# Patient Record
Sex: Female | Born: 1965 | Race: White | Hispanic: No | Marital: Single | Smoking: Current every day smoker
Health system: Southern US, Community
[De-identification: ages and names within clinical notes are randomized; demographics above are authoritative.]

## PROBLEM LIST (undated history)

## (undated) DIAGNOSIS — N390 Urinary tract infection, site not specified: Secondary | ICD-10-CM

## (undated) DIAGNOSIS — Z87442 Personal history of urinary calculi: Secondary | ICD-10-CM

## (undated) DIAGNOSIS — F411 Generalized anxiety disorder: Secondary | ICD-10-CM

## (undated) DIAGNOSIS — R112 Nausea with vomiting, unspecified: Secondary | ICD-10-CM

## (undated) DIAGNOSIS — E1140 Type 2 diabetes mellitus with diabetic neuropathy, unspecified: Secondary | ICD-10-CM

## (undated) DIAGNOSIS — G459 Transient cerebral ischemic attack, unspecified: Secondary | ICD-10-CM

## (undated) DIAGNOSIS — L439 Lichen planus, unspecified: Secondary | ICD-10-CM

## (undated) DIAGNOSIS — N189 Chronic kidney disease, unspecified: Secondary | ICD-10-CM

## (undated) DIAGNOSIS — N760 Acute vaginitis: Secondary | ICD-10-CM

## (undated) DIAGNOSIS — IMO0001 Reserved for inherently not codable concepts without codable children: Secondary | ICD-10-CM

## (undated) DIAGNOSIS — J189 Pneumonia, unspecified organism: Secondary | ICD-10-CM

## (undated) DIAGNOSIS — M25559 Pain in unspecified hip: Secondary | ICD-10-CM

## (undated) DIAGNOSIS — E1165 Type 2 diabetes mellitus with hyperglycemia: Secondary | ICD-10-CM

## (undated) DIAGNOSIS — E785 Hyperlipidemia, unspecified: Secondary | ICD-10-CM

## (undated) DIAGNOSIS — I639 Cerebral infarction, unspecified: Secondary | ICD-10-CM

## (undated) DIAGNOSIS — G629 Polyneuropathy, unspecified: Secondary | ICD-10-CM

## (undated) DIAGNOSIS — N183 Chronic kidney disease, stage 3 unspecified: Secondary | ICD-10-CM

## (undated) DIAGNOSIS — R51 Headache: Secondary | ICD-10-CM

## (undated) DIAGNOSIS — A6000 Herpesviral infection of urogenital system, unspecified: Secondary | ICD-10-CM

## (undated) DIAGNOSIS — G709 Myoneural disorder, unspecified: Secondary | ICD-10-CM

## (undated) DIAGNOSIS — Z9889 Other specified postprocedural states: Secondary | ICD-10-CM

## (undated) HISTORY — DX: Urinary tract infection, site not specified: N39.0

## (undated) HISTORY — DX: Chronic kidney disease, stage 3 unspecified: N18.30

## (undated) HISTORY — DX: Type 2 diabetes mellitus with hyperglycemia: E11.65

## (undated) HISTORY — DX: Lichen planus, unspecified: L43.9

## (undated) HISTORY — DX: Polyneuropathy, unspecified: G62.9

## (undated) HISTORY — DX: Hyperlipidemia, unspecified: E78.5

## (undated) HISTORY — PX: COLONOSCOPY: SHX174

## (undated) HISTORY — DX: Pain in unspecified hip: M25.559

## (undated) HISTORY — PX: VASCULAR SURGERY: SHX849

## (undated) HISTORY — PX: ABDOMINAL HYSTERECTOMY: SHX81

## (undated) HISTORY — DX: Headache: R51

## (undated) HISTORY — DX: Acute vaginitis: N76.0

## (undated) HISTORY — PX: EYE SURGERY: SHX253

## (undated) HISTORY — DX: Herpesviral infection of urogenital system, unspecified: A60.00

## (undated) HISTORY — DX: Cerebral infarction, unspecified: I63.9

## (undated) HISTORY — DX: Transient cerebral ischemic attack, unspecified: G45.9

## (undated) HISTORY — DX: Chronic kidney disease, unspecified: N18.9

## (undated) HISTORY — DX: Generalized anxiety disorder: F41.1

## (undated) HISTORY — DX: Reserved for inherently not codable concepts without codable children: IMO0001

---
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
## 1989-07-19 HISTORY — PX: CHOLECYSTECTOMY: SHX55

## 1989-07-19 HISTORY — PX: TUBAL LIGATION: SHX77

## 1999-07-20 HISTORY — PX: TOTAL VAGINAL HYSTERECTOMY: SHX2548

## 2005-03-25 ENCOUNTER — Ambulatory Visit: Payer: Self-pay | Admitting: Anesthesiology

## 2005-11-04 ENCOUNTER — Emergency Department: Payer: Self-pay | Admitting: Emergency Medicine

## 2009-11-26 ENCOUNTER — Emergency Department: Payer: Self-pay | Admitting: Emergency Medicine

## 2011-02-16 ENCOUNTER — Emergency Department: Payer: Self-pay | Admitting: Emergency Medicine

## 2011-08-27 ENCOUNTER — Emergency Department: Payer: Self-pay | Admitting: Internal Medicine

## 2012-06-22 ENCOUNTER — Emergency Department: Payer: Self-pay | Admitting: Internal Medicine

## 2012-07-04 ENCOUNTER — Emergency Department: Payer: Self-pay | Admitting: Emergency Medicine

## 2012-07-04 LAB — RAPID INFLUENZA A&B ANTIGENS

## 2012-07-06 ENCOUNTER — Emergency Department: Payer: Self-pay | Admitting: Emergency Medicine

## 2012-07-06 LAB — CBC
HGB: 14.9 g/dL (ref 12.0–16.0)
MCH: 31.4 pg (ref 26.0–34.0)
MCHC: 34.6 g/dL (ref 32.0–36.0)
MCV: 91 fL (ref 80–100)
RBC: 4.74 10*6/uL (ref 3.80–5.20)

## 2012-07-06 LAB — BASIC METABOLIC PANEL
Anion Gap: 4 — ABNORMAL LOW (ref 7–16)
Co2: 27 mmol/L (ref 21–32)
Creatinine: 0.94 mg/dL (ref 0.60–1.30)
EGFR (African American): >60
EGFR (Non-African Amer.): >60
Osmolality: 283 (ref 275–301)
Sodium: 139 mmol/L (ref 136–145)

## 2012-11-21 ENCOUNTER — Ambulatory Visit: Payer: Self-pay | Admitting: Family Medicine

## 2012-12-22 ENCOUNTER — Emergency Department: Payer: Self-pay | Admitting: Emergency Medicine

## 2012-12-22 LAB — COMPREHENSIVE METABOLIC PANEL
Albumin: 3.4 g/dL (ref 3.4–5.0)
Anion Gap: 5 — ABNORMAL LOW (ref 7–16)
BUN: 15 mg/dL (ref 7–18)
Bilirubin,Total: 0.4 mg/dL (ref 0.2–1.0)
Calcium, Total: 8.7 mg/dL (ref 8.5–10.1)
Co2: 27 mmol/L (ref 21–32)
Creatinine: 1.12 mg/dL (ref 0.60–1.30)
Glucose: 358 mg/dL — ABNORMAL HIGH (ref 65–99)
Osmolality: 283 (ref 275–301)
Potassium: 3.8 mmol/L (ref 3.5–5.1)
SGOT(AST): 16 U/L (ref 15–37)
SGPT (ALT): 21 U/L (ref 12–78)
Total Protein: 7.9 g/dL (ref 6.4–8.2)

## 2012-12-22 LAB — CBC
HCT: 42.9 % (ref 35.0–47.0)
HGB: 15 g/dL (ref 12.0–16.0)
MCHC: 35.1 g/dL (ref 32.0–36.0)
MCV: 91 fL (ref 80–100)
Platelet: 220 10*3/uL (ref 150–440)
RDW: 12.5 % (ref 11.5–14.5)
WBC: 10.7 10*3/uL (ref 3.6–11.0)

## 2012-12-25 ENCOUNTER — Emergency Department: Payer: Self-pay | Admitting: Emergency Medicine

## 2013-02-06 ENCOUNTER — Ambulatory Visit (INDEPENDENT_AMBULATORY_CARE_PROVIDER_SITE_OTHER): Payer: BC Managed Care – PPO | Admitting: Neurology

## 2013-02-06 ENCOUNTER — Encounter: Payer: Self-pay | Admitting: Neurology

## 2013-02-06 VITALS — BP 126/76 | HR 98 | Temp 97.6°F | Ht 63.5 in | Wt 178.0 lb

## 2013-02-06 DIAGNOSIS — R51 Headache: Secondary | ICD-10-CM

## 2013-02-06 DIAGNOSIS — R0609 Other forms of dyspnea: Secondary | ICD-10-CM

## 2013-02-06 DIAGNOSIS — R519 Headache, unspecified: Secondary | ICD-10-CM | POA: Insufficient documentation

## 2013-02-06 DIAGNOSIS — R400 Somnolence: Secondary | ICD-10-CM

## 2013-02-06 DIAGNOSIS — G4710 Hypersomnia, unspecified: Secondary | ICD-10-CM

## 2013-02-06 DIAGNOSIS — R0683 Snoring: Secondary | ICD-10-CM

## 2013-02-06 DIAGNOSIS — G43009 Migraine without aura, not intractable, without status migrainosus: Secondary | ICD-10-CM

## 2013-02-06 MED ORDER — AMITRIPTYLINE HCL 25 MG PO TABS: ORAL_TABLET | ORAL | Status: DC

## 2013-02-06 MED ORDER — PROMETHAZINE HCL 12.5 MG PO TABS: 12.5000 mg | ORAL_TABLET | Freq: Two times a day (BID) | ORAL | Status: DC | PRN

## 2013-02-06 NOTE — Patient Instructions (Signed)
Based on your symptoms and your exam I believe you are at risk for obstructive sleep apnea or OSA, and I think we should proceed with a sleep study to determine whether you do or do not have OSA and how severe it is. If you have more than mild OSA, I want you to consider treatment with CPAP. Please remember, the risks and ramifications of moderate to severe obstructive sleep apnea or OSA are: Cardiovascular disease, including congestive heart failure, stroke, difficult to control hypertension, arrhythmias, and even type 2 diabetes has been linked to untreated OSA. Sleep apnea causes disruption of sleep and sleep deprivation in most cases, which, in turn, can cause recurrent headaches, problems with memory, mood, concentration, focus, and vigilance. Most people with untreated sleep apnea report excessive daytime sleepiness, which can affect their ability to drive. Please do not drive if you feel sleepy.  I will see you back after your sleep study to go over the test results and where to go from there. We will call you after your sleep study and to set up an appointment at the time.   Please remember, common headache triggers are: sleep deprivation, dehydration, overheating, stress, hypoglycemia or skipping meals, excessive pain medications or excessive alcohol use. Some people have food triggers such as aged cheese, orange juice or chocolate, especially dark chocolate. Try to avoid these headache triggers as much possible. It may be helpful to keep a headache diary to figure out what makes your headaches worse or brings them on. Some people report headache onset after exercise but studies have shown that regular exercise may actually prevent headaches from coming on. If you have exercise-induced headaches, please make sure that you drink plenty of fluid before and after exercising and that you don't over do it.   We will try Elavil (amitriptyline) 25 mg: Take half a pill daily at bedtime for one week, then one  pill daily at bedtime for one week, then one and a half pills daily at bedtime for one week, then 2 pills daily at bedtime thereafter. Common side effects reported are: mouth dryness, drowsiness, confusion, dizziness.   For nausea you can take the Phenergan.
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
## 2013-02-11 ENCOUNTER — Encounter: Payer: BC Managed Care – PPO | Admitting: Neurology

## 2013-02-12 ENCOUNTER — Telehealth: Payer: Self-pay | Admitting: Neurology
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
## 2013-03-08 ENCOUNTER — Telehealth: Payer: Self-pay

## 2013-03-08 NOTE — Telephone Encounter (Signed)
I have completed the document request for [facility] and have left the patient a VM that we need a release and payment then we can fax the forms. We do have a [employer] release but not a [employer]. I will send documents up to medical records and they can fax them when they hear back from patient.

## 2013-04-20 ENCOUNTER — Emergency Department: Payer: Self-pay | Admitting: Emergency Medicine

## 2013-05-16 ENCOUNTER — Ambulatory Visit: Payer: Self-pay | Admitting: Family Medicine

## 2013-06-12 ENCOUNTER — Ambulatory Visit: Payer: Self-pay | Admitting: Family Medicine

## 2013-07-31 ENCOUNTER — Emergency Department: Payer: Self-pay | Admitting: Emergency Medicine

## 2014-01-14 ENCOUNTER — Ambulatory Visit: Payer: Self-pay

## 2014-05-28 ENCOUNTER — Emergency Department: Payer: Self-pay | Admitting: Emergency Medicine

## 2014-05-28 LAB — CBC WITH DIFFERENTIAL/PLATELET
Basophil #: 0.1 10*3/uL (ref 0.0–0.1)
Basophil %: 0.6 %
EOS ABS: 0.3 10*3/uL (ref 0.0–0.7)
Eosinophil %: 2.7 %
HCT: 45.4 % (ref 35.0–47.0)
HGB: 15.3 g/dL (ref 12.0–16.0)
LYMPHS PCT: 23.2 %
Lymphocyte #: 2.7 10*3/uL (ref 1.0–3.6)
MCH: 31.6 pg (ref 26.0–34.0)
MCHC: 33.7 g/dL (ref 32.0–36.0)
MCV: 94 fL (ref 80–100)
Monocyte #: 0.7 x10 3/mm (ref 0.2–0.9)
Monocyte %: 6.5 %
Neutrophil #: 7.7 10*3/uL — ABNORMAL HIGH (ref 1.4–6.5)
Neutrophil %: 67 %
Platelet: 246 10*3/uL (ref 150–440)
RBC: 4.84 10*6/uL (ref 3.80–5.20)
RDW: 12.7 % (ref 11.5–14.5)
WBC: 11.5 10*3/uL — ABNORMAL HIGH (ref 3.6–11.0)

## 2014-05-28 LAB — URINALYSIS, COMPLETE
BLOOD: NEGATIVE
Bilirubin,UR: NEGATIVE
Glucose,UR: >=500 mg/dL (ref 0–75)
KETONE: NEGATIVE
Nitrite: POSITIVE
PH: 5 (ref 4.5–8.0)
PROTEIN: NEGATIVE
RBC,UR: 2 /HPF (ref 0–5)
Specific Gravity: 1.03 (ref 1.003–1.030)
Squamous Epithelial: 5
WBC UR: 12 /HPF (ref 0–5)

## 2014-05-29 LAB — COMPREHENSIVE METABOLIC PANEL
ALK PHOS: 121 U/L — AB
ANION GAP: 6 — AB (ref 7–16)
Albumin: 3.4 g/dL (ref 3.4–5.0)
BUN: 9 mg/dL (ref 7–18)
Bilirubin,Total: 0.4 mg/dL (ref 0.2–1.0)
CALCIUM: 8.8 mg/dL (ref 8.5–10.1)
CHLORIDE: 102 mmol/L (ref 98–107)
CO2: 32 mmol/L (ref 21–32)
Creatinine: 1.12 mg/dL (ref 0.60–1.30)
EGFR (African American): >60
EGFR (Non-African Amer.): 55 — ABNORMAL LOW
Glucose: 293 mg/dL — ABNORMAL HIGH (ref 65–99)
Osmolality: 289 (ref 275–301)
POTASSIUM: 3.9 mmol/L (ref 3.5–5.1)
SGOT(AST): 7 U/L — ABNORMAL LOW (ref 15–37)
SGPT (ALT): 20 U/L
SODIUM: 140 mmol/L (ref 136–145)
Total Protein: 8 g/dL (ref 6.4–8.2)

## 2014-11-02 ENCOUNTER — Inpatient Hospital Stay
Admit: 2014-11-02 | Disposition: A | Discharge status: Home / Self Care | Payer: Self-pay | Attending: Internal Medicine | Admitting: Internal Medicine

## 2014-11-02 LAB — BASIC METABOLIC PANEL
Anion Gap: 8 (ref 7–16)
BUN: 17 mg/dL
CREATININE: 0.93 mg/dL
Calcium, Total: 9 mg/dL
Chloride: 100 mmol/L — ABNORMAL LOW
Co2: 28 mmol/L
EGFR (African American): >60
GLUCOSE: 406 mg/dL — AB
POTASSIUM: 4.4 mmol/L
SODIUM: 136 mmol/L

## 2014-11-02 LAB — CK-MB
CK-MB: 1.6 ng/mL
CK-MB: 1.9 ng/mL
CK-MB: 1.4 ng/mL

## 2014-11-02 LAB — TROPONIN I
Troponin-I: <0.03 ng/mL
Troponin-I: <0.03 ng/mL

## 2014-11-02 LAB — CBC
HCT: 41.7 % (ref 35.0–47.0)
HGB: 14.4 g/dL (ref 12.0–16.0)
MCH: 31.3 pg (ref 26.0–34.0)
MCHC: 34.6 g/dL (ref 32.0–36.0)
MCV: 90 fL (ref 80–100)
Platelet: 222 10*3/uL (ref 150–440)
RBC: 4.62 10*6/uL (ref 3.80–5.20)
RDW: 12.5 % (ref 11.5–14.5)
WBC: 9.8 10*3/uL (ref 3.6–11.0)

## 2014-11-02 LAB — URINALYSIS, COMPLETE
BLOOD: NEGATIVE
Bilirubin,UR: NEGATIVE
Ketone: NEGATIVE
LEUKOCYTE ESTERASE: NEGATIVE
NITRITE: POSITIVE
Ph: 5 (ref 4.5–8.0)
Protein: NEGATIVE
Specific Gravity: 1.027 (ref 1.003–1.030)

## 2014-11-02 LAB — SEDIMENTATION RATE: ERYTHROCYTE SED RATE: 55 mm/h — AB (ref 0–20)

## 2014-11-03 LAB — CBC WITH DIFFERENTIAL/PLATELET
Basophil #: 0.1 10*3/uL (ref 0.0–0.1)
Basophil %: 0.6 %
EOS PCT: 3.7 %
Eosinophil #: 0.4 10*3/uL (ref 0.0–0.7)
HCT: 39.3 % (ref 35.0–47.0)
HGB: 13.3 g/dL (ref 12.0–16.0)
LYMPHS ABS: 3.3 10*3/uL (ref 1.0–3.6)
Lymphocyte %: 33.6 %
MCH: 30.6 pg (ref 26.0–34.0)
MCHC: 33.9 g/dL (ref 32.0–36.0)
MCV: 90 fL (ref 80–100)
MONO ABS: 0.7 x10 3/mm (ref 0.2–0.9)
Monocyte %: 7.4 %
Neutrophil #: 5.3 10*3/uL (ref 1.4–6.5)
Neutrophil %: 54.7 %
PLATELETS: 213 10*3/uL (ref 150–440)
RBC: 4.36 10*6/uL (ref 3.80–5.20)
RDW: 12.3 % (ref 11.5–14.5)
WBC: 9.7 10*3/uL (ref 3.6–11.0)

## 2014-11-03 LAB — BASIC METABOLIC PANEL
Anion Gap: 2 — ABNORMAL LOW (ref 7–16)
BUN: 18 mg/dL
CO2: 29 mmol/L
CREATININE: 0.87 mg/dL
Calcium, Total: 8 mg/dL — ABNORMAL LOW
Chloride: 105 mmol/L
Glucose: 281 mg/dL — ABNORMAL HIGH
POTASSIUM: 3.9 mmol/L
Sodium: 136 mmol/L

## 2014-11-03 LAB — LIPID PANEL
CHOLESTEROL: 211 mg/dL — AB
HDL: 23 mg/dL — AB
TRIGLYCERIDES: 440 mg/dL — AB

## 2014-11-03 LAB — HEMOGLOBIN A1C: Hemoglobin A1C: 12 % — ABNORMAL HIGH
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
## 2015-10-24 ENCOUNTER — Encounter: Payer: Self-pay | Admitting: Emergency Medicine

## 2015-10-24 ENCOUNTER — Emergency Department
Admission: EM | Admit: 2015-10-24 | Discharge: 2015-10-24 | Disposition: A | Discharge status: Home / Self Care | Payer: Self-pay | Attending: Emergency Medicine | Admitting: Emergency Medicine

## 2015-10-24 DIAGNOSIS — Z794 Long term (current) use of insulin: Secondary | ICD-10-CM | POA: Insufficient documentation

## 2015-10-24 DIAGNOSIS — E119 Type 2 diabetes mellitus without complications: Secondary | ICD-10-CM | POA: Insufficient documentation

## 2015-10-24 DIAGNOSIS — S7001XA Contusion of right hip, initial encounter: Secondary | ICD-10-CM | POA: Insufficient documentation

## 2015-10-24 DIAGNOSIS — Z79899 Other long term (current) drug therapy: Secondary | ICD-10-CM | POA: Insufficient documentation

## 2015-10-24 DIAGNOSIS — H109 Unspecified conjunctivitis: Secondary | ICD-10-CM | POA: Insufficient documentation

## 2015-10-24 DIAGNOSIS — E785 Hyperlipidemia, unspecified: Secondary | ICD-10-CM | POA: Insufficient documentation

## 2015-10-24 DIAGNOSIS — Z7984 Long term (current) use of oral hypoglycemic drugs: Secondary | ICD-10-CM | POA: Insufficient documentation

## 2015-10-24 DIAGNOSIS — W108XXA Fall (on) (from) other stairs and steps, initial encounter: Secondary | ICD-10-CM | POA: Insufficient documentation

## 2015-10-24 DIAGNOSIS — F419 Anxiety disorder, unspecified: Secondary | ICD-10-CM | POA: Insufficient documentation

## 2015-10-24 DIAGNOSIS — Y9389 Activity, other specified: Secondary | ICD-10-CM | POA: Insufficient documentation

## 2015-10-24 DIAGNOSIS — Z87891 Personal history of nicotine dependence: Secondary | ICD-10-CM | POA: Insufficient documentation

## 2015-10-24 DIAGNOSIS — Y998 Other external cause status: Secondary | ICD-10-CM | POA: Insufficient documentation

## 2015-10-24 DIAGNOSIS — Y9289 Other specified places as the place of occurrence of the external cause: Secondary | ICD-10-CM | POA: Insufficient documentation

## 2015-10-24 DIAGNOSIS — W19XXXA Unspecified fall, initial encounter: Secondary | ICD-10-CM

## 2015-10-24 DIAGNOSIS — S40011A Contusion of right shoulder, initial encounter: Secondary | ICD-10-CM | POA: Insufficient documentation

## 2015-10-24 MED ORDER — NAPROXEN 500 MG PO TABS: 500.0000 mg | ORAL_TABLET | Freq: Two times a day (BID) | ORAL | Status: DC

## 2015-10-24 MED ORDER — SULFACETAMIDE SODIUM 10 % OP SOLN: 2.0000 [drp] | Freq: Four times a day (QID) | OPHTHALMIC | Status: DC

## 2015-10-24 MED ORDER — CYCLOBENZAPRINE HCL 10 MG PO TABS: 10.0000 mg | ORAL_TABLET | Freq: Three times a day (TID) | ORAL | Status: DC | PRN
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
## 2015-10-24 NOTE — ED Notes (Signed)
Pt in via triage with complaints of drainage from left eye for about three days, pt reports getting up last night and unable to open left eye, "tripped and stumbled down the stairs."  Pt denies hitting head, denies LOC.  Pt with complaints of pain to right shoulder, right hip, reports difficulty ambulating due to pain.  Pt left eye red, sensitive to light.

## 2015-10-24 NOTE — ED Notes (Signed)
Vision was blurry in left eye last night when pt woke up to bathroom, missed a step and fell on right shoulder/upper back and hip area. Pt walked to triage. Left eye red, "goopy" upon waking upon waking up this am.

## 2015-10-24 NOTE — Discharge Instructions (Signed)
Bacterial Conjunctivitis Bacterial conjunctivitis, commonly called pink eye, is an inflammation of the clear membrane that covers the white part of the eye (conjunctiva). The inflammation can also happen on the underside of the eyelids. The blood vessels in the conjunctiva become inflamed, causing the eye to become red or pink. Bacterial conjunctivitis may spread easily from one eye to another and from person to person (contagious).  CAUSES  Bacterial conjunctivitis is caused by bacteria. The bacteria may come from your own skin, your upper respiratory tract, or from someone else with bacterial conjunctivitis. SYMPTOMS  The normally white color of the eye or the underside of the eyelid is usually pink or red. The pink eye is usually associated with irritation, tearing, and some sensitivity to light. Bacterial conjunctivitis is often associated with a thick, yellowish discharge from the eye. The discharge may turn into a crust on the eyelids overnight, which causes your eyelids to stick together. If a discharge is present, there may also be some blurred vision in the affected eye. DIAGNOSIS  Bacterial conjunctivitis is diagnosed by your caregiver through an eye exam and the symptoms that you report. Your caregiver looks for changes in the surface tissues of your eyes, which may point to the specific type of conjunctivitis. A sample of any discharge may be collected on a cotton-tip swab if you have a severe case of conjunctivitis, if your cornea is affected, or if you keep getting repeat infections that do not respond to treatment. The sample will be sent to a lab to see if the inflammation is caused by a bacterial infection and to see if the infection will respond to antibiotic medicines. TREATMENT   Bacterial conjunctivitis is treated with antibiotics. Antibiotic eyedrops are most often used. However, antibiotic ointments are also available. Antibiotics pills are sometimes used. Artificial tears or eye  washes may ease discomfort. HOME CARE INSTRUCTIONS   To ease discomfort, apply a cool, clean washcloth to your eye for 10-20 minutes, 3-4 times a day.  Gently wipe away any drainage from your eye with a warm, wet washcloth or a cotton ball.  Wash your hands often with soap and water. Use paper towels to dry your hands.  Do not share towels or washcloths. This may spread the infection.  Change or wash your pillowcase every day.  You should not use eye makeup until the infection is gone.  Do not operate machinery or drive if your vision is blurred.  Stop using contact lenses. Ask your caregiver how to sterilize or replace your contacts before using them again. This depends on the type of contact lenses that you use.  When applying medicine to the infected eye, do not touch the edge of your eyelid with the eyedrop bottle or ointment tube. SEEK IMMEDIATE MEDICAL CARE IF:   Your infection has not improved within 3 days after beginning treatment.  You had yellow discharge from your eye and it returns.  You have increased eye pain.  Your eye redness is spreading.  Your vision becomes blurred.  You have a fever or persistent symptoms for more than 2-3 days.  You have a fever and your symptoms suddenly get worse.  You have facial pain, redness, or swelling. MAKE SURE YOU:   Understand these instructions.  Will watch your condition.  Will get help right away if you are not doing well or get worse.   This information is not intended to replace advice given to you by your health care provider. Make sure you   discuss any questions you have with your health care provider.   Document Released: 07/05/2005 Document Revised: 07/26/2014 Document Reviewed: 12/06/2011 Elsevier Interactive Patient Education 2016 [place] A contusion is a deep bruise. Contusions are the result of a blunt injury to tissues and muscle fibers under the skin. The injury causes bleeding under  the skin. The skin overlying the contusion may turn blue, purple, or yellow. Minor injuries will give you a painless contusion, but more severe contusions may stay painful and swollen for a few weeks.  CAUSES  This condition is usually caused by a blow, trauma, or direct force to an area of the body. SYMPTOMS  Symptoms of this condition include:  Swelling of the injured area.  Pain and tenderness in the injured area.  Discoloration. The area may have redness and then turn blue, purple, or yellow. DIAGNOSIS  This condition is diagnosed based on a physical exam and medical history. An X-ray, CT scan, or MRI may be needed to determine if there are any associated injuries, such as broken bones (fractures). TREATMENT  Specific treatment for this condition depends on what area of the body was injured. In general, the best treatment for a contusion is resting, icing, applying pressure to (compression), and elevating the injured area. This is often called the RICE strategy. Over-the-counter anti-inflammatory medicines may also be recommended for pain control.  HOME CARE INSTRUCTIONS   Rest the injured area.  If directed, apply ice to the injured area:  Put ice in a plastic bag.  Place a towel between your skin and the bag.  Leave the ice on for 20 minutes, 2-3 times per day.  If directed, apply light compression to the injured area using an elastic bandage. Make sure the bandage is not wrapped too tightly. Remove and reapply the bandage as directed by your health care provider.  If possible, raise (elevate) the injured area above the level of your heart while you are sitting or lying down.  Take over-the-counter and prescription medicines only as told by your health care provider. SEEK MEDICAL CARE IF:  Your symptoms do not improve after several days of treatment.  Your symptoms get worse.  You have difficulty moving the injured area. SEEK IMMEDIATE MEDICAL CARE IF:   You have severe  pain.  You have numbness in a hand or foot.  Your hand or foot turns pale or cold.   This information is not intended to replace advice given to you by your health care provider. Make sure you discuss any questions you have with your health care provider.   Document Released: 04/14/2005 Document Revised: 03/26/2015 Document Reviewed: 11/20/2014 Elsevier Interactive Patient Education 2016 [employer]  Iliac Crest Contusion  An iliac crest contusion is a deep bruise of your hip bone (hip pointer). Contusions happen when an injury causes bleeding under the skin. Signs of bruising include pain, puffiness (swelling), and discolored skin. The contusion may turn blue, purple, or yellow. HOME CARE   Put ice on the injured area.  Put ice in a plastic bag.  Place a towel between your skin and the bag.  Leave the ice on for 15-20 minutes, 03-04 times a day.  Only take medicines as told by your doctor.  Keep your leg straight (extended) when possible.  Walk and move around as pain allows, or as told by your doctor. Use crutches if you are told to do so.  Put on an elastic wrap as told by your doctor. You  can take it off for sleeping, showers, and baths. GET HELP RIGHT AWAY IF:  You have more bruising or puffiness.  You have pain that is getting worse.  Your puffiness or pain is not helped by medicines.  Your toes get cold. MAKE SURE YOU:   Understand these instructions.  Will watch your condition.  Will get help right away if you are not doing well or get worse.   This information is not intended to replace advice given to you by your health care provider. Make sure you discuss any questions you have with your health care provider.   Document Released: 06/24/2011 Document Revised: 01/04/2012 Document Reviewed: 11/20/2014 Elsevier Interactive Patient Education [employer].

## 2015-12-21 ENCOUNTER — Emergency Department: Payer: Self-pay

## 2015-12-21 ENCOUNTER — Emergency Department
Admission: EM | Admit: 2015-12-21 | Discharge: 2015-12-21 | Disposition: A | Discharge status: Home / Self Care | Payer: Self-pay | Attending: Emergency Medicine | Admitting: Emergency Medicine

## 2015-12-21 ENCOUNTER — Encounter: Payer: Self-pay | Admitting: Emergency Medicine

## 2015-12-21 DIAGNOSIS — E784 Other hyperlipidemia: Secondary | ICD-10-CM | POA: Insufficient documentation

## 2015-12-21 DIAGNOSIS — IMO0002 Reserved for concepts with insufficient information to code with codable children: Secondary | ICD-10-CM

## 2015-12-21 DIAGNOSIS — Z79899 Other long term (current) drug therapy: Secondary | ICD-10-CM | POA: Insufficient documentation

## 2015-12-21 DIAGNOSIS — E119 Type 2 diabetes mellitus without complications: Secondary | ICD-10-CM | POA: Insufficient documentation

## 2015-12-21 DIAGNOSIS — E138 Other specified diabetes mellitus with unspecified complications: Secondary | ICD-10-CM

## 2015-12-21 DIAGNOSIS — Z87891 Personal history of nicotine dependence: Secondary | ICD-10-CM | POA: Insufficient documentation

## 2015-12-21 DIAGNOSIS — E1365 Other specified diabetes mellitus with hyperglycemia: Secondary | ICD-10-CM

## 2015-12-21 DIAGNOSIS — Z794 Long term (current) use of insulin: Secondary | ICD-10-CM | POA: Insufficient documentation

## 2015-12-21 DIAGNOSIS — Z7984 Long term (current) use of oral hypoglycemic drugs: Secondary | ICD-10-CM | POA: Insufficient documentation

## 2015-12-21 DIAGNOSIS — L03115 Cellulitis of right lower limb: Secondary | ICD-10-CM | POA: Insufficient documentation

## 2015-12-21 LAB — CBC WITH DIFFERENTIAL/PLATELET
BASOS ABS: 0.1 10*3/uL (ref 0–0.1)
Basophils Relative: 1 %
EOS ABS: 0.3 10*3/uL (ref 0–0.7)
HEMATOCRIT: 38.1 % (ref 35.0–47.0)
HEMOGLOBIN: 13.1 g/dL (ref 12.0–16.0)
LYMPHS ABS: 2.6 10*3/uL (ref 1.0–3.6)
Lymphocytes Relative: 22 %
MCH: 30.9 pg (ref 26.0–34.0)
MCHC: 34.6 g/dL (ref 32.0–36.0)
MCV: 89.5 fL (ref 80.0–100.0)
Monocytes Absolute: 0.8 10*3/uL (ref 0.2–0.9)
Monocytes Relative: 7 %
Neutro Abs: 8 10*3/uL — ABNORMAL HIGH (ref 1.4–6.5)
Platelets: 262 10*3/uL (ref 150–440)
RBC: 4.25 MIL/uL (ref 3.80–5.20)
RDW: 12.4 % (ref 11.5–14.5)
WBC: 11.8 10*3/uL — AB (ref 3.6–11.0)

## 2015-12-21 LAB — COMPREHENSIVE METABOLIC PANEL
ALT: 14 U/L (ref 14–54)
AST: 15 U/L (ref 15–41)
Albumin: 3.8 g/dL (ref 3.5–5.0)
Alkaline Phosphatase: 86 U/L (ref 38–126)
Anion gap: 8 (ref 5–15)
BILIRUBIN TOTAL: 0.4 mg/dL (ref 0.3–1.2)
BUN: 15 mg/dL (ref 6–20)
CHLORIDE: 101 mmol/L (ref 101–111)
CO2: 26 mmol/L (ref 22–32)
CREATININE: 0.97 mg/dL (ref 0.44–1.00)
Calcium: 9.2 mg/dL (ref 8.9–10.3)
GFR calc Af Amer: >60 mL/min (ref >60)
Glucose, Bld: 417 mg/dL — ABNORMAL HIGH (ref 65–99)
POTASSIUM: 4.2 mmol/L (ref 3.5–5.1)
Sodium: 135 mmol/L (ref 135–145)
TOTAL PROTEIN: 8 g/dL (ref 6.5–8.1)

## 2015-12-21 LAB — URIC ACID: URIC ACID, SERUM: 4.4 mg/dL (ref 2.3–6.6)

## 2015-12-21 LAB — GLUCOSE, CAPILLARY: Glucose-Capillary: 351 mg/dL — ABNORMAL HIGH (ref 65–99)

## 2015-12-21 MED ORDER — SODIUM CHLORIDE 0.9 % IV BOLUS (SEPSIS)
1000.0000 mL | Freq: Once | INTRAVENOUS | Status: AC
  Administered 2015-12-21: 1000 mL via INTRAVENOUS

## 2015-12-21 MED ORDER — IBUPROFEN 600 MG PO TABS: 600.0000 mg | ORAL_TABLET | Freq: Four times a day (QID) | ORAL | Status: DC | PRN

## 2015-12-21 MED ORDER — CLINDAMYCIN PHOSPHATE 600 MG/50ML IV SOLN
600.0000 mg | Freq: Once | INTRAVENOUS | Status: AC
  Administered 2015-12-21: 600 mg via INTRAVENOUS
  Filled 2015-12-21: qty 50

## 2015-12-21 MED ORDER — CLINDAMYCIN HCL 300 MG PO CAPS: 300.0000 mg | ORAL_CAPSULE | Freq: Three times a day (TID) | ORAL | Status: DC

## 2015-12-21 NOTE — Discharge Instructions (Signed)
Cellulitis Cellulitis is an infection of the skin and the tissue beneath it. The infected area is usually red and tender. Cellulitis occurs most often in the arms and lower legs.  CAUSES  Cellulitis is caused by bacteria that enter the skin through cracks or cuts in the skin. The most common types of bacteria that cause cellulitis are staphylococci and streptococci. SIGNS AND SYMPTOMS   Redness and warmth.  Swelling.  Tenderness or pain.  Fever. DIAGNOSIS  Your health care provider can usually determine what is wrong based on a physical exam. Blood tests may also be done. TREATMENT  Treatment usually involves taking an antibiotic medicine. HOME CARE INSTRUCTIONS   Take your antibiotic medicine as directed by your health care provider. Finish the antibiotic even if you start to feel better.  Keep the infected arm or leg elevated to reduce swelling.  Apply a warm cloth to the affected area up to 4 times per day to relieve pain.  Take medicines only as directed by your health care provider.  Keep all follow-up visits as directed by your health care provider. SEEK MEDICAL CARE IF:   You notice red streaks coming from the infected area.  Your red area gets larger or turns dark in color.  Your bone or joint underneath the infected area becomes painful after the skin has healed.  Your infection returns in the same area or another area.  You notice a swollen bump in the infected area.  You develop new symptoms.  You have a fever. SEEK IMMEDIATE MEDICAL CARE IF:   You feel very sleepy.  You develop vomiting or diarrhea.  You have a general ill feeling (malaise) with muscle aches and pains.   This information is not intended to replace advice given to you by your health care provider. Make sure you discuss any questions you have with your health care provider.   Document Released: 04/14/2005 Document Revised: 03/26/2015 Document Reviewed: 09/20/2011 Elsevier Interactive  Patient Education 2016 [employer].  Diabetes Mellitus and Food It is important for you to manage your blood sugar (glucose) level. Your blood glucose level can be greatly affected by what you eat. Eating healthier foods in the appropriate amounts throughout the day at about the same time each day will help you control your blood glucose level. It can also help slow or prevent worsening of your diabetes mellitus. Healthy eating may even help you improve the level of your blood pressure and reach or maintain a healthy weight.  General recommendations for healthful eating and cooking habits include:  Eating meals and snacks regularly. Avoid going long periods of time without eating to lose weight.  Eating a diet that consists mainly of plant-based foods, such as fruits, vegetables, nuts, legumes, and whole grains.  Using low-heat cooking methods, such as baking, instead of high-heat cooking methods, such as deep frying. Work with your dietitian to make sure you understand how to use the Nutrition Facts information on food labels. HOW CAN FOOD AFFECT ME? Carbohydrates Carbohydrates affect your blood glucose level more than any other type of food. Your dietitian will help you determine how many carbohydrates to eat at each meal and teach you how to count carbohydrates. Counting carbohydrates is important to keep your blood glucose at a healthy level, especially if you are using insulin or taking certain medicines for diabetes mellitus. Alcohol Alcohol can cause sudden decreases in blood glucose (hypoglycemia), especially if you use insulin or take certain medicines for diabetes mellitus. Hypoglycemia can  be a life-threatening condition. Symptoms of hypoglycemia (sleepiness, dizziness, and disorientation) are similar to symptoms of having too much alcohol.  If your health care provider has given you approval to drink alcohol, do so in moderation and use the following guidelines:  Women should not  have more than one drink per day, and men should not have more than two drinks per day. One drink is equal to:  12 oz of beer.  5 oz of wine.  1 oz of hard liquor.  Do not drink on an empty stomach.  Keep yourself hydrated. Have water, diet soda, or unsweetened iced tea.  Regular soda, juice, and other mixers might contain a lot of carbohydrates and should be counted. WHAT FOODS ARE NOT RECOMMENDED? As you make food choices, it is important to remember that all foods are not the same. Some foods have fewer nutrients per serving than other foods, even though they might have the same number of calories or carbohydrates. It is difficult to get your body what it needs when you eat foods with fewer nutrients. Examples of foods that you should avoid that are high in calories and carbohydrates but low in nutrients include:  Trans fats (most processed foods list trans fats on the Nutrition Facts label).  Regular soda.  Juice.  Candy.  Sweets, such as cake, pie, doughnuts, and cookies.  Fried foods. WHAT FOODS CAN I EAT? Eat nutrient-rich foods, which will nourish your body and keep you healthy. The food you should eat also will depend on several factors, including:  The calories you need.  The medicines you take.  Your weight.  Your blood glucose level.  Your blood pressure level.  Your cholesterol level. You should eat a variety of foods, including:  Protein.  Lean cuts of meat.  Proteins low in saturated fats, such as fish, egg whites, and beans. Avoid processed meats.  Fruits and vegetables.  Fruits and vegetables that may help control blood glucose levels, such as apples, mangoes, and yams.  Dairy products.  Choose fat-free or low-fat dairy products, such as milk, yogurt, and cheese.  Grains, bread, pasta, and rice.  Choose whole grain products, such as multigrain bread, whole oats, and brown rice. These foods may help control blood pressure.  Fats.  Foods  containing healthful fats, such as nuts, avocado, olive oil, canola oil, and fish. DOES EVERYONE WITH DIABETES MELLITUS HAVE THE SAME MEAL PLAN? Because every person with diabetes mellitus is different, there is not one meal plan that works for everyone. It is very important that you meet with a dietitian who will help you create a meal plan that is just right for you.   This information is not intended to replace advice given to you by your health care provider. Make sure you discuss any questions you have with your health care provider.   Document Released: 04/01/2005 Document Revised: 07/26/2014 Document Reviewed: 06/01/2013 Elsevier Interactive Patient Education 2016 [employer].  Diabetes and Sick Day Management Blood sugar (glucose) can be more difficult to control when you are sick. Colds, fever, flu, nausea, vomiting, and diarrhea are all examples of common illnesses that can cause problems for people with diabetes. Loss of body fluids (dehydration) from fever, vomiting, diarrhea, infection, and the stress of a sickness can all cause blood glucose levels to increase. Because of this, it is very important to take your diabetes medicines and to eat some form of carbohydrate food when you are sick. Liquid or soft foods are often  tolerated, and they help to replace fluids. HOME CARE INSTRUCTIONS These main guidelines are intended for managing a short-term (24 hours or less) sickness:  Take your usual dose of insulin or oral diabetes medicine. An exception would be if you take any form of metformin. If you cannot eat or drink, you can become dehydrated and should not take this medicine.  Continue to take your insulin even if you are unable to eat solid foods or are vomiting. Your insulin dose may stay the same, or it may need to be increased when you are sick.  You will need to test your blood glucose more often, generally every 2-4 hours. If you have type 1 diabetes, test your urine for  ketones every 4 hours. If you have type 2 diabetes, test your urine for ketones as directed by your health care provider.  Eat some form of food that contains carbohydrates. The carbohydrates can be in solid or liquid form. You should eat 45-50 g of carbohydrates every 3-4 hours.  Replace fluids if you have a fever, vomit, or have diarrhea. Ask your health care provider for specific rehydration instructions.  Watch carefully for the signs of ketoacidosis if you have type 1 diabetes. Call your health care provider if any of the following symptoms are present, especially in children:  Moderate to large ketones in the urine along with a high blood glucose level.  Severe nausea.  Vomiting.  Diarrhea.  Abdominal pain.  Rapid breathing.  Drink extra liquids that do not contain sugar such as water.  Be careful with over-the-counter medicines. Read the labels. They may contain sugar or types of sugars that can increase your blood glucose level. Food Choices for Illness All of the food choices below contain about 15 g of carbohydrates. Plan ahead and keep some of these foods around.    to  cup carbonated beverage containing sugar. Carbonated beverages will usually be better tolerated if they are opened and left at room temperature for a few minutes.   of a twin frozen ice pop.   cup regular gelatin.   cup juice.   cup ice cream or frozen yogurt.   cup cooked cereal.   cup sherbet.  1 cup clear broth or soup.  1 cup cream soup.   cup regular custard.   cup regular pudding.  1 cup sports drink.  1 cup plain yogurt.  1 slice toast.  6 squares saltine crackers.  5 vanilla wafers. SEEK MEDICAL CARE IF:   You are unable to drink fluids, even small amounts.  You have nausea and vomiting for more than 6 hours.  You have diarrhea for more than 6 hours.  Your blood glucose level is more than 240 mg/dL, even with additional insulin.  There is a change in  mental status.  You develop an additional serious sickness.  You have been sick for 2 days and are not getting better.  You have a fever. SEEK IMMEDIATE MEDICAL CARE IF:  You have difficulty breathing.  You have moderate to large ketone levels. MAKE SURE YOU:  Understand these instructions.  Will watch your condition.  Will get help right away if you are not doing well or get worse.   This information is not intended to replace advice given to you by your health care provider. Make sure you discuss any questions you have with your health care provider.   Document Released: 07/08/2003 Document Revised: 07/26/2014 Document Reviewed: 12/12/2012 Elsevier Interactive Patient Education [employer].

## 2015-12-21 NOTE — ED Notes (Signed)
See triage noted   Developed pain to right foot about 1-2 weeks ago  Pain started at 4 th and 5th toes and is moving into foot. Min swelling noted to foot  Denies any injry

## 2015-12-21 NOTE — ED Notes (Signed)
Pt states foot pain to right foot started 1 week ago started in toes moved up to foot.
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
## 2015-12-25 ENCOUNTER — Emergency Department: Payer: Self-pay

## 2015-12-25 ENCOUNTER — Emergency Department
Admission: EM | Admit: 2015-12-25 | Discharge: 2015-12-25 | Disposition: A | Discharge status: Home / Self Care | Payer: Self-pay | Attending: Emergency Medicine | Admitting: Emergency Medicine

## 2015-12-25 ENCOUNTER — Encounter: Payer: Self-pay | Admitting: *Deleted

## 2015-12-25 DIAGNOSIS — Y929 Unspecified place or not applicable: Secondary | ICD-10-CM | POA: Insufficient documentation

## 2015-12-25 DIAGNOSIS — Z7984 Long term (current) use of oral hypoglycemic drugs: Secondary | ICD-10-CM | POA: Insufficient documentation

## 2015-12-25 DIAGNOSIS — Y999 Unspecified external cause status: Secondary | ICD-10-CM | POA: Insufficient documentation

## 2015-12-25 DIAGNOSIS — S91301D Unspecified open wound, right foot, subsequent encounter: Secondary | ICD-10-CM | POA: Insufficient documentation

## 2015-12-25 DIAGNOSIS — E13628 Other specified diabetes mellitus with other skin complications: Secondary | ICD-10-CM | POA: Insufficient documentation

## 2015-12-25 DIAGNOSIS — X58XXXA Exposure to other specified factors, initial encounter: Secondary | ICD-10-CM | POA: Insufficient documentation

## 2015-12-25 DIAGNOSIS — Z794 Long term (current) use of insulin: Secondary | ICD-10-CM | POA: Insufficient documentation

## 2015-12-25 DIAGNOSIS — Z79899 Other long term (current) drug therapy: Secondary | ICD-10-CM | POA: Insufficient documentation

## 2015-12-25 DIAGNOSIS — Z87891 Personal history of nicotine dependence: Secondary | ICD-10-CM | POA: Insufficient documentation

## 2015-12-25 DIAGNOSIS — L03115 Cellulitis of right lower limb: Secondary | ICD-10-CM | POA: Insufficient documentation

## 2015-12-25 DIAGNOSIS — Y939 Activity, unspecified: Secondary | ICD-10-CM | POA: Insufficient documentation

## 2015-12-25 DIAGNOSIS — E785 Hyperlipidemia, unspecified: Secondary | ICD-10-CM | POA: Insufficient documentation

## 2015-12-25 LAB — CBC WITH DIFFERENTIAL/PLATELET
Basophils Absolute: 0.1 10*3/uL (ref 0–0.1)
Basophils Relative: 1 %
EOS PCT: 4 %
Eosinophils Absolute: 0.3 10*3/uL (ref 0–0.7)
HCT: 39.1 % (ref 35.0–47.0)
Hemoglobin: 13.5 g/dL (ref 12.0–16.0)
LYMPHS ABS: 2.5 10*3/uL (ref 1.0–3.6)
LYMPHS PCT: 27 %
MCH: 30.7 pg (ref 26.0–34.0)
MCHC: 34.6 g/dL (ref 32.0–36.0)
MCV: 88.9 fL (ref 80.0–100.0)
MONO ABS: 0.6 10*3/uL (ref 0.2–0.9)
MONOS PCT: 7 %
Neutro Abs: 5.9 10*3/uL (ref 1.4–6.5)
Neutrophils Relative %: 61 %
PLATELETS: 256 10*3/uL (ref 150–440)
RBC: 4.4 MIL/uL (ref 3.80–5.20)
RDW: 12.5 % (ref 11.5–14.5)
WBC: 9.4 10*3/uL (ref 3.6–11.0)

## 2015-12-25 LAB — COMPREHENSIVE METABOLIC PANEL
ALBUMIN: 3.7 g/dL (ref 3.5–5.0)
ALK PHOS: 85 U/L (ref 38–126)
ALT: 14 U/L (ref 14–54)
AST: 15 U/L (ref 15–41)
Anion gap: 7 (ref 5–15)
BILIRUBIN TOTAL: 0.4 mg/dL (ref 0.3–1.2)
BUN: 18 mg/dL (ref 6–20)
CHLORIDE: 99 mmol/L — AB (ref 101–111)
CO2: 28 mmol/L (ref 22–32)
CREATININE: 0.98 mg/dL (ref 0.44–1.00)
Calcium: 9.1 mg/dL (ref 8.9–10.3)
GFR calc Af Amer: >60 mL/min (ref >60)
GFR calc non Af Amer: >60 mL/min (ref >60)
GLUCOSE: 375 mg/dL — AB (ref 65–99)
POTASSIUM: 5.2 mmol/L — AB (ref 3.5–5.1)
Sodium: 134 mmol/L — ABNORMAL LOW (ref 135–145)
Total Protein: 7.5 g/dL (ref 6.5–8.1)

## 2015-12-25 LAB — GLUCOSE, CAPILLARY
Glucose-Capillary: 359 mg/dL — ABNORMAL HIGH (ref 65–99)
Glucose-Capillary: 300 mg/dL — ABNORMAL HIGH (ref 65–99)

## 2015-12-25 MED ORDER — CIPROFLOXACIN HCL 500 MG PO TABS: 500.0000 mg | ORAL_TABLET | Freq: Two times a day (BID) | ORAL | Status: DC

## 2015-12-25 MED ORDER — MUPIROCIN CALCIUM 2 % EX CREA
TOPICAL_CREAM | Freq: Once | CUTANEOUS | Status: AC
  Administered 2015-12-25: 17:00:00 via TOPICAL
  Filled 2015-12-25 (×2): qty 15

## 2015-12-25 MED ORDER — SODIUM CHLORIDE 0.9 % IV BOLUS (SEPSIS)
1000.0000 mL | Freq: Once | INTRAVENOUS | Status: AC
  Administered 2015-12-25: 1000 mL via INTRAVENOUS

## 2015-12-25 NOTE — Discharge Instructions (Signed)
Cellulitis Cellulitis is an infection of the skin and the tissue beneath it. The infected area is usually red and tender. Cellulitis occurs most often in the arms and lower legs.  CAUSES  Cellulitis is caused by bacteria that enter the skin through cracks or cuts in the skin. The most common types of bacteria that cause cellulitis are staphylococci and streptococci. SIGNS AND SYMPTOMS   Redness and warmth.  Swelling.  Tenderness or pain.  Fever. DIAGNOSIS  Your health care provider can usually determine what is wrong based on a physical exam. Blood tests may also be done. TREATMENT  Treatment usually involves taking an antibiotic medicine. HOME CARE INSTRUCTIONS   Take your antibiotic medicine as directed by your health care provider. Finish the antibiotic even if you start to feel better.  Keep the infected arm or leg elevated to reduce swelling.  Apply a warm cloth to the affected area up to 4 times per day to relieve pain.  Take medicines only as directed by your health care provider.  Keep all follow-up visits as directed by your health care provider. SEEK MEDICAL CARE IF:   You notice red streaks coming from the infected area.  Your red area gets larger or turns dark in color.  Your bone or joint underneath the infected area becomes painful after the skin has healed.  Your infection returns in the same area or another area.  You notice a swollen bump in the infected area.  You develop new symptoms.  You have a fever. SEEK IMMEDIATE MEDICAL CARE IF:   You feel very sleepy.  You develop vomiting or diarrhea.  You have a general ill feeling (malaise) with muscle aches and pains.   This information is not intended to replace advice given to you by your health care provider. Make sure you discuss any questions you have with your health care provider.   Document Released: 04/14/2005 Document Revised: 03/26/2015 Document Reviewed: 09/20/2011 Elsevier Interactive  Patient Education 2016 [place].  Diabetes and Foot Care Diabetes may cause you to have problems because of poor blood supply (circulation) to your feet and legs. This may cause the skin on your feet to become thinner, break easier, and heal more slowly. Your skin may become dry, and the skin may peel and crack. You may also have nerve damage in your legs and feet causing decreased feeling in them. You may not notice minor injuries to your feet that could lead to infections or more serious problems. Taking care of your feet is one of the most important things you can do for yourself.  HOME CARE INSTRUCTIONS  Wear shoes at all times, even in the house. Do not go barefoot. Bare feet are easily injured.  Check your feet daily for blisters, cuts, and redness. If you cannot see the bottom of your feet, use a mirror or ask someone for help.  Wash your feet with warm water (do not use hot water) and mild soap. Then pat your feet and the areas between your toes until they are completely dry. Do not soak your feet as this can dry your skin.  Apply a moisturizing lotion or petroleum jelly (that does not contain alcohol and is unscented) to the skin on your feet and to dry, brittle toenails. Do not apply lotion between your toes.  Trim your toenails straight across. Do not dig under them or around the cuticle. File the edges of your nails with an emery board or nail file.  Do  not cut corns or calluses or try to remove them with medicine.  Wear clean socks or stockings every day. Make sure they are not too tight. Do not wear knee-high stockings since they may decrease blood flow to your legs.  Wear shoes that fit properly and have enough cushioning. To break in new shoes, wear them for just a few hours a day. This prevents you from injuring your feet. Always look in your shoes before you put them on to be sure there are no objects inside.  Do not cross your legs. This may decrease the blood flow to  your feet.  If you find a minor scrape, cut, or break in the skin on your feet, keep it and the skin around it clean and dry. These areas may be cleansed with mild soap and water. Do not cleanse the area with peroxide, alcohol, or iodine.  When you remove an adhesive bandage, be sure not to damage the skin around it.  If you have a wound, look at it several times a day to make sure it is healing.  Do not use heating pads or hot water bottles. They may burn your skin. If you have lost feeling in your feet or legs, you may not know it is happening until it is too late.  Make sure your health care provider performs a complete foot exam at least annually or more often if you have foot problems. Report any cuts, sores, or bruises to your health care provider immediately. SEEK MEDICAL CARE IF:   You have an injury that is not healing.  You have cuts or breaks in the skin.  You have an ingrown nail.  You notice redness on your legs or feet.  You feel burning or tingling in your legs or feet.  You have pain or cramps in your legs and feet.  Your legs or feet are numb.  Your feet always feel cold. SEEK IMMEDIATE MEDICAL CARE IF:   There is increasing redness, swelling, or pain in or around a wound.  There is a red line that goes up your leg.  Pus is coming from a wound.  You develop a fever or as directed by your health care provider.  You notice a bad smell coming from an ulcer or wound.   This information is not intended to replace advice given to you by your health care provider. Make sure you discuss any questions you have with your health care provider.   Document Released: 07/02/2000 Document Revised: 03/07/2013 Document Reviewed: 12/12/2012 Elsevier Interactive Patient Education 2016 [employer]   Probiotics  WHAT ARE PROBIOTICS?  Probiotics are the good bacteria and yeasts that live in your body and keep you and your digestive system healthy. Probiotics also help  your body's defense (immune) system and protect your body against bad bacterial growth.  Certain foods contain probiotics, such as yogurt. Probiotics can also be purchased as a supplement. As with any supplement or drug, it is important to discuss its use with your health care provider.  WHAT AFFECTS THE BALANCE OF BACTERIA IN MY BODY?  The balance of bacteria in your body can be affected by:  Antibiotic medicines. Antibiotics are sometimes necessary to treat infection. Unfortunately, they may kill good or friendly bacteria in your body as well as the bad bacteria. This may lead to stomach problems like diarrhea, gas, and cramping.  Disease. Some conditions are the result of an overgrowth of bad bacteria, yeasts, parasites, or fungi. These  conditions include:  Infectious diarrhea.  Stomach and respiratory infections.  Skin infections.  Irritable bowel syndrome (IBS).  Inflammatory bowel diseases.  Ulcer due to Helicobacter pylori (H. pylori) infection.  Tooth decay and periodontal disease.  Vaginal infections. Stress and poor diet may also lower the good bacteria in your body.  WHAT TYPE OF PROBIOTIC IS RIGHT FOR ME?  Probiotics are available over the counter at your local pharmacy, health food, or grocery store. They come in many different forms, combinations of strains, and dosing strengths. Some may need to be refrigerated. Always read the label for storage and usage instructions.  Specific strains have been shown to be more effective for certain conditions. Ask your health care provider what option is best for you.  WHY WOULD I NEED PROBIOTICS?  There are many reasons your health care provider might recommend a probiotic supplement, including:  Diarrhea.  Constipation.  IBS.  Respiratory infections.  Yeast infections.  Acne, eczema, and other skin conditions.  Frequent urinary tract infections (UTIs). ARE THERE SIDE EFFECTS OF PROBIOTICS?  Some people experience mild side effects when  taking probiotics. Side effects are usually temporary and may include:  Gas.  Bloating.  Cramping. Rarely, serious side effects, such as infection or immune system changes, may occur.  WHAT ELSE DO I NEED TO KNOW ABOUT PROBIOTICS?  There are many different strains of probiotics. Certain strains may be more effective depending on your condition. Probiotics are available in varying doses. Ask your health care provider which probiotic you should use and how often.  If you are taking probiotics along with antibiotics, it is generally recommended to wait at least 2 hours between taking the antibiotic and taking the probiotic.  FOR MORE INFORMATION:  [email or web address]  This information is not intended to replace advice given to you by your health care provider. Make sure you discuss any questions you have with your health care provider.  Document Released: 01/30/2014 Document Reviewed: 01/30/2014  Elsevier Interactive Patient Education [employer].

## 2015-12-25 NOTE — ED Notes (Signed)
Pt was seen in ED Monday for cellulitis of the right foot, pt reports an increase in pain and swelling
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
## 2016-09-09 ENCOUNTER — Encounter: Payer: Self-pay | Admitting: Emergency Medicine

## 2016-09-09 ENCOUNTER — Emergency Department: Payer: Self-pay

## 2016-09-09 ENCOUNTER — Emergency Department
Admission: EM | Admit: 2016-09-09 | Discharge: 2016-09-09 | Disposition: A | Discharge status: Home / Self Care | Payer: Self-pay | Attending: Student in an Organized Health Care Education/Training Program | Admitting: Student in an Organized Health Care Education/Training Program

## 2016-09-09 DIAGNOSIS — E11621 Type 2 diabetes mellitus with foot ulcer: Secondary | ICD-10-CM | POA: Insufficient documentation

## 2016-09-09 DIAGNOSIS — F17200 Nicotine dependence, unspecified, uncomplicated: Secondary | ICD-10-CM | POA: Insufficient documentation

## 2016-09-09 DIAGNOSIS — L97422 Non-pressure chronic ulcer of left heel and midfoot with fat layer exposed: Secondary | ICD-10-CM | POA: Insufficient documentation

## 2016-09-09 DIAGNOSIS — Z794 Long term (current) use of insulin: Secondary | ICD-10-CM | POA: Insufficient documentation

## 2016-09-09 DIAGNOSIS — L97509 Non-pressure chronic ulcer of other part of unspecified foot with unspecified severity: Secondary | ICD-10-CM

## 2016-09-09 DIAGNOSIS — Z791 Long term (current) use of non-steroidal anti-inflammatories (NSAID): Secondary | ICD-10-CM | POA: Insufficient documentation

## 2016-09-09 DIAGNOSIS — Z79899 Other long term (current) drug therapy: Secondary | ICD-10-CM | POA: Insufficient documentation

## 2016-09-09 LAB — COMPREHENSIVE METABOLIC PANEL
ALK PHOS: 87 U/L (ref 38–126)
ALT: 16 U/L (ref 14–54)
ANION GAP: 8 (ref 5–15)
AST: 17 U/L (ref 15–41)
Albumin: 3.5 g/dL (ref 3.5–5.0)
BUN: 19 mg/dL (ref 6–20)
CALCIUM: 8.7 mg/dL — AB (ref 8.9–10.3)
CO2: 28 mmol/L (ref 22–32)
CREATININE: 1.12 mg/dL — AB (ref 0.44–1.00)
Chloride: 101 mmol/L (ref 101–111)
GFR, EST NON AFRICAN AMERICAN: 56 mL/min — AB (ref >60)
Glucose, Bld: 329 mg/dL — ABNORMAL HIGH (ref 65–99)
Potassium: 4.2 mmol/L (ref 3.5–5.1)
Sodium: 137 mmol/L (ref 135–145)
Total Bilirubin: 0.5 mg/dL (ref 0.3–1.2)
Total Protein: 7.9 g/dL (ref 6.5–8.1)

## 2016-09-09 LAB — CBC WITH DIFFERENTIAL/PLATELET
BASOS ABS: 0.1 10*3/uL (ref 0–0.1)
BASOS PCT: 1 %
EOS ABS: 0.3 10*3/uL (ref 0–0.7)
Eosinophils Relative: 3 %
HCT: 35.8 % (ref 35.0–47.0)
HEMOGLOBIN: 12.9 g/dL (ref 12.0–16.0)
Lymphocytes Relative: 20 %
Lymphs Abs: 1.9 10*3/uL (ref 1.0–3.6)
MCH: 31.7 pg (ref 26.0–34.0)
MCHC: 36.1 g/dL — ABNORMAL HIGH (ref 32.0–36.0)
MCV: 88 fL (ref 80.0–100.0)
Monocytes Absolute: 0.9 10*3/uL (ref 0.2–0.9)
Monocytes Relative: 9 %
NEUTROS PCT: 67 %
Neutro Abs: 6.3 10*3/uL (ref 1.4–6.5)
Platelets: 272 10*3/uL (ref 150–440)
RBC: 4.07 MIL/uL (ref 3.80–5.20)
RDW: 12.3 % (ref 11.5–14.5)
WBC: 9.5 10*3/uL (ref 3.6–11.0)

## 2016-09-09 MED ORDER — CIPROFLOXACIN HCL 500 MG PO TABS: 500.0000 mg | ORAL_TABLET | Freq: Two times a day (BID) | ORAL | 0 refills | Status: DC

## 2016-09-09 MED ORDER — INSULIN ASPART 100 UNIT/ML ~~LOC~~ SOLN
SUBCUTANEOUS | Status: AC
  Administered 2016-09-09: 5 [IU] via SUBCUTANEOUS
  Filled 2016-09-09: qty 5

## 2016-09-09 MED ORDER — CLINDAMYCIN HCL 300 MG PO CAPS
300.0000 mg | ORAL_CAPSULE | Freq: Three times a day (TID) | ORAL | 0 refills | Status: DC
Start: 2016-09-09 — End: 2016-12-31

## 2016-09-09 MED ORDER — INSULIN ASPART 100 UNIT/ML ~~LOC~~ SOLN
5.0000 [IU] | Freq: Once | SUBCUTANEOUS | Status: AC
  Administered 2016-09-09: 5 [IU] via SUBCUTANEOUS
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
## 2016-09-09 NOTE — ED Triage Notes (Signed)
Pt ambulatory to triage in NAD, report wound to bottom of left foot, about 1.5 inch in diameter, purulent drainage present.  Pt reports hx diabetes.  Has not been tx by any doctor yet.

## 2016-12-24 ENCOUNTER — Other Ambulatory Visit: Payer: Self-pay | Admitting: Surgery

## 2016-12-24 ENCOUNTER — Ambulatory Visit
Admission: RE | Admit: 2016-12-24 | Discharge: 2016-12-24 | Disposition: A | Discharge status: Home / Self Care | Payer: Medicaid Other | Source: Ambulatory Visit | Attending: Surgery | Admitting: Surgery

## 2016-12-24 ENCOUNTER — Encounter: Payer: Medicaid Other | Attending: Surgery | Admitting: Surgery

## 2016-12-24 DIAGNOSIS — M7732 Calcaneal spur, left foot: Secondary | ICD-10-CM | POA: Diagnosis not present

## 2016-12-24 DIAGNOSIS — F17218 Nicotine dependence, cigarettes, with other nicotine-induced disorders: Secondary | ICD-10-CM | POA: Diagnosis not present

## 2016-12-24 DIAGNOSIS — E11621 Type 2 diabetes mellitus with foot ulcer: Secondary | ICD-10-CM | POA: Insufficient documentation

## 2016-12-24 DIAGNOSIS — E1165 Type 2 diabetes mellitus with hyperglycemia: Secondary | ICD-10-CM | POA: Insufficient documentation

## 2016-12-24 DIAGNOSIS — L97523 Non-pressure chronic ulcer of other part of left foot with necrosis of muscle: Secondary | ICD-10-CM | POA: Insufficient documentation

## 2016-12-24 DIAGNOSIS — M19072 Primary osteoarthritis, left ankle and foot: Secondary | ICD-10-CM | POA: Insufficient documentation

## 2016-12-24 DIAGNOSIS — Z880 Allergy status to penicillin: Secondary | ICD-10-CM | POA: Insufficient documentation

## 2016-12-24 DIAGNOSIS — B999 Unspecified infectious disease: Secondary | ICD-10-CM | POA: Diagnosis present

## 2016-12-24 DIAGNOSIS — Z885 Allergy status to narcotic agent status: Secondary | ICD-10-CM | POA: Insufficient documentation

## 2016-12-24 DIAGNOSIS — E1140 Type 2 diabetes mellitus with diabetic neuropathy, unspecified: Secondary | ICD-10-CM | POA: Insufficient documentation

## 2016-12-24 DIAGNOSIS — E785 Hyperlipidemia, unspecified: Secondary | ICD-10-CM | POA: Insufficient documentation

## 2016-12-25 NOTE — Progress Notes (Addendum)
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 Disorders; Human Immunodeficiency Virus Medical History: Negative for: Anemia; Hemophilia; Human Immunodeficiency Virus; Lymphedema; Sickle Cell Disease Respiratory Complaints and Symptoms: No Complaints or Symptoms Complaints and Symptoms: Negative for: Chronic or frequent coughs; Shortness of Breath Medical History: Negative for: Aspiration; Asthma; Chronic Obstructive Pulmonary Disease (COPD); Pneumothorax; Sleep Apnea; Tuberculosis Cardiovascular Complaints and Symptoms: Positive for: LE edema - LLE currently Medical History: Negative for: Angina; Arrhythmia; Congestive Heart Failure; Coronary Artery Disease; Deep Vein Thrombosis; Hypertension; Hypotension; Myocardial Infarction; Peripheral Arterial Disease; Peripheral Venous Disease;  Phlebitis; Vasculitis Past Medical History Notes: hyperlipidemia Gastrointestinal Complaints and Symptoms: No Complaints or Symptoms Complaints and Symptoms: Negative for: Frequent diarrhea; Nausea; Vomiting Medical History: Negative for: Cirrhosis ; Colitis; Crohnos; Hepatitis A; Hepatitis B; Hepatitis C Cashen,  Donna Fisher. ([id number]) Genitourinary Complaints and Symptoms: No Complaints or Symptoms Complaints and Symptoms: Negative for: Kidney failure/ Dialysis; Incontinence/dribbling Medical History: Negative for: End Stage Renal Disease Past Medical History Notes: genital herpes Immunological Complaints and Symptoms: No Complaints or Symptoms Complaints and Symptoms: Negative for: Hives; Itching Medical History: Negative for: Lupus Erythematosus; Raynaudos; Scleroderma Integumentary (Skin) Complaints and Symptoms: No Complaints or Symptoms Complaints and Symptoms: Negative for: Wounds; Bleeding or bruising tendency; Breakdown; Swelling Medical History: Negative for: History of Burn; History of pressure wounds Musculoskeletal Complaints and Symptoms: Negative for: Muscle Pain; Muscle Weakness Medical History: Negative for: Gout; Rheumatoid Arthritis; Osteoarthritis; Osteomyelitis Neurologic Complaints and Symptoms: Negative for: Numbness/parasthesias; Focal/Weakness Medical History: Positive for: Neuropathy Negative for: Dementia; Quadriplegia; Paraplegia; Seizure Disorder MAYLYN, NARVAIZ ([id number]) Psychiatric Complaints and Symptoms: No Complaints or Symptoms Complaints and Symptoms: Negative for: Anxiety; Claustrophobia Medical History: Negative for: Anorexia/bulimia; Confinement Anxiety Endocrine Complaints and Symptoms: No Complaints or Symptoms Medical History: Positive for: Type II Diabetes - no medications r/t no isnurance Past Medical History Notes: not checking blood sugars or taking medications r/t no insurance Time with diabetes: 8  years Oncologic Complaints and Symptoms: No Complaints or Symptoms Medical History: Negative for: Received Chemotherapy; Received Radiation Immunizations Pneumococcal Vaccine: Received Pneumococcal Vaccination: No Immunization Notes: up to date Family and Social History Current every day smoker; Marital Status - Married; Alcohol Use: Never; Drug Use: No History; Caffeine Use: Daily; Financial Concerns: No; Food, Clothing or Shelter Needs: No; Support System Lacking: No; Transportation Concerns: No; Advanced Directives: No; Patient does not want information on Advanced Directives Physician Affirmation I have reviewed and agree with the above information. Electronic Signature(s) Signed: 12/24/2016 11:31:51 AM By: Montey Hora Signed: 12/24/2016 4:12:58 PM By: Christin Fudge MD, FACS Entered By: Christin Fudge on 12/24/2016 08:21:19 Donna Fisher ([id number]) Rise Paganini, Rica Mote ([id number]) -------------------------------------------------------------------------------- SuperBill Details Patient Name: Donna Fisher Date of Service: 12/24/2016 Medical Record Number: [id number] Patient Account Number: [id number] Date of Birth/Sex: [birth year] (51 y.o. 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, cigarettes, with other nicotine-induced disorders Facility Procedures CPT4 Code Description: 42353614 99213 - WOUND CARE VISIT-LEV 3 EST PT Modifier: Quantity: 1 CPT4 Code Description: 43154008 11043 - DEB MUSC/FASCIA 20 SQ CM/< ICD-10 Description Diagnosis E11.621 Type 2 diabetes mellitus with foot ulcer  E11.65 Type 2 diabetes mellitus with hyperglycemia L97.523 Non-pressure chronic ulcer of other part of left  foot Modifier: with necrosi Quantity: 1 s of muscle CPT4 Code Description: [id number] 99406-SMOKING CESSATION 3-10MINS ICD-10 Description Diagnosis E11.621 Type 2 diabetes mellitus with foot ulcer E11.65 Type 2 diabetes mellitus with hyperglycemia L97.523 Non-pressure chronic ulcer of other part of left  foot F17.218 Nicotine dependence, cigarettes, with other nicotine-i Modifier: with necrosi nduced disor Quantity: 1 s of muscle ders Physician Procedures CPT4 Code Description: 3267124 58099 - WC PHYS LEVEL 4 - NEW PT ICD-10 Description Diagnosis E11.621 Type 2 diabetes mellitus with foot ulcer E11.65 Type 2 diabetes mellitus with hyperglycemia L97.523 Non-pressure chronic ulcer of other part of left foot  w MYLIA, PONDEXTER. ([id number]) Modifier: 25 ith necrosis Quantity: 1 of muscle Electronic Signature(s) Signed: 12/30/2016 8:10:52 AM By: Christin Fudge MD, FACS Previous Signature: 12/24/2016 10:46:36 AM  Version By: Montey Hora Previous Signature: 12/24/2016 4:12:58 PM Version By: Christin Fudge MD, FACS Previous Signature: 12/24/2016 9:25:08 AM Version By: Christin Fudge MD, FACS Entered By: Christin Fudge on 12/30/2016 08:10:52

## 2016-12-25 NOTE — Progress Notes (Signed)
Donna Fisher, Donna Fisher ([id number]) Visit Report for 12/24/2016 Abuse/Suicide Risk Screen Details Patient Name: Donna Fisher, Donna Fisher Date of Service: 12/24/2016 8:00 AM Medical Record Number: [id number] Patient Account Number: [id number] Date of Birth/Sex: 1966/02/18 (51 y.o. Female) Treating RN: Donna Fisher Primary Care Donna Fisher: PATIENT, NO Other Clinician: Referring Donna Fisher: Treating Donna Fisher/Extender: Donna Fisher in Treatment: 0 Abuse/Suicide Risk Screen Items Answer ABUSE/SUICIDE RISK SCREEN: Has anyone close to you tried to hurt or harm you recentlyo No Do you feel uncomfortable with anyone in your familyo No Has anyone forced you do things that you didnot want to doo No Do you have any thoughts of harming yourselfo No Patient displays signs or symptoms of abuse and/or neglect. No Electronic Signature(s) Signed: 12/24/2016 11:31:51 AM By: Donna Fisher Entered By: Donna Fisher on 12/24/2016 08:13:06 Donna Fisher ([id number]) -------------------------------------------------------------------------------- Activities of Daily Living Details Patient Name: Donna Fisher Date of Service: 12/24/2016 8:00 AM Medical Record Number: [id number] Patient Account Number: [id number] Date of Birth/Sex: 1965-08-11 (51 y.o. Female) Treating RN: Donna Fisher Primary Care Donna Fisher: PATIENT, NO Other Clinician: Referring Dorena Dorfman: Treating Kyndal Heringer/Extender: Donna Fisher in Treatment: 0 Activities of Daily Living Items Answer Activities of Daily Living (Please select one for each item) Drive Automobile Completely Able Take Medications Completely Able Use Telephone Completely Able Care for Appearance Completely Able Use Toilet Completely Able Bath / Shower Completely Able Dress Self Completely Able Feed Self Completely Able Walk Completely Able Get In / Out Bed Completely Able Housework Completely Able Prepare Meals Completely Able Handle Money Completely Able Shop  for Self Completely Able Electronic Signature(s) Signed: 12/24/2016 11:31:51 AM By: Donna Fisher Entered By: Donna Fisher on 12/24/2016 08:13:28 Donna Fisher ([id number]) -------------------------------------------------------------------------------- Education Assessment Details Patient Name: Donna Fisher Date of Service: 12/24/2016 8:00 AM Medical Record Number: [id number] Patient Account Number: [id number] Date of Birth/Sex: February 10, 1966 (51 y.o. Female) Treating RN: Donna Fisher Primary Care Donna Fisher: PATIENT, NO Other Clinician: Referring Donna Fisher: Treating Donna Fisher/Extender: Donna Fisher in Treatment: 0 Primary Learner Assessed: Patient Learning Preferences/Education Level/Primary Language Learning Preference: Explanation, Demonstration Highest Education Level: High School Preferred Language: English Cognitive Barrier Assessment/Beliefs Language Barrier: No Translator Needed: No Memory Deficit: No Emotional Barrier: No Cultural/Religious Beliefs Affecting Medical No Care: Physical Barrier Assessment Impaired Vision: No Impaired Hearing: No Decreased Hand dexterity: No Knowledge/Comprehension Assessment Knowledge Level: Medium Comprehension Level: Medium Ability to understand written Medium instructions: Ability to understand verbal Medium instructions: Motivation Assessment Anxiety Level: Calm Cooperation: Cooperative Education Importance: Acknowledges Need Interest in Health Problems: Asks Questions Perception: Coherent Willingness to Engage in Self- Medium Management Activities: Readiness to Engage in Self- Medium Management Activities: Electronic Signature(s) Donna, Fisher ([id number]) Signed: 12/24/2016 11:31:51 AM By: Donna Fisher Entered By: Donna Fisher on 12/24/2016 08:13:51 Donna Fisher ([id number]) -------------------------------------------------------------------------------- Fall Risk Assessment Details Patient  Name: Donna Fisher Date of Service: 12/24/2016 8:00 AM Medical Record Number: [id number] Patient Account Number: [id number] Date of Birth/Sex: 03-Dec-1965 (51 y.o. Female) Treating RN: Donna Fisher Primary Care Donna Fisher: PATIENT, NO Other Clinician: Referring Donna Fisher: Treating Donna Fisher/Extender: Donna Fisher in Treatment: 0 Fall Risk Assessment Items Have you had 2 or more falls in the last 12 monthso 0 No Have you had any fall that resulted in injury in the last 12 monthso 0 No FALL RISK ASSESSMENT: History of falling - immediate or within 3 months 0 No Secondary diagnosis 0 No Ambulatory aid None/bed rest/wheelchair/nurse 0 Yes Crutches/cane/walker 0 No Furniture 0 No IV Access/Saline Lock 0 No Gait/Training Normal/bed  rest/immobile 0 Yes Weak 0 No Impaired 0 No Mental Status Oriented to own ability 0 Yes Electronic Signature(s) Signed: 12/24/2016 11:31:51 AM By: Donna Fisher Entered By: Donna Fisher on 12/24/2016 08:14:02 Donna Fisher ([id number]) -------------------------------------------------------------------------------- Nutrition Risk Assessment Details Patient Name: Donna Fisher Date of Service: 12/24/2016 8:00 AM Medical Record Number: [id number] Patient Account Number: [id number] Date of Birth/Sex: 01/30/1966 (51 y.o. Female) Treating RN: Donna Fisher Primary Care Donna Fisher: PATIENT, NO Other Clinician: Referring Donna Fisher: Treating Donna Fisher/Extender: Donna Fisher in Treatment: 0 Height (in): 64 Weight (lbs): 162 Body Mass Index (BMI): 27.8 Nutrition Risk Assessment Items NUTRITION RISK SCREEN: I have an illness or condition that made me change the kind and/or 0 No amount of food I eat I eat fewer than two meals per day 0 No I eat few fruits and vegetables, or milk products 0 No I have three or more drinks of beer, liquor or wine almost every day 0 No I have tooth or mouth problems that make it hard for me to eat 0 No I don't  always have enough money to buy the food I need 0 No I eat alone most of the time 0 No I take three or more different prescribed or over-the-counter drugs a 1 Yes day Without wanting to, I have lost or gained 10 pounds in the last six 0 No months I am not always physically able to shop, cook and/or feed myself 0 No Nutrition Protocols Good Risk Protocol 0 No interventions needed Moderate Risk Protocol Electronic Signature(s) Signed: 12/24/2016 11:31:51 AM By: Donna Fisher Entered By: Donna Fisher on 12/24/2016 08:14:08

## 2016-12-25 NOTE — Progress Notes (Signed)
LURINE, IMEL ([id number]) Visit Report for 12/24/2016 Allergy List Details Patient Name: Donna Fisher, Donna Fisher Date of Service: 12/24/2016 8:00 AM Medical Record Number: [id number] Patient Account Number: [id number] Date of Birth/Sex: 1966/05/21 (51 y.o. Female) Treating RN: Montey Hora Primary Care Dannis Deroche: PATIENT, NO Other Clinician: Referring Deryk Bozman: Treating Lourdes Manning/Extender: Frann Rider in Treatment: 0 Allergies Active Allergies penicillin codeine Allergy Notes Electronic Signature(s) Signed: 12/24/2016 11:31:51 AM By: Montey Hora Entered By: Montey Hora on 12/24/2016 08:12:55 Donna Fisher ([id number]) -------------------------------------------------------------------------------- Arrival Information Details Patient Name: Donna Fisher Date of Service: 12/24/2016 8:00 AM Medical Record Number: [id number] Patient Account Number: [id number] Date of Birth/Sex: August 15, 1965 (51 y.o. Female) Treating RN: Montey Hora Primary Care Dania Marsan: PATIENT, NO Other Clinician: Referring Aedin Jeansonne: Treating Mercury Rock/Extender: Frann Rider in Treatment: 0 Visit Information Patient Arrived: Ambulatory Arrival Time: 08:07 Accompanied By: self Transfer Assistance: None Patient Identification Verified: Yes Secondary Verification Process Yes Completed: Patient Has Alerts: Yes Patient Alerts: DMII Electronic Signature(s) Signed: 12/24/2016 11:31:51 AM By: Montey Hora Entered By: Montey Hora on 12/24/2016 08:09:06 Donna Fisher ([id number]) -------------------------------------------------------------------------------- Clinic Level of Care Assessment Details Patient Name: Donna Fisher Date of Service: 12/24/2016 8:00 AM Medical Record Number: [id number] Patient Account Number: [id number] Date of Birth/Sex: Dec 03, 1965 (51 y.o. Female) Treating RN: Montey Hora Primary Care Sadik Piascik: PATIENT, NO Other Clinician: Referring Valen Gillison: Treating  Sigifredo Pignato/Extender: Frann Rider in Treatment: 0 Clinic Level of Care Assessment Items TOOL 1 Quantity Score []  - Use when EandM and Procedure is performed on INITIAL visit 0 ASSESSMENTS - Nursing Assessment / Reassessment X - General Physical Exam (combine w/ comprehensive assessment (listed just 1 20 below) when performed on new pt. evals) X - Comprehensive Assessment (HX, ROS, Risk Assessments, Wounds Hx, etc.) 1 25 ASSESSMENTS - Wound and Skin Assessment / Reassessment []  - Dermatologic / Skin Assessment (not related to wound area) 0 ASSESSMENTS - Ostomy and/or Continence Assessment and Care []  - Incontinence Assessment and Management 0 []  - Ostomy Care Assessment and Management (repouching, etc.) 0 PROCESS - Coordination of Care X - Simple Patient / Family Education for ongoing care 1 15 []  - Complex (extensive) Patient / Family Education for ongoing care 0 X - Staff obtains Programmer, systems, Records, Test Results / Process Orders 1 10 []  - Staff telephones HHA, Nursing Homes / Clarify orders / etc 0 []  - Routine Transfer to another Facility (non-emergent condition) 0 []  - Routine Hospital Admission (non-emergent condition) 0 X - New Admissions / Biomedical engineer / Ordering NPWT, Apligraf, etc. 1 15 []  - Emergency Hospital Admission (emergent condition) 0 PROCESS - Special Needs []  - Pediatric / Minor Patient Management 0 []  - Isolation Patient Management 0 Eckhart, Latondra D. ([id number]) []  - Hearing / Language / Visual special needs 0 []  - Assessment of Community assistance (transportation, D/C planning, etc.) 0 []  - Additional assistance / Altered mentation 0 []  - Support Surface(s) Assessment (bed, cushion, seat, etc.) 0 INTERVENTIONS - Miscellaneous []  - External ear exam 0 []  - Patient Transfer (multiple staff / Civil Service fast streamer / Similar devices) 0 []  - Simple Staple / Suture removal (25 or less) 0 []  - Complex Staple / Suture removal (26 or more) 0 []  -  Hypo/Hyperglycemic Management (do not check if billed separately) 0 X - Ankle / Brachial Index (ABI) - do not check if billed separately 1 15 Has the patient been seen at the hospital within the last three years: Yes Total Score: 100 Level Of Care: New/Established - Level 3  Electronic Signature(s) Signed: 12/24/2016 11:31:51 AM By: Montey Hora Entered By: Montey Hora on 12/24/2016 10:45:18 Donna Fisher ([id number]) -------------------------------------------------------------------------------- Encounter Discharge Information Details Patient Name: Donna Fisher Date of Service: 12/24/2016 8:00 AM Medical Record Number: [id number] Patient Account Number: [id number] Date of Birth/Sex: 1966/04/15 (51 y.o. Female) Treating RN: Montey Hora Primary Care Carol Theys: PATIENT, NO Other Clinician: Referring Linday Rhodes: Treating Queen Abbett/Extender: Frann Rider in Treatment: 0 Encounter Discharge Information Items Discharge Pain Level: 0 Discharge Condition: Stable Ambulatory Status: Ambulatory Discharge Destination: Home Transportation: Private Auto Accompanied By: self Schedule Follow-up Appointment: Yes Medication Reconciliation completed No and provided to Patient/Care Mirakle Tomlin: Patient Clinical Summary of Care: Declined Electronic Signature(s) Signed: 12/24/2016 9:33:10 AM By: Sharon Mt Entered By: Sharon Mt on 12/24/2016 09:33:10 Donna Fisher ([id number]) -------------------------------------------------------------------------------- Lower Extremity Assessment Details Patient Name: Donna Fisher Date of Service: 12/24/2016 8:00 AM Medical Record Number: [id number] Patient Account Number: [id number] Date of Birth/Sex: [birth year] (51 y.o. Female) Treating RN: Montey Hora Primary Care Camie Hauss: PATIENT, NO Other Clinician: Referring Noe Goyer: Treating Kaylin Schellenberg/Extender: Frann Rider in Treatment: 0 Edema Assessment Assessed: [Left: No] [Right:  No] Edema: [Left: Ye] [Right: s] Calf Left: Right: Point of Measurement: 32 cm From Medial Instep 34.2 cm 33.5 cm Ankle Left: Right: Point of Measurement: 12 cm From Medial Instep 23.6 cm 21.5 cm Vascular Assessment Pulses: Dorsalis Pedis Palpable: [Left:Yes] [Right:Yes] Doppler Audible: [Right:Yes] Posterior Tibial Palpable: [Left:Yes] [Right:Yes] Doppler Audible: [Right:Yes] Extremity colors, hair growth, and conditions: Extremity Color: [Left:Normal] [Right:Normal] Hair Growth on Extremity: [Left:Yes] [Right:Yes] Temperature of Extremity: [Left:Hot] [Right:Warm] Capillary Refill: [Left:< 3 seconds] [Right:< 3 seconds] Blood Pressure: Brachial: [Right:96] Dorsalis Pedis: 108 [Left:Dorsalis Pedis: 110] Ankle: Posterior Tibial: 96 [Left:Posterior Tibial: 94 1.13] [Right:1.15] Toe Nail Assessment Left: Right: Thick: Yes Yes Discolored: No No Deformed: No No Improper Length and Hygiene: No No Donna Fisher, Donna Fisher ([id number]) Electronic Signature(s) Signed: 12/24/2016 11:31:51 AM By: Montey Hora Entered By: Montey Hora on 12/24/2016 08:46:54 Donna Fisher ([id number]) -------------------------------------------------------------------------------- Multi Wound Chart Details Patient Name: Donna Fisher Date of Service: 12/24/2016 8:00 AM Medical Record Number: [id number] Patient Account Number: [id number] Date of Birth/Sex: 07-Apr-1966 (51 y.o. Female) Treating RN: Montey Hora Primary Care Kyleah Pensabene: PATIENT, NO Other Clinician: Referring Raunak Antuna: Treating Jamarcus Laduke/Extender: Frann Rider in Treatment: 0 Vital Signs Height(in): 64 Pulse(bpm): 106 Weight(lbs): 162 Blood Pressure 106/66 (mmHg): Body Mass Index(BMI): 28 Temperature(F): 98.2 Respiratory Rate 16 (breaths/min): Photos: [1:No Photos] [N/A:N/A] Wound Location: [1:Left Foot] [N/A:N/A] Wounding Event: [1:Gradually Appeared] [N/A:N/A] Primary Etiology: [1:Diabetic Wound/Ulcer of the  Lower Extremity] [N/A:N/A] Comorbid History: [1:Type II Diabetes, Neuropathy] [N/A:N/A] Date Acquired: [1:06/28/2016] [N/A:N/A] Weeks of Treatment: [1:0] [N/A:N/A] Wound Status: [1:Open] [N/A:N/A] Measurements L x W x D 1.5x1.2x1.7 [N/A:N/A] (cm) Area (cm) : [1:1.414] [N/A:N/A] Volume (cm) : [1:2.403] [N/A:N/A] Classification: [1:Grade 2] [N/A:N/A] Exudate Amount: [1:Large] [N/A:N/A] Exudate Type: [1:Serous] [N/A:N/A] Exudate Color: [1:amber] [N/A:N/A] Foul Odor After [1:Yes] [N/A:N/A] Cleansing: Odor Anticipated Due to No [N/A:N/A] Product Use: Wound Margin: [1:Flat and Intact] [N/A:N/A] Granulation Amount: [1:Small (1-33%)] [N/A:N/A] Granulation Quality: [1:Red] [N/A:N/A] Necrotic Amount: [1:Large (67-100%)] [N/A:N/A] Necrotic Tissue: [1:Eschar, Adherent Slough] [N/A:N/A] Exposed Structures: [1:Fat Layer (Subcutaneous Tissue) Exposed: Yes Muscle: Yes] [N/A:N/A] Fascia: No Tendon: No Joint: No Bone: No Epithelialization: None N/A N/A Debridement: Debridement (35573- N/A N/A 11047) Pre-procedure 09:02 N/A N/A Verification/Time Out Taken: Pain Control: Lidocaine 4% Topical N/A N/A Solution Tissue Debrided: Necrotic/Eschar, N/A N/A Fibrin/Slough, Muscle, Skin, Callus, Subcutaneous Level: Skin/Subcutaneous N/A N/A Tissue/Muscle Debridement Area (sq 1.8 N/A N/A cm): Instrument:  Forceps, Scissors N/A N/A Bleeding: Minimum N/A N/A Hemostasis Achieved: Pressure N/A N/A Procedural Pain: 0 N/A N/A Post Procedural Pain: 0 N/A N/A Debridement Treatment Procedure was tolerated N/A N/A Response: well Post Debridement 1.5x1.2x2 N/A N/A Measurements L x W x D (cm) Post Debridement 2.827 N/A N/A Volume: (cm) Periwound Skin Texture: Callus: Yes N/A N/A Excoriation: No Induration: No Crepitus: No Rash: No Scarring: No Periwound Skin Maceration: No N/A N/A Moisture: Dry/Scaly: No Periwound Skin Color: Erythema: Yes N/A N/A Atrophie Blanche: No Cyanosis:  No Ecchymosis: No Hemosiderin Staining: No Mottled: No Pallor: No Rubor: No Erythema Location: Circumferential N/A N/A Temperature: Hot N/A N/A Donna Fisher, BRYARS. ([id number]) Tenderness on Yes N/A N/A Palpation: Wound Preparation: Ulcer Cleansing: N/A N/A Rinsed/Irrigated with Saline Topical Anesthetic Applied: Other: lidocaine 4% Procedures Performed: Debridement N/A N/A Treatment Notes Electronic Signature(s) Signed: 12/24/2016 9:14:47 AM By: Christin Fudge MD, FACS Entered By: Christin Fudge on 12/24/2016 09:14:47 Donna Fisher ([id number]) -------------------------------------------------------------------------------- Multi-Disciplinary Care Plan Details Patient Name: Donna Fisher Date of Service: 12/24/2016 8:00 AM Medical Record Number: [id number] Patient Account Number: [id number] Date of Birth/Sex: 1966/05/25 (51 y.o. Female) Treating RN: Montey Hora Primary Care Skyla Champagne: PATIENT, NO Other Clinician: Referring Charisse Wendell: Treating Hadas Jessop/Extender: Frann Rider in Treatment: 0 Active Inactive ` Nutrition Nursing Diagnoses: Impaired glucose control: actual or potential Goals: Patient/caregiver verbalizes understanding of need to maintain therapeutic glucose control per primary care physician Date Initiated: 12/24/2016 Target Resolution Date: 02/18/2017 Goal Status: Active Interventions: Provide education on elevated blood sugars and impact on wound healing Notes: ` Orientation to the Wound Care Program Nursing Diagnoses: Knowledge deficit related to the wound healing center program Goals: Patient/caregiver will verbalize understanding of the [facility] Date Initiated: 12/24/2016 Target Resolution Date: 03/11/2017 Goal Status: Active Interventions: Provide education on orientation to the wound center Notes: ` Wound/Skin Impairment Nursing Diagnoses: Impaired tissue integrity Donna Fisher, Donna Fisher  ([id number]) Goals: Patient/caregiver will verbalize understanding of skin care regimen Date Initiated: 12/24/2016 Target Resolution Date: 03/11/2017 Goal Status: Active Ulcer/skin breakdown will have a volume reduction of 30% by week 4 Date Initiated: 12/24/2016 Target Resolution Date: 03/11/2017 Goal Status: Active Ulcer/skin breakdown will have a volume reduction of 50% by week 8 Date Initiated: 12/24/2016 Target Resolution Date: 03/11/2017 Goal Status: Active Ulcer/skin breakdown will have a volume reduction of 80% by week 12 Date Initiated: 12/24/2016 Target Resolution Date: 03/11/2017 Goal Status: Active Ulcer/skin breakdown will heal within 14 weeks Date Initiated: 12/24/2016 Target Resolution Date: 03/11/2017 Goal Status: Active Interventions: Assess patient/caregiver ability to obtain necessary supplies Assess patient/caregiver ability to perform ulcer/skin care regimen upon admission and as needed Assess ulceration(s) every visit Notes: Electronic Signature(s) Signed: 12/24/2016 11:31:51 AM By: Montey Hora Entered By: Montey Hora on 12/24/2016 08:57:35 Donna Fisher ([id number]) -------------------------------------------------------------------------------- Pain Assessment Details Patient Name: Donna Fisher Date of Service: 12/24/2016 8:00 AM Medical Record Number: [id number] Patient Account Number: [id number] Date of Birth/Sex: [birth year] (51 y.o. Female) Treating RN: Montey Hora Primary Care Chapman Matteucci: PATIENT, NO Other Clinician: Referring Reeve Turnley: Treating Aubrianna Orchard/Extender: Frann Rider in Treatment: 0 Active Problems Location of Pain Severity and Description of Pain Patient Has Paino Yes Site Locations Pain Location: Pain in Ulcers With Dressing Change: Yes Duration of the Pain. Constant / Intermittento Constant Pain Management and Medication Current Pain Management: Notes Topical or injectable lidocaine is offered to patient for acute pain  when surgical debridement is performed. If needed, Patient is instructed to use over the counter pain medication for the  following 24-48 hours after debridement. Wound care MDs do not prescribed pain medications. Patient has chronic pain or uncontrolled pain. Patient has been instructed to make an appointment with their Primary Care Physician for pain management. Electronic Signature(s) Signed: 12/24/2016 11:31:51 AM By: Montey Hora Entered By: Montey Hora on 12/24/2016 08:09:25 Donna Fisher ([id number]) -------------------------------------------------------------------------------- Patient/Caregiver Education Details Patient Name: Donna Fisher Date of Service: 12/24/2016 8:00 AM Medical Record Number: [id number] Patient Account Number: [id number] Date of Birth/Gender: 09/29/1965 (51 y.o. Female) Treating RN: Montey Hora Primary Care Physician: PATIENT, NO Other Clinician: Referring Physician: Treating Physician/Extender: Frann Rider in Treatment: 0 Education Assessment Education Provided To: Patient Education Topics Provided Wound/Skin Impairment: Handouts: Other: wound care as ordered Methods: Demonstration, Explain/Verbal Responses: State content correctly Electronic Signature(s) Signed: 12/24/2016 11:31:51 AM By: Montey Hora Entered By: Montey Hora on 12/24/2016 09:00:04 Donna Fisher ([id number]) -------------------------------------------------------------------------------- Wound Assessment Details Patient Name: Donna Fisher Date of Service: 12/24/2016 8:00 AM Medical Record Number: [id number] Patient Account Number: [id number] Date of Birth/Sex: May 21, 1966 (51 y.o. Female) Treating RN: Montey Hora Primary Care Dakotah Heiman: PATIENT, NO Other Clinician: Referring Yanixan Mellinger: Treating Penn Grissett/Extender: Frann Rider in Treatment: 0 Wound Status Wound Number: 1 Primary Diabetic Wound/Ulcer of the Lower Etiology: Extremity Wound  Location: Left Foot Wound Status: Open Wounding Event: Gradually Appeared Comorbid Type II Diabetes, Neuropathy Date Acquired: 06/28/2016 History: Weeks Of Treatment: 0 Clustered Wound: No Photos Photo Uploaded By: Montey Hora on 12/24/2016 10:41:57 Wound Measurements Length: (cm) 1.5 Width: (cm) 1.2 Depth: (cm) 1.7 Area: (cm) 1.414 Volume: (cm) 2.403 % Reduction in Area: % Reduction in Volume: Epithelialization: None Tunneling: No Undermining: No Wound Description Classification: Grade 2 Wound Margin: Flat and Intact Exudate Amount: Large Exudate Type: Serous Exudate Color: amber Foul Odor After Cleansing: Yes Due to Product Use: No Slough/Fibrino Yes Wound Bed Granulation Amount: Small (1-33%) Exposed Structure Granulation Quality: Red Fascia Exposed: No Necrotic Amount: Large (67-100%) Fat Layer (Subcutaneous Tissue) Exposed: Yes Necrotic Quality: Eschar, Adherent Slough Tendon Exposed: No Amstutz, Astin D. ([id number]) Muscle Exposed: Yes Necrosis of Muscle: Yes Joint Exposed: No Bone Exposed: No Periwound Skin Texture Texture Color No Abnormalities Noted: No No Abnormalities Noted: No Callus: Yes Atrophie Blanche: No Crepitus: No Cyanosis: No Excoriation: No Ecchymosis: No Induration: No Erythema: Yes Rash: No Erythema Location: Circumferential Scarring: No Hemosiderin Staining: No Mottled: No Moisture Pallor: No No Abnormalities Noted: No Rubor: No Dry / Scaly: No Maceration: No Temperature / Pain Temperature: Hot Tenderness on Palpation: Yes Wound Preparation Ulcer Cleansing: Rinsed/Irrigated with Saline Topical Anesthetic Applied: Other: lidocaine 4%, Treatment Notes Wound #1 (Left Foot) 1. Cleansed with: Clean wound with Normal Saline 2. Anesthetic Topical Lidocaine 4% cream to wound bed prior to debridement 4. Dressing Applied: Aquacel Ag 5. Secondary Dressing Applied Gauze and Kerlix/Conform 7. Secured  with Tape Notes secured lightly with coban Electronic Signature(s) Signed: 12/24/2016 11:31:51 AM By: Montey Hora Entered By: Montey Hora on 12/24/2016 08:35:05 Donna Fisher ([id number]) -------------------------------------------------------------------------------- Vitals Details Patient Name: Donna Fisher Date of Service: 12/24/2016 8:00 AM Medical Record Number: [id number] Patient Account Number: [id number] Date of Birth/Sex: [birth year] (51 y.o. Female) Treating RN: Montey Hora Primary Care Jamel Holzmann: PATIENT, NO Other Clinician: Referring Vincente Asbridge: Treating Omere Marti/Extender: Frann Rider in Treatment: 0 Vital Signs Time Taken: 08:09 Temperature (F): 98.2 Height (in): 64 Pulse (bpm): 106 Source: Measured Respiratory Rate (breaths/min): 16 Weight (lbs): 162 Blood Pressure (mmHg): 106/66 Source: Measured Reference Range: 80 - 120 mg / dl Body Mass Index (  BMI): 27.8 Electronic Signature(s) Signed: 12/24/2016 11:31:51 AM By: Montey Hora Entered By: Montey Hora on 12/24/2016 08:11:08

## 2016-12-31 ENCOUNTER — Emergency Department: Payer: Medicaid Other

## 2016-12-31 ENCOUNTER — Inpatient Hospital Stay
Admission: EM | Admit: 2016-12-31 | Discharge: 2017-01-13 | DRG: 854 | Disposition: A | Discharge status: Home / Self Care | Payer: Medicaid Other | Attending: Internal Medicine | Admitting: Internal Medicine

## 2016-12-31 ENCOUNTER — Encounter
Admission: EM | Disposition: A | Discharge status: Home / Self Care | Payer: Self-pay | Source: Home / Self Care | Attending: Internal Medicine

## 2016-12-31 ENCOUNTER — Inpatient Hospital Stay: Payer: Medicaid Other | Admitting: Anesthesiology

## 2016-12-31 ENCOUNTER — Encounter: Payer: Medicaid Other | Admitting: Surgery

## 2016-12-31 DIAGNOSIS — E1140 Type 2 diabetes mellitus with diabetic neuropathy, unspecified: Secondary | ICD-10-CM | POA: Diagnosis not present

## 2016-12-31 DIAGNOSIS — E876 Hypokalemia: Secondary | ICD-10-CM | POA: Diagnosis not present

## 2016-12-31 DIAGNOSIS — A419 Sepsis, unspecified organism: Principal | ICD-10-CM

## 2016-12-31 DIAGNOSIS — E1165 Type 2 diabetes mellitus with hyperglycemia: Secondary | ICD-10-CM | POA: Diagnosis present

## 2016-12-31 DIAGNOSIS — E11621 Type 2 diabetes mellitus with foot ulcer: Secondary | ICD-10-CM | POA: Diagnosis present

## 2016-12-31 DIAGNOSIS — F411 Generalized anxiety disorder: Secondary | ICD-10-CM | POA: Diagnosis present

## 2016-12-31 DIAGNOSIS — F17218 Nicotine dependence, cigarettes, with other nicotine-induced disorders: Secondary | ICD-10-CM | POA: Diagnosis not present

## 2016-12-31 DIAGNOSIS — N179 Acute kidney failure, unspecified: Secondary | ICD-10-CM | POA: Diagnosis present

## 2016-12-31 DIAGNOSIS — L97529 Non-pressure chronic ulcer of other part of left foot with unspecified severity: Secondary | ICD-10-CM | POA: Diagnosis present

## 2016-12-31 DIAGNOSIS — E785 Hyperlipidemia, unspecified: Secondary | ICD-10-CM | POA: Diagnosis not present

## 2016-12-31 DIAGNOSIS — R6250 Unspecified lack of expected normal physiological development in childhood: Secondary | ICD-10-CM | POA: Diagnosis present

## 2016-12-31 DIAGNOSIS — E11628 Type 2 diabetes mellitus with other skin complications: Secondary | ICD-10-CM | POA: Diagnosis present

## 2016-12-31 DIAGNOSIS — Z794 Long term (current) use of insulin: Secondary | ICD-10-CM | POA: Diagnosis not present

## 2016-12-31 DIAGNOSIS — Z885 Allergy status to narcotic agent status: Secondary | ICD-10-CM | POA: Diagnosis not present

## 2016-12-31 DIAGNOSIS — Z79899 Other long term (current) drug therapy: Secondary | ICD-10-CM | POA: Diagnosis not present

## 2016-12-31 DIAGNOSIS — Z880 Allergy status to penicillin: Secondary | ICD-10-CM | POA: Diagnosis not present

## 2016-12-31 DIAGNOSIS — F17210 Nicotine dependence, cigarettes, uncomplicated: Secondary | ICD-10-CM | POA: Diagnosis present

## 2016-12-31 DIAGNOSIS — M869 Osteomyelitis, unspecified: Secondary | ICD-10-CM

## 2016-12-31 DIAGNOSIS — L03116 Cellulitis of left lower limb: Secondary | ICD-10-CM | POA: Diagnosis present

## 2016-12-31 DIAGNOSIS — L97523 Non-pressure chronic ulcer of other part of left foot with necrosis of muscle: Secondary | ICD-10-CM | POA: Diagnosis not present

## 2016-12-31 DIAGNOSIS — E1151 Type 2 diabetes mellitus with diabetic peripheral angiopathy without gangrene: Secondary | ICD-10-CM

## 2016-12-31 HISTORY — DX: Type 2 diabetes mellitus with diabetic neuropathy, unspecified: E11.40

## 2016-12-31 HISTORY — PX: INCISION AND DRAINAGE: SHX5863

## 2016-12-31 LAB — URINALYSIS, ROUTINE W REFLEX MICROSCOPIC
BACTERIA UA: NONE SEEN
Bilirubin Urine: NEGATIVE
Glucose, UA: >=500 mg/dL — AB
Hgb urine dipstick: NEGATIVE
KETONES UR: NEGATIVE mg/dL
Leukocytes, UA: NEGATIVE
Nitrite: NEGATIVE
PROTEIN: 30 mg/dL — AB
RBC / HPF: NONE SEEN RBC/hpf (ref 0–5)
Specific Gravity, Urine: 1.029 (ref 1.005–1.030)
WBC, UA: NONE SEEN WBC/hpf (ref 0–5)
pH: 5 (ref 5.0–8.0)

## 2016-12-31 LAB — COMPREHENSIVE METABOLIC PANEL
ALK PHOS: 126 U/L (ref 38–126)
ALT: 20 U/L (ref 14–54)
ANION GAP: 12 (ref 5–15)
AST: 26 U/L (ref 15–41)
Albumin: 3.1 g/dL — ABNORMAL LOW (ref 3.5–5.0)
BUN: 31 mg/dL — ABNORMAL HIGH (ref 6–20)
CALCIUM: 9.1 mg/dL (ref 8.9–10.3)
CO2: 26 mmol/L (ref 22–32)
Chloride: 95 mmol/L — ABNORMAL LOW (ref 101–111)
Creatinine, Ser: 1.37 mg/dL — ABNORMAL HIGH (ref 0.44–1.00)
GFR calc Af Amer: 51 mL/min — ABNORMAL LOW (ref >60)
GFR calc non Af Amer: 44 mL/min — ABNORMAL LOW (ref >60)
GLUCOSE: 344 mg/dL — AB (ref 65–99)
POTASSIUM: 3.8 mmol/L (ref 3.5–5.1)
Sodium: 133 mmol/L — ABNORMAL LOW (ref 135–145)
Total Bilirubin: 1.1 mg/dL (ref 0.3–1.2)
Total Protein: 9.3 g/dL — ABNORMAL HIGH (ref 6.5–8.1)

## 2016-12-31 LAB — CBC WITH DIFFERENTIAL/PLATELET
Basophils Absolute: 0.1 10*3/uL (ref 0–0.1)
Basophils Relative: 0 %
Eosinophils Absolute: 0 10*3/uL (ref 0–0.7)
Eosinophils Relative: 0 %
HEMATOCRIT: 36 % (ref 35.0–47.0)
Hemoglobin: 12.4 g/dL (ref 12.0–16.0)
LYMPHS ABS: 0.9 10*3/uL — AB (ref 1.0–3.6)
LYMPHS PCT: 3 %
MCH: 30.3 pg (ref 26.0–34.0)
MCHC: 34.4 g/dL (ref 32.0–36.0)
MCV: 88 fL (ref 80.0–100.0)
MONO ABS: 1.7 10*3/uL — AB (ref 0.2–0.9)
MONOS PCT: 6 %
NEUTROS ABS: 26.3 10*3/uL — AB (ref 1.4–6.5)
Neutrophils Relative %: 91 %
Platelets: 342 10*3/uL (ref 150–440)
RBC: 4.09 MIL/uL (ref 3.80–5.20)
RDW: 12.5 % (ref 11.5–14.5)
WBC: 28.9 10*3/uL — ABNORMAL HIGH (ref 3.6–11.0)

## 2016-12-31 LAB — SURGICAL PCR SCREEN
MRSA, PCR: NEGATIVE
STAPHYLOCOCCUS AUREUS: NEGATIVE

## 2016-12-31 LAB — LACTIC ACID, PLASMA
Lactic Acid, Venous: 2.1 mmol/L (ref 0.5–1.9)
Lactic Acid, Venous: 2.2 mmol/L (ref 0.5–1.9)

## 2016-12-31 LAB — GLUCOSE, CAPILLARY
GLUCOSE-CAPILLARY: 288 mg/dL — AB (ref 65–99)
GLUCOSE-CAPILLARY: 274 mg/dL — AB (ref 65–99)
Glucose-Capillary: 347 mg/dL — ABNORMAL HIGH (ref 65–99)

## 2016-12-31 SURGERY — INCISION AND DRAINAGE
Anesthesia: General | Site: Foot | Laterality: Left | Wound class: Dirty or Infected

## 2016-12-31 MED ORDER — INSULIN GLARGINE 100 UNIT/ML ~~LOC~~ SOLN
12.0000 [IU] | Freq: Every day | SUBCUTANEOUS | Status: DC
  Administered 2016-12-31 – 2017-01-01 (×2): 12 [IU] via SUBCUTANEOUS
  Filled 2016-12-31 (×2): qty 0.12

## 2016-12-31 MED ORDER — LIDOCAINE HCL (PF) 2 % IJ SOLN
INTRAMUSCULAR | Status: AC
  Filled 2016-12-31: qty 2

## 2016-12-31 MED ORDER — SODIUM CHLORIDE 0.9 % IV SOLN
1.0000 g | Freq: Two times a day (BID) | INTRAVENOUS | Status: DC
  Administered 2016-12-31 – 2017-01-03 (×6): 1 g via INTRAVENOUS
  Filled 2016-12-31 (×7): qty 1

## 2016-12-31 MED ORDER — POLYETHYLENE GLYCOL 3350 17 G PO PACK
17.0000 g | PACK | Freq: Every day | ORAL | Status: DC | PRN
  Administered 2017-01-05: 17 g via ORAL
  Filled 2016-12-31: qty 1

## 2016-12-31 MED ORDER — LACTATED RINGERS IV SOLN
INTRAVENOUS | Status: DC | PRN
  Administered 2016-12-31: 16:00:00 via INTRAVENOUS

## 2016-12-31 MED ORDER — MIDAZOLAM HCL 5 MG/5ML IJ SOLN
INTRAMUSCULAR | Status: DC | PRN
  Administered 2016-12-31: 2 mg via INTRAVENOUS

## 2016-12-31 MED ORDER — PROMETHAZINE HCL 25 MG/ML IJ SOLN: 6.2500 mg | INTRAMUSCULAR | Status: DC | PRN

## 2016-12-31 MED ORDER — DEXTROSE 5 % IV SOLN
2.0000 g | Freq: Once | INTRAVENOUS | Status: AC
  Administered 2016-12-31: 2 g via INTRAVENOUS
  Filled 2016-12-31: qty 2

## 2016-12-31 MED ORDER — HYDROCODONE-ACETAMINOPHEN 5-325 MG PO TABS
1.0000 | ORAL_TABLET | ORAL | Status: DC | PRN
  Administered 2016-12-31: 1 via ORAL
  Administered 2016-12-31 – 2017-01-01 (×2): 2 via ORAL
  Administered 2017-01-01 – 2017-01-02 (×2): 1 via ORAL
  Administered 2017-01-02: 2 via ORAL
  Administered 2017-01-06: 1 via ORAL
  Filled 2016-12-31 (×2): qty 1
  Filled 2016-12-31: qty 2
  Filled 2016-12-31: qty 1
  Filled 2016-12-31 (×2): qty 2
  Filled 2016-12-31 (×2): qty 1

## 2016-12-31 MED ORDER — ONDANSETRON HCL 4 MG/2ML IJ SOLN
4.0000 mg | Freq: Once | INTRAMUSCULAR | Status: AC
  Administered 2016-12-31: 4 mg via INTRAVENOUS

## 2016-12-31 MED ORDER — SODIUM CHLORIDE 0.9 % IV BOLUS (SEPSIS)
1000.0000 mL | Freq: Once | INTRAVENOUS | Status: AC
  Administered 2016-12-31: 1000 mL via INTRAVENOUS

## 2016-12-31 MED ORDER — FENTANYL CITRATE (PF) 100 MCG/2ML IJ SOLN: 25.0000 ug | INTRAMUSCULAR | Status: DC | PRN

## 2016-12-31 MED ORDER — ONDANSETRON HCL 4 MG/2ML IJ SOLN
INTRAMUSCULAR | Status: AC
  Filled 2016-12-31: qty 2

## 2016-12-31 MED ORDER — FENTANYL CITRATE (PF) 100 MCG/2ML IJ SOLN
INTRAMUSCULAR | Status: AC
Start: 2016-12-31 — End: ?
  Filled 2016-12-31: qty 2

## 2016-12-31 MED ORDER — SODIUM CHLORIDE 0.9 % IV BOLUS (SEPSIS)
250.0000 mL | Freq: Once | INTRAVENOUS | Status: AC
  Administered 2016-12-31: 250 mL via INTRAVENOUS

## 2016-12-31 MED ORDER — INSULIN ASPART 100 UNIT/ML ~~LOC~~ SOLN
0.0000 [IU] | Freq: Three times a day (TID) | SUBCUTANEOUS | Status: DC
  Administered 2016-12-31 – 2017-01-01 (×2): 8 [IU] via SUBCUTANEOUS
  Administered 2017-01-01 (×2): 11 [IU] via SUBCUTANEOUS
  Administered 2017-01-02: 8 [IU] via SUBCUTANEOUS
  Administered 2017-01-02 – 2017-01-04 (×4): 3 [IU] via SUBCUTANEOUS
  Administered 2017-01-04: 5 [IU] via SUBCUTANEOUS
  Administered 2017-01-05 (×2): 2 [IU] via SUBCUTANEOUS
  Administered 2017-01-06 – 2017-01-07 (×3): 8 [IU] via SUBCUTANEOUS
  Administered 2017-01-07: 11 [IU] via SUBCUTANEOUS
  Administered 2017-01-08: 8 [IU] via SUBCUTANEOUS
  Administered 2017-01-08 (×2): 15 [IU] via SUBCUTANEOUS
  Administered 2017-01-09: 8 [IU] via SUBCUTANEOUS
  Administered 2017-01-09: 5 [IU] via SUBCUTANEOUS
  Administered 2017-01-09: 2 [IU] via SUBCUTANEOUS
  Administered 2017-01-10: 15 [IU] via SUBCUTANEOUS
  Administered 2017-01-10 (×2): 8 [IU] via SUBCUTANEOUS
  Administered 2017-01-11: 3 [IU] via SUBCUTANEOUS
  Administered 2017-01-11: 5 [IU] via SUBCUTANEOUS
  Administered 2017-01-12: 8 [IU] via SUBCUTANEOUS
  Administered 2017-01-12: 5 [IU] via SUBCUTANEOUS
  Administered 2017-01-12: 3 [IU] via SUBCUTANEOUS
  Administered 2017-01-13 (×2): 2 [IU] via SUBCUTANEOUS
  Filled 2016-12-31 (×19): qty 1
  Filled 2016-12-31: qty 8
  Filled 2016-12-31 (×13): qty 1

## 2016-12-31 MED ORDER — PROPOFOL 10 MG/ML IV BOLUS
INTRAVENOUS | Status: DC | PRN
  Administered 2016-12-31: 120 mg via INTRAVENOUS

## 2016-12-31 MED ORDER — VANCOMYCIN HCL IN DEXTROSE 1-5 GM/200ML-% IV SOLN
1000.0000 mg | Freq: Once | INTRAVENOUS | Status: AC
  Administered 2016-12-31: 1000 mg via INTRAVENOUS
  Filled 2016-12-31: qty 200

## 2016-12-31 MED ORDER — GLYCOPYRROLATE 0.2 MG/ML IJ SOLN
INTRAMUSCULAR | Status: DC | PRN
  Administered 2016-12-31: 0.2 mg via INTRAVENOUS

## 2016-12-31 MED ORDER — ONDANSETRON HCL 4 MG/2ML IJ SOLN
INTRAMUSCULAR | Status: DC | PRN
  Administered 2016-12-31: 4 mg via INTRAVENOUS

## 2016-12-31 MED ORDER — ACETAMINOPHEN 650 MG RE SUPP: 650.0000 mg | Freq: Four times a day (QID) | RECTAL | Status: DC | PRN

## 2016-12-31 MED ORDER — LIDOCAINE HCL (PF) 1 % IJ SOLN
INTRAMUSCULAR | Status: DC | PRN
  Administered 2016-12-31: 5 mL

## 2016-12-31 MED ORDER — ONDANSETRON HCL 4 MG PO TABS
4.0000 mg | ORAL_TABLET | Freq: Four times a day (QID) | ORAL | Status: DC | PRN
  Administered 2017-01-05: 4 mg via ORAL
  Filled 2016-12-31: qty 1

## 2016-12-31 MED ORDER — PROPOFOL 10 MG/ML IV BOLUS
INTRAVENOUS | Status: AC
  Filled 2016-12-31: qty 20

## 2016-12-31 MED ORDER — FENTANYL CITRATE (PF) 100 MCG/2ML IJ SOLN
50.0000 ug | Freq: Once | INTRAMUSCULAR | Status: AC
  Administered 2016-12-31: 50 ug via INTRAVENOUS
  Filled 2016-12-31: qty 2

## 2016-12-31 MED ORDER — MIDAZOLAM HCL 2 MG/2ML IJ SOLN
INTRAMUSCULAR | Status: AC
  Filled 2016-12-31: qty 2

## 2016-12-31 MED ORDER — ONDANSETRON HCL 4 MG/2ML IJ SOLN
INTRAMUSCULAR | Status: AC
  Administered 2016-12-31: 4 mg via INTRAVENOUS
  Filled 2016-12-31: qty 2

## 2016-12-31 MED ORDER — GLYCOPYRROLATE 0.2 MG/ML IJ SOLN
INTRAMUSCULAR | Status: AC
  Filled 2016-12-31: qty 1

## 2016-12-31 MED ORDER — DEXAMETHASONE SODIUM PHOSPHATE 10 MG/ML IJ SOLN
INTRAMUSCULAR | Status: DC | PRN
  Administered 2016-12-31: 4 mg via INTRAVENOUS

## 2016-12-31 MED ORDER — PHENYLEPHRINE HCL 10 MG/ML IJ SOLN
INTRAMUSCULAR | Status: DC | PRN
  Administered 2016-12-31 (×2): 200 ug via INTRAVENOUS
  Administered 2016-12-31 (×2): 100 ug via INTRAVENOUS
  Administered 2016-12-31 (×3): 200 ug via INTRAVENOUS

## 2016-12-31 MED ORDER — DOCUSATE SODIUM 100 MG PO CAPS
100.0000 mg | ORAL_CAPSULE | Freq: Two times a day (BID) | ORAL | Status: DC
  Administered 2016-12-31 – 2017-01-12 (×23): 100 mg via ORAL
  Filled 2016-12-31 (×25): qty 1

## 2016-12-31 MED ORDER — METRONIDAZOLE IN NACL 5-0.79 MG/ML-% IV SOLN
500.0000 mg | Freq: Once | INTRAVENOUS | Status: AC
  Administered 2016-12-31: 500 mg via INTRAVENOUS
  Filled 2016-12-31: qty 100

## 2016-12-31 MED ORDER — VANCOMYCIN HCL 10 G IV SOLR: 1250.0000 mg | INTRAVENOUS | Status: DC

## 2016-12-31 MED ORDER — VANCOMYCIN HCL 10 G IV SOLR
1250.0000 mg | INTRAVENOUS | Status: DC
  Administered 2017-01-01 – 2017-01-02 (×3): 1250 mg via INTRAVENOUS
  Filled 2016-12-31 (×5): qty 1250

## 2016-12-31 MED ORDER — ONDANSETRON HCL 4 MG/2ML IJ SOLN: 4.0000 mg | Freq: Four times a day (QID) | INTRAMUSCULAR | Status: DC | PRN

## 2016-12-31 MED ORDER — BUPIVACAINE HCL (PF) 0.5 % IJ SOLN
INTRAMUSCULAR | Status: DC | PRN
  Administered 2016-12-31: 5 mL

## 2016-12-31 MED ORDER — ACETAMINOPHEN 325 MG PO TABS
650.0000 mg | ORAL_TABLET | Freq: Four times a day (QID) | ORAL | Status: DC | PRN
  Administered 2017-01-04: 650 mg via ORAL
  Filled 2016-12-31: qty 2

## 2016-12-31 MED ORDER — LIDOCAINE HCL (PF) 2 % IJ SOLN
INTRAMUSCULAR | Status: DC | PRN
  Administered 2016-12-31: 50 mg

## 2016-12-31 MED ORDER — ALBUTEROL SULFATE (2.5 MG/3ML) 0.083% IN NEBU: 2.5000 mg | INHALATION_SOLUTION | RESPIRATORY_TRACT | Status: DC | PRN

## 2016-12-31 MED ORDER — INSULIN ASPART 100 UNIT/ML ~~LOC~~ SOLN
0.0000 [IU] | Freq: Every day | SUBCUTANEOUS | Status: DC
  Administered 2016-12-31: 4 [IU] via SUBCUTANEOUS
  Administered 2017-01-01: 5 [IU] via SUBCUTANEOUS
  Administered 2017-01-04: 3 [IU] via SUBCUTANEOUS
  Administered 2017-01-06: 4 [IU] via SUBCUTANEOUS
  Administered 2017-01-07 – 2017-01-08 (×2): 5 [IU] via SUBCUTANEOUS
  Administered 2017-01-09: 2 [IU] via SUBCUTANEOUS
  Administered 2017-01-10 – 2017-01-11 (×2): 3 [IU] via SUBCUTANEOUS
  Administered 2017-01-12: 2 [IU] via SUBCUTANEOUS
  Filled 2016-12-31 (×10): qty 1

## 2016-12-31 MED ORDER — OXYCODONE HCL 5 MG PO TABS: 5.0000 mg | ORAL_TABLET | Freq: Once | ORAL | Status: DC | PRN

## 2016-12-31 MED ORDER — POTASSIUM CHLORIDE IN NACL 20-0.9 MEQ/L-% IV SOLN
INTRAVENOUS | Status: AC
  Administered 2016-12-31: 1000 mL via INTRAVENOUS
  Filled 2016-12-31: qty 1000

## 2016-12-31 MED ORDER — FENTANYL CITRATE (PF) 100 MCG/2ML IJ SOLN
INTRAMUSCULAR | Status: DC | PRN
  Administered 2016-12-31: 50 ug via INTRAVENOUS
  Administered 2016-12-31 (×2): 25 ug via INTRAVENOUS

## 2016-12-31 MED ORDER — MEPERIDINE HCL 25 MG/ML IJ SOLN: 6.2500 mg | INTRAMUSCULAR | Status: DC | PRN

## 2016-12-31 MED ORDER — OXYCODONE HCL 5 MG/5ML PO SOLN: 5.0000 mg | Freq: Once | ORAL | Status: DC | PRN

## 2016-12-31 MED ORDER — POTASSIUM CHLORIDE IN NACL 20-0.9 MEQ/L-% IV SOLN
INTRAVENOUS | Status: DC
  Administered 2016-12-31: 20:00:00 via INTRAVENOUS
  Administered 2016-12-31: 1000 mL via INTRAVENOUS
  Administered 2017-01-01: 05:00:00 via INTRAVENOUS
  Filled 2016-12-31 (×3): qty 1000

## 2016-12-31 MED ORDER — DEXAMETHASONE SODIUM PHOSPHATE 10 MG/ML IJ SOLN
INTRAMUSCULAR | Status: AC
  Filled 2016-12-31: qty 1

## 2016-12-31 SURGICAL SUPPLY — 38 items
BAG COUNTER SPONGE EZ (MISCELLANEOUS) IMPLANT
BANDAGE ELASTIC 3 LF NS (GAUZE/BANDAGES/DRESSINGS) ×3 IMPLANT
BANDAGE ELASTIC 4 LF NS (GAUZE/BANDAGES/DRESSINGS) ×3 IMPLANT
BANDAGE STRETCH 3X4.1 STRL (GAUZE/BANDAGES/DRESSINGS) ×3 IMPLANT
BNDG ESMARK 4X12 TAN STRL LF (GAUZE/BANDAGES/DRESSINGS) ×3 IMPLANT
BNDG GAUZE 4.5X4.1 6PLY STRL (MISCELLANEOUS) ×3 IMPLANT
CANISTER SUCT 1200ML W/VALVE (MISCELLANEOUS) ×3 IMPLANT
COUNTER SPONGE BAG EZ (MISCELLANEOUS)
CUFF TOURN 18 STER (MISCELLANEOUS) ×3 IMPLANT
CUFF TOURN DUAL PL 12 NO SLV (MISCELLANEOUS) IMPLANT
DRAPE FLUOR MINI C-ARM 54X84 (DRAPES) ×3 IMPLANT
DURAPREP 26ML APPLICATOR (WOUND CARE) ×3 IMPLANT
ELECT REM PT RETURN 9FT ADLT (ELECTROSURGICAL) ×3
ELECTRODE REM PT RTRN 9FT ADLT (ELECTROSURGICAL) ×1 IMPLANT
GAUZE PETRO XEROFOAM 1X8 (MISCELLANEOUS) ×3 IMPLANT
GAUZE SPONGE 4X4 12PLY STRL (GAUZE/BANDAGES/DRESSINGS) ×3 IMPLANT
GLOVE BIO SURGEON STRL SZ8 (GLOVE) ×3 IMPLANT
GLOVE INDICATOR 8.0 STRL GRN (GLOVE) ×3 IMPLANT
GOWN STRL REUS W/ TWL LRG LVL3 (GOWN DISPOSABLE) ×1 IMPLANT
GOWN STRL REUS W/TWL LRG LVL3 (GOWN DISPOSABLE) ×2
KIT RM TURNOVER STRD PROC AR (KITS) ×3 IMPLANT
LABEL OR SOLS (LABEL) ×3 IMPLANT
NEEDLE FILTER BLUNT 18X 1/2SAF (NEEDLE) ×2
NEEDLE FILTER BLUNT 18X1 1/2 (NEEDLE) ×1 IMPLANT
NEEDLE HYPO 25X1 1.5 SAFETY (NEEDLE) ×6 IMPLANT
NS IRRIG 500ML POUR BTL (IV SOLUTION) ×3 IMPLANT
PACK EXTREMITY ARMC (MISCELLANEOUS) ×3 IMPLANT
STOCKINETTE STRL 6IN 960660 (GAUZE/BANDAGES/DRESSINGS) ×3 IMPLANT
SUT ETHILON 3-0 FS-10 30 BLK (SUTURE) ×3
SUT ETHILON 4-0 (SUTURE) ×2
SUT ETHILON 4-0 FS2 18XMFL BLK (SUTURE) ×1
SUT VIC AB 3-0 SH 27 (SUTURE) ×2
SUT VIC AB 3-0 SH 27X BRD (SUTURE) ×1 IMPLANT
SUT VIC AB 4-0 FS2 27 (SUTURE) ×3 IMPLANT
SUTURE EHLN 3-0 FS-10 30 BLK (SUTURE) ×1 IMPLANT
SUTURE ETHLN 4-0 FS2 18XMF BLK (SUTURE) ×1 IMPLANT
SYR 3ML LL SCALE MARK (SYRINGE) ×6 IMPLANT
SYRINGE 10CC LL (SYRINGE) ×3 IMPLANT

## 2016-12-31 NOTE — H&P (Signed)
H and P has been reviewed and no changes are noted.  

## 2016-12-31 NOTE — ED Notes (Signed)
Pt followed by wound clinic for wound to bottom left foot. Pt started on cipro outpatient this week, infection worsening. Red, painful.

## 2016-12-31 NOTE — Progress Notes (Signed)
Pharmacy Antibiotic Note  Donna Fisher is a 51 y.o. female admitted on 12/31/2016 with cellulitis and possible abscess.  Pharmacy has been consulted for vancomycin and meropenem dosing. Patient with history of chronic left foot ulcer. Patient meets criteria for sepsis.   Plan: Patient received vancomycin 1g IV at 1113 in ED. Will start patient on vancomycin 1250mg  IV Q24hr for goal trough of 15-20. Will order next dose to be given at 1630. Will obtain trough prior to vancomycin dose on 6/18. Will change trough goal to 10-15 if abscess is ruled out.   Patient received aztreonam and metronidazole x 1 in ED. Will initiate patient on meropenem 1g IV Q12hr. Patient meets health system criteria for receiving cephalosporin. Recommend narrowing therapy as appropriate.   Height: 5\' 5"  (165.1 cm) Weight: 150 lb (68 kg) IBW/kg (Calculated) : 57  Temp (24hrs), Avg:98.7 F (37.1 C), Min:98.5 F (36.9 C), Max:98.9 F (37.2 C)   Recent Labs Lab 12/31/16 1020  WBC 28.9*  CREATININE 1.37*  LATICACIDVEN 2.1*    Estimated Creatinine Clearance: 44.2 mL/min (A) (by C-G formula based on SCr of 1.37 mg/dL (H)).    Allergies  Allergen Reactions  . Penicillins Swelling    .Has patient had a PCN reaction causing immediate rash, facial/tongue/throat swelling, SOB or lightheadedness with hypotension: No Has patient had a PCN reaction causing severe rash involving mucus membranes or skin necrosis: No Has patient had a PCN reaction that required hospitalization: No Has patient had a PCN reaction occurring within the last 10 years: No If all of the above answers are "NO", then may proceed with Cephalosporin use.   . Codeine Itching    Antimicrobials this admission: Aztreonam/Metronidazole 6/15 x 1.  Meropenem 6/15 >>  Vancomycin 6/15 >>   Dose adjustments this admission: N/A  Microbiology results: 6/15 BCx: pending  6/15 UCx: pending   6/15 HIV: pending   Thank you for allowing pharmacy to be  a part of this patient's care.  Shia Delaine L 12/31/2016 12:05 PM

## 2016-12-31 NOTE — Anesthesia Post-op Follow-up Note (Cosign Needed)
Anesthesia QCDR form completed.        

## 2016-12-31 NOTE — Anesthesia Preprocedure Evaluation (Signed)
Anesthesia Evaluation  Patient identified by MRN, date of birth, ID band Patient awake    Reviewed: Allergy & Precautions, NPO status , Patient's Chart, lab work & pertinent test results  History of Anesthesia Complications Negative for: history of anesthetic complications (reports one instance of slow wake up after hysterectomy)  Airway Mallampati: III  TM Distance: >3 FB Neck ROM: Full    Dental  (+) Poor Dentition   Pulmonary neg sleep apnea, neg COPD, Current Smoker,    breath sounds clear to auscultation- rhonchi (-) wheezing      Cardiovascular Exercise Tolerance: Good (-) hypertension(-) CAD and (-) Past MI  Rhythm:Regular Rate:Normal - Systolic murmurs and - Diastolic murmurs    Neuro/Psych  Headaches, Anxiety    GI/Hepatic negative GI ROS, Neg liver ROS,   Endo/Other  diabetes, Insulin Dependent  Renal/GU negative Renal ROS     Musculoskeletal negative musculoskeletal ROS (+)   Abdominal (+) - obese,   Peds  Hematology negative hematology ROS (+)   Anesthesia Other Findings Past Medical History: No date: Anxiety state, unspecified No date: Diabetic neuropathy ([place]) No date: Genital herpes, unspecified No date: Headache(784.0) No date: Other and unspecified hyperlipidemia No date: Pain in joint, pelvic region and thigh No date: Type II or unspecified type diabetes mellitus * No date: Urinary tract infection, site not specified No date: Vaginitis and vulvovaginitis, unspecified No date: Vaginitis and vulvovaginitis, unspecified   Reproductive/Obstetrics                             Anesthesia Physical Anesthesia Plan  ASA: III  Anesthesia Plan: General   Post-op Pain Management:    Induction: Intravenous  PONV Risk Score and Plan: 1 and Ondansetron, Dexamethasone, Propofol and Midazolam  Airway Management Planned: LMA  Additional Equipment:   Intra-op Plan:    Post-operative Plan:   Informed Consent: I have reviewed the patients History and Physical, chart, labs and discussed the procedure including the risks, benefits and alternatives for the proposed anesthesia with the patient or authorized representative who has indicated his/her understanding and acceptance.   Dental advisory given  Plan Discussed with: CRNA and Anesthesiologist  Anesthesia Plan Comments:         Anesthesia Quick Evaluation

## 2016-12-31 NOTE — Anesthesia Postprocedure Evaluation (Signed)
Anesthesia Post Note  Patient: Donna Fisher  Procedure(s) Performed: Procedure(s) (LRB): INCISION AND DRAINAGE LEFT FOOT (Left)  Patient location during evaluation: PACU Anesthesia Type: General Level of consciousness: awake and alert and oriented Pain management: pain level controlled Vital Signs Assessment: post-procedure vital signs reviewed and stable Respiratory status: spontaneous breathing, nonlabored ventilation and respiratory function stable Cardiovascular status: blood pressure returned to baseline and stable Postop Assessment: no signs of nausea or vomiting Anesthetic complications: no     Last Vitals:  Vitals:   12/31/16 1740 12/31/16 1750  BP:  (!) 92/58  Pulse: 89 90  Resp: 11 14  Temp:      Last Pain:  Vitals:   12/31/16 1735  TempSrc:   PainSc: 0-No pain                 Sequoia Mincey

## 2016-12-31 NOTE — Anesthesia Procedure Notes (Signed)
Procedure Name: LMA Insertion Performed by: Kevyn Wengert Pre-anesthesia Checklist: Patient identified, Patient being monitored, Timeout performed, Emergency Drugs available and Suction available Patient Re-evaluated:Patient Re-evaluated prior to inductionOxygen Delivery Method: Circle system utilized Preoxygenation: Pre-oxygenation with 100% oxygen Intubation Type: IV induction Ventilation: Mask ventilation without difficulty LMA: LMA inserted LMA Size: 3.5 Tube type: Oral Number of attempts: 1 Placement Confirmation: positive ETCO2 and breath sounds checked- equal and bilateral Tube secured with: Tape Dental Injury: Teeth and Oropharynx as per pre-operative assessment        

## 2016-12-31 NOTE — Op Note (Signed)
Operative note   Surgeon: Dr. Albertine Patricia, DPM.    Assistant:none    Preop diagnosis: Deep abscess plantar left foot with cellulitis secondary to chronic diabetic ulceration    Postop diagnosis: Same    Procedure:   1. Incision and drainage below the fascial layer plantar left foot          EBL: 15 cc    Anesthesia:general    Hemostasis: Ankle tourniquet at 250 mils mercury pressure for 35-40 minutes    Specimen: Necrotic plantar fascial and tendinous tissue from the plantar deep spaces    Complications: None    Operative indications: Cellulitis with deep abscess and purulent tracking to the plantar left foot with a white count of 28,000    Procedure:  Patient was brought into the OR and placed on the operating table in thesupine position. After anesthesia was obtained theleft lower extremity was prepped and draped in usual sterile fashion.  Operative Report: At this time attention was directed to the plantar left foot. The patient was placed in Trendelenburg position. An ulceration was noted in the third metatarsal head which probe down to the capsule tissue but did not appear to penetrate through to the bone. Incision was started at this area and carried proximally through an area where a abscess was starting to become more superficial eyes and there was fluctuant tissue in the central and medial aspects of the left foot. The cellulitis extended up into the medial arch. An incision was carried through this area as well the total incision margin was about 10-12 cm. This point the incision was deepened down to the plantar fascial layer. Extensive purulent drainage was encountered this was cultured and sent for evaluation. His time necrotic tissue was noted to be seen which involved most of the plantar fascial ligament. The plantar fascial ligament was then resected try and remove all necrotic infected portions of the ligament. The incision was then carried proximally and probed  there was a sinus tract progressing up the medial aspect of the heel but not down to the level of the tendons and that region. Once this was opened up the area was debrided with the versa jet set on level 2. The medial fascial layer was then opened up also to allow for better exposure and drainage. Once all purulence was seen to discontinue with drainage from the region attention was directed to the central portion where further portions of the plantar fascial ligament removed extending out to the area just proximal to the metatarsal heads. Once this portion ligament was removed there was purulence and was draining from the long flexor tendon and the short flexor tendon to the third toe. These were resected at this point and the tendon sheath were debrided with versa jet and cleaned out. Interosseous muscles were then probed to see if any purulent drainage was noted and these regions 1 area between the third and second metatarsals identified and opened up and irrigated copiously. This point the pulse lavage system was used to irrigate the entire course of the incision margin. Approximately 1000 cc of saline was used to irrigate this area. This time the tissues looked fairly clean and the tourniquet was released vascularity gradually returned but there were no pulsatile bleeders. There was some decent capillary bleeding to the region. The proximal portion incision was closed approximately 3 cm. The distal portion incision was closed proximally 2 cm. Since portion without wound was then packed with a wet-to-dry saline before gauze pads to  pads were used to pack the area. The sinus tract proximally was packed and also the area centrally and distally was packed to the muscle layers. The distal ulceration was also packed open. There is a central area of tissue damage that I'm concerned that may not heal she may end up getting a skin slough on these regions. I planned a Cathren Laine again tomorrow to keep this area open and  draining and I will see her again on Sunday if on Sunday she is looking a lot better I may do more closure at that time frame. She may need further debridement also. Dressing was completed with dry sterile 4 x 4's conformation AVD pads Kerlix and an Ace wrap.    Patient tolerated the procedure and anesthesia well.  Was transported from the OR to the PACU with all vital signs stable and vascular status intact. To be discharged per routine protocol.  Will follow up in approximately 1 week in the outpatient clinic.
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
## 2016-12-31 NOTE — ED Notes (Signed)
Report to [place], [place]

## 2016-12-31 NOTE — ED Triage Notes (Signed)
Pt sent from the wound center, states she has been on abx for an abscess/wound on the left foot and for the past 3 days has had a fever, wound worsening and is concerned about sepsis.

## 2016-12-31 NOTE — Progress Notes (Signed)
Inpatient Diabetes Program Recommendations  AACE/ADA: New Consensus Statement on Inpatient Glycemic Control (2015)  Target Ranges:  Prepandial:   less than 140 mg/dL      Peak postprandial:   less than 180 mg/dL (1-2 hours)      Critically ill patients:  140 - 180 mg/dL   Lab Results  Component Value Date   GLUCAP 274 (H) 12/31/2016   HGBA1C 12.0 (H) 11/03/2014    Review of Glycemic Control  Results for Donna Fisher, Donna Fisher (MRN [id number]) as of 12/31/2016 13:00  Ref. Range 12/31/2016 11:52 12/31/2016 12:48  Glucose-Capillary Latest Ref Range: 65 - 99 mg/dL 288 (H) 274 (H)    Diabetes history: Type 2 Outpatient Diabetes medications: none noted  Current orders for Inpatient glycemic control: Novolog 0-15 units tid, Novolog 0-5 units, Lantus 12 units qday  Inpatient Diabetes Program Recommendations:   Agree with current medications for blood sugar management. Once she begins eating, she will likely benefit from mealtime Novolog 3 units tid.   MD note states patient has not been taking her insulin, Lantus 30-40 units qday because of cost and no insurance.  For discharge, patient may benefit from NPH bid purchased at [facility] for around $25.00/bottle or assistance from [facility].   Gentry Fitz, RN, BA, MHA, CDE Diabetes Coordinator Inpatient Diabetes Program  [phone] (Team Pager) [phone] ([place]) 12/31/2016 1:11 PM
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
## 2016-12-31 NOTE — ED Notes (Signed)
Attempt to give report, nurse unavailable.
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
## 2016-12-31 NOTE — Transfer of Care (Signed)
Immediate Anesthesia Transfer of Care Note  Patient: Donna Fisher  Procedure(s) Performed: Procedure(s): INCISION AND DRAINAGE LEFT FOOT (Left)  Patient Location: PACU  Anesthesia Type:General  Level of Consciousness: sedated  Airway & Oxygen Therapy: Patient Spontanous Breathing and Patient connected to face mask oxygen  Post-op Assessment: Report given to RN and Post -op Vital signs reviewed and stable  Post vital signs: Reviewed  Last Vitals:  Vitals:   12/31/16 1720 12/31/16 1723  BP: (!) 89/53 (!) 89/64  Pulse: 87 87  Resp: 10 10  Temp: 37.1 C 37.1 C    Last Pain:  Vitals:   12/31/16 1246  TempSrc: Oral  PainSc: 8          Complications: No apparent anesthesia complications

## 2017-01-01 ENCOUNTER — Encounter: Payer: Self-pay | Admitting: Podiatry

## 2017-01-01 LAB — BASIC METABOLIC PANEL
ANION GAP: 7 (ref 5–15)
BUN: 26 mg/dL — ABNORMAL HIGH (ref 6–20)
CO2: 25 mmol/L (ref 22–32)
Calcium: 8 mg/dL — ABNORMAL LOW (ref 8.9–10.3)
Chloride: 102 mmol/L (ref 101–111)
Creatinine, Ser: 0.81 mg/dL (ref 0.44–1.00)
GFR calc non Af Amer: >60 mL/min (ref >60)
Glucose, Bld: 361 mg/dL — ABNORMAL HIGH (ref 65–99)
POTASSIUM: 4.6 mmol/L (ref 3.5–5.1)
SODIUM: 134 mmol/L — AB (ref 135–145)

## 2017-01-01 LAB — CBC
HEMATOCRIT: 31 % — AB (ref 35.0–47.0)
HEMOGLOBIN: 10.5 g/dL — AB (ref 12.0–16.0)
MCH: 29.6 pg (ref 26.0–34.0)
MCHC: 33.8 g/dL (ref 32.0–36.0)
MCV: 87.8 fL (ref 80.0–100.0)
Platelets: 279 10*3/uL (ref 150–440)
RBC: 3.54 MIL/uL — AB (ref 3.80–5.20)
RDW: 12.4 % (ref 11.5–14.5)
WBC: 26.9 10*3/uL — AB (ref 3.6–11.0)

## 2017-01-01 LAB — HEMOGLOBIN A1C
HEMOGLOBIN A1C: 11.1 % — AB (ref 4.8–5.6)
MEAN PLASMA GLUCOSE: 272 mg/dL

## 2017-01-01 LAB — GLUCOSE, CAPILLARY
GLUCOSE-CAPILLARY: 296 mg/dL — AB (ref 65–99)
GLUCOSE-CAPILLARY: 335 mg/dL — AB (ref 65–99)
GLUCOSE-CAPILLARY: 358 mg/dL — AB (ref 65–99)
Glucose-Capillary: 345 mg/dL — ABNORMAL HIGH (ref 65–99)

## 2017-01-01 LAB — URINE CULTURE: Culture: <10000 — AB

## 2017-01-01 LAB — PROCALCITONIN: Procalcitonin: 0.78 ng/mL

## 2017-01-01 MED ORDER — SODIUM CHLORIDE 0.9 % IV BOLUS (SEPSIS)
1000.0000 mL | Freq: Once | INTRAVENOUS | Status: AC
  Administered 2017-01-01: 1000 mL via INTRAVENOUS

## 2017-01-01 MED ORDER — MIDODRINE HCL 5 MG PO TABS
5.0000 mg | ORAL_TABLET | Freq: Three times a day (TID) | ORAL | Status: DC
  Administered 2017-01-02 – 2017-01-04 (×7): 5 mg via ORAL
  Filled 2017-01-01 (×7): qty 1

## 2017-01-01 MED ORDER — SODIUM CHLORIDE 0.9 % IV SOLN
INTRAVENOUS | Status: DC
  Administered 2017-01-01 – 2017-01-04 (×4): via INTRAVENOUS

## 2017-01-01 MED ORDER — ENOXAPARIN SODIUM 40 MG/0.4ML ~~LOC~~ SOLN
40.0000 mg | SUBCUTANEOUS | Status: DC
  Administered 2017-01-01 – 2017-01-12 (×10): 40 mg via SUBCUTANEOUS
  Filled 2017-01-01 (×11): qty 0.4

## 2017-01-01 MED ORDER — INSULIN DETEMIR 100 UNIT/ML ~~LOC~~ SOLN
25.0000 [IU] | Freq: Every day | SUBCUTANEOUS | Status: DC
  Administered 2017-01-01 – 2017-01-06 (×6): 25 [IU] via SUBCUTANEOUS
  Filled 2017-01-01 (×7): qty 0.25
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
## 2017-01-01 NOTE — Progress Notes (Signed)
Contacted Dr. Ulysees Barns regarding patients low bp and temp. 210PM bp 91/31 temp 94.6 gave 1039ml ns bolus. Again at 455pm bp 86/44 temp 94.6 gave another 107ml ns bolus and applied bear hugger. Continue to closely monitor.

## 2017-01-01 NOTE — Progress Notes (Signed)
[place] at [facility]                                                                                                                                                                                  Patient Demographics   Donna Fisher, is a 51 y.o. female, DOB - 11-07-1965, WER:[id number]  Admit date - 12/31/2016   Admitting Physician Hillary Bow, MD  Outpatient Primary MD for the patient is Patient, No Pcp Per   LOS - 1  Subjective: Patient admitted with left foot diabetic ulcer status post incision and drainage of the left foot Patient's blood pressure has been below.   Review of Systems:   CONSTITUTIONAL: No documented fever. No fatigue, weakness. No weight gain, no weight loss.  EYES: No blurry or double vision.  ENT: No tinnitus. No postnasal drip. No redness of the oropharynx.  RESPIRATORY: No cough, no wheeze, no hemoptysis. No dyspnea.  CARDIOVASCULAR: No chest pain. No orthopnea. No palpitations. No syncope.  GASTROINTESTINAL: No nausea, no vomiting or diarrhea. No abdominal pain. No melena or hematochezia.  GENITOURINARY: No dysuria or hematuria.  ENDOCRINE: No polyuria or nocturia. No heat or cold intolerance.  HEMATOLOGY: No anemia. No bruising. No bleeding.  INTEGUMENTARY: No rashes. No lesions.  MUSCULOSKELETAL: No arthritis. No swelling. No gout.  NEUROLOGIC: No numbness, tingling, or ataxia. No seizure-type activity.  PSYCHIATRIC: No anxiety. No insomnia. No ADD.    Vitals:   Vitals:   12/31/16 1831 12/31/16 2325 01/01/17 0500 01/01/17 0735  BP: 97/64 (!) 93/55  (!) 97/50  Pulse: 89 77  70  Resp: 18   18  Temp: 98.5 F (36.9 C) 97.5 F (36.4 C)    TempSrc: Oral Oral  Oral  SpO2: 95% 98%  100%  Weight:   169 lb 8 oz (76.9 kg)   Height:        Wt Readings from Last 3 Encounters:  01/01/17 169 lb 8 oz (76.9 kg)  09/09/16 158 lb (71.7 kg)  12/25/15 160 lb (72.6 kg)     Intake/Output Summary (Last 24 hours) at 01/01/17  1349 Last data filed at 01/01/17 1009  Gross per 24 hour  Intake          2483.75 ml  Output              600 ml  Net          1883.75 ml    Physical Exam:   GENERAL: Pleasant-appearing in no apparent distress.  HEAD, EYES, EARS, NOSE AND THROAT: Atraumatic, normocephalic. Extraocular muscles are intact. Pupils equal and reactive to light. Sclerae anicteric. No conjunctival injection. No oro-pharyngeal erythema.  NECK: Supple. There is no jugular venous distention. No bruits, no lymphadenopathy, no thyromegaly.  HEART: Regular rate and rhythm,. No murmurs, no rubs, no clicks.  LUNGS: Clear to auscultation bilaterally. No rales or rhonchi. No wheezes.  ABDOMEN: Soft, flat, nontender, nondistended. Has good bowel sounds. No hepatosplenomegaly appreciated.  EXTREMITIES: Left foot dressing in place NEUROLOGIC: The patient is alert, awake, and oriented x3 with no focal motor or sensory deficits appreciated bilaterally.  SKIN: Moist and warm with no rashes appreciated.  Psych: Not anxious, depressed LN: No inguinal LN enlargement    Antibiotics   Anti-infectives    Start     Dose/Rate Route Frequency Ordered Stop   12/31/16 1800  meropenem (MERREM) 1 g in sodium chloride 0.9 % 100 mL IVPB     1 g 200 mL/hr over 30 Minutes Intravenous Every 12 hours 12/31/16 1204     12/31/16 1630  vancomycin (VANCOCIN) 1,250 mg in sodium chloride 0.9 % 250 mL IVPB     1,250 mg 166.7 mL/hr over 90 Minutes Intravenous Every 24 hours 12/31/16 1213     12/31/16 1600  vancomycin (VANCOCIN) 1,250 mg in sodium chloride 0.9 % 250 mL IVPB  Status:  Discontinued     1,250 mg 166.7 mL/hr over 90 Minutes Intravenous Every 24 hours 12/31/16 1204 12/31/16 1213   12/31/16 1045  aztreonam (AZACTAM) 2 g in dextrose 5 % 50 mL IVPB     2 g 100 mL/hr over 30 Minutes Intravenous  Once 12/31/16 1022 12/31/16 1113   12/31/16 1030  metroNIDAZOLE (FLAGYL) IVPB 500 mg     500 mg 100 mL/hr over 60 Minutes Intravenous  Once  12/31/16 1022 12/31/16 1200   12/31/16 1030  vancomycin (VANCOCIN) IVPB 1000 mg/200 mL premix     1,000 mg 200 mL/hr over 60 Minutes Intravenous  Once 12/31/16 1022 12/31/16 1217      Medications   Scheduled Meds: . docusate sodium  100 mg Oral BID  . insulin aspart  0-15 Units Subcutaneous TID WC  . insulin aspart  0-5 Units Subcutaneous QHS  . insulin detemir  25 Units Subcutaneous QHS   Continuous Infusions: . sodium chloride 100 mL/hr at 01/01/17 1204  . meropenem (MERREM) IV 1 g (01/01/17 0521)  . vancomycin Stopped (01/01/17 0224)   PRN Meds:.acetaminophen **OR** acetaminophen, albuterol, HYDROcodone-acetaminophen, ondansetron **OR** ondansetron (ZOFRAN) IV, oxyCODONE **OR** oxyCODONE, polyethylene glycol, promethazine   Data Review:   Micro Results Recent Results (from the past 240 hour(s))  Urine culture     Status: Abnormal   Collection Time: 12/31/16 10:22 AM  Result Value Ref Range Status   Specimen Description URINE, CLEAN CATCH  Final   Special Requests NONE  Final   Culture (A)  Final    <10,000 COLONIES/mL INSIGNIFICANT GROWTH Performed at [facility], [address] [place], [place] [address]    Report Status 01/01/2017 FINAL  Final  Surgical pcr screen     Status: None   Collection Time: 12/31/16  2:40 PM  Result Value Ref Range Status   MRSA, PCR NEGATIVE NEGATIVE Final   Staphylococcus aureus NEGATIVE NEGATIVE Final    Comment:        The Xpert SA Assay (FDA approved for NASAL specimens in patients over 67 years of age), is one component of a comprehensive surveillance program.  Test performance has been validated by [facility] for patients greater than or equal to 21 year old. It is not intended to diagnose infection nor  to guide or monitor treatment.   Aerobic/Anaerobic Culture (surgical/deep wound)     Status: None (Preliminary result)   Collection Time: 12/31/16  4:30 PM  Result Value Ref Range Status   Specimen Description  WOUND  Final   Special Requests NONE  Final   Gram Stain   Final    ABUNDANT WBC PRESENT, PREDOMINANTLY PMN RARE SQUAMOUS EPITHELIAL CELLS PRESENT ABUNDANT GRAM POSITIVE COCCI IN PAIRS MODERATE GRAM NEGATIVE RODS FEW GRAM POSITIVE RODS Performed at [facility], [place] [address] [place], [place] [address]    Culture PENDING  Incomplete   Report Status PENDING  Incomplete    Radiology Reports Dg Foot Complete Left  Result Date: 12/31/2016 CLINICAL DATA:  Diabetic ulcer on the bottom of the foot over the medial aspect of the great toe. EXAM: LEFT FOOT - COMPLETE 3+ VIEW COMPARISON:  Left foot series of December 24, 2016 FINDINGS: The bones are subjectively mildly osteopenic. No lytic or blastic bony lesions are observed. Subtle osteoarthritic changes are noted of the first MTP joint and of the fifth MTP joint. There are plantar and Achilles region calcaneal spurs. There is small amount of soft tissue gas in the interspace between the first and second toes. There is gas over the base of the proximal phalanx of the great toe. There is mild soft tissue swelling over the dorsum of the foot with arterial calcification noted. IMPRESSION: Findings compatible with a known foot ulcer over the base of the great toe. No bony changes of osteomyelitis are observed. There are mild osteoarthritic changes elsewhere and there are plantar and Achilles region calcaneal spurs. Electronically Signed   By: David  Martinique M.D.   On: 12/31/2016 10:41   Dg Foot Complete Left  Result Date: 12/24/2016 CLINICAL DATA:  Diabetic ulcer with pain EXAM: LEFT FOOT - COMPLETE 3+ VIEW COMPARISON:  September 09, 2016 FINDINGS: Frontal, oblique, and lateral views were obtained. There is no evident fracture or dislocation. There is osteoarthritic change in the first MTP joint. There is also mild spurring in the dorsal midfoot. No erosive change or bony destruction. No soft tissue air or radiopaque foreign body evident. There are posterior  and inferior calcaneal spurs. IMPRESSION: Areas of osteoarthritic change. Calcaneal spurs. No fracture or dislocation. No erosive change or bony destruction. No radiopaque foreign body. No soft tissue air to suggest abscess seen by radiography. Electronically Signed   By: Lowella Grip III M.D.   On: 12/24/2016 10:02     CBC  Recent Labs Lab 12/31/16 1020 01/01/17 0441  WBC 28.9* 26.9*  HGB 12.4 10.5*  HCT 36.0 31.0*  PLT 342 279  MCV 88.0 87.8  MCH 30.3 29.6  MCHC 34.4 33.8  RDW 12.5 12.4  LYMPHSABS 0.9*  --   MONOABS 1.7*  --   EOSABS 0.0  --   BASOSABS 0.1  --     Chemistries   Recent Labs Lab 12/31/16 1020 01/01/17 0441  NA 133* 134*  K 3.8 4.6  CL 95* 102  CO2 26 25  GLUCOSE 344* 361*  BUN 31* 26*  CREATININE 1.37* 0.81  CALCIUM 9.1 8.0*  AST 26  --   ALT 20  --   ALKPHOS 126  --   BILITOT 1.1  --    ------------------------------------------------------------------------------------------------------------------ estimated creatinine clearance is 85.3 mL/min (by C-G formula based on SCr of 0.81 mg/dL). ------------------------------------------------------------------------------------------------------------------  Recent Labs  12/31/16 1020  HGBA1C 11.1*   ------------------------------------------------------------------------------------------------------------------ No results for input(s): CHOL, HDL,  LDLCALC, TRIG, CHOLHDL, LDLDIRECT in the last 72 hours. ------------------------------------------------------------------------------------------------------------------ No results for input(s): TSH, T4TOTAL, T3FREE, THYROIDAB in the last 72 hours.  Invalid input(s): FREET3 ------------------------------------------------------------------------------------------------------------------ No results for input(s): VITAMINB12, FOLATE, FERRITIN, TIBC, IRON, RETICCTPCT in the last 72 hours.  Coagulation profile No results for input(s): INR, PROTIME  in the last 168 hours.  No results for input(s): DDIMER in the last 72 hours.  Cardiac Enzymes No results for input(s): CKMB, TROPONINI, MYOGLOBIN in the last 168 hours.  Invalid input(s): CK ------------------------------------------------------------------------------------------------------------------ Invalid input(s): [place]  Patient with left foot infection  *Left foot diabetic ulcer with cellulitis. Possible abscess. Sepsis present on admission. Status post incision and drainage await cultures Continue vancomycin and meropenem Wound care per podiatry  * Insulin dependent diabetes mellitus.   *Hypotension likely related to infection I will increase her IV fluids  Patient reports she was on Levemir before I will change her Lantus to Levemir. At a higher dose blood sugars are under poor control * Acute kidney injury due to sepsis. Resolved with IV fluid  * DVT prophylaxis. Start patient on Lovenox for DVT prophylaxis    Code Status Orders        Start     Ordered   12/31/16 1131  Full code  Continuous     12/31/16 1132    Code Status History    Date Active Date Inactive Code Status Order ID Comments User Context   This patient has a current code status but no historical code status.           Consults podiatruy   DVT Prophylaxis  Lovenox -   Lab Results  Component Value Date   PLT 279 01/01/2017      Time Spent in minutes   63min  Greater than 50% of time spent in care coordination and counseling patient regarding the condition and plan of care.   Dustin Flock M.D on 01/01/2017 at 1:49 PM  Between 7am to 6pm - Pager - [phone]  After 6pm go to [email or web address] - password [email or web address] [email or web address]   Office  [phone]

## 2017-01-02 LAB — GLUCOSE, CAPILLARY
GLUCOSE-CAPILLARY: 168 mg/dL — AB (ref 65–99)
GLUCOSE-CAPILLARY: 199 mg/dL — AB (ref 65–99)
GLUCOSE-CAPILLARY: 160 mg/dL — AB (ref 65–99)
Glucose-Capillary: 280 mg/dL — ABNORMAL HIGH (ref 65–99)

## 2017-01-02 LAB — BASIC METABOLIC PANEL
Anion gap: 3 — ABNORMAL LOW (ref 5–15)
BUN: 24 mg/dL — AB (ref 6–20)
CO2: 26 mmol/L (ref 22–32)
CREATININE: 0.96 mg/dL (ref 0.44–1.00)
Calcium: 8 mg/dL — ABNORMAL LOW (ref 8.9–10.3)
Chloride: 109 mmol/L (ref 101–111)
GFR calc Af Amer: >60 mL/min (ref >60)
GLUCOSE: 287 mg/dL — AB (ref 65–99)
Potassium: 3.5 mmol/L (ref 3.5–5.1)
SODIUM: 138 mmol/L (ref 135–145)

## 2017-01-02 LAB — HEMOGLOBIN A1C
HEMOGLOBIN A1C: 10.8 % — AB (ref 4.8–5.6)
Mean Plasma Glucose: 263 mg/dL

## 2017-01-02 LAB — CBC
HCT: 30.8 % — ABNORMAL LOW (ref 35.0–47.0)
Hemoglobin: 10.5 g/dL — ABNORMAL LOW (ref 12.0–16.0)
MCH: 29.9 pg (ref 26.0–34.0)
MCHC: 34 g/dL (ref 32.0–36.0)
MCV: 87.8 fL (ref 80.0–100.0)
PLATELETS: 309 10*3/uL (ref 150–440)
RBC: 3.51 MIL/uL — ABNORMAL LOW (ref 3.80–5.20)
RDW: 12.5 % (ref 11.5–14.5)
WBC: 24.4 10*3/uL — ABNORMAL HIGH (ref 3.6–11.0)

## 2017-01-02 LAB — HIV ANTIBODY (ROUTINE TESTING W REFLEX): HIV Screen 4th Generation wRfx: NONREACTIVE

## 2017-01-02 MED ORDER — INSULIN ASPART 100 UNIT/ML ~~LOC~~ SOLN
4.0000 [IU] | Freq: Three times a day (TID) | SUBCUTANEOUS | Status: DC
  Administered 2017-01-02 – 2017-01-07 (×12): 4 [IU] via SUBCUTANEOUS
  Filled 2017-01-02 (×10): qty 1

## 2017-01-02 NOTE — Progress Notes (Signed)
[place] [facility]                                                                                                                                                                                  Patient Demographics   [place], is a 51 y.o. female, DOB - [birth year], CWC:[id number]  Admit date - 12/31/2016   Admitting Physician Hillary Bow, MD  Outpatient Primary MD for the patient is Patient, No Pcp Per   LOS - 2  Subjective: Feeling some better. Blood pressure much improved has some pain in the foot temperature also improved  Review of Systems:   CONSTITUTIONAL: Hypothermia yest. No fatigue, weakness. No weight gain, no weight loss.  EYES: No blurry or double vision.  ENT: No tinnitus. No postnasal drip. No redness of the oropharynx.  RESPIRATORY: No cough, no wheeze, no hemoptysis. No dyspnea.  CARDIOVASCULAR: No chest pain. No orthopnea. No palpitations. No syncope.  GASTROINTESTINAL: No nausea, no vomiting or diarrhea. No abdominal pain. No melena or hematochezia.  GENITOURINARY: No dysuria or hematuria.  ENDOCRINE: No polyuria or nocturia. No heat or cold intolerance.  HEMATOLOGY: No anemia. No bruising. No bleeding.  INTEGUMENTARY: No rashes. No lesions.  MUSCULOSKELETAL: No arthritis. No swelling. No gout.  NEUROLOGIC: No numbness, tingling, or ataxia. No seizure-type activity.  PSYCHIATRIC: No anxiety. No insomnia. No ADD.    Vitals:   Vitals:   01/01/17 1654 01/01/17 1848 01/01/17 2315 01/02/17 0727  BP:  (!) 92/57 (!) 93/52 104/62  Pulse:  87 83 87  Resp:   18 18  Temp: (!) 94.6 F (34.8 C) 98.4 F (36.9 C) 97.9 F (36.6 C) 98.4 F (36.9 C)  TempSrc: Oral Oral Oral Oral  SpO2:  97% 96% 95%  Weight:      Height:        Wt Readings from Last 3 Encounters:  01/01/17 169 lb 8 oz (76.9 kg)  09/09/16 158 lb (71.7 kg)  12/25/15 160 lb (72.6 kg)     Intake/Output Summary (Last 24 hours) at 01/02/17 1339 Last data filed  at 01/02/17 0800  Gross per 24 hour  Intake             4800 ml  Output              500 ml  Net             4300 ml    Physical Exam:   GENERAL: Pleasant-appearing in no apparent distress.  HEAD, EYES, EARS, NOSE AND THROAT: Atraumatic, normocephalic. Extraocular muscles are intact. Pupils equal and reactive to light. Sclerae anicteric. No conjunctival injection. No oro-pharyngeal erythema.  NECK: Supple. There is no jugular venous distention. No bruits, no lymphadenopathy, no thyromegaly.  HEART: Regular rate and rhythm,. No murmurs, no rubs, no clicks.  LUNGS: Clear to auscultation bilaterally. No rales or rhonchi. No wheezes.  ABDOMEN: Soft, flat, nontender, nondistended. Has good bowel sounds. No hepatosplenomegaly appreciated.  EXTREMITIES: Left foot dressing in place NEUROLOGIC: The patient is alert, awake, and oriented x3 with no focal motor or sensory deficits appreciated bilaterally.  SKIN: Moist and warm with no rashes appreciated.  Psych: Not anxious, depressed LN: No inguinal LN enlargement    Antibiotics   Anti-infectives    Start     Dose/Rate Route Frequency Ordered Stop   12/31/16 1800  meropenem (MERREM) 1 g in sodium chloride 0.9 % 100 mL IVPB     1 g 200 mL/hr over 30 Minutes Intravenous Every 12 hours 12/31/16 1204     12/31/16 1630  vancomycin (VANCOCIN) 1,250 mg in sodium chloride 0.9 % 250 mL IVPB     1,250 mg 166.7 mL/hr over 90 Minutes Intravenous Every 24 hours 12/31/16 1213     12/31/16 1600  vancomycin (VANCOCIN) 1,250 mg in sodium chloride 0.9 % 250 mL IVPB  Status:  Discontinued     1,250 mg 166.7 mL/hr over 90 Minutes Intravenous Every 24 hours 12/31/16 1204 12/31/16 1213   12/31/16 1045  aztreonam (AZACTAM) 2 g in dextrose 5 % 50 mL IVPB     2 g 100 mL/hr over 30 Minutes Intravenous  Once 12/31/16 1022 12/31/16 1113   12/31/16 1030  metroNIDAZOLE (FLAGYL) IVPB 500 mg     500 mg 100 mL/hr over 60 Minutes Intravenous  Once 12/31/16 1022 12/31/16  1200   12/31/16 1030  vancomycin (VANCOCIN) IVPB 1000 mg/200 mL premix     1,000 mg 200 mL/hr over 60 Minutes Intravenous  Once 12/31/16 1022 12/31/16 1217      Medications   Scheduled Meds: . docusate sodium  100 mg Oral BID  . enoxaparin (LOVENOX) injection  40 mg Subcutaneous Q24H  . insulin aspart  0-15 Units Subcutaneous TID WC  . insulin aspart  0-5 Units Subcutaneous QHS  . insulin detemir  25 Units Subcutaneous QHS  . midodrine  5 mg Oral TID WC   Continuous Infusions: . sodium chloride 100 mL/hr at 01/02/17 0533  . meropenem (MERREM) IV Stopped (01/02/17 0603)  . vancomycin 1,250 mg (01/01/17 1629)   PRN Meds:.acetaminophen **OR** acetaminophen, albuterol, HYDROcodone-acetaminophen, ondansetron **OR** ondansetron (ZOFRAN) IV, oxyCODONE **OR** oxyCODONE, polyethylene glycol, promethazine   Data Review:   Micro Results Recent Results (from the past 240 hour(s))  Blood culture (routine x 2)     Status: None (Preliminary result)   Collection Time: 12/31/16 10:20 AM  Result Value Ref Range Status   Specimen Description BLOOD LEFT ANTECUBITAL  Final   Special Requests   Final    BOTTLES DRAWN AEROBIC AND ANAEROBIC Blood Culture adequate volume   Culture NO GROWTH 2 DAYS  Final   Report Status PENDING  Incomplete  Urine culture     Status: Abnormal   Collection Time: 12/31/16 10:22 AM  Result Value Ref Range Status   Specimen Description URINE, CLEAN CATCH  Final   Special Requests NONE  Final   Culture (A)  Final    <10,000 COLONIES/mL INSIGNIFICANT GROWTH Performed at [facility], [address] [place], [place] [address]    Report Status 01/01/2017 FINAL  Final  Blood culture (routine x 2)  Status: None (Preliminary result)   Collection Time: 12/31/16 10:23 AM  Result Value Ref Range Status   Specimen Description BLOOD LEFT FEMORAL ARTERY  Final   Special Requests   Final    BOTTLES DRAWN AEROBIC AND ANAEROBIC Blood Culture adequate volume    Culture NO GROWTH 2 DAYS  Final   Report Status PENDING  Incomplete  Surgical pcr screen     Status: None   Collection Time: 12/31/16  2:40 PM  Result Value Ref Range Status   MRSA, PCR NEGATIVE NEGATIVE Final   Staphylococcus aureus NEGATIVE NEGATIVE Final    Comment:        The Xpert SA Assay (FDA approved for NASAL specimens in patients over 27 years of age), is one component of a comprehensive surveillance program.  Test performance has been validated by [facility] for patients greater than or equal to 79 year old. It is not intended to diagnose infection nor to guide or monitor treatment.   Aerobic/Anaerobic Culture (surgical/deep wound)     Status: None (Preliminary result)   Collection Time: 12/31/16  4:30 PM  Result Value Ref Range Status   Specimen Description WOUND  Final   Special Requests NONE  Final   Gram Stain   Final    ABUNDANT WBC PRESENT, PREDOMINANTLY PMN RARE SQUAMOUS EPITHELIAL CELLS PRESENT ABUNDANT GRAM POSITIVE COCCI IN PAIRS MODERATE GRAM NEGATIVE RODS FEW GRAM POSITIVE RODS Performed at [facility], [place] [address] [place], [place] [address]    Culture PENDING  Incomplete   Report Status PENDING  Incomplete    Radiology Reports Dg Foot Complete Left  Result Date: 12/31/2016 CLINICAL DATA:  Diabetic ulcer on the bottom of the foot over the medial aspect of the great toe. EXAM: LEFT FOOT - COMPLETE 3+ VIEW COMPARISON:  Left foot series of December 24, 2016 FINDINGS: The bones are subjectively mildly osteopenic. No lytic or blastic bony lesions are observed. Subtle osteoarthritic changes are noted of the first MTP joint and of the fifth MTP joint. There are plantar and Achilles region calcaneal spurs. There is small amount of soft tissue gas in the interspace between the first and second toes. There is gas over the base of the proximal phalanx of the great toe. There is mild soft tissue swelling over the dorsum of the foot with arterial calcification  noted. IMPRESSION: Findings compatible with a known foot ulcer over the base of the great toe. No bony changes of osteomyelitis are observed. There are mild osteoarthritic changes elsewhere and there are plantar and Achilles region calcaneal spurs. Electronically Signed   By: David  Martinique M.D.   On: 12/31/2016 10:41   Dg Foot Complete Left  Result Date: 12/24/2016 CLINICAL DATA:  Diabetic ulcer with pain EXAM: LEFT FOOT - COMPLETE 3+ VIEW COMPARISON:  September 09, 2016 FINDINGS: Frontal, oblique, and lateral views were obtained. There is no evident fracture or dislocation. There is osteoarthritic change in the first MTP joint. There is also mild spurring in the dorsal midfoot. No erosive change or bony destruction. No soft tissue air or radiopaque foreign body evident. There are posterior and inferior calcaneal spurs. IMPRESSION: Areas of osteoarthritic change. Calcaneal spurs. No fracture or dislocation. No erosive change or bony destruction. No radiopaque foreign body. No soft tissue air to suggest abscess seen by radiography. Electronically Signed   By: Lowella Grip III M.D.   On: 12/24/2016 10:02     CBC  Recent Labs Lab 12/31/16 1020 01/01/17 0441  01/02/17 0401  WBC 28.9* 26.9* 24.4*  HGB 12.4 10.5* 10.5*  HCT 36.0 31.0* 30.8*  PLT 342 279 309  MCV 88.0 87.8 87.8  MCH 30.3 29.6 29.9  MCHC 34.4 33.8 34.0  RDW 12.5 12.4 12.5  LYMPHSABS 0.9*  --   --   MONOABS 1.7*  --   --   EOSABS 0.0  --   --   BASOSABS 0.1  --   --     Chemistries   Recent Labs Lab 12/31/16 1020 01/01/17 0441 01/02/17 0401  NA 133* 134* 138  K 3.8 4.6 3.5  CL 95* 102 109  CO2 26 25 26   GLUCOSE 344* 361* 287*  BUN 31* 26* 24*  CREATININE 1.37* 0.81 0.96  CALCIUM 9.1 8.0* 8.0*  AST 26  --   --   ALT 20  --   --   ALKPHOS 126  --   --   BILITOT 1.1  --   --    ------------------------------------------------------------------------------------------------------------------ estimated creatinine  clearance is 71.9 mL/min (by C-G formula based on SCr of 0.96 mg/dL). ------------------------------------------------------------------------------------------------------------------  Recent Labs  12/31/16 1020 01/01/17 0441  HGBA1C 11.1* 10.8*   ------------------------------------------------------------------------------------------------------------------ No results for input(s): CHOL, HDL, LDLCALC, TRIG, CHOLHDL, LDLDIRECT in the last 72 hours. ------------------------------------------------------------------------------------------------------------------ No results for input(s): TSH, T4TOTAL, T3FREE, THYROIDAB in the last 72 hours.  Invalid input(s): FREET3 ------------------------------------------------------------------------------------------------------------------ No results for input(s): VITAMINB12, FOLATE, FERRITIN, TIBC, IRON, RETICCTPCT in the last 72 hours.  Coagulation profile No results for input(s): INR, PROTIME in the last 168 hours.  No results for input(s): DDIMER in the last 72 hours.  Cardiac Enzymes No results for input(s): CKMB, TROPONINI, MYOGLOBIN in the last 168 hours.  Invalid input(s): CK ------------------------------------------------------------------------------------------------------------------ Invalid input(s): [place]  Patient with left foot infection  *Sepsis with hypotension blood pressure now improved continue IV fluids Continue Midrin daily  *Left foot diabetic ulcer with cellulitis. Possible abscess. Sepsis present on admission. Status post incision and drainage await cultures Continue vancomycin and meropenem Wound VAC per podiatry  * Insulin dependent diabetes mellitus.  Continue insulin therapy Continue levemir  *Hypotension due to sepsis now improved   * Acute kidney injury due to sepsis. Resolved with IV fluid  * DVT prophylaxis. Start patient on Lovenox for DVT prophylaxis    Code  Status Orders        Start     Ordered   12/31/16 1131  Full code  Continuous     12/31/16 1132    Code Status History    Date Active Date Inactive Code Status Order ID Comments User Context   This patient has a current code status but no historical code status.           Consults podiatruy   DVT Prophylaxis  Lovenox -   Lab Results  Component Value Date   PLT 309 01/02/2017      Time Spent in minutes   3min  Greater than 50% of time spent in care coordination and counseling patient regarding the condition and plan of care.   Dustin Flock M.D on 01/02/2017 at 1:39 PM  Between 7am to 6pm - Pager - [phone]  After 6pm go to [email or web address] - password [email or web address] [facility]   Office  [phone]
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
## 2017-01-03 LAB — GLUCOSE, CAPILLARY
GLUCOSE-CAPILLARY: 171 mg/dL — AB (ref 65–99)
GLUCOSE-CAPILLARY: 180 mg/dL — AB (ref 65–99)
GLUCOSE-CAPILLARY: 121 mg/dL — AB (ref 65–99)
GLUCOSE-CAPILLARY: 163 mg/dL — AB (ref 65–99)
Glucose-Capillary: 92 mg/dL (ref 65–99)

## 2017-01-03 LAB — CBC
HEMATOCRIT: 31.4 % — AB (ref 35.0–47.0)
HEMOGLOBIN: 10.4 g/dL — AB (ref 12.0–16.0)
MCH: 29.1 pg (ref 26.0–34.0)
MCHC: 33 g/dL (ref 32.0–36.0)
MCV: 87.9 fL (ref 80.0–100.0)
Platelets: 339 10*3/uL (ref 150–440)
RBC: 3.57 MIL/uL — AB (ref 3.80–5.20)
RDW: 12.5 % (ref 11.5–14.5)
WBC: 15.5 10*3/uL — AB (ref 3.6–11.0)

## 2017-01-03 LAB — BASIC METABOLIC PANEL
ANION GAP: 3 — AB (ref 5–15)
BUN: 17 mg/dL (ref 6–20)
CO2: 25 mmol/L (ref 22–32)
Calcium: 7.7 mg/dL — ABNORMAL LOW (ref 8.9–10.3)
Chloride: 107 mmol/L (ref 101–111)
Creatinine, Ser: 0.66 mg/dL (ref 0.44–1.00)
GFR calc Af Amer: >60 mL/min (ref >60)
GFR calc non Af Amer: >60 mL/min (ref >60)
GLUCOSE: 99 mg/dL (ref 65–99)
POTASSIUM: 3.4 mmol/L — AB (ref 3.5–5.1)
Sodium: 135 mmol/L (ref 135–145)

## 2017-01-03 LAB — PROCALCITONIN: Procalcitonin: 0.35 ng/mL

## 2017-01-03 MED ORDER — DEXTROSE 5 % IV SOLN
2.0000 g | INTRAVENOUS | Status: DC
  Administered 2017-01-03 – 2017-01-05 (×3): 2 g via INTRAVENOUS
  Filled 2017-01-03 (×5): qty 2

## 2017-01-03 MED ORDER — SODIUM CHLORIDE 0.9 % IV SOLN
1.0000 g | Freq: Three times a day (TID) | INTRAVENOUS | Status: DC
  Administered 2017-01-03: 1 g via INTRAVENOUS
  Filled 2017-01-03 (×2): qty 1

## 2017-01-03 MED ORDER — SODIUM CHLORIDE 0.9% FLUSH
10.0000 mL | INTRAVENOUS | Status: DC | PRN
  Administered 2017-01-07: 10 mL
  Filled 2017-01-03: qty 40

## 2017-01-03 MED ORDER — POTASSIUM CHLORIDE 20 MEQ PO PACK
20.0000 meq | PACK | Freq: Once | ORAL | Status: AC
  Administered 2017-01-03: 20 meq via ORAL
  Filled 2017-01-03: qty 1

## 2017-01-03 MED ORDER — METRONIDAZOLE 500 MG PO TABS
500.0000 mg | ORAL_TABLET | Freq: Three times a day (TID) | ORAL | Status: DC
  Administered 2017-01-03 – 2017-01-13 (×30): 500 mg via ORAL
  Filled 2017-01-03 (×30): qty 1

## 2017-01-03 MED ORDER — SODIUM CHLORIDE 0.9% FLUSH
10.0000 mL | Freq: Two times a day (BID) | INTRAVENOUS | Status: DC
  Administered 2017-01-03 – 2017-01-12 (×17): 10 mL
  Administered 2017-01-13: 20 mL

## 2017-01-03 NOTE — Progress Notes (Signed)
[place] at [facility]                                                                                                                                                                                  Patient Demographics   [place], is a 51 y.o. female, DOB - [birth year], WUJ:[id number]  Admit date - 12/31/2016   Admitting Physician Hillary Bow, MD  Outpatient Primary MD for the patient is Patient, No Pcp Per   LOS - 3  Subjective: Patient states that feeling little better has some pain in the leg  Review of Systems:   CONSTITUTIONAL: Hypothermia yest. No fatigue, weakness. No weight gain, no weight loss.  EYES: No blurry or double vision.  ENT: No tinnitus. No postnasal drip. No redness of the oropharynx.  RESPIRATORY: No cough, no wheeze, no hemoptysis. No dyspnea.  CARDIOVASCULAR: No chest pain. No orthopnea. No palpitations. No syncope.  GASTROINTESTINAL: No nausea, no vomiting or diarrhea. No abdominal pain. No melena or hematochezia.  GENITOURINARY: No dysuria or hematuria.  ENDOCRINE: No polyuria or nocturia. No heat or cold intolerance.  HEMATOLOGY: No anemia. No bruising. No bleeding.  INTEGUMENTARY: No rashes. No lesions.  MUSCULOSKELETAL: No arthritis. No swelling. No gout.  NEUROLOGIC: No numbness, tingling, or ataxia. No seizure-type activity.  PSYCHIATRIC: No anxiety. No insomnia. No ADD.    Vitals:   Vitals:   01/02/17 0727 01/02/17 1348 01/02/17 2356 01/03/17 0810  BP: 104/62 (!) 110/52 118/61 (!) 104/58  Pulse: 87 83 78 93  Resp: 18 18 18    Temp: 98.4 F (36.9 C) 97 F (36.1 C) 98 F (36.7 C) 99.5 F (37.5 C)  TempSrc: Oral Axillary Oral Oral  SpO2: 95% 100% 99% 98%  Weight:      Height:        Wt Readings from Last 3 Encounters:  01/01/17 169 lb 8 oz (76.9 kg)  09/09/16 158 lb (71.7 kg)  12/25/15 160 lb (72.6 kg)     Intake/Output Summary (Last 24 hours) at 01/03/17 1325 Last data filed at 01/03/17 1020   Gross per 24 hour  Intake          3198.33 ml  Output              800 ml  Net          2398.33 ml    Physical Exam:   GENERAL: Pleasant-appearing in no apparent distress.  HEAD, EYES, EARS, NOSE AND THROAT: Atraumatic, normocephalic. Extraocular muscles are intact. Pupils equal and reactive to light. Sclerae anicteric. No conjunctival injection. No oro-pharyngeal erythema.  NECK: Supple. There is no jugular venous distention. No bruits, no  lymphadenopathy, no thyromegaly.  HEART: Regular rate and rhythm,. No murmurs, no rubs, no clicks.  LUNGS: Clear to auscultation bilaterally. No rales or rhonchi. No wheezes.  ABDOMEN: Soft, flat, nontender, nondistended. Has good bowel sounds. No hepatosplenomegaly appreciated.  EXTREMITIES: Left foot dressing in place NEUROLOGIC: The patient is alert, awake, and oriented x3 with no focal motor or sensory deficits appreciated bilaterally.  SKIN: Moist and warm with no rashes appreciated.  Psych: Not anxious, depressed LN: No inguinal LN enlargement    Antibiotics   Anti-infectives    Start     Dose/Rate Route Frequency Ordered Stop   01/03/17 1400  meropenem (MERREM) 1 g in sodium chloride 0.9 % 100 mL IVPB     1 g 200 mL/hr over 30 Minutes Intravenous Every 8 hours 01/03/17 1030     12/31/16 1800  meropenem (MERREM) 1 g in sodium chloride 0.9 % 100 mL IVPB  Status:  Discontinued     1 g 200 mL/hr over 30 Minutes Intravenous Every 12 hours 12/31/16 1204 01/03/17 1030   12/31/16 1630  vancomycin (VANCOCIN) 1,250 mg in sodium chloride 0.9 % 250 mL IVPB     1,250 mg 166.7 mL/hr over 90 Minutes Intravenous Every 24 hours 12/31/16 1213     12/31/16 1600  vancomycin (VANCOCIN) 1,250 mg in sodium chloride 0.9 % 250 mL IVPB  Status:  Discontinued     1,250 mg 166.7 mL/hr over 90 Minutes Intravenous Every 24 hours 12/31/16 1204 12/31/16 1213   12/31/16 1045  aztreonam (AZACTAM) 2 g in dextrose 5 % 50 mL IVPB     2 g 100 mL/hr over 30 Minutes  Intravenous  Once 12/31/16 1022 12/31/16 1113   12/31/16 1030  metroNIDAZOLE (FLAGYL) IVPB 500 mg     500 mg 100 mL/hr over 60 Minutes Intravenous  Once 12/31/16 1022 12/31/16 1200   12/31/16 1030  vancomycin (VANCOCIN) IVPB 1000 mg/200 mL premix     1,000 mg 200 mL/hr over 60 Minutes Intravenous  Once 12/31/16 1022 12/31/16 1217      Medications   Scheduled Meds: . docusate sodium  100 mg Oral BID  . enoxaparin (LOVENOX) injection  40 mg Subcutaneous Q24H  . insulin aspart  0-15 Units Subcutaneous TID WC  . insulin aspart  0-5 Units Subcutaneous QHS  . insulin aspart  4 Units Subcutaneous TID WC  . insulin detemir  25 Units Subcutaneous QHS  . midodrine  5 mg Oral TID WC   Continuous Infusions: . sodium chloride 50 mL/hr at 01/03/17 1036  . meropenem (MERREM) IV 1 g (01/03/17 1306)  . vancomycin Stopped (01/02/17 1826)   PRN Meds:.acetaminophen **OR** acetaminophen, albuterol, HYDROcodone-acetaminophen, ondansetron **OR** ondansetron (ZOFRAN) IV, oxyCODONE **OR** oxyCODONE, polyethylene glycol, promethazine   Data Review:   Micro Results Recent Results (from the past 240 hour(s))  Blood culture (routine x 2)     Status: None (Preliminary result)   Collection Time: 12/31/16 10:20 AM  Result Value Ref Range Status   Specimen Description BLOOD LEFT ANTECUBITAL  Final   Special Requests   Final    BOTTLES DRAWN AEROBIC AND ANAEROBIC Blood Culture adequate volume   Culture NO GROWTH 3 DAYS  Final   Report Status PENDING  Incomplete  Urine culture     Status: Abnormal   Collection Time: 12/31/16 10:22 AM  Result Value Ref Range Status   Specimen Description URINE, CLEAN CATCH  Final   Special Requests NONE  Final   Culture (A)  Final    <  10,000 COLONIES/mL INSIGNIFICANT GROWTH Performed at [facility], [place] [address] [place], [place] [address]    Report Status 01/01/2017 FINAL  Final  Blood culture (routine x 2)     Status: None (Preliminary result)   Collection  Time: 12/31/16 10:23 AM  Result Value Ref Range Status   Specimen Description BLOOD LEFT FEMORAL ARTERY  Final   Special Requests   Final    BOTTLES DRAWN AEROBIC AND ANAEROBIC Blood Culture adequate volume   Culture NO GROWTH 3 DAYS  Final   Report Status PENDING  Incomplete  Surgical pcr screen     Status: None   Collection Time: 12/31/16  2:40 PM  Result Value Ref Range Status   MRSA, PCR NEGATIVE NEGATIVE Final   Staphylococcus aureus NEGATIVE NEGATIVE Final    Comment:        The Xpert SA Assay (FDA approved for NASAL specimens in patients over 53 years of age), is one component of a comprehensive surveillance program.  Test performance has been validated by [facility] for patients greater than or equal to 31 year old. It is not intended to diagnose infection nor to guide or monitor treatment.   Aerobic/Anaerobic Culture (surgical/deep wound)     Status: None (Preliminary result)   Collection Time: 12/31/16  4:30 PM  Result Value Ref Range Status   Specimen Description WOUND  Final   Special Requests NONE  Final   Gram Stain   Final    ABUNDANT WBC PRESENT, PREDOMINANTLY PMN RARE SQUAMOUS EPITHELIAL CELLS PRESENT ABUNDANT GRAM POSITIVE COCCI IN PAIRS MODERATE GRAM NEGATIVE RODS FEW GRAM POSITIVE RODS Performed at [facility], [place] [address] [place], [place] [address]    Culture   Final    MODERATE STREPTOCOCCUS GROUP G NO ANAEROBES ISOLATED; CULTURE IN PROGRESS FOR 5 DAYS    Report Status PENDING  Incomplete  CULTURE, BLOOD (ROUTINE X 2) w Reflex to ID Panel     Status: None (Preliminary result)   Collection Time: 01/02/17  1:15 PM  Result Value Ref Range Status   Specimen Description BLOOD RIGHT ANTECUBITAL  Final   Special Requests   Final    BOTTLES DRAWN AEROBIC AND ANAEROBIC Blood Culture adequate volume   Culture NO GROWTH < 24 HOURS  Final   Report Status PENDING  Incomplete  CULTURE, BLOOD (ROUTINE X 2) w Reflex to ID Panel     Status: None  (Preliminary result)   Collection Time: 01/02/17  1:15 PM  Result Value Ref Range Status   Specimen Description BLOOD BLOOD RIGHT FOREARM  Final   Special Requests   Final    BOTTLES DRAWN AEROBIC AND ANAEROBIC Blood Culture adequate volume   Culture NO GROWTH < 24 HOURS  Final   Report Status PENDING  Incomplete    Radiology Reports Dg Foot Complete Left  Result Date: 12/31/2016 CLINICAL DATA:  Diabetic ulcer on the bottom of the foot over the medial aspect of the great toe. EXAM: LEFT FOOT - COMPLETE 3+ VIEW COMPARISON:  Left foot series of December 24, 2016 FINDINGS: The bones are subjectively mildly osteopenic. No lytic or blastic bony lesions are observed. Subtle osteoarthritic changes are noted of the first MTP joint and of the fifth MTP joint. There are plantar and Achilles region calcaneal spurs. There is small amount of soft tissue gas in the interspace between the first and second toes. There is gas over the base of the proximal phalanx of the great toe.  There is mild soft tissue swelling over the dorsum of the foot with arterial calcification noted. IMPRESSION: Findings compatible with a known foot ulcer over the base of the great toe. No bony changes of osteomyelitis are observed. There are mild osteoarthritic changes elsewhere and there are plantar and Achilles region calcaneal spurs. Electronically Signed   By: David  Martinique M.D.   On: 12/31/2016 10:41   Dg Foot Complete Left  Result Date: 12/24/2016 CLINICAL DATA:  Diabetic ulcer with pain EXAM: LEFT FOOT - COMPLETE 3+ VIEW COMPARISON:  September 09, 2016 FINDINGS: Frontal, oblique, and lateral views were obtained. There is no evident fracture or dislocation. There is osteoarthritic change in the first MTP joint. There is also mild spurring in the dorsal midfoot. No erosive change or bony destruction. No soft tissue air or radiopaque foreign body evident. There are posterior and inferior calcaneal spurs. IMPRESSION: Areas of osteoarthritic  change. Calcaneal spurs. No fracture or dislocation. No erosive change or bony destruction. No radiopaque foreign body. No soft tissue air to suggest abscess seen by radiography. Electronically Signed   By: Lowella Grip III M.D.   On: 12/24/2016 10:02     CBC  Recent Labs Lab 12/31/16 1020 01/01/17 0441 01/02/17 0401 01/03/17 0549  WBC 28.9* 26.9* 24.4* 15.5*  HGB 12.4 10.5* 10.5* 10.4*  HCT 36.0 31.0* 30.8* 31.4*  PLT 342 279 309 339  MCV 88.0 87.8 87.8 87.9  MCH 30.3 29.6 29.9 29.1  MCHC 34.4 33.8 34.0 33.0  RDW 12.5 12.4 12.5 12.5  LYMPHSABS 0.9*  --   --   --   MONOABS 1.7*  --   --   --   EOSABS 0.0  --   --   --   BASOSABS 0.1  --   --   --     Chemistries   Recent Labs Lab 12/31/16 1020 01/01/17 0441 01/02/17 0401 01/03/17 0549  NA 133* 134* 138 135  K 3.8 4.6 3.5 3.4*  CL 95* 102 109 107  CO2 26 25 26 25   GLUCOSE 344* 361* 287* 99  BUN 31* 26* 24* 17  CREATININE 1.37* 0.81 0.96 0.66  CALCIUM 9.1 8.0* 8.0* 7.7*  AST 26  --   --   --   ALT 20  --   --   --   ALKPHOS 126  --   --   --   BILITOT 1.1  --   --   --    ------------------------------------------------------------------------------------------------------------------ estimated creatinine clearance is 86.3 mL/min (by C-G formula based on SCr of 0.66 mg/dL). ------------------------------------------------------------------------------------------------------------------  Recent Labs  01/01/17 0441  HGBA1C 10.8*   ------------------------------------------------------------------------------------------------------------------ No results for input(s): CHOL, HDL, LDLCALC, TRIG, CHOLHDL, LDLDIRECT in the last 72 hours. ------------------------------------------------------------------------------------------------------------------ No results for input(s): TSH, T4TOTAL, T3FREE, THYROIDAB in the last 72 hours.  Invalid input(s):  FREET3 ------------------------------------------------------------------------------------------------------------------ No results for input(s): VITAMINB12, FOLATE, FERRITIN, TIBC, IRON, RETICCTPCT in the last 72 hours.  Coagulation profile No results for input(s): INR, PROTIME in the last 168 hours.  No results for input(s): DDIMER in the last 72 hours.  Cardiac Enzymes No results for input(s): CKMB, TROPONINI, MYOGLOBIN in the last 168 hours.  Invalid input(s): CK ------------------------------------------------------------------------------------------------------------------ Invalid input(s): [place]  Patient with left foot infection  *Sepsis with hypotension blood pressure now improved continue IV fluids Continue Midrin daily  *Left foot diabetic ulcer with cellulitis. Possible abscess. Sepsis present on admission. Status post incision and drainage await cultures Continue  vancomycin and meropenem ID consult Wound VAC per podiatry possible today  * Insulin dependent diabetes mellitus.  Continue insulin therapy Continue levemir blood sugars improved  *Hypotension due to sepsis now improved decrease IV fluids  * Acute kidney injury due to sepsis. Resolved with IV fluid  * DVT prophylaxis. Start patient on Lovenox for DVT prophylaxis    Code Status Orders        Start     Ordered   12/31/16 1131  Full code  Continuous     12/31/16 1132    Code Status History    Date Active Date Inactive Code Status Order ID Comments User Context   This patient has a current code status but no historical code status.           Consults podiatruy   DVT Prophylaxis  Lovenox -   Lab Results  Component Value Date   PLT 339 01/03/2017      Time Spent in minutes   75min  Greater than 50% of time spent in care coordination and counseling patient regarding the condition and plan of care.   Dustin Flock M.D on 01/03/2017 at 1:25 PM  Between  7am to 6pm - Pager - [phone]  After 6pm go to [email or web address] - password [email or web address] [facility]   Office  [phone]

## 2017-01-03 NOTE — Care Management (Signed)
[facility] notified of home IV antibiotics.

## 2017-01-03 NOTE — Care Management Note (Signed)
Case Management Note  Patient Details  Name: Donna Fisher MRN: [id number] Date of Birth: Aug 22, 1965  Subjective/Objective:  Met with uninsured patient at bedside. Application given for [facility]. Referral sent to both agencies. Patient had an appointment with [facility] today but had to cancel it due to hospitalization.   Application given for charity program with [facility] for wound vac. RNCM to follow up to get form to fax back to [facility].  Referral to [facility] with [facility] for home health nursing through [place] program. Ordered a rolling walker from [facility].                  Action/Plan: RNCM to follow up. It is anticipated patient will dishcarge in the next 24 hours.   Expected Discharge Date:  01/03/17               Expected Discharge Plan:  [place]  In-House Referral:     Discharge planning Services  CM Consult, [facility], Medication Assistance  Post Acute Care Choice:  Durable Medical Equipment, Home Health Choice offered to:  Patient  DME Arranged:  Walker rolling, Vac DME Agency:  [place]  HH Arranged:  RN [facility] Agency:  [place]  Status of Service:  In process, will continue to follow  If discussed at Long Length of Stay Meetings, dates discussed:    Additional Comments:  Jolly Mango, RN 01/03/2017, 12:22 PM

## 2017-01-03 NOTE — Progress Notes (Signed)
Peripherally Inserted Central Catheter/Midline Placement  The IV Nurse has discussed with the patient and/or persons authorized to consent for the patient, the purpose of this procedure and the potential benefits and risks involved with this procedure.  The benefits include less needle sticks, lab draws from the catheter, and the patient may be discharged home with the catheter. Risks include, but not limited to, infection, bleeding, blood clot (thrombus formation), and puncture of an artery; nerve damage and irregular heartbeat and possibility to perform a PICC exchange if needed/ordered by physician.  Alternatives to this procedure were also discussed.  Bard Power PICC patient education guide, fact sheet on infection prevention and patient information card has been provided to patient /or left at bedside.    PICC/Midline Placement Documentation        Donna Fisher, Donna Fisher 01/03/2017, 5:58 PM

## 2017-01-03 NOTE — Progress Notes (Signed)
Inpatient Diabetes Program Recommendations  AACE/ADA: New Consensus Statement on Inpatient Glycemic Control (2015)  Target Ranges:  Prepandial:   less than 140 mg/dL      [facility] postprandial:   less than 180 mg/dL (1-2 hours)      Critically ill patients:  140 - 180 mg/dL   Lab Results  Component Value Date   GLUCAP 180 (H) 01/03/2017   HGBA1C 10.8 (H) 01/01/2017    Review of Glycemic Control:  Results for ROXI, HLAVATY (MRN [id number]) as of 01/03/2017 15:40  Ref. Range 01/02/2017 16:30 01/02/2017 21:06 01/03/2017 07:47 01/03/2017 12:03  Glucose-Capillary Latest Ref Range: 65 - 99 mg/dL 199 (H) 160 (H) 92 180 (H)    Diabetes history: Type 2 diabetes Outpatient Diabetes medications: None Current orders for Inpatient glycemic control:  Novolog moderate tid with meals and HS, Levemir 25 units q HS, Novolog 4 units tid with meals  Inpatient Diabetes Program Recommendations:    Spoke with patient regarding diabetes management.  She states that she has been off her insulin for the past year and a half due to not having insurance.  We discussed importance of diabetes management and need for follow-up with PCP.  She states that she plans to follow-up at [facility] and get her medications from the [facility].  Patient states she is in the process of trying to get disability as well.  She has meter but no strips.  Discussed Reli-on meter from [employer] for 9$.  Patient appreciative of information.  She states she has taken classes on diabetes prior to losing her insurance and that she knows what to do. She is pleased with getting assistance from medication management clinic and f/u with MD.    Thanks, Adah Perl, RN, BC-ADM Inpatient Diabetes Coordinator Pager [phone] (8a-5p)

## 2017-01-03 NOTE — Progress Notes (Addendum)
Donna, Fisher ([id number]) Visit Report for 12/31/2016 Arrival Information Details Patient Name: Donna Fisher, Donna Fisher Date of Service: 12/31/2016 9:15 AM Medical Record Number: [id number] Patient Account Number: [id number] Date of Birth/Sex: 08-Mar-1966 (51 y.o. Female) Treating RN: Carolyne Fiscal, Debi Primary Care Dierre Crevier: PATIENT, NO Other Clinician: Referring Meko Masterson: Treating Sariyah Corcino/Extender: Frann Rider in Treatment: 1 Visit Information History Since Last Visit All ordered tests and consults were completed: No Patient Arrived: Ambulatory Added or deleted any medications: No Arrival Time: 09:17 Any new allergies or adverse reactions: No Accompanied By: self Had a fall or experienced change in No Transfer Assistance: None activities of daily living that may affect Patient Identification Verified: Yes risk of falls: Secondary Verification Process Yes Signs or symptoms of abuse/neglect since last No Completed: visito Patient Requires Transmission-Based No Hospitalized since last visit: No Precautions: Has Dressing in Place as Prescribed: Yes Patient Has Alerts: Yes Pain Present Now: Yes Patient Alerts: DMII Electronic Signature(s) Signed: 01/03/2017 3:47:26 PM By: Alric Quan Entered By: Alric Quan on 12/31/2016 09:18:04 Donna Fisher ([id number]) -------------------------------------------------------------------------------- Clinic Level of Care Assessment Details Patient Name: Donna Fisher Date of Service: 12/31/2016 9:15 AM Medical Record Number: [id number] Patient Account Number: [id number] Date of Birth/Sex: 03/29/1966 (51 y.o. Female) Treating RN: Carolyne Fiscal, Debi Primary Care Jamiel Goncalves: PATIENT, NO Other Clinician: Referring Joelie Schou: Treating Jakobie Henslee/Extender: Frann Rider in Treatment: 1 Clinic Level of Care Assessment Items TOOL 4 Quantity Score X - Use when only an EandM is performed on FOLLOW-UP visit 1 0 ASSESSMENTS -  Nursing Assessment / Reassessment X - Reassessment of Co-morbidities (includes updates in patient status) 1 10 X - Reassessment of Adherence to Treatment Plan 1 5 ASSESSMENTS - Wound and Skin Assessment / Reassessment X - Simple Wound Assessment / Reassessment - one wound 1 5 []  - Complex Wound Assessment / Reassessment - multiple wounds 0 []  - Dermatologic / Skin Assessment (not related to wound area) 0 ASSESSMENTS - Focused Assessment []  - Circumferential Edema Measurements - multi extremities 0 []  - Nutritional Assessment / Counseling / Intervention 0 []  - Lower Extremity Assessment (monofilament, tuning fork, pulses) 0 []  - Peripheral Arterial Disease Assessment (using hand held doppler) 0 ASSESSMENTS - Ostomy and/or Continence Assessment and Care []  - Incontinence Assessment and Management 0 []  - Ostomy Care Assessment and Management (repouching, etc.) 0 PROCESS - Coordination of Care []  - Simple Patient / Family Education for ongoing care 0 X - Complex (extensive) Patient / Family Education for ongoing care 1 20 X - Staff obtains Programmer, systems, Records, Test Results / Process Orders 1 10 X - Staff telephones HHA, Nursing Homes / Clarify orders / etc 1 10 X - Routine Transfer to another Facility (non-emergent condition) 1 10 Barbary, Marciana D. ([id number]) []  - Routine Hospital Admission (non-emergent condition) 0 []  - New Admissions / Biomedical engineer / Ordering NPWT, Apligraf, etc. 0 []  - Emergency Hospital Admission (emergent condition) 0 X - Simple Discharge Coordination 1 10 []  - Complex (extensive) Discharge Coordination 0 PROCESS - Special Needs []  - Pediatric / Minor Patient Management 0 []  - Isolation Patient Management 0 []  - Hearing / Language / Visual special needs 0 []  - Assessment of Community assistance (transportation, D/C planning, etc.) 0 []  - Additional assistance / Altered mentation 0 []  - Support Surface(s) Assessment (bed, cushion, seat, etc.)  0 INTERVENTIONS - Wound Cleansing / Measurement X - Simple Wound Cleansing - one wound 1 5 []  - Complex Wound Cleansing - multiple wounds 0 []  - Wound Imaging (  photographs - any number of wounds) 0 []  - Wound Tracing (instead of photographs) 0 X - Simple Wound Measurement - one wound 1 5 []  - Complex Wound Measurement - multiple wounds 0 INTERVENTIONS - Wound Dressings X - Small Wound Dressing one or multiple wounds 1 10 []  - Medium Wound Dressing one or multiple wounds 0 []  - Large Wound Dressing one or multiple wounds 0 X - Application of Medications - topical 1 5 []  - Application of Medications - injection 0 INTERVENTIONS - Miscellaneous []  - External ear exam 0 Babin, Catalena D. ([id number]) []  - Specimen Collection (cultures, biopsies, blood, body fluids, etc.) 0 []  - Specimen(s) / Culture(s) sent or taken to Lab for analysis 0 []  - Patient Transfer (multiple staff / Harrel Lemon Lift / Similar devices) 0 []  - Simple Staple / Suture removal (25 or less) 0 []  - Complex Staple / Suture removal (26 or more) 0 []  - Hypo / Hyperglycemic Management (close monitor of Blood Glucose) 0 []  - Ankle / Brachial Index (ABI) - do not check if billed separately 0 X - Vital Signs 1 5 Has the patient been seen at the hospital within the last three years: Yes Total Score: 110 Level Of Care: New/Established - Level 3 Electronic Signature(s) Signed: 01/03/2017 3:47:26 PM By: Alric Quan Entered By: Alric Quan on 12/31/2016 10:12:29 Donna Fisher ([id number]) -------------------------------------------------------------------------------- Encounter Discharge Information Details Patient Name: Donna Fisher Date of Service: 12/31/2016 9:15 AM Medical Record Number: [id number] Patient Account Number: [id number] Date of Birth/Sex: 04/02/1966 (51 y.o. Female) Treating RN: Carolyne Fiscal, Debi Primary Care Pasqualino Witherspoon: PATIENT, NO Other Clinician: Referring Rahim Astorga: Treating Delvecchio Madole/Extender:  Frann Rider in Treatment: 1 Encounter Discharge Information Items Discharge Pain Level: 7 Discharge Condition: Stable Ambulatory Status: Ambulatory Emergency Discharge Destination: Room Transportation: Private Auto Accompanied By: self Schedule Follow-up Appointment: Yes Medication Reconciliation completed and provided to Patient/Care No Kabella Cassidy: Provided on Clinical Summary of Care: 12/31/2016 Form Type Recipient Paper Patient TB Electronic Signature(s) Signed: 12/31/2016 9:48:03 AM By: Ruthine Dose Previous Signature: 12/31/2016 9:45:48 AM Version By: Ruthine Dose Entered By: Ruthine Dose on 12/31/2016 09:48:03 Donna Fisher ([id number]) -------------------------------------------------------------------------------- General Visit Notes Details Patient Name: Donna Fisher Date of Service: 12/31/2016 9:15 AM Medical Record Number: [id number] Patient Account Number: [id number] Date of Birth/Sex: [birth year] (51 y.o. Female) Treating RN: Carolyne Fiscal, Debi Primary Care Easter Kennebrew: PATIENT, NO Other Clinician: Referring Casy Tavano: Treating Vittoria Noreen/Extender: Frann Rider in Treatment: 1 Notes Pt started walking to room and was off balance and I asked if she was okay and she said no that she has had chills, nausea and not feeling well for 3 days. Her heart rate was elevated when I initially took her BP it was low and I took it 2 more times and got a higher reading. I did tell Dr. Con Memos about it. No temp at this time. Pt's wound, around the wound and foot was discolored dark and bright red and extremely painful to touch. Dr. Con Memos sent pt to ER. Pt was picked up by [place] car and taken over to [facility] ER. Electronic Signature(s) Signed: 12/31/2016 10:14:19 AM By: Alric Quan Previous Signature: 12/31/2016 10:13:20 AM Version By: Alric Quan Previous Signature: 12/31/2016 9:55:08 AM Version By: Alric Quan Entered By: Alric Quan on  12/31/2016 10:14:19 Donna Fisher ([id number]) -------------------------------------------------------------------------------- Lower Extremity Assessment Details Patient Name: Donna Fisher Date of Service: 12/31/2016 9:15 AM Medical Record Number: [id number] Patient Account Number: [id number] Date of Birth/Sex: 03-26-1966 (52 y.o. Female) Treating  RN: Ahmed Prima Primary Care Lovely Kerins: PATIENT, NO Other Clinician: Referring Gregary Blackard: Treating Wende Longstreth/Extender: Frann Rider in Treatment: 1 Vascular Assessment Pulses: Dorsalis Pedis Palpable: [Right:Yes] Posterior Tibial Extremity colors, hair growth, and conditions: Extremity Color: [Right:Normal] Temperature of Extremity: [Right:Warm] Capillary Refill: [Right:< 3 seconds] Electronic Signature(s) Signed: 01/03/2017 3:47:26 PM By: Alric Quan Entered By: Alric Quan on 12/31/2016 09:33:27 Hanaway, Lynelle Smoke D. ([id number]) -------------------------------------------------------------------------------- Multi Wound Chart Details Patient Name: Donna Fisher Date of Service: 12/31/2016 9:15 AM Medical Record Number: [id number] Patient Account Number: [id number] Date of Birth/Sex: Mar 12, 1966 (51 y.o. Female) Treating RN: Carolyne Fiscal, Debi Primary Care Waleska Buttery: PATIENT, NO Other Clinician: Referring Beautifull Cisar: Treating Londell Noll/Extender: Frann Rider in Treatment: 1 Vital Signs Height(in): 64 Pulse(bpm): 120 Weight(lbs): 162 Blood Pressure 110/59 (mmHg): Body Mass Index(BMI): 28 Temperature(F): 98.9 Respiratory Rate 18 (breaths/min): Photos: [1:No Photos] [N/A:N/A] Wound Location: [1:Left Foot] [N/A:N/A] Wounding Event: [1:Gradually Appeared] [N/A:N/A] Primary Etiology: [1:Diabetic Wound/Ulcer of the Lower Extremity] [N/A:N/A] Comorbid History: [1:Type II Diabetes, Neuropathy] [N/A:N/A] Date Acquired: [1:06/28/2016] [N/A:N/A] Weeks of Treatment: [1:1] [N/A:N/A] Wound Status: [1:Open]  [N/A:N/A] Measurements L x W x D 1.8x1.1x1.7 [N/A:N/A] (cm) Area (cm) : [1:1.555] [N/A:N/A] Volume (cm) : [1:2.644] [N/A:N/A] % Reduction in Area: [1:-10.00%] [N/A:N/A] % Reduction in Volume: -10.00% [N/A:N/A] Classification: [1:Grade 2] [N/A:N/A] Exudate Amount: [1:Large] [N/A:N/A] Exudate Type: [1:Purulent] [N/A:N/A] Exudate Color: [1:yellow, brown, green] [N/A:N/A] Foul Odor After [1:Yes] [N/A:N/A] Cleansing: Odor Anticipated Due to No [N/A:N/A] Product Use: Wound Margin: [1:Flat and Intact] [N/A:N/A] Granulation Amount: [1:Medium (34-66%)] [N/A:N/A] Granulation Quality: [1:Red] [N/A:N/A] Necrotic Amount: [1:Medium (34-66%)] [N/A:N/A] Necrotic Tissue: [1:Eschar, Adherent Slough] [N/A:N/A] Exposed Structures: Fat Layer (Subcutaneous N/A N/A Tissue) Exposed: Yes Muscle: Yes Fascia: No Tendon: No Joint: No Bone: No Epithelialization: None N/A N/A Periwound Skin Texture: Callus: Yes N/A N/A Excoriation: No Induration: No Crepitus: No Rash: No Scarring: No Periwound Skin Maceration: No N/A N/A Moisture: Dry/Scaly: No Periwound Skin Color: Erythema: Yes N/A N/A Rubor: Yes Atrophie Blanche: No Cyanosis: No Ecchymosis: No Hemosiderin Staining: No Mottled: No Pallor: No Erythema Location: Circumferential N/A N/A Temperature: Hot N/A N/A Tenderness on Yes N/A N/A Palpation: Wound Preparation: Ulcer Cleansing: N/A N/A Rinsed/Irrigated with Saline Topical Anesthetic Applied: Other: lidocaine 4% Treatment Notes Wound #1 (Left Foot) 1. Cleansed with: Clean wound with Normal Saline 2. Anesthetic Topical Lidocaine 4% cream to wound bed prior to debridement 4. Dressing Applied: Aquacel Ag 5. Secondary Dressing Applied Bordered Foam Dressing Dry Gauze Notes secured lightly with MALARIE, TAPPEN ([id number]) Electronic Signature(s) Signed: 12/31/2016 10:01:38 AM By: Christin Fudge MD, FACS Entered By: Christin Fudge on 12/31/2016 10:01:38 Donna Fisher ([id number]) -------------------------------------------------------------------------------- [place] Details Patient Name: Donna Fisher Date of Service: 12/31/2016 9:15 AM Medical Record Number: [id number] Patient Account Number: [id number] Date of Birth/Sex: 17-Aug-1965 (51 y.o. Female) Treating RN: Carolyne Fiscal, Debi Primary Care Reilley Latorre: PATIENT, NO Other Clinician: Referring Julizza Sassone: Treating Javeah Loeza/Extender: Frann Rider in Treatment: 1 Active Inactive Electronic Signature(s) Signed: 01/12/2017 2:48:47 PM By: Gretta Cool BSN, RN, CWS, Kim RN, BSN Signed: 01/13/2017 1:34:34 PM By: Alric Quan Previous Signature: 01/03/2017 3:47:26 PM Version By: Alric Quan Entered By: Gretta Cool BSN, RN, CWS, Kim on 01/12/2017 14:48:46 Donna Fisher ([id number]) -------------------------------------------------------------------------------- Pain Assessment Details Patient Name: Donna Fisher Date of Service: 12/31/2016 9:15 AM Medical Record Number: [id number] Patient Account Number: [id number] Date of Birth/Sex: 03/08/1966 (51 y.o. Female) Treating RN: Ahmed Prima Primary Care Destani Wamser: PATIENT, NO Other Clinician: Referring Shaneka Efaw: Treating Kailan Laws/Extender: Frann Rider in Treatment: 1 Active Problems  Location of Pain Severity and Description of Pain Patient Has Paino Yes Site Locations Pain Location: Pain in Ulcers With Dressing Change: Yes Rate the pain. Current Pain Level: 8 Character of Pain Describe the Pain: Aching, Burning, Tender, Throbbing Pain Management and Medication Current Pain Management: Electronic Signature(s) Signed: 01/03/2017 3:47:26 PM By: Alric Quan Entered By: Alric Quan on 12/31/2016 09:18:21 Donna Fisher ([id number]) -------------------------------------------------------------------------------- Patient/Caregiver Education Details Patient Name: Donna Fisher Date of Service:  12/31/2016 9:15 AM Medical Record Number: [id number] Patient Account Number: [id number] Date of Birth/Gender: 07-22-1965 (51 y.o. Female) Treating RN: Carolyne Fiscal, Debi Primary Care Physician: PATIENT, NO Other Clinician: Referring Physician: Treating Physician/Extender: Frann Rider in Treatment: 1 Education Assessment Education Provided To: Patient Education Topics Provided Wound/Skin Impairment: Handouts: Other: change dressing as ordered, go straight to ER Methods: Demonstration, Explain/Verbal Responses: State content correctly Electronic Signature(s) Signed: 01/03/2017 3:47:26 PM By: Alric Quan Entered By: Alric Quan on 12/31/2016 09:35:04 Eyvonne Left D. ([id number]) -------------------------------------------------------------------------------- Wound Assessment Details Patient Name: Donna Fisher Date of Service: 12/31/2016 9:15 AM Medical Record Number: [id number] Patient Account Number: [id number] Date of Birth/Sex: [birth year] (51 y.o. Female) Treating RN: Carolyne Fiscal, Debi Primary Care Sanjeev Main: PATIENT, NO Other Clinician: Referring Mujtaba Bollig: Treating Gennavieve Huq/Extender: Frann Rider in Treatment: 1 Wound Status Wound Number: 1 Primary Diabetic Wound/Ulcer of the Lower Etiology: Extremity Wound Location: Left Foot Wound Status: Open Wounding Event: Gradually Appeared Comorbid Type II Diabetes, Neuropathy Date Acquired: 06/28/2016 History: Weeks Of Treatment: 1 Clustered Wound: No Photos Wound Measurements Length: (cm) 1.8 Width: (cm) 1.1 Depth: (cm) 1.7 Area: (cm) 1.555 Volume: (cm) 2.644 % Reduction in Area: -10% % Reduction in Volume: -10% Epithelialization: None Tunneling: No Undermining: No Wound Description Classification: Grade 2 Wound Margin: Flat and Intact Exudate Amount: Large Exudate Type: Purulent Exudate Color: yellow, brown, green Foul Odor After Cleansing: Yes Due to Product Use: No Slough/Fibrino  Yes Wound Bed Granulation Amount: Medium (34-66%) Exposed Structure Granulation Quality: Red Fascia Exposed: No Necrotic Amount: Medium (34-66%) Fat Layer (Subcutaneous Tissue) Exposed: Yes Necrotic Quality: Eschar, Adherent Slough Tendon Exposed: No Muscle Exposed: Yes Fawcett, Corlis D. ([id number]) Necrosis of Muscle: Yes Joint Exposed: No Bone Exposed: No Periwound Skin Texture Texture Color No Abnormalities Noted: No No Abnormalities Noted: No Callus: Yes Atrophie Blanche: No Crepitus: No Cyanosis: No Excoriation: No Ecchymosis: No Induration: No Erythema: Yes Rash: No Erythema Location: Circumferential Scarring: No Hemosiderin Staining: No Mottled: No Moisture Pallor: No No Abnormalities Noted: No Rubor: Yes Dry / Scaly: No Maceration: No Temperature / Pain Temperature: Hot Tenderness on Palpation: Yes Wound Preparation Ulcer Cleansing: Rinsed/Irrigated with Saline Topical Anesthetic Applied: Other: lidocaine 4%, Electronic Signature(s) Signed: 01/03/2017 3:47:26 PM By: Alric Quan Entered By: Alric Quan on 12/31/2016 11:48:51 Donna Fisher ([id number]) -------------------------------------------------------------------------------- Vitals Details Patient Name: Donna Fisher Date of Service: 12/31/2016 9:15 AM Medical Record Number: [id number] Patient Account Number: [id number] Date of Birth/Sex: July 01, 1966 (51 y.o. Female) Treating RN: Carolyne Fiscal, Debi Primary Care Younis Mathey: PATIENT, NO Other Clinician: Referring Jamira Barfuss: Treating Norva Bowe/Extender: Frann Rider in Treatment: 1 Vital Signs Time Taken: 09:18 Temperature (F): 98.9 Height (in): 64 Pulse (bpm): 120 Weight (lbs): 162 Respiratory Rate (breaths/min): 18 Body Mass Index (BMI): 27.8 Blood Pressure (mmHg): 110/59 Reference Range: 80 - 120 mg / dl Electronic Signature(s) Signed: 01/03/2017 3:47:26 PM By: Alric Quan Entered By: Alric Quan on 12/31/2016  09:22:16

## 2017-01-03 NOTE — Care Management (Signed)
Faxed charity application to [employer] ([phone])
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
## 2017-01-03 NOTE — Progress Notes (Signed)
Physical Therapy Treatment Patient Details Name: Donna Fisher MRN: [id number] DOB: 01-Jun-1966 Today's Date: 01/03/2017    History of Present Illness 51 yo female with onset of L foot infection was admitted for I and D, now NWB on L foot with cellulitis and PMHx:  DM, neuropathy, heel and achilles spur, gas in tissues of L foot at toes.    PT Comments    Pt agreeable to PT despite fatigue and pain in L foot (8/10). Pt agreeable to supine exercises at this time only. Pt participates well in supine exercises actively on the left and strengthening on the right with manual resistance given. Pt positioned with greater elevation of Left lower extremity than found. Continue PT to progress strength, endurance and balance with NWB on R maintained to improve functional mobility and allow for an optimal, safe transition home.    Follow Up Recommendations  Home health PT;Supervision for mobility/OOB     Equipment Recommendations  Rolling walker with 5" wheels    Recommendations for Other Services       Precautions / Restrictions Precautions Precautions: Fall Restrictions Weight Bearing Restrictions: Yes LLE Weight Bearing: Non weight bearing    Mobility  Bed Mobility               General bed mobility comments: Pt refused out of bed at this time  Transfers                    Ambulation/Gait                 Stairs            Wheelchair Mobility    Modified Rankin (Stroke Patients Only)       Balance                                            Cognition Arousal/Alertness: Awake/alert (fatigued) Behavior During Therapy: WFL for tasks assessed/performed Overall Cognitive Status: Within Functional Limits for tasks assessed                                        Exercises General Exercises - Lower Extremity Ankle Circles/Pumps: AROM;Both;20 reps;Supine (limited range on L) Quad Sets: Strengthening;Both;20  reps;Supine Gluteal Sets: Strengthening;Both;20 reps;Supine Short Arc Quad: AROM;20 reps;Supine;Left (Strengthening R) Heel Slides: AROM;Both;20 reps;Supine (strengthening on R with resist) Hip ABduction/ADduction: AROM;Left;20 reps;Supine (strengthening on R) Straight Leg Raises: Strengthening;Both;20 reps;Supine    General Comments        Pertinent Vitals/Pain Pain Assessment: 0-10 Pain Score: 8  Pain Location: L foot Pain Descriptors / Indicators: Burning;Throbbing ("electrical shock") Pain Intervention(s): Limited activity within patient's tolerance;Premedicated before session (elevation)    Home Living                      Prior Function            PT Goals (current goals can now be found in the care plan section) Progress towards PT goals: Progressing toward goals (slowly)    Frequency    Min 2X/week      PT Plan Current plan remains appropriate    Co-evaluation              AM-PAC PT "6 Clicks" Daily Activity  Outcome  Measure  Difficulty turning over in bed (including adjusting bedclothes, sheets and blankets)?: A Little Difficulty moving from lying on back to sitting on the side of the bed? : A Little Difficulty sitting down on and standing up from a chair with arms (e.g., wheelchair, bedside commode, etc,.)?: Total Help needed moving to and from a bed to chair (including a wheelchair)?: A Little Help needed walking in hospital room?: A Lot Help needed climbing 3-5 steps with a railing? : A Lot 6 Click Score: 14    End of Session   Activity Tolerance: Patient tolerated treatment well;Patient limited by pain Patient left: in bed;with call bell/phone within reach;with bed alarm set   PT Visit Diagnosis: Unsteadiness on feet (R26.81);Muscle weakness (generalized) (M62.81);Difficulty in walking, not elsewhere classified (R26.2)     Time: 8638-1771 PT Time Calculation (min) (ACUTE ONLY): 27 min  Charges:  $Therapeutic Exercise: 23-37  mins                    G Codes:       Larae Grooms, PTA 01/03/2017, 12:03 PM

## 2017-01-04 LAB — CBC WITH DIFFERENTIAL/PLATELET
Basophils Absolute: 0.1 10*3/uL (ref 0–0.1)
Basophils Relative: 0 %
EOS PCT: 3 %
Eosinophils Absolute: 0.5 10*3/uL (ref 0–0.7)
HEMATOCRIT: 32.3 % — AB (ref 35.0–47.0)
Hemoglobin: 10.8 g/dL — ABNORMAL LOW (ref 12.0–16.0)
LYMPHS PCT: 5 %
Lymphs Abs: 1.1 10*3/uL (ref 1.0–3.6)
MCH: 29.5 pg (ref 26.0–34.0)
MCHC: 33.4 g/dL (ref 32.0–36.0)
MCV: 88.2 fL (ref 80.0–100.0)
MONO ABS: 1 10*3/uL — AB (ref 0.2–0.9)
Monocytes Relative: 5 %
NEUTROS ABS: 18 10*3/uL — AB (ref 1.4–6.5)
Neutrophils Relative %: 87 %
PLATELETS: 389 10*3/uL (ref 150–440)
RBC: 3.66 MIL/uL — ABNORMAL LOW (ref 3.80–5.20)
RDW: 12.6 % (ref 11.5–14.5)
WBC: 20.6 10*3/uL — ABNORMAL HIGH (ref 3.6–11.0)

## 2017-01-04 LAB — GLUCOSE, CAPILLARY
GLUCOSE-CAPILLARY: 182 mg/dL — AB (ref 65–99)
GLUCOSE-CAPILLARY: 254 mg/dL — AB (ref 65–99)
Glucose-Capillary: 119 mg/dL — ABNORMAL HIGH (ref 65–99)
Glucose-Capillary: 151 mg/dL — ABNORMAL HIGH (ref 65–99)
Glucose-Capillary: 215 mg/dL — ABNORMAL HIGH (ref 65–99)

## 2017-01-04 LAB — SURGICAL PATHOLOGY

## 2017-01-04 LAB — C-REACTIVE PROTEIN: CRP: 12.1 mg/dL — AB (ref <1.0)

## 2017-01-04 NOTE — Progress Notes (Signed)
[place] INFECTIOUS DISEASE PROGRESS NOTE Date of Admission:  12/31/2016     ID: Donna Fisher is a 51 y.o. female with L foot DM abscess Active Problems:   Cellulitis of left foot   Subjective: A little less pain in foot, low grade fevers.   ROS  Eleven systems are reviewed and negative except per hpi  Medications:  Antibiotics Given (last 72 hours)    Date/Time Action Medication Dose Rate   01/01/17 1629 New Bag/Given   vancomycin (VANCOCIN) 1,250 mg in sodium chloride 0.9 % 250 mL IVPB 1,250 mg 166.7 mL/hr   01/01/17 2111 New Bag/Given   meropenem (MERREM) 1 g in sodium chloride 0.9 % 100 mL IVPB 1 g 200 mL/hr   01/02/17 0533 New Bag/Given   meropenem (MERREM) 1 g in sodium chloride 0.9 % 100 mL IVPB 1 g 200 mL/hr   01/02/17 1622 New Bag/Given   vancomycin (VANCOCIN) 1,250 mg in sodium chloride 0.9 % 250 mL IVPB 1,250 mg 166.7 mL/hr   01/02/17 1757 New Bag/Given   meropenem (MERREM) 1 g in sodium chloride 0.9 % 100 mL IVPB 1 g 200 mL/hr   01/03/17 0506 New Bag/Given   meropenem (MERREM) 1 g in sodium chloride 0.9 % 100 mL IVPB 1 g 200 mL/hr   01/03/17 1306 New Bag/Given   meropenem (MERREM) 1 g in sodium chloride 0.9 % 100 mL IVPB 1 g 200 mL/hr   01/03/17 1547 New Bag/Given   cefTRIAXone (ROCEPHIN) 2 g in dextrose 5 % 50 mL IVPB 2 g 100 mL/hr   01/03/17 1547 Given   metroNIDAZOLE (FLAGYL) tablet 500 mg 500 mg    01/03/17 2203 Given   metroNIDAZOLE (FLAGYL) tablet 500 mg 500 mg    01/04/17 0548 Given   metroNIDAZOLE (FLAGYL) tablet 500 mg 500 mg    01/04/17 1241 Given   metroNIDAZOLE (FLAGYL) tablet 500 mg 500 mg      . docusate sodium  100 mg Oral BID  . enoxaparin (LOVENOX) injection  40 mg Subcutaneous Q24H  . insulin aspart  0-15 Units Subcutaneous TID WC  . insulin aspart  0-5 Units Subcutaneous QHS  . insulin aspart  4 Units Subcutaneous TID WC  . insulin detemir  25 Units Subcutaneous QHS  . metroNIDAZOLE  500 mg Oral Q8H  . sodium chloride flush   10-40 mL Intracatheter Q12H    Objective: Vital signs in last 24 hours: Temp:  [99.2 F (37.3 C)-100.2 F (37.9 C)] 99.2 F (37.3 C) (06/19 1321) Pulse Rate:  [87-96] 96 (06/19 1321) Resp:  [16-18] 18 (06/19 1321) BP: (91-118)/(52-76) 118/52 (06/19 1321) SpO2:  [93 %-100 %] 93 % (06/19 1321) Weight:  [86.8 kg (191 lb 4.8 oz)] 86.8 kg (191 lb 4.8 oz) (06/19 0500) Constitutional:  Obese, oriented to person, place, and time. appears well-developed and well-nourished. No distress.  HENT: [place]/AT, PERRLA, no scleral icterus Mouth/Throat: Oropharynx is clear and moist. No oropharyngeal exudate.  Cardiovascular: Normal rate, regular rhythm and normal heart sounds. Pulmonary/Chest: Effort normal and breath sounds normal. No respiratory distress.  has no wheezes.  Neck = supple, no nuchal rigidity Abdominal: Soft. Bowel sounds are normal.  exhibits no distension. There is no tenderness.  Lymphadenopathy: no cervical adenopathy. No axillary adenopathy Ext RLE with 1+ edema Vascular has 1+ DP pulse on LLE Neurological: alert and oriented to person, place, and time. PN BilLE Skin: L foot has plantar incisions with packing in place, decreased erythema, and mild-mod tenderness. Dorsum of  foot hs ild swollen and mildly tender as well. Psychiatric: a normal mood and affect.  behavior is normal.   Lab Results  Recent Labs  01/02/17 0401 01/03/17 0549 01/04/17 1121  WBC 24.4* 15.5* 20.6*  HGB 10.5* 10.4* 10.8*  HCT 30.8* 31.4* 32.3*  NA 138 135  --   K 3.5 3.4*  --   CL 109 107  --   CO2 26 25  --   BUN 24* 17  --   CREATININE 0.96 0.66  --     Microbiology: Results for orders placed or performed during the hospital encounter of 12/31/16  Blood culture (routine x 2)     Status: None (Preliminary result)   Collection Time: 12/31/16 10:20 AM  Result Value Ref Range Status   Specimen Description BLOOD LEFT ANTECUBITAL  Final   Special Requests   Final    BOTTLES DRAWN AEROBIC AND  ANAEROBIC Blood Culture adequate volume   Culture NO GROWTH 4 DAYS  Final   Report Status PENDING  Incomplete  Urine culture     Status: Abnormal   Collection Time: 12/31/16 10:22 AM  Result Value Ref Range Status   Specimen Description URINE, CLEAN CATCH  Final   Special Requests NONE  Final   Culture (A)  Final    <10,000 COLONIES/mL INSIGNIFICANT GROWTH Performed at [facility], [address], [place], [place] [address]    Report Status 01/01/2017 FINAL  Final  Blood culture (routine x 2)     Status: None (Preliminary result)   Collection Time: 12/31/16 10:23 AM  Result Value Ref Range Status   Specimen Description BLOOD LEFT FEMORAL ARTERY  Final   Special Requests   Final    BOTTLES DRAWN AEROBIC AND ANAEROBIC Blood Culture adequate volume   Culture NO GROWTH 4 DAYS  Final   Report Status PENDING  Incomplete  Surgical pcr screen     Status: None   Collection Time: 12/31/16  2:40 PM  Result Value Ref Range Status   MRSA, PCR NEGATIVE NEGATIVE Final   Staphylococcus aureus NEGATIVE NEGATIVE Final    Comment:        The Xpert SA Assay (FDA approved for NASAL specimens in patients over 79 years of age), is one component of a comprehensive surveillance program.  Test performance has been validated by [facility] for patients greater than or equal to 1 year old. It is not intended to diagnose infection nor to guide or monitor treatment.   Aerobic/Anaerobic Culture (surgical/deep wound)     Status: None (Preliminary result)   Collection Time: 12/31/16  4:30 PM  Result Value Ref Range Status   Specimen Description WOUND  Final   Special Requests NONE  Final   Gram Stain   Final    ABUNDANT WBC PRESENT, PREDOMINANTLY PMN RARE SQUAMOUS EPITHELIAL CELLS PRESENT ABUNDANT GRAM POSITIVE COCCI IN PAIRS MODERATE GRAM NEGATIVE RODS FEW GRAM POSITIVE RODS Performed at [facility], [place] [address], [place], [place] [address]    Culture   Final    MODERATE  STREPTOCOCCUS GROUP G NO ANAEROBES ISOLATED; CULTURE IN PROGRESS FOR 5 DAYS    Report Status PENDING  Incomplete  CULTURE, BLOOD (ROUTINE X 2) w Reflex to ID Panel     Status: None (Preliminary result)   Collection Time: 01/02/17  1:15 PM  Result Value Ref Range Status   Specimen Description BLOOD RIGHT ANTECUBITAL  Final   Special Requests   Final  BOTTLES DRAWN AEROBIC AND ANAEROBIC Blood Culture adequate volume   Culture NO GROWTH 2 DAYS  Final   Report Status PENDING  Incomplete  CULTURE, BLOOD (ROUTINE X 2) w Reflex to ID Panel     Status: None (Preliminary result)   Collection Time: 01/02/17  1:15 PM  Result Value Ref Range Status   Specimen Description BLOOD BLOOD RIGHT FOREARM  Final   Special Requests   Final    BOTTLES DRAWN AEROBIC AND ANAEROBIC Blood Culture adequate volume   Culture NO GROWTH 2 DAYS  Final   Report Status PENDING  Incomplete   Lab Results  Component Value Date   ESRSEDRATE 55 (H) 11/02/2014   Lab Results  Component Value Date   CRP 12.1 (H) 01/04/2017    Studies/Results: No results found.  Assessment/Plan: Donna Fisher is a 51 y.o. 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 Has wound vac in place. PCN allergic but she cannot recall allergy.  She was on cipro prior to admission.  MRSA PCR negative. ESR 55, crp 12.1 The infection is quite severe and limb threatening.  She has a palpable pulse of the foot. Was a 1 ppd smoker but quit 2 weeks ago. Encouraged her to remain off tobacco to help wound heal.  Still with low grade fevers, elevated WBC but overall I think the foot looks a little better today  Recommendations Continue  IV ceftriaxone and oral flagyl. Continue to monitor and if recurrent fevers or persistent leukocytosis consider MRI foot.  She would benefit from several weeks of IV abx with the  above regimen (unless cultures grow other organisms. Thank you very much for the consult. Will follow with you.  Vinie Charity P   01/04/2017, 2:18 PM
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
## 2017-01-04 NOTE — Plan of Care (Signed)
Dr. Text to inform of  BP currently 91/55.

## 2017-01-04 NOTE — Plan of Care (Signed)
Dr. Weston Brass Wound vac authorization form and it was faxed to [phone] as form instructed.  Dr. Aleene Davidson to return later this afternoon to place vac.

## 2017-01-04 NOTE — Progress Notes (Signed)
[place] [facility]                                                                                                                                                                                  Patient Demographics   Donna Fisher, is a 51 y.o. female, DOB - 12/08/1965, QBH:[id number]  Admit date - 12/31/2016   Admitting Physician Hillary Bow, MD  Outpatient Primary MD for the patient is Patient, No Pcp Per   LOS - 4  Subjective: Patient has pain in her legs denies any chest pain or shortness of breath states that her hands are swollen  Review of Systems:   CONSTITUTIONAL: Hypothermia yest. No fatigue, weakness. No weight gain, no weight loss.  EYES: No blurry or double vision.  ENT: No tinnitus. No postnasal drip. No redness of the oropharynx.  RESPIRATORY: No cough, no wheeze, no hemoptysis. No dyspnea.  CARDIOVASCULAR: No chest pain. No orthopnea. No palpitations. No syncope.  GASTROINTESTINAL: No nausea, no vomiting or diarrhea. No abdominal pain. No melena or hematochezia.  GENITOURINARY: No dysuria or hematuria.  ENDOCRINE: No polyuria or nocturia. No heat or cold intolerance.  HEMATOLOGY: No anemia. No bruising. No bleeding.  INTEGUMENTARY: No rashes. No lesions.  MUSCULOSKELETAL: No arthritis. No swelling. No gout.  NEUROLOGIC: No numbness, tingling, or ataxia. No seizure-type activity.  PSYCHIATRIC: No anxiety. No insomnia. No ADD.    Vitals:   Vitals:   01/04/17 0500 01/04/17 0957 01/04/17 1110 01/04/17 1225  BP:   (!) 91/55 110/60  Pulse:  93 94 90  Resp:   16 18  Temp:   99.2 F (37.3 C) 100.2 F (37.9 C)  TempSrc:   Oral Oral  SpO2:  98% 100% 96%  Weight: 191 lb 4.8 oz (86.8 kg)     Height:        Wt Readings from Last 3 Encounters:  01/04/17 191 lb 4.8 oz (86.8 kg)  09/09/16 158 lb (71.7 kg)  12/25/15 160 lb (72.6 kg)     Intake/Output Summary (Last 24 hours) at 01/04/17 1230 Last data filed at 01/04/17 0800   Gross per 24 hour  Intake          2281.67 ml  Output              450 ml  Net          1831.67 ml    Physical Exam:   GENERAL: Pleasant-appearing in no apparent distress.  HEAD, EYES, EARS, NOSE AND THROAT: Atraumatic, normocephalic. Extraocular muscles are intact. Pupils equal and reactive to light. Sclerae anicteric. No conjunctival injection. No oro-pharyngeal erythema.  NECK: Supple. There is no  jugular venous distention. No bruits, no lymphadenopathy, no thyromegaly.  HEART: Regular rate and rhythm,. No murmurs, no rubs, no clicks.  LUNGS: Clear to auscultation bilaterally. No rales or rhonchi. No wheezes.  ABDOMEN: Soft, flat, nontender, nondistended. Has good bowel sounds. No hepatosplenomegaly appreciated.  EXTREMITIES: Left foot dressing in place positive edema of the extremity NEUROLOGIC: The patient is alert, awake, and oriented x3 with no focal motor or sensory deficits appreciated bilaterally.  SKIN: Moist and warm with no rashes appreciated.  Psych: Not anxious, depressed LN: No inguinal LN enlargement    Antibiotics   Anti-infectives    Start     Dose/Rate Route Frequency Ordered Stop   01/03/17 1500  cefTRIAXone (ROCEPHIN) 2 g in dextrose 5 % 50 mL IVPB     2 g 100 mL/hr over 30 Minutes Intravenous Every 24 hours 01/03/17 1414     01/03/17 1415  metroNIDAZOLE (FLAGYL) tablet 500 mg     500 mg Oral Every 8 hours 01/03/17 1414     01/03/17 1400  meropenem (MERREM) 1 g in sodium chloride 0.9 % 100 mL IVPB  Status:  Discontinued     1 g 200 mL/hr over 30 Minutes Intravenous Every 8 hours 01/03/17 1030 01/03/17 1414   12/31/16 1800  meropenem (MERREM) 1 g in sodium chloride 0.9 % 100 mL IVPB  Status:  Discontinued     1 g 200 mL/hr over 30 Minutes Intravenous Every 12 hours 12/31/16 1204 01/03/17 1030   12/31/16 1630  vancomycin (VANCOCIN) 1,250 mg in sodium chloride 0.9 % 250 mL IVPB  Status:  Discontinued     1,250 mg 166.7 mL/hr over 90 Minutes Intravenous Every  24 hours 12/31/16 1213 01/03/17 1414   12/31/16 1600  vancomycin (VANCOCIN) 1,250 mg in sodium chloride 0.9 % 250 mL IVPB  Status:  Discontinued     1,250 mg 166.7 mL/hr over 90 Minutes Intravenous Every 24 hours 12/31/16 1204 12/31/16 1213   12/31/16 1045  aztreonam (AZACTAM) 2 g in dextrose 5 % 50 mL IVPB     2 g 100 mL/hr over 30 Minutes Intravenous  Once 12/31/16 1022 12/31/16 1113   12/31/16 1030  metroNIDAZOLE (FLAGYL) IVPB 500 mg     500 mg 100 mL/hr over 60 Minutes Intravenous  Once 12/31/16 1022 12/31/16 1200   12/31/16 1030  vancomycin (VANCOCIN) IVPB 1000 mg/200 mL premix     1,000 mg 200 mL/hr over 60 Minutes Intravenous  Once 12/31/16 1022 12/31/16 1217      Medications   Scheduled Meds: . docusate sodium  100 mg Oral BID  . enoxaparin (LOVENOX) injection  40 mg Subcutaneous Q24H  . insulin aspart  0-15 Units Subcutaneous TID WC  . insulin aspart  0-5 Units Subcutaneous QHS  . insulin aspart  4 Units Subcutaneous TID WC  . insulin detemir  25 Units Subcutaneous QHS  . metroNIDAZOLE  500 mg Oral Q8H  . sodium chloride flush  10-40 mL Intracatheter Q12H   Continuous Infusions: . cefTRIAXone (ROCEPHIN)  IV Stopped (01/03/17 1617)   PRN Meds:.acetaminophen **OR** acetaminophen, albuterol, HYDROcodone-acetaminophen, ondansetron **OR** ondansetron (ZOFRAN) IV, oxyCODONE **OR** oxyCODONE, polyethylene glycol, promethazine, sodium chloride flush   Data Review:   Micro Results Recent Results (from the past 240 hour(s))  Blood culture (routine x 2)     Status: None (Preliminary result)   Collection Time: 12/31/16 10:20 AM  Result Value Ref Range Status   Specimen Description BLOOD LEFT ANTECUBITAL  Final   Special Requests  Final    BOTTLES DRAWN AEROBIC AND ANAEROBIC Blood Culture adequate volume   Culture NO GROWTH 4 DAYS  Final   Report Status PENDING  Incomplete  Urine culture     Status: Abnormal   Collection Time: 12/31/16 10:22 AM  Result Value Ref Range  Status   Specimen Description URINE, CLEAN CATCH  Final   Special Requests NONE  Final   Culture (A)  Final    <10,000 COLONIES/mL INSIGNIFICANT GROWTH Performed at [facility], [place] [address], [place], [place] [address]    Report Status 01/01/2017 FINAL  Final  Blood culture (routine x 2)     Status: None (Preliminary result)   Collection Time: 12/31/16 10:23 AM  Result Value Ref Range Status   Specimen Description BLOOD LEFT FEMORAL ARTERY  Final   Special Requests   Final    BOTTLES DRAWN AEROBIC AND ANAEROBIC Blood Culture adequate volume   Culture NO GROWTH 4 DAYS  Final   Report Status PENDING  Incomplete  Surgical pcr screen     Status: None   Collection Time: 12/31/16  2:40 PM  Result Value Ref Range Status   MRSA, PCR NEGATIVE NEGATIVE Final   Staphylococcus aureus NEGATIVE NEGATIVE Final    Comment:        The Xpert SA Assay (FDA approved for NASAL specimens in patients over 32 years of age), is one component of a comprehensive surveillance program.  Test performance has been validated by [facility] for patients greater than or equal to 21 year old. It is not intended to diagnose infection nor to guide or monitor treatment.   Aerobic/Anaerobic Culture (surgical/deep wound)     Status: None (Preliminary result)   Collection Time: 12/31/16  4:30 PM  Result Value Ref Range Status   Specimen Description WOUND  Final   Special Requests NONE  Final   Gram Stain   Final    ABUNDANT WBC PRESENT, PREDOMINANTLY PMN RARE SQUAMOUS EPITHELIAL CELLS PRESENT ABUNDANT GRAM POSITIVE COCCI IN PAIRS MODERATE GRAM NEGATIVE RODS FEW GRAM POSITIVE RODS Performed at [facility], [place] [address], [place], [place] [address]    Culture   Final    MODERATE STREPTOCOCCUS GROUP G NO ANAEROBES ISOLATED; CULTURE IN PROGRESS FOR 5 DAYS    Report Status PENDING  Incomplete  CULTURE, BLOOD (ROUTINE X 2) w Reflex to ID Panel     Status: None (Preliminary result)   Collection  Time: 01/02/17  1:15 PM  Result Value Ref Range Status   Specimen Description BLOOD RIGHT ANTECUBITAL  Final   Special Requests   Final    BOTTLES DRAWN AEROBIC AND ANAEROBIC Blood Culture adequate volume   Culture NO GROWTH 2 DAYS  Final   Report Status PENDING  Incomplete  CULTURE, BLOOD (ROUTINE X 2) w Reflex to ID Panel     Status: None (Preliminary result)   Collection Time: 01/02/17  1:15 PM  Result Value Ref Range Status   Specimen Description BLOOD BLOOD RIGHT FOREARM  Final   Special Requests   Final    BOTTLES DRAWN AEROBIC AND ANAEROBIC Blood Culture adequate volume   Culture NO GROWTH 2 DAYS  Final   Report Status PENDING  Incomplete    Radiology Reports Dg Foot Complete Left  Result Date: 12/31/2016 CLINICAL DATA:  Diabetic ulcer on the bottom of the foot over the medial aspect of the great toe. EXAM: LEFT FOOT - COMPLETE 3+ VIEW COMPARISON:  Left foot series  of December 24, 2016 FINDINGS: The bones are subjectively mildly osteopenic. No lytic or blastic bony lesions are observed. Subtle osteoarthritic changes are noted of the first MTP joint and of the fifth MTP joint. There are plantar and Achilles region calcaneal spurs. There is small amount of soft tissue gas in the interspace between the first and second toes. There is gas over the base of the proximal phalanx of the great toe. There is mild soft tissue swelling over the dorsum of the foot with arterial calcification noted. IMPRESSION: Findings compatible with a known foot ulcer over the base of the great toe. No bony changes of osteomyelitis are observed. There are mild osteoarthritic changes elsewhere and there are plantar and Achilles region calcaneal spurs. Electronically Signed   By: David  Martinique M.D.   On: 12/31/2016 10:41   Dg Foot Complete Left  Result Date: 12/24/2016 CLINICAL DATA:  Diabetic ulcer with pain EXAM: LEFT FOOT - COMPLETE 3+ VIEW COMPARISON:  September 09, 2016 FINDINGS: Frontal, oblique, and lateral views  were obtained. There is no evident fracture or dislocation. There is osteoarthritic change in the first MTP joint. There is also mild spurring in the dorsal midfoot. No erosive change or bony destruction. No soft tissue air or radiopaque foreign body evident. There are posterior and inferior calcaneal spurs. IMPRESSION: Areas of osteoarthritic change. Calcaneal spurs. No fracture or dislocation. No erosive change or bony destruction. No radiopaque foreign body. No soft tissue air to suggest abscess seen by radiography. Electronically Signed   By: Lowella Grip III M.D.   On: 12/24/2016 10:02     CBC  Recent Labs Lab 12/31/16 1020 01/01/17 0441 01/02/17 0401 01/03/17 0549 01/04/17 1121  WBC 28.9* 26.9* 24.4* 15.5* 20.6*  HGB 12.4 10.5* 10.5* 10.4* 10.8*  HCT 36.0 31.0* 30.8* 31.4* 32.3*  PLT 342 279 309 339 389  MCV 88.0 87.8 87.8 87.9 88.2  MCH 30.3 29.6 29.9 29.1 29.5  MCHC 34.4 33.8 34.0 33.0 33.4  RDW 12.5 12.4 12.5 12.5 12.6  LYMPHSABS 0.9*  --   --   --  1.1  MONOABS 1.7*  --   --   --  1.0*  EOSABS 0.0  --   --   --  0.5  BASOSABS 0.1  --   --   --  0.1    Chemistries   Recent Labs Lab 12/31/16 1020 01/01/17 0441 01/02/17 0401 01/03/17 0549  NA 133* 134* 138 135  K 3.8 4.6 3.5 3.4*  CL 95* 102 109 107  CO2 26 25 26 25   GLUCOSE 344* 361* 287* 99  BUN 31* 26* 24* 17  CREATININE 1.37* 0.81 0.96 0.66  CALCIUM 9.1 8.0* 8.0* 7.7*  AST 26  --   --   --   ALT 20  --   --   --   ALKPHOS 126  --   --   --   BILITOT 1.1  --   --   --    ------------------------------------------------------------------------------------------------------------------ estimated creatinine clearance is 91.5 mL/min (by C-G formula based on SCr of 0.66 mg/dL). ------------------------------------------------------------------------------------------------------------------ No results for input(s): HGBA1C in the last 72  hours. ------------------------------------------------------------------------------------------------------------------ No results for input(s): CHOL, HDL, LDLCALC, TRIG, CHOLHDL, LDLDIRECT in the last 72 hours. ------------------------------------------------------------------------------------------------------------------ No results for input(s): TSH, T4TOTAL, T3FREE, THYROIDAB in the last 72 hours.  Invalid input(s): FREET3 ------------------------------------------------------------------------------------------------------------------ No results for input(s): VITAMINB12, FOLATE, FERRITIN, TIBC, IRON, RETICCTPCT in the last 72 hours.  Coagulation profile No results for input(s): INR,  PROTIME in the last 168 hours.  No results for input(s): DDIMER in the last 72 hours.  Cardiac Enzymes No results for input(s): CKMB, TROPONINI, MYOGLOBIN in the last 168 hours.  Invalid input(s): CK ------------------------------------------------------------------------------------------------------------------ Invalid input(s): [place]  Patient with left foot infection  *Sepsis with hypotension blood pressure now improved  Stop IV fluids Stop midrodrine Will assess her fluid status and blood pressure  *Left foot diabetic ulcer with cellulitis. Possible abscess. Sepsis present on admission. Status post incision and drainage await cultures Antibiotics as per ID will need prolonged antibiotics Wound VAC per podiatry  * Insulin dependent diabetes mellitus.  Continue insulin therapy Continue levemir blood sugars improved  *Hypotension due to sepsis now improved stop IV fluids  * Acute kidney injury due to sepsis. Resolved with IV fluid  * DVT prophylaxis. Start patient on Lovenox for DVT prophylaxis    Code Status Orders        Start     Ordered   12/31/16 1131  Full code  Continuous     12/31/16 1132    Code Status History    Date Active Date Inactive  Code Status Order ID Comments User Context   This patient has a current code status but no historical code status.           Consults podiatruy   DVT Prophylaxis  Lovenox -   Lab Results  Component Value Date   PLT 389 01/04/2017      Time Spent in minutes   42min  Greater than 50% of time spent in care coordination and counseling patient regarding the condition and plan of care.   Dustin Flock M.D on 01/04/2017 at 12:30 PM  Between 7am to 6pm - Pager - [phone]  After 6pm go to [email or web address] - password [email or web address] [facility]   Office  [phone]

## 2017-01-04 NOTE — Progress Notes (Signed)
Physical Therapy Treatment Patient Details Name: Donna Fisher MRN: [id number] DOB: 1965/11/27 Today's Date: 01/04/2017    History of Present Illness 51 yo female with onset of L foot infection was admitted for I and D, now NWB on L foot with cellulitis and PMHx:  DM, neuropathy, heel and achilles spur, gas in tissues of L foot at toes.    PT Comments    Pt agreeable to PT; reports fatigue this session, but denies pain currently in left foot. Pt demonstrates bed mobility with Mod I, Min guard and cues for hand placement with transfers, and Min guard for short distance (54ft) left NWB ambulation bed to chair. Pt participates in seated and long sit exercises today with noted increased fatigue. Pt up to the chair and encouraged exercises throughout the day. Continue PT to progress strength and endurance to improve functional mobility and balance complying with left NWB status.   Follow Up Recommendations  Home health PT;Supervision for mobility/OOB     Equipment Recommendations  Rolling walker with 5" wheels    Recommendations for Other Services       Precautions / Restrictions Precautions Precautions: Fall Restrictions Weight Bearing Restrictions: Yes LLE Weight Bearing: Non weight bearing    Mobility  Bed Mobility Overal bed mobility: Modified Independent Bed Mobility: Supine to Sit     Supine to sit: Modified independent (Device/Increase time)     General bed mobility comments: use of rail; mild increased time  Transfers Overall transfer level: Needs assistance Equipment used: Rolling walker (2 wheeled);1 person hand held assist Transfers: Sit to/from [facility] Sit to Stand: Min guard         General transfer comment: Min guard for safety; cues for safe hand placement with stand to sit  Ambulation/Gait Ambulation/Gait assistance: Min guard Ambulation Distance (Feet): 5 Feet Assistive device: Rolling walker (2 wheeled) Gait Pattern/deviations:   (L NWB)     General Gait Details: Demonstrates compliance with L NWB with good ability to hop on R; does fatigue easily   Stairs            Wheelchair Mobility    Modified Rankin (Stroke Patients Only)       Balance Overall balance assessment: Needs assistance Sitting-balance support: Feet supported Sitting balance-Leahy Scale: Good     Standing balance support: Bilateral upper extremity supported Standing balance-Leahy Scale: Fair                              Cognition Arousal/Alertness: Awake/alert Behavior During Therapy: WFL for tasks assessed/performed Overall Cognitive Status: Within Functional Limits for tasks assessed                                        Exercises General Exercises - Lower Extremity Ankle Circles/Pumps: AROM;Both;15 reps Quad Sets: Strengthening;Both;15 reps Gluteal Sets: Strengthening;Both;15 reps Long Arc Quad: AROM;Both;15 reps;Seated Heel Slides: AROM;Left;15 reps (Resisted extension on R 15x) Hip ABduction/ADduction: AROM;Left;15 reps (Resisted on R ) Other Exercises Other Exercises: Chair push ups 10x    General Comments        Pertinent Vitals/Pain Pain Assessment: No/denies pain    Home Living                      Prior Function            PT  Goals (current goals can now be found in the care plan section) Progress towards PT goals: Progressing toward goals    Frequency    Min 2X/week      PT Plan Current plan remains appropriate    Co-evaluation              AM-PAC PT "6 Clicks" Daily Activity  Outcome Measure  Difficulty turning over in bed (including adjusting bedclothes, sheets and blankets)?: None Difficulty moving from lying on back to sitting on the side of the bed? : None Difficulty sitting down on and standing up from a chair with arms (e.g., wheelchair, bedside commode, etc,.)?: Total Help needed moving to and from a bed to chair (including a  wheelchair)?: A Little Help needed walking in hospital room?: A Little Help needed climbing 3-5 steps with a railing? : A Lot 6 Click Score: 17    End of Session   Activity Tolerance: Patient limited by fatigue;Patient tolerated treatment well Patient left: in chair;with call bell/phone within reach;Other (comment) (refuses alarm; will call to get up)   PT Visit Diagnosis: Unsteadiness on feet (R26.81);Muscle weakness (generalized) (M62.81);Difficulty in walking, not elsewhere classified (R26.2)     Time: 9417-4081 PT Time Calculation (min) (ACUTE ONLY): 31 min  Charges:  $Gait Training: 8-22 mins $Therapeutic Exercise: 8-22 mins                    G Codes:        Larae Grooms, PTA 01/04/2017, 12:30 PM

## 2017-01-04 NOTE — Care Management (Addendum)
Need wound measurements from podiatry when they round. Primary nurse updated on request. Ricki with [facility] updated on progression.

## 2017-01-05 ENCOUNTER — Inpatient Hospital Stay: Payer: Medicaid Other

## 2017-01-05 LAB — CULTURE, BLOOD (ROUTINE X 2)
Culture: NO GROWTH
Culture: NO GROWTH
SPECIAL REQUESTS: ADEQUATE
SPECIAL REQUESTS: ADEQUATE

## 2017-01-05 LAB — CBC
HEMATOCRIT: 31.7 % — AB (ref 35.0–47.0)
Hemoglobin: 10.9 g/dL — ABNORMAL LOW (ref 12.0–16.0)
MCH: 30.7 pg (ref 26.0–34.0)
MCHC: 34.4 g/dL (ref 32.0–36.0)
MCV: 89.3 fL (ref 80.0–100.0)
Platelets: 339 10*3/uL (ref 150–440)
RBC: 3.55 MIL/uL — AB (ref 3.80–5.20)
RDW: 12.5 % (ref 11.5–14.5)
WBC: 16.4 10*3/uL — AB (ref 3.6–11.0)

## 2017-01-05 LAB — GLUCOSE, CAPILLARY
GLUCOSE-CAPILLARY: 143 mg/dL — AB (ref 65–99)
Glucose-Capillary: 95 mg/dL (ref 65–99)
Glucose-Capillary: 130 mg/dL — ABNORMAL HIGH (ref 65–99)
Glucose-Capillary: 134 mg/dL — ABNORMAL HIGH (ref 65–99)

## 2017-01-05 LAB — PROCALCITONIN: Procalcitonin: 0.17 ng/mL

## 2017-01-05 MED ORDER — SENNA 8.6 MG PO TABS
1.0000 | ORAL_TABLET | Freq: Every day | ORAL | Status: DC
  Administered 2017-01-05 – 2017-01-13 (×7): 8.6 mg via ORAL
  Filled 2017-01-05 (×8): qty 1

## 2017-01-05 MED ORDER — FUROSEMIDE 10 MG/ML IJ SOLN
10.0000 mg | Freq: Two times a day (BID) | INTRAMUSCULAR | Status: DC
  Administered 2017-01-05: 10 mg via INTRAVENOUS
  Filled 2017-01-05 (×2): qty 4

## 2017-01-05 MED ORDER — POLYETHYLENE GLYCOL 3350 17 G PO PACK
17.0000 g | PACK | Freq: Every day | ORAL | Status: DC
  Administered 2017-01-08 – 2017-01-09 (×2): 17 g via ORAL
  Filled 2017-01-05 (×6): qty 1

## 2017-01-05 NOTE — Progress Notes (Signed)
[place] at [facility]                                                                                                                                                                                  Patient Demographics   Donna Fisher, is a 51 y.o. female, DOB - [birth year], OVZ:[id number]  Admit date - 12/31/2016   Admitting Physician Hillary Bow, MD  Outpatient Primary MD for the patient is Patient, No Pcp Per   LOS - 5  Subjective: Patient states that she felt nauseous earlier today, and not feeling well Complains of swelling of her hands and face   Review of Systems:   CONSTITUTIONAL: Hypothermia yest. No fatigue, weakness. No weight gain, no weight loss.  EYES: No blurry or double vision.  ENT: No tinnitus. No postnasal drip. No redness of the oropharynx.  RESPIRATORY: No cough, no wheeze, no hemoptysis. No dyspnea.  CARDIOVASCULAR: No chest pain. No orthopnea. No palpitations. No syncope.  GASTROINTESTINAL: Positive nausea, no vomiting or diarrhea. No abdominal pain. No melena or hematochezia.  GENITOURINARY: No dysuria or hematuria.  ENDOCRINE: No polyuria or nocturia. No heat or cold intolerance.  HEMATOLOGY: No anemia. No bruising. No bleeding.  INTEGUMENTARY: No rashes. No lesions.  MUSCULOSKELETAL: No arthritis. No swelling. No gout.  NEUROLOGIC: No numbness, tingling, or ataxia. No seizure-type activity.  PSYCHIATRIC: No anxiety. No insomnia. No ADD.    Vitals:   Vitals:   01/04/17 1321 01/04/17 1550 01/04/17 2313 01/05/17 0730  BP: (!) 118/52 (!) 108/53 (!) 104/52 128/74  Pulse: 96 90 (!) 103 99  Resp: 18 18 18    Temp: 99.2 F (37.3 C) 99.4 F (37.4 C) (!) 101.1 F (38.4 C) 98.3 F (36.8 C)  TempSrc: Oral Oral Oral Oral  SpO2: 93% 97% 95% 100%  Weight:      Height:        Wt Readings from Last 3 Encounters:  01/04/17 191 lb 4.8 oz (86.8 kg)  09/09/16 158 lb (71.7 kg)  12/25/15 160 lb (72.6 kg)     Intake/Output  Summary (Last 24 hours) at 01/05/17 1421 Last data filed at 01/05/17 0800  Gross per 24 hour  Intake              410 ml  Output              350 ml  Net               60 ml    Physical Exam:   GENERAL: Pleasant-appearing in no apparent distress.  HEAD, EYES, EARS, NOSE AND THROAT: Atraumatic, normocephalic. Extraocular muscles are intact. Pupils equal and reactive to  light. Sclerae anicteric. No conjunctival injection. No oro-pharyngeal erythema.  NECK: Supple. There is no jugular venous distention. No bruits, no lymphadenopathy, no thyromegaly.  HEART: Regular rate and rhythm,. No murmurs, no rubs, no clicks.  LUNGS: Clear to auscultation bilaterally. No rales or rhonchi. No wheezes.  ABDOMEN: Soft, flat, nontender, nondistended. Has good bowel sounds. No hepatosplenomegaly appreciated.  EXTREMITIES: Left foot dressing in place positive edema of the extremity NEUROLOGIC: The patient is alert, awake, and oriented x3 with no focal motor or sensory deficits appreciated bilaterally.  SKIN: Moist and warm with no rashes appreciated.  Psych: Not anxious, depressed LN: No inguinal LN enlargement    Antibiotics   Anti-infectives    Start     Dose/Rate Route Frequency Ordered Stop   01/03/17 1500  cefTRIAXone (ROCEPHIN) 2 g in dextrose 5 % 50 mL IVPB     2 g 100 mL/hr over 30 Minutes Intravenous Every 24 hours 01/03/17 1414     01/03/17 1415  metroNIDAZOLE (FLAGYL) tablet 500 mg     500 mg Oral Every 8 hours 01/03/17 1414     01/03/17 1400  meropenem (MERREM) 1 g in sodium chloride 0.9 % 100 mL IVPB  Status:  Discontinued     1 g 200 mL/hr over 30 Minutes Intravenous Every 8 hours 01/03/17 1030 01/03/17 1414   12/31/16 1800  meropenem (MERREM) 1 g in sodium chloride 0.9 % 100 mL IVPB  Status:  Discontinued     1 g 200 mL/hr over 30 Minutes Intravenous Every 12 hours 12/31/16 1204 01/03/17 1030   12/31/16 1630  vancomycin (VANCOCIN) 1,250 mg in sodium chloride 0.9 % 250 mL IVPB  Status:   Discontinued     1,250 mg 166.7 mL/hr over 90 Minutes Intravenous Every 24 hours 12/31/16 1213 01/03/17 1414   12/31/16 1600  vancomycin (VANCOCIN) 1,250 mg in sodium chloride 0.9 % 250 mL IVPB  Status:  Discontinued     1,250 mg 166.7 mL/hr over 90 Minutes Intravenous Every 24 hours 12/31/16 1204 12/31/16 1213   12/31/16 1045  aztreonam (AZACTAM) 2 g in dextrose 5 % 50 mL IVPB     2 g 100 mL/hr over 30 Minutes Intravenous  Once 12/31/16 1022 12/31/16 1113   12/31/16 1030  metroNIDAZOLE (FLAGYL) IVPB 500 mg     500 mg 100 mL/hr over 60 Minutes Intravenous  Once 12/31/16 1022 12/31/16 1200   12/31/16 1030  vancomycin (VANCOCIN) IVPB 1000 mg/200 mL premix     1,000 mg 200 mL/hr over 60 Minutes Intravenous  Once 12/31/16 1022 12/31/16 1217      Medications   Scheduled Meds: . docusate sodium  100 mg Oral BID  . enoxaparin (LOVENOX) injection  40 mg Subcutaneous Q24H  . insulin aspart  0-15 Units Subcutaneous TID WC  . insulin aspart  0-5 Units Subcutaneous QHS  . insulin aspart  4 Units Subcutaneous TID WC  . insulin detemir  25 Units Subcutaneous QHS  . metroNIDAZOLE  500 mg Oral Q8H  . polyethylene glycol  17 g Oral Daily  . senna  1 tablet Oral Daily  . sodium chloride flush  10-40 mL Intracatheter Q12H   Continuous Infusions: . cefTRIAXone (ROCEPHIN)  IV 2 g (01/04/17 1600)   PRN Meds:.acetaminophen **OR** acetaminophen, albuterol, HYDROcodone-acetaminophen, ondansetron **OR** ondansetron (ZOFRAN) IV, oxyCODONE **OR** oxyCODONE, polyethylene glycol, promethazine, sodium chloride flush   Data Review:   Micro Results Recent Results (from the past 240 hour(s))  Blood culture (routine x 2)  Status: None   Collection Time: 12/31/16 10:20 AM  Result Value Ref Range Status   Specimen Description BLOOD LEFT ANTECUBITAL  Final   Special Requests   Final    BOTTLES DRAWN AEROBIC AND ANAEROBIC Blood Culture adequate volume   Culture NO GROWTH 5 DAYS  Final   Report Status  01/05/2017 FINAL  Final  Urine culture     Status: Abnormal   Collection Time: 12/31/16 10:22 AM  Result Value Ref Range Status   Specimen Description URINE, CLEAN CATCH  Final   Special Requests NONE  Final   Culture (A)  Final    <10,000 COLONIES/mL INSIGNIFICANT GROWTH Performed at [facility], [place] [address], [place], [place] [address]    Report Status 01/01/2017 FINAL  Final  Blood culture (routine x 2)     Status: None   Collection Time: 12/31/16 10:23 AM  Result Value Ref Range Status   Specimen Description BLOOD LEFT FEMORAL ARTERY  Final   Special Requests   Final    BOTTLES DRAWN AEROBIC AND ANAEROBIC Blood Culture adequate volume   Culture NO GROWTH 5 DAYS  Final   Report Status 01/05/2017 FINAL  Final  Surgical pcr screen     Status: None   Collection Time: 12/31/16  2:40 PM  Result Value Ref Range Status   MRSA, PCR NEGATIVE NEGATIVE Final   Staphylococcus aureus NEGATIVE NEGATIVE Final    Comment:        The Xpert SA Assay (FDA approved for NASAL specimens in patients over 54 years of age), is one component of a comprehensive surveillance program.  Test performance has been validated by [facility] for patients greater than or equal to 46 year old. It is not intended to diagnose infection nor to guide or monitor treatment.   Aerobic/Anaerobic Culture (surgical/deep wound)     Status: None (Preliminary result)   Collection Time: 12/31/16  4:30 PM  Result Value Ref Range Status   Specimen Description WOUND  Final   Special Requests NONE  Final   Gram Stain   Final    ABUNDANT WBC PRESENT, PREDOMINANTLY PMN RARE SQUAMOUS EPITHELIAL CELLS PRESENT ABUNDANT GRAM POSITIVE COCCI IN PAIRS MODERATE GRAM NEGATIVE RODS FEW GRAM POSITIVE RODS Performed at [facility], [place] [address] [place], [place] [address]    Culture   Final    MODERATE STREPTOCOCCUS GROUP G NO ANAEROBES ISOLATED; CULTURE IN PROGRESS FOR 5 DAYS    Report Status PENDING   Incomplete  CULTURE, BLOOD (ROUTINE X 2) w Reflex to ID Panel     Status: None (Preliminary result)   Collection Time: 01/02/17  1:15 PM  Result Value Ref Range Status   Specimen Description BLOOD RIGHT ANTECUBITAL  Final   Special Requests   Final    BOTTLES DRAWN AEROBIC AND ANAEROBIC Blood Culture adequate volume   Culture NO GROWTH 3 DAYS  Final   Report Status PENDING  Incomplete  CULTURE, BLOOD (ROUTINE X 2) w Reflex to ID Panel     Status: None (Preliminary result)   Collection Time: 01/02/17  1:15 PM  Result Value Ref Range Status   Specimen Description BLOOD BLOOD RIGHT FOREARM  Final   Special Requests   Final    BOTTLES DRAWN AEROBIC AND ANAEROBIC Blood Culture adequate volume   Culture NO GROWTH 3 DAYS  Final   Report Status PENDING  Incomplete    Radiology Reports Dg Foot Complete Left  Result Date: 12/31/2016 CLINICAL  DATA:  Diabetic ulcer on the bottom of the foot over the medial aspect of the great toe. EXAM: LEFT FOOT - COMPLETE 3+ VIEW COMPARISON:  Left foot series of December 24, 2016 FINDINGS: The bones are subjectively mildly osteopenic. No lytic or blastic bony lesions are observed. Subtle osteoarthritic changes are noted of the first MTP joint and of the fifth MTP joint. There are plantar and Achilles region calcaneal spurs. There is small amount of soft tissue gas in the interspace between the first and second toes. There is gas over the base of the proximal phalanx of the great toe. There is mild soft tissue swelling over the dorsum of the foot with arterial calcification noted. IMPRESSION: Findings compatible with a known foot ulcer over the base of the great toe. No bony changes of osteomyelitis are observed. There are mild osteoarthritic changes elsewhere and there are plantar and Achilles region calcaneal spurs. Electronically Signed   By: David  Martinique M.D.   On: 12/31/2016 10:41   Dg Foot Complete Left  Result Date: 12/24/2016 CLINICAL DATA:  Diabetic ulcer with  pain EXAM: LEFT FOOT - COMPLETE 3+ VIEW COMPARISON:  September 09, 2016 FINDINGS: Frontal, oblique, and lateral views were obtained. There is no evident fracture or dislocation. There is osteoarthritic change in the first MTP joint. There is also mild spurring in the dorsal midfoot. No erosive change or bony destruction. No soft tissue air or radiopaque foreign body evident. There are posterior and inferior calcaneal spurs. IMPRESSION: Areas of osteoarthritic change. Calcaneal spurs. No fracture or dislocation. No erosive change or bony destruction. No radiopaque foreign body. No soft tissue air to suggest abscess seen by radiography. Electronically Signed   By: Lowella Grip III M.D.   On: 12/24/2016 10:02     CBC  Recent Labs Lab 12/31/16 1020 01/01/17 0441 01/02/17 0401 01/03/17 0549 01/04/17 1121 01/05/17 0610  WBC 28.9* 26.9* 24.4* 15.5* 20.6* 16.4*  HGB 12.4 10.5* 10.5* 10.4* 10.8* 10.9*  HCT 36.0 31.0* 30.8* 31.4* 32.3* 31.7*  PLT 342 279 309 339 389 339  MCV 88.0 87.8 87.8 87.9 88.2 89.3  MCH 30.3 29.6 29.9 29.1 29.5 30.7  MCHC 34.4 33.8 34.0 33.0 33.4 34.4  RDW 12.5 12.4 12.5 12.5 12.6 12.5  LYMPHSABS 0.9*  --   --   --  1.1  --   MONOABS 1.7*  --   --   --  1.0*  --   EOSABS 0.0  --   --   --  0.5  --   BASOSABS 0.1  --   --   --  0.1  --     Chemistries   Recent Labs Lab 12/31/16 1020 01/01/17 0441 01/02/17 0401 01/03/17 0549  NA 133* 134* 138 135  K 3.8 4.6 3.5 3.4*  CL 95* 102 109 107  CO2 26 25 26 25   GLUCOSE 344* 361* 287* 99  BUN 31* 26* 24* 17  CREATININE 1.37* 0.81 0.96 0.66  CALCIUM 9.1 8.0* 8.0* 7.7*  AST 26  --   --   --   ALT 20  --   --   --   ALKPHOS 126  --   --   --   BILITOT 1.1  --   --   --    ------------------------------------------------------------------------------------------------------------------ estimated creatinine clearance is 91.5 mL/min (by C-G formula based on SCr of 0.66  mg/dL). ------------------------------------------------------------------------------------------------------------------ No results for input(s): HGBA1C in the last 72 hours. ------------------------------------------------------------------------------------------------------------------ No results for input(s): CHOL,  HDL, LDLCALC, TRIG, CHOLHDL, LDLDIRECT in the last 72 hours. ------------------------------------------------------------------------------------------------------------------ No results for input(s): TSH, T4TOTAL, T3FREE, THYROIDAB in the last 72 hours.  Invalid input(s): FREET3 ------------------------------------------------------------------------------------------------------------------ No results for input(s): VITAMINB12, FOLATE, FERRITIN, TIBC, IRON, RETICCTPCT in the last 72 hours.  Coagulation profile No results for input(s): INR, PROTIME in the last 168 hours.  No results for input(s): DDIMER in the last 72 hours.  Cardiac Enzymes No results for input(s): CKMB, TROPONINI, MYOGLOBIN in the last 168 hours.  Invalid input(s): CK ------------------------------------------------------------------------------------------------------------------ Invalid input(s): [place]  Patient with left foot infection  *Sepsis with hypotension blood pressure now improved  Continue current care  *Left foot diabetic ulcer with cellulitis. Possible abscess. Sepsis present on admission. Status post incision and drainage   Antibiotics as per ID will need prolonged antibiotics Plan for patient to have MRI later today Plan for patient to go to the operating room tomorrow  * Insulin dependent diabetes mellitus.  Continue insulin therapy Continue levemir blood sugars improved  *Hypotension due to sepsis now improved   * Acute kidney injury due to sepsis. Resolved with IV fluid  * DVT prophylaxis. Start patient on Lovenox for DVT prophylaxis    Code  Status Orders        Start     Ordered   12/31/16 1131  Full code  Continuous     12/31/16 1132    Code Status History    Date Active Date Inactive Code Status Order ID Comments User Context   This patient has a current code status but no historical code status.           Consults podiatruy   DVT Prophylaxis  Lovenox   Lab Results  Component Value Date   PLT 339 01/05/2017      Time Spent in minutes   80min  Greater than 50% of time spent in care coordination and counseling patient regarding the condition and plan of care.   Dustin Flock M.D on 01/05/2017 at 2:21 PM  Between 7am to 6pm - Pager - [phone]  After 6pm go to [email or web address] - password [email or web address] [place] [facility]   Office  [phone]
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
## 2017-01-05 NOTE — Progress Notes (Signed)
Report received from [facility] care resumed at 2300.

## 2017-01-06 ENCOUNTER — Encounter: Payer: Self-pay | Admitting: *Deleted

## 2017-01-06 ENCOUNTER — Inpatient Hospital Stay: Payer: Medicaid Other | Admitting: Certified Registered"

## 2017-01-06 ENCOUNTER — Encounter
Admission: EM | Disposition: A | Discharge status: Home / Self Care | Payer: Self-pay | Source: Home / Self Care | Attending: Internal Medicine

## 2017-01-06 HISTORY — PX: IRRIGATION AND DEBRIDEMENT FOOT: SHX6602

## 2017-01-06 HISTORY — PX: APPLICATION OF WOUND VAC: SHX5189

## 2017-01-06 LAB — GLUCOSE, CAPILLARY
GLUCOSE-CAPILLARY: 87 mg/dL (ref 65–99)
GLUCOSE-CAPILLARY: 115 mg/dL — AB (ref 65–99)
GLUCOSE-CAPILLARY: 99 mg/dL (ref 65–99)
GLUCOSE-CAPILLARY: 295 mg/dL — AB (ref 65–99)
Glucose-Capillary: 325 mg/dL — ABNORMAL HIGH (ref 65–99)

## 2017-01-06 LAB — AEROBIC/ANAEROBIC CULTURE W GRAM STAIN (SURGICAL/DEEP WOUND)

## 2017-01-06 LAB — AEROBIC/ANAEROBIC CULTURE (SURGICAL/DEEP WOUND)

## 2017-01-06 SURGERY — IRRIGATION AND DEBRIDEMENT FOOT
Anesthesia: General | Site: Foot | Laterality: Left | Wound class: Dirty or Infected

## 2017-01-06 MED ORDER — DEXTROSE 50 % IV SOLN
12.5000 g | Freq: Once | INTRAVENOUS | Status: AC
  Administered 2017-01-06: 12.5 g via INTRAVENOUS

## 2017-01-06 MED ORDER — DIPHENHYDRAMINE HCL 25 MG PO CAPS
25.0000 mg | ORAL_CAPSULE | Freq: Four times a day (QID) | ORAL | Status: DC | PRN
  Administered 2017-01-06 – 2017-01-07 (×5): 25 mg via ORAL
  Filled 2017-01-06 (×5): qty 1

## 2017-01-06 MED ORDER — PROMETHAZINE HCL 25 MG/ML IJ SOLN: 6.2500 mg | INTRAMUSCULAR | Status: DC | PRN

## 2017-01-06 MED ORDER — MORPHINE SULFATE (PF) 2 MG/ML IV SOLN
2.0000 mg | INTRAVENOUS | Status: DC | PRN
  Administered 2017-01-07: 2 mg via INTRAVENOUS
  Filled 2017-01-06: qty 1

## 2017-01-06 MED ORDER — VANCOMYCIN HCL IN DEXTROSE 1-5 GM/200ML-% IV SOLN
1000.0000 mg | Freq: Once | INTRAVENOUS | Status: AC
  Administered 2017-01-06: 1000 mg via INTRAVENOUS
  Filled 2017-01-06 (×2): qty 200

## 2017-01-06 MED ORDER — FENTANYL CITRATE (PF) 100 MCG/2ML IJ SOLN
INTRAMUSCULAR | Status: AC
  Filled 2017-01-06: qty 2

## 2017-01-06 MED ORDER — LIDOCAINE HCL (PF) 2 % IJ SOLN
INTRAMUSCULAR | Status: AC
  Filled 2017-01-06: qty 2

## 2017-01-06 MED ORDER — OXYCODONE HCL 5 MG PO TABS
15.0000 mg | ORAL_TABLET | Freq: Four times a day (QID) | ORAL | Status: DC | PRN
Start: 2017-01-06 — End: 2017-01-14
  Administered 2017-01-06 – 2017-01-13 (×16): 15 mg via ORAL
  Filled 2017-01-06 (×16): qty 3

## 2017-01-06 MED ORDER — LIDOCAINE HCL (PF) 1 % IJ SOLN
INTRAMUSCULAR | Status: AC
  Filled 2017-01-06: qty 30

## 2017-01-06 MED ORDER — DEXAMETHASONE SODIUM PHOSPHATE 10 MG/ML IJ SOLN
INTRAMUSCULAR | Status: DC | PRN
  Administered 2017-01-06: 5 mg via INTRAVENOUS

## 2017-01-06 MED ORDER — SODIUM CHLORIDE 0.9 % IV SOLN
INTRAVENOUS | Status: DC | PRN
  Administered 2017-01-06: 10:00:00 via INTRAVENOUS

## 2017-01-06 MED ORDER — GLYCOPYRROLATE 0.2 MG/ML IJ SOLN
INTRAMUSCULAR | Status: AC
  Filled 2017-01-06: qty 1

## 2017-01-06 MED ORDER — DEXAMETHASONE SODIUM PHOSPHATE 10 MG/ML IJ SOLN
INTRAMUSCULAR | Status: AC
  Filled 2017-01-06: qty 1

## 2017-01-06 MED ORDER — MIDODRINE HCL 5 MG PO TABS
5.0000 mg | ORAL_TABLET | Freq: Three times a day (TID) | ORAL | Status: DC
  Administered 2017-01-06 – 2017-01-13 (×20): 5 mg via ORAL
  Filled 2017-01-06 (×11): qty 1
  Filled 2017-01-06: qty 2
  Filled 2017-01-06 (×2): qty 1
  Filled 2017-01-06: qty 2
  Filled 2017-01-06 (×2): qty 1
  Filled 2017-01-06 (×2): qty 2
  Filled 2017-01-06 (×3): qty 1

## 2017-01-06 MED ORDER — DEXTROSE 50 % IV SOLN
INTRAVENOUS | Status: AC
  Administered 2017-01-06: 12.5 g via INTRAVENOUS
  Filled 2017-01-06: qty 50

## 2017-01-06 MED ORDER — FENTANYL CITRATE (PF) 100 MCG/2ML IJ SOLN
INTRAMUSCULAR | Status: DC | PRN
  Administered 2017-01-06 (×2): 25 ug via INTRAVENOUS
  Administered 2017-01-06: 50 ug via INTRAVENOUS

## 2017-01-06 MED ORDER — MIDAZOLAM HCL 2 MG/2ML IJ SOLN
INTRAMUSCULAR | Status: AC
  Filled 2017-01-06: qty 2

## 2017-01-06 MED ORDER — PREDNISONE 20 MG PO TABS
60.0000 mg | ORAL_TABLET | Freq: Every day | ORAL | Status: DC
  Administered 2017-01-07 – 2017-01-08 (×2): 60 mg via ORAL
  Filled 2017-01-06 (×3): qty 3

## 2017-01-06 MED ORDER — FENTANYL CITRATE (PF) 100 MCG/2ML IJ SOLN: 25.0000 ug | INTRAMUSCULAR | Status: DC | PRN

## 2017-01-06 MED ORDER — PREDNISONE 50 MG PO TABS
60.0000 mg | ORAL_TABLET | Freq: Every day | ORAL | Status: DC
  Filled 2017-01-06: qty 1

## 2017-01-06 MED ORDER — PROPOFOL 10 MG/ML IV BOLUS
INTRAVENOUS | Status: DC | PRN
  Administered 2017-01-06: 120 mg via INTRAVENOUS

## 2017-01-06 MED ORDER — VANCOMYCIN HCL IN DEXTROSE 1-5 GM/200ML-% IV SOLN
1000.0000 mg | Freq: Two times a day (BID) | INTRAVENOUS | Status: DC
  Administered 2017-01-06 – 2017-01-07 (×3): 1000 mg via INTRAVENOUS
  Filled 2017-01-06 (×5): qty 200

## 2017-01-06 MED ORDER — PREDNISOLONE 5 MG PO TABS: 60.0000 mg | ORAL_TABLET | Freq: Every day | ORAL | Status: DC

## 2017-01-06 MED ORDER — ONDANSETRON HCL 4 MG/2ML IJ SOLN
INTRAMUSCULAR | Status: DC | PRN
  Administered 2017-01-06: 4 mg via INTRAVENOUS

## 2017-01-06 MED ORDER — MIDAZOLAM HCL 5 MG/5ML IJ SOLN
INTRAMUSCULAR | Status: DC | PRN
  Administered 2017-01-06: 2 mg via INTRAVENOUS

## 2017-01-06 MED ORDER — ONDANSETRON HCL 4 MG/2ML IJ SOLN
INTRAMUSCULAR | Status: AC
  Filled 2017-01-06: qty 2

## 2017-01-06 MED ORDER — BUPIVACAINE HCL (PF) 0.5 % IJ SOLN
INTRAMUSCULAR | Status: AC
  Filled 2017-01-06: qty 30

## 2017-01-06 MED ORDER — SODIUM CHLORIDE 0.9 % IV BOLUS (SEPSIS)
1000.0000 mL | Freq: Once | INTRAVENOUS | Status: AC
  Administered 2017-01-06: 1000 mL via INTRAVENOUS

## 2017-01-06 MED ORDER — GLYCOPYRROLATE 0.2 MG/ML IJ SOLN
INTRAMUSCULAR | Status: DC | PRN
  Administered 2017-01-06: 0.2 mg via INTRAVENOUS

## 2017-01-06 MED ORDER — LIDOCAINE HCL (PF) 2 % IJ SOLN
INTRAMUSCULAR | Status: DC | PRN
  Administered 2017-01-06: 50 mg

## 2017-01-06 MED ORDER — PHENYLEPHRINE HCL 10 MG/ML IJ SOLN
INTRAMUSCULAR | Status: DC | PRN
  Administered 2017-01-06 (×6): 100 ug via INTRAVENOUS

## 2017-01-06 SURGICAL SUPPLY — 48 items
BAG COUNTER SPONGE EZ (MISCELLANEOUS) ×2 IMPLANT
BANDAGE ELASTIC 3 LF NS (GAUZE/BANDAGES/DRESSINGS) ×3 IMPLANT
BANDAGE ELASTIC 4 CLIP ST LF (GAUZE/BANDAGES/DRESSINGS) ×3 IMPLANT
BANDAGE ELASTIC 4 LF NS (GAUZE/BANDAGES/DRESSINGS) ×3 IMPLANT
BANDAGE STRETCH 3X4.1 STRL (GAUZE/BANDAGES/DRESSINGS) ×3 IMPLANT
BLADE SURG 15 STRL LF DISP TIS (BLADE) ×2 IMPLANT
BLADE SURG 15 STRL SS (BLADE) ×4
BNDG ESMARK 4X12 TAN STRL LF (GAUZE/BANDAGES/DRESSINGS) ×3 IMPLANT
BNDG GAUZE 4.5X4.1 6PLY STRL (MISCELLANEOUS) ×3 IMPLANT
CANISTER SUCT 1200ML W/VALVE (MISCELLANEOUS) ×3 IMPLANT
COUNTER SPONGE BAG EZ (MISCELLANEOUS) ×1
CUFF TOURN 18 STER (MISCELLANEOUS) IMPLANT
CUFF TOURN DUAL PL 12 NO SLV (MISCELLANEOUS) IMPLANT
DRAPE FLUOR MINI C-ARM 54X84 (DRAPES) ×3 IMPLANT
DRSG MEPITEL 4X7.2 (GAUZE/BANDAGES/DRESSINGS) ×3 IMPLANT
DURAPREP 26ML APPLICATOR (WOUND CARE) ×3 IMPLANT
ELECT REM PT RETURN 9FT ADLT (ELECTROSURGICAL) ×3
ELECTRODE REM PT RTRN 9FT ADLT (ELECTROSURGICAL) ×1 IMPLANT
GAUZE PETRO XEROFOAM 1X8 (MISCELLANEOUS) ×3 IMPLANT
GAUZE SPONGE 4X4 12PLY STRL (GAUZE/BANDAGES/DRESSINGS) ×3 IMPLANT
GLOVE BIO SURGEON STRL SZ8 (GLOVE) ×3 IMPLANT
GLOVE INDICATOR 8.0 STRL GRN (GLOVE) ×3 IMPLANT
GOWN STRL REUS W/ TWL LRG LVL3 (GOWN DISPOSABLE) ×1 IMPLANT
GOWN STRL REUS W/TWL LRG LVL3 (GOWN DISPOSABLE) ×2
KIT DRSG VAC SLVR GRANUFM (MISCELLANEOUS) ×3 IMPLANT
KIT RM TURNOVER STRD PROC AR (KITS) ×3 IMPLANT
LABEL OR SOLS (LABEL) ×3 IMPLANT
NEEDLE FILTER BLUNT 18X 1/2SAF (NEEDLE) ×2
NEEDLE FILTER BLUNT 18X1 1/2 (NEEDLE) ×1 IMPLANT
NEEDLE HYPO 25X1 1.5 SAFETY (NEEDLE) ×6 IMPLANT
NS IRRIG 500ML POUR BTL (IV SOLUTION) ×3 IMPLANT
PACK EXTREMITY ARMC (MISCELLANEOUS) ×3 IMPLANT
PAD ABD DERMACEA PRESS 5X9 (GAUZE/BANDAGES/DRESSINGS) ×3 IMPLANT
PULSAVAC PLUS IRRIG FAN TIP (DISPOSABLE) ×3
SOL .9 NS 3000ML IRR  AL (IV SOLUTION) ×4
SOL .9 NS 3000ML IRR UROMATIC (IV SOLUTION) ×2 IMPLANT
STOCKINETTE STRL 6IN 960660 (GAUZE/BANDAGES/DRESSINGS) ×3 IMPLANT
SUT ETHILON 3-0 FS-10 30 BLK (SUTURE) ×6
SUT ETHILON 4-0 (SUTURE) ×2
SUT ETHILON 4-0 FS2 18XMFL BLK (SUTURE) ×1
SUT VIC AB 3-0 SH 27 (SUTURE) ×2
SUT VIC AB 3-0 SH 27X BRD (SUTURE) ×1 IMPLANT
SUT VIC AB 4-0 FS2 27 (SUTURE) ×3 IMPLANT
SUTURE EHLN 3-0 FS-10 30 BLK (SUTURE) ×2 IMPLANT
SUTURE ETHLN 4-0 FS2 18XMF BLK (SUTURE) ×1 IMPLANT
SYR 3ML LL SCALE MARK (SYRINGE) ×6 IMPLANT
SYRINGE 10CC LL (SYRINGE) ×3 IMPLANT
TIP FAN IRRIG PULSAVAC PLUS (DISPOSABLE) ×1 IMPLANT

## 2017-01-06 NOTE — Op Note (Signed)
Operative note   Surgeon: Dr. Albertine Patricia, DPM.    Assistant: None    Preop diagnosis: Abscess with cellulitis left foot    Postop diagnosis: Same    Procedure:   1. I&D abscess cellulitis to the left foot with excisional debridement of infected skin soft tissue tendon and muscle          EBL: 25-30 cc    Anesthesia:general    Hemostasis: None    Specimen: Aerobic and anaerobic culture to the abscess    Complications: None patient underwent an I&D of severe infection to the plantar space on the left foot last week. She made some progress but has started to redevelop elevated white count fever and some lymphadenopathy. MRI yesterday showed a collection of fluid in the first and second intermetatarsal space consistent with a likely abscess to the region.    Operative indications: Continued infection left foot    Procedure:  Patient was brought into the OR and placed on the operating table in thesupine position. After anesthesia was obtained theleft lower extremity was prepped and draped in usual sterile fashion.  Operative Report: Patient was in the supine position and attention was directed to the dorsum of the foot where there was some beginning fluctuance to the dorsum of the first and second intermetatarsal space. A 5 cm linear incision made over this area and deepened with blunt dissection. The dissection carried down to the deeper fascia and significant purulent drainage was encountered. Using a blunt hemostat for probing the wound was probed plantarly and connected with the plantar wound. This was opened up and drained. The wound was also explored proximally and distally and laterally minimal was tracking was noted in these directions mostly gone from dorsal to plantar. MRI showed mostly collection of fluid was in between the metatarsals. A versa jet was used as setting of 1 to debride and clean up the area also to irrigate the region. Once this was completed the area was  compressed and no further purulence was extruded from the wounds either dorsally or plantarly. At this 200 cc of saline was used to pulse lavage both dorsally and plantarly for further irrigation and suction. Once this was completed the area was compressed again both dorsally and plantarly and no further purulence was noted. At this time because the MRI indicated some increased fluid in the third metatarsal phalangeal joint elected to incise the plantar capsule I was able to visualize the head and no ear drainage was noted to drain from the joint elected to be clean and metatarsal head appeared to be stable with no evidence of infection. No tracking was noted to the fourth metatarsal head and I elected to leave that alone at this point.  At this time 3-0 nylon was used to close up the proximal and distal portions of the plantar wound. Same thing occurred dorsally but the dorsal wound was packed from dorsal surface to the plantar surface with a saline soaked 4 x 4 gauze pad. Was left exposed dorsally. Next a silver granular foam a wound VAC sponge was fashioned and packed into the plantar wound. Wound VAC was placed on the area and hooked up and was seen to function nicely in 125 mmHg pressure in a continuous process. A sterile dressing was placed across the foot at this timeframe consisting of 4 x 4's ABDs pads conformation Kerlix.    Patient tolerated the procedure and anesthesia well.  Was transported from the OR to the PACU with  all vital signs stable and vascular status intact. To be discharged per routine protocol.  Will follow up in approximately 1 week in the outpatient clinic.

## 2017-01-06 NOTE — Progress Notes (Signed)
Pharmacy Antibiotic Note  Donna Fisher is a 51 y.o. female admitted on 12/31/2016 with wound infection.  Pharmacy has been consulted for vancomycin dosing.  Plan: Vancomycin 1000 mg dose now followed by 1000 mg IV q12h (6 hour stack) Goal VT 15-20 mcg/mL VT 6.23 @ 0930  Height: 5\' 5"  (165.1 cm) Weight: 191 lb (86.6 kg) IBW/kg (Calculated) : 57  Temp (24hrs), Avg:98.3 F (36.8 C), Min:97.5 F (36.4 C), Max:99.3 F (37.4 C)   Recent Labs Lab 12/31/16 1020 12/31/16 1435 01/01/17 0441 01/02/17 0401 01/03/17 0549 01/04/17 1121 01/05/17 0610  WBC 28.9*  --  26.9* 24.4* 15.5* 20.6* 16.4*  CREATININE 1.37*  --  0.81 0.96 0.66  --   --   LATICACIDVEN 2.1* 2.2*  --   --   --   --   --     Estimated Creatinine Clearance: 91.4 mL/min (by C-G formula based on SCr of 0.66 mg/dL).    Allergies  Allergen Reactions  . Penicillins Swelling    .Has patient had a PCN reaction causing immediate rash, facial/tongue/throat swelling, SOB or lightheadedness with hypotension: No Has patient had a PCN reaction causing severe rash involving mucus membranes or skin necrosis: No Has patient had a PCN reaction that required hospitalization: No Has patient had a PCN reaction occurring within the last 10 years: No If all of the above answers are "NO", then may proceed with Cephalosporin use.   . Codeine Itching    Thank you for allowing pharmacy to be a part of this patient's care.  Darylene Price [facility] 01/06/2017 3:02 PM

## 2017-01-06 NOTE — Anesthesia Postprocedure Evaluation (Signed)
Anesthesia Post Note  Patient: Donna Fisher  Procedure(s) Performed: Procedure(s) (LRB): IRRIGATION AND DEBRIDEMENT FOOT (Left) APPLICATION OF WOUND VAC (Left)  Patient location during evaluation: PACU Anesthesia Type: General Level of consciousness: awake and alert Pain management: pain level controlled Vital Signs Assessment: post-procedure vital signs reviewed and stable Respiratory status: spontaneous breathing, nonlabored ventilation, respiratory function stable and patient connected to nasal cannula oxygen Cardiovascular status: blood pressure returned to baseline and stable Postop Assessment: no signs of nausea or vomiting Anesthetic complications: no     Last Vitals:  Vitals:   01/06/17 1303 01/06/17 1415  BP: (!) 90/52 (!) 92/49  Pulse: 86   Resp: 16   Temp:      Last Pain:  Vitals:   01/06/17 1427  TempSrc:   PainSc: 8                  Martha Clan

## 2017-01-06 NOTE — Transfer of Care (Signed)
Immediate Anesthesia Transfer of Care Note  Patient: Donna Fisher  Procedure(s) Performed: Procedure(s): IRRIGATION AND DEBRIDEMENT FOOT (Left) APPLICATION OF WOUND VAC (Left)  Patient Location: PACU  Anesthesia Type:General  Level of Consciousness: sedated  Airway & Oxygen Therapy: Patient Spontanous Breathing and Patient connected to face mask oxygen  Post-op Assessment: Report given to RN and Post -op Vital signs reviewed and stable  Post vital signs: Reviewed  Last Vitals:  Vitals:   01/06/17 0858 01/06/17 1131  BP: (!) 97/42 (!) 90/53  Pulse: 92 84  Resp: 16 10  Temp: 36.8 C 36.8 C    Last Pain:  Vitals:   01/06/17 0858  TempSrc: Tympanic  PainSc: 8       Patients Stated Pain Goal: 2 (81/15/72 6203)  Complications: No apparent anesthesia complications

## 2017-01-06 NOTE — Anesthesia Procedure Notes (Signed)
Procedure Name: LMA Insertion Performed by: Nohealani Medinger Pre-anesthesia Checklist: Patient identified, Patient being monitored, Timeout performed, Emergency Drugs available and Suction available Patient Re-evaluated:Patient Re-evaluated prior to inductionOxygen Delivery Method: Circle system utilized Preoxygenation: Pre-oxygenation with 100% oxygen Intubation Type: IV induction Ventilation: Mask ventilation without difficulty LMA: LMA inserted LMA Size: 3.5 Tube type: Oral Number of attempts: 1 Placement Confirmation: positive ETCO2 and breath sounds checked- equal and bilateral Tube secured with: Tape Dental Injury: Teeth and Oropharynx as per pre-operative assessment        

## 2017-01-06 NOTE — Progress Notes (Signed)
PT Cancellation Note  Patient Details Name: Donna Fisher MRN: [id number] DOB: [birth year]   Cancelled Treatment:    Reason Eval/Treat Not Completed: Medical issues which prohibited therapy Pt with I&D this AM, had general anesthesia and per PT protocol will need new PT orders because of this if further PT intervention is indicated.  Will complete current orders.  Kreg Shropshire, DPT 01/06/2017, 1:15 PM

## 2017-01-06 NOTE — Anesthesia Post-op Follow-up Note (Cosign Needed)
Anesthesia QCDR form completed.        

## 2017-01-06 NOTE — Anesthesia Preprocedure Evaluation (Signed)
Anesthesia Evaluation  Patient identified by MRN, date of birth, ID band Patient awake    Reviewed: Allergy & Precautions, NPO status , Patient's Chart, lab work & pertinent test results  History of Anesthesia Complications Negative for: history of anesthetic complications (reports one instance of slow wake up after hysterectomy)  Airway Mallampati: III  TM Distance: >3 FB Neck ROM: Full    Dental  (+) Poor Dentition   Pulmonary neg sleep apnea, neg COPD, Current Smoker,    breath sounds clear to auscultation- rhonchi (-) wheezing      Cardiovascular Exercise Tolerance: Good (-) hypertension(-) CAD and (-) Past MI  Rhythm:Regular Rate:Normal - Systolic murmurs and - Diastolic murmurs    Neuro/Psych  Headaches, Anxiety    GI/Hepatic negative GI ROS, Neg liver ROS,   Endo/Other  diabetes, Insulin Dependent  Renal/GU negative Renal ROS     Musculoskeletal negative musculoskeletal ROS (+)   Abdominal (+) - obese,   Peds  Hematology negative hematology ROS (+)   Anesthesia Other Findings Past Medical History: No date: Anxiety state, unspecified No date: Diabetic neuropathy ([place]) No date: Genital herpes, unspecified No date: Headache(784.0) No date: Other and unspecified hyperlipidemia No date: Pain in joint, pelvic region and thigh No date: Type II or unspecified type diabetes mellitus * No date: Urinary tract infection, site not specified No date: Vaginitis and vulvovaginitis, unspecified No date: Vaginitis and vulvovaginitis, unspecified   Reproductive/Obstetrics                            Anesthesia Physical  Anesthesia Plan  ASA: III  Anesthesia Plan: General   Post-op Pain Management:    Induction: Intravenous  PONV Risk Score and Plan: 1 and Ondansetron, Dexamethasone, Propofol and Midazolam  Airway Management Planned: LMA  Additional Equipment:   Intra-op Plan:    Post-operative Plan:   Informed Consent: I have reviewed the patients History and Physical, chart, labs and discussed the procedure including the risks, benefits and alternatives for the proposed anesthesia with the patient or authorized representative who has indicated his/her understanding and acceptance.   Dental advisory given  Plan Discussed with: CRNA and Anesthesiologist  Anesthesia Plan Comments:         Anesthesia Quick Evaluation

## 2017-01-07 LAB — GLUCOSE, CAPILLARY
GLUCOSE-CAPILLARY: 314 mg/dL — AB (ref 65–99)
GLUCOSE-CAPILLARY: 264 mg/dL — AB (ref 65–99)
Glucose-Capillary: 251 mg/dL — ABNORMAL HIGH (ref 65–99)
Glucose-Capillary: 383 mg/dL — ABNORMAL HIGH (ref 65–99)

## 2017-01-07 LAB — CBC
HEMATOCRIT: 29.5 % — AB (ref 35.0–47.0)
HEMOGLOBIN: 10 g/dL — AB (ref 12.0–16.0)
MCH: 30.5 pg (ref 26.0–34.0)
MCHC: 33.8 g/dL (ref 32.0–36.0)
MCV: 90 fL (ref 80.0–100.0)
Platelets: 331 10*3/uL (ref 150–440)
RBC: 3.28 MIL/uL — ABNORMAL LOW (ref 3.80–5.20)
RDW: 12.6 % (ref 11.5–14.5)
WBC: 18.4 10*3/uL — ABNORMAL HIGH (ref 3.6–11.0)

## 2017-01-07 LAB — CULTURE, BLOOD (ROUTINE X 2)
CULTURE: NO GROWTH
Culture: NO GROWTH
SPECIAL REQUESTS: ADEQUATE
Special Requests: ADEQUATE

## 2017-01-07 LAB — CREATININE, SERUM
Creatinine, Ser: 0.78 mg/dL (ref 0.44–1.00)
GFR calc Af Amer: >60 mL/min (ref >60)

## 2017-01-07 MED ORDER — INSULIN ASPART 100 UNIT/ML ~~LOC~~ SOLN
6.0000 [IU] | Freq: Three times a day (TID) | SUBCUTANEOUS | Status: DC
  Administered 2017-01-07 – 2017-01-09 (×5): 6 [IU] via SUBCUTANEOUS
  Filled 2017-01-07 (×5): qty 1

## 2017-01-07 MED ORDER — HYDROXYZINE HCL 25 MG PO TABS
25.0000 mg | ORAL_TABLET | Freq: Three times a day (TID) | ORAL | Status: DC | PRN
  Administered 2017-01-08 – 2017-01-11 (×4): 25 mg via ORAL
  Filled 2017-01-07 (×5): qty 1

## 2017-01-07 MED ORDER — INSULIN DETEMIR 100 UNIT/ML ~~LOC~~ SOLN
30.0000 [IU] | Freq: Every day | SUBCUTANEOUS | Status: DC
  Administered 2017-01-07 – 2017-01-08 (×2): 30 [IU] via SUBCUTANEOUS
  Filled 2017-01-07 (×3): qty 0.3

## 2017-01-07 NOTE — Progress Notes (Signed)
1 Day Post-Op   Subjective/Chief Complaint: Patient seen. Complains of some pain in the foot but manageable.   Objective: Vital signs in last 24 hours: Temp:  [97.6 F (36.4 C)-97.8 F (36.6 C)] 97.6 F (36.4 C) (06/22 0950) Pulse Rate:  [76-86] 84 (06/22 0950) Resp:  [16-18] 18 (06/22 0116) BP: (90-99)/(49-60) 93/55 (06/22 0950) SpO2:  [93 %-97 %] 97 % (06/22 0950) Weight:  [87.3 kg (192 lb 6.4 oz)] 87.3 kg (192 lb 6.4 oz) (06/22 0548) Last BM Date: 12/31/16  Intake/Output from previous day: 06/21 0701 - 06/22 0700 In: 3329 [P.O.:720; I.V.:500; IV Piggyback:200] Out: -  Intake/Output this shift: Total I/O In: 440 [P.O.:240; IV Piggyback:200] Out: -   Some mild to moderate drainage and bleeding from the dorsal wound on the bandaging. Wound VAC plantarly is still intact. Only a small amount of drainage in the collection been. Does not appear to be purulent. Upon removal of the packing from the dorsal wound there does appear to still be some purulence on the packing from deep in the wound. Some continued erythema and edema in the forefoot.  Lab Results:   Recent Labs  01/05/17 0610 01/07/17 0500  WBC 16.4* 18.4*  HGB 10.9* 10.0*  HCT 31.7* 29.5*  PLT 339 331   BMET  Recent Labs  01/07/17 0500  CREATININE 0.78   PT/INR No results for input(s): LABPROT, INR in the last 72 hours. ABG No results for input(s): PHART, HCO3 in the last 72 hours.  Invalid input(s): PCO2, PO2  Studies/Results: Mr Foot Left Wo Contrast  Result Date: 01/06/2017 CLINICAL DATA:  Status post surgical debridement of a forefoot abscess. Surgery date 12/31/2016 EXAM: MRI OF THE LEFT FOOT WITHOUT CONTRAST TECHNIQUE: Multiplanar, multisequence MR imaging of the left foot was performed. No intravenous contrast was administered. COMPARISON:  Left foot radiographs 12/31/2016 FINDINGS: Large postoperative surgical defect/open wound involving the plantar aspect of the forefoot. No subcutaneous fluid  collection to suggest an abscess. There is diffuse and fairly severe myofasciitis involving the forefoot musculature. No definite findings for pyomyositis. There is a 2.9 x 2.7 x 2.0 cm fluid collection surrounding the mid distal aspect of the second metatarsal. This is worrisome for an abscess. No definite signal abnormality in the second metatarsal to suggest osteomyelitis. No joint effusion or destructive changes to suggest septic arthritis. STIR signal abnormality in the third and fourth metatarsal heads and proximal phalanges. This could be reactive or arthropathic changes. I do not see any obvious joint effusions to suggest septic arthritis but given the adjacent abscess certainly could not exclude septic arthritis and osteomyelitis at these joints. There is also an open wound/surgical debridement changes along the plantar aspect of the third metatarsal phalangeal joint. IMPRESSION: 1. Postoperative change with a large open wound/surgical debridement site involving the plantar aspect of the forefoot. 2. 2.9 x 2.7 x 2.0 cm fluid collections surrounding the mid distal aspect of the second metatarsal is worrisome for a persistent or recurrent abscess. 3. Signal abnormality in the third and fourth metatarsal heads and proximal phalanges. This could be reactive or arthropathic changes. Although I do not see any obvious joint effusion could not exclude the possibility of septic arthritis and osteomyelitis particularly with a large adjacent open wound. 4. Cellulitis and myofasciitis. Electronically Signed   By: Marijo Sanes M.D.   On: 01/06/2017 08:25    Anti-infectives: Anti-infectives    Start     Dose/Rate Route Frequency Ordered Stop   01/06/17 2200  vancomycin (  VANCOCIN) IVPB 1000 mg/200 mL premix     1,000 mg 200 mL/hr over 60 Minutes Intravenous Every 12 hours 01/06/17 1500     01/06/17 1530  vancomycin (VANCOCIN) IVPB 1000 mg/200 mL premix     1,000 mg 200 mL/hr over 60 Minutes Intravenous  Once  01/06/17 1442 01/06/17 1739   01/03/17 1500  cefTRIAXone (ROCEPHIN) 2 g in dextrose 5 % 50 mL IVPB  Status:  Discontinued     2 g 100 mL/hr over 30 Minutes Intravenous Every 24 hours 01/03/17 1414 01/06/17 1432   01/03/17 1415  metroNIDAZOLE (FLAGYL) tablet 500 mg     500 mg Oral Every 8 hours 01/03/17 1414     01/03/17 1400  meropenem (MERREM) 1 g in sodium chloride 0.9 % 100 mL IVPB  Status:  Discontinued     1 g 200 mL/hr over 30 Minutes Intravenous Every 8 hours 01/03/17 1030 01/03/17 1414   12/31/16 1800  meropenem (MERREM) 1 g in sodium chloride 0.9 % 100 mL IVPB  Status:  Discontinued     1 g 200 mL/hr over 30 Minutes Intravenous Every 12 hours 12/31/16 1204 01/03/17 1030   12/31/16 1630  vancomycin (VANCOCIN) 1,250 mg in sodium chloride 0.9 % 250 mL IVPB  Status:  Discontinued     1,250 mg 166.7 mL/hr over 90 Minutes Intravenous Every 24 hours 12/31/16 1213 01/03/17 1414   12/31/16 1600  vancomycin (VANCOCIN) 1,250 mg in sodium chloride 0.9 % 250 mL IVPB  Status:  Discontinued     1,250 mg 166.7 mL/hr over 90 Minutes Intravenous Every 24 hours 12/31/16 1204 12/31/16 1213   12/31/16 1045  aztreonam (AZACTAM) 2 g in dextrose 5 % 50 mL IVPB     2 g 100 mL/hr over 30 Minutes Intravenous  Once 12/31/16 1022 12/31/16 1113   12/31/16 1030  metroNIDAZOLE (FLAGYL) IVPB 500 mg     500 mg 100 mL/hr over 60 Minutes Intravenous  Once 12/31/16 1022 12/31/16 1200   12/31/16 1030  vancomycin (VANCOCIN) IVPB 1000 mg/200 mL premix     1,000 mg 200 mL/hr over 60 Minutes Intravenous  Once 12/31/16 1022 12/31/16 1217      Assessment/Plan: s/p Procedure(s): IRRIGATION AND DEBRIDEMENT FOOT (Left) APPLICATION OF WOUND VAC (Left) Assessment: Status post repeat I&D left foot, stable   Plan: The dorsal wound was repacked with sterile saline wet-to-dry gauze and rewrapped with a sterile dressing. 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  LOS: 7 days    [name] 01/07/2017

## 2017-01-07 NOTE — Care Management (Signed)
[facility] updated on patient progression.

## 2017-01-07 NOTE — Progress Notes (Signed)
Inpatient Diabetes Program Recommendations  AACE/ADA: New Consensus Statement on Inpatient Glycemic Control (2015)  Target Ranges:  Prepandial:   less than 140 mg/dL      Peak postprandial:   less than 180 mg/dL (1-2 hours)      Critically ill patients:  140 - 180 mg/dL   Lab Results  Component Value Date   GLUCAP 264 (H) 01/07/2017   HGBA1C 10.8 (H) 01/01/2017    Review of Glycemic Control:  Results for Donna Fisher, Donna Fisher (MRN [id number]) as of 01/07/2017 13:19  Ref. Range 01/06/2017 16:26 01/06/2017 21:24 01/07/2017 07:56 01/07/2017 11:32  Glucose-Capillary Latest Ref Range: 65 - 99 mg/dL 295 (H) 325 (H) 314 (H) 264 (H)   Note blood sugars increased.  Patient received Decadron 5 mg yesterday in surgery and now is on Prednisone 60 mg daily. Inpatient Diabetes Program Recommendations:   Consider increasing Lantus to 30 units q HS and increase Novolog meal coverage to 6 units tid with meals while patient is on steroids.   Thanks, Adah Perl, RN, BC-ADM Inpatient Diabetes Coordinator Pager [phone] (8a-5p)

## 2017-01-07 NOTE — Progress Notes (Signed)
[place] at [facility]                                                                                                                                                                                  Patient Demographics   Donna Fisher, is a 51 y.o. female, DOB - 1966-05-19, XVQ:[id number]  Admit date - 12/31/2016   Admitting Physician Hillary Bow, MD  Outpatient Primary MD for the patient is Patient, No Pcp Per   LOS - 7  Subjective: Patient complains of rash back and itching  Review of Systems:   CONSTITUTIONAL: Hypothermia yest. No fatigue, weakness. No weight gain, no weight loss.  EYES: No blurry or double vision.  ENT: No tinnitus. No postnasal drip. No redness of the oropharynx.  RESPIRATORY: No cough, no wheeze, no hemoptysis. No dyspnea.  CARDIOVASCULAR: No chest pain. No orthopnea. No palpitations. No syncope.  GASTROINTESTINAL: Positive nausea, no vomiting or diarrhea. No abdominal pain. No melena or hematochezia.  GENITOURINARY: No dysuria or hematuria.  ENDOCRINE: No polyuria or nocturia. No heat or cold intolerance.  HEMATOLOGY: No anemia. No bruising. No bleeding.  INTEGUMENTARY: Positive rashes. No lesions. Positive itching  MUSCULOSKELETAL: No arthritis. No swelling. No gout.  NEUROLOGIC: No numbness, tingling, or ataxia. No seizure-type activity.  PSYCHIATRIC: No anxiety. No insomnia. No ADD.    Vitals:   Vitals:   01/06/17 1539 01/07/17 0116 01/07/17 0548 01/07/17 0950  BP: (!) 99/53 93/60  (!) 93/55  Pulse: 86 76  84  Resp:  18    Temp:  97.8 F (36.6 C)  97.6 F (36.4 C)  TempSrc:  Oral  Oral  SpO2: 93% 93%  97%  Weight:   192 lb 6.4 oz (87.3 kg)   Height:        Wt Readings from Last 3 Encounters:  01/07/17 192 lb 6.4 oz (87.3 kg)  09/09/16 158 lb (71.7 kg)  12/25/15 160 lb (72.6 kg)     Intake/Output Summary (Last 24 hours) at 01/07/17 1500 Last data filed at 01/07/17 1351  Gross per 24 hour  Intake              1360 ml  Output                0 ml  Net             1360 ml    Physical Exam:   GENERAL: Pleasant-appearing in no apparent distress.  HEAD, EYES, EARS, NOSE AND THROAT: Atraumatic, normocephalic. Extraocular muscles are intact. Pupils equal and reactive to light. Sclerae anicteric. No conjunctival injection. No oro-pharyngeal erythema.  NECK: Supple. There is no jugular venous  distention. No bruits, no lymphadenopathy, no thyromegaly.  HEART: Regular rate and rhythm,. No murmurs, no rubs, no clicks.  LUNGS: Clear to auscultation bilaterally. No rales or rhonchi. No wheezes.  ABDOMEN: Soft, flat, nontender, nondistended. Has good bowel sounds. No hepatosplenomegaly appreciated.  EXTREMITIES: Left foot dressing in place positive edema of the extremity NEUROLOGIC: The patient is alert, awake, and oriented x3 with no focal motor or sensory deficits appreciated bilaterally.  SKIN:  Positive skin rash or skin rash Psych: Not anxious, depressed LN: No inguinal LN enlargement    Antibiotics   Anti-infectives    Start     Dose/Rate Route Frequency Ordered Stop   01/06/17 2200  vancomycin (VANCOCIN) IVPB 1000 mg/200 mL premix     1,000 mg 200 mL/hr over 60 Minutes Intravenous Every 12 hours 01/06/17 1500     01/06/17 1530  vancomycin (VANCOCIN) IVPB 1000 mg/200 mL premix     1,000 mg 200 mL/hr over 60 Minutes Intravenous  Once 01/06/17 1442 01/06/17 1739   01/03/17 1500  cefTRIAXone (ROCEPHIN) 2 g in dextrose 5 % 50 mL IVPB  Status:  Discontinued     2 g 100 mL/hr over 30 Minutes Intravenous Every 24 hours 01/03/17 1414 01/06/17 1432   01/03/17 1415  metroNIDAZOLE (FLAGYL) tablet 500 mg     500 mg Oral Every 8 hours 01/03/17 1414     01/03/17 1400  meropenem (MERREM) 1 g in sodium chloride 0.9 % 100 mL IVPB  Status:  Discontinued     1 g 200 mL/hr over 30 Minutes Intravenous Every 8 hours 01/03/17 1030 01/03/17 1414   12/31/16 1800  meropenem (MERREM) 1 g in sodium chloride 0.9 % 100  mL IVPB  Status:  Discontinued     1 g 200 mL/hr over 30 Minutes Intravenous Every 12 hours 12/31/16 1204 01/03/17 1030   12/31/16 1630  vancomycin (VANCOCIN) 1,250 mg in sodium chloride 0.9 % 250 mL IVPB  Status:  Discontinued     1,250 mg 166.7 mL/hr over 90 Minutes Intravenous Every 24 hours 12/31/16 1213 01/03/17 1414   12/31/16 1600  vancomycin (VANCOCIN) 1,250 mg in sodium chloride 0.9 % 250 mL IVPB  Status:  Discontinued     1,250 mg 166.7 mL/hr over 90 Minutes Intravenous Every 24 hours 12/31/16 1204 12/31/16 1213   12/31/16 1045  aztreonam (AZACTAM) 2 g in dextrose 5 % 50 mL IVPB     2 g 100 mL/hr over 30 Minutes Intravenous  Once 12/31/16 1022 12/31/16 1113   12/31/16 1030  metroNIDAZOLE (FLAGYL) IVPB 500 mg     500 mg 100 mL/hr over 60 Minutes Intravenous  Once 12/31/16 1022 12/31/16 1200   12/31/16 1030  vancomycin (VANCOCIN) IVPB 1000 mg/200 mL premix     1,000 mg 200 mL/hr over 60 Minutes Intravenous  Once 12/31/16 1022 12/31/16 1217      Medications   Scheduled Meds: . docusate sodium  100 mg Oral BID  . enoxaparin (LOVENOX) injection  40 mg Subcutaneous Q24H  . insulin aspart  0-15 Units Subcutaneous TID WC  . insulin aspart  0-5 Units Subcutaneous QHS  . insulin aspart  6 Units Subcutaneous TID WC  . insulin detemir  30 Units Subcutaneous QHS  . metroNIDAZOLE  500 mg Oral Q8H  . midodrine  5 mg Oral TID WC  . polyethylene glycol  17 g Oral Daily  . predniSONE  60 mg Oral Q breakfast  . senna  1 tablet Oral Daily  .  sodium chloride flush  10-40 mL Intracatheter Q12H   Continuous Infusions: . vancomycin Stopped (01/07/17 1140)   PRN Meds:.acetaminophen **OR** acetaminophen, albuterol, diphenhydrAMINE, hydrOXYzine, morphine injection, ondansetron **OR** ondansetron (ZOFRAN) IV, oxyCODONE, polyethylene glycol, sodium chloride flush   Data Review:   Micro Results Recent Results (from the past 240 hour(s))  Blood culture (routine x 2)     Status: None    Collection Time: 12/31/16 10:20 AM  Result Value Ref Range Status   Specimen Description BLOOD LEFT ANTECUBITAL  Final   Special Requests   Final    BOTTLES DRAWN AEROBIC AND ANAEROBIC Blood Culture adequate volume   Culture NO GROWTH 5 DAYS  Final   Report Status 01/05/2017 FINAL  Final  Urine culture     Status: Abnormal   Collection Time: 12/31/16 10:22 AM  Result Value Ref Range Status   Specimen Description URINE, CLEAN CATCH  Final   Special Requests NONE  Final   Culture (A)  Final    <10,000 COLONIES/mL INSIGNIFICANT GROWTH Performed at [facility], [address], [place], [place] [address]    Report Status 01/01/2017 FINAL  Final  Blood culture (routine x 2)     Status: None   Collection Time: 12/31/16 10:23 AM  Result Value Ref Range Status   Specimen Description BLOOD LEFT FEMORAL ARTERY  Final   Special Requests   Final    BOTTLES DRAWN AEROBIC AND ANAEROBIC Blood Culture adequate volume   Culture NO GROWTH 5 DAYS  Final   Report Status 01/05/2017 FINAL  Final  Surgical pcr screen     Status: None   Collection Time: 12/31/16  2:40 PM  Result Value Ref Range Status   MRSA, PCR NEGATIVE NEGATIVE Final   Staphylococcus aureus NEGATIVE NEGATIVE Final    Comment:        The Xpert SA Assay (FDA approved for NASAL specimens in patients over 65 years of age), is one component of a comprehensive surveillance program.  Test performance has been validated by [facility] for patients greater than or equal to 60 year old. It is not intended to diagnose infection nor to guide or monitor treatment.   Aerobic/Anaerobic Culture (surgical/deep wound)     Status: None   Collection Time: 12/31/16  4:30 PM  Result Value Ref Range Status   Specimen Description WOUND  Final   Special Requests NONE  Final   Gram Stain   Final    ABUNDANT WBC PRESENT, PREDOMINANTLY PMN RARE SQUAMOUS EPITHELIAL CELLS PRESENT ABUNDANT GRAM POSITIVE COCCI IN PAIRS MODERATE GRAM NEGATIVE  RODS FEW GRAM POSITIVE RODS    Culture   Final    MODERATE STREPTOCOCCUS GROUP G NO ANAEROBES ISOLATED Performed at [facility], [place] [address], [place], [place] [address]    Report Status 01/06/2017 FINAL  Final  CULTURE, BLOOD (ROUTINE X 2) w Reflex to ID Panel     Status: None   Collection Time: 01/02/17  1:15 PM  Result Value Ref Range Status   Specimen Description BLOOD RIGHT ANTECUBITAL  Final   Special Requests   Final    BOTTLES DRAWN AEROBIC AND ANAEROBIC Blood Culture adequate volume   Culture NO GROWTH 5 DAYS  Final   Report Status 01/07/2017 FINAL  Final  CULTURE, BLOOD (ROUTINE X 2) w Reflex to ID Panel     Status: None   Collection Time: 01/02/17  1:15 PM  Result Value Ref Range Status   Specimen Description BLOOD  BLOOD RIGHT FOREARM  Final   Special Requests   Final    BOTTLES DRAWN AEROBIC AND ANAEROBIC Blood Culture adequate volume   Culture NO GROWTH 5 DAYS  Final   Report Status 01/07/2017 FINAL  Final  Aerobic/Anaerobic Culture (surgical/deep wound)     Status: None (Preliminary result)   Collection Time: 01/06/17 11:06 AM  Result Value Ref Range Status   Specimen Description   Final    ABSCESS DEEP ABSCESS FROM 1ST AND 2ND METATARSAL REGION   Special Requests NONE  Final   Gram Stain   Final    ABUNDANT WBC PRESENT, PREDOMINANTLY PMN FEW GRAM POSITIVE COCCI IN PAIRS FEW GRAM POSITIVE RODS RARE GRAM NEGATIVE RODS    Culture   Final    CULTURE REINCUBATED FOR BETTER GROWTH Performed at [facility], [place] [address], [place], [place] [address]    Report Status PENDING  Incomplete    Radiology Reports Mr Foot Left Wo Contrast  Result Date: 01/06/2017 CLINICAL DATA:  Status post surgical debridement of a forefoot abscess. Surgery date 12/31/2016 EXAM: MRI OF THE LEFT FOOT WITHOUT CONTRAST TECHNIQUE: Multiplanar, multisequence MR imaging of the left foot was performed. No intravenous contrast was administered. COMPARISON:  Left foot  radiographs 12/31/2016 FINDINGS: Large postoperative surgical defect/open wound involving the plantar aspect of the forefoot. No subcutaneous fluid collection to suggest an abscess. There is diffuse and fairly severe myofasciitis involving the forefoot musculature. No definite findings for pyomyositis. There is a 2.9 x 2.7 x 2.0 cm fluid collection surrounding the mid distal aspect of the second metatarsal. This is worrisome for an abscess. No definite signal abnormality in the second metatarsal to suggest osteomyelitis. No joint effusion or destructive changes to suggest septic arthritis. STIR signal abnormality in the third and fourth metatarsal heads and proximal phalanges. This could be reactive or arthropathic changes. I do not see any obvious joint effusions to suggest septic arthritis but given the adjacent abscess certainly could not exclude septic arthritis and osteomyelitis at these joints. There is also an open wound/surgical debridement changes along the plantar aspect of the third metatarsal phalangeal joint. IMPRESSION: 1. Postoperative change with a large open wound/surgical debridement site involving the plantar aspect of the forefoot. 2. 2.9 x 2.7 x 2.0 cm fluid collections surrounding the mid distal aspect of the second metatarsal is worrisome for a persistent or recurrent abscess. 3. Signal abnormality in the third and fourth metatarsal heads and proximal phalanges. This could be reactive or arthropathic changes. Although I do not see any obvious joint effusion could not exclude the possibility of septic arthritis and osteomyelitis particularly with a large adjacent open wound. 4. Cellulitis and myofasciitis. Electronically Signed   By: Marijo Sanes M.D.   On: 01/06/2017 08:25   Dg Foot Complete Left  Result Date: 12/31/2016 CLINICAL DATA:  Diabetic ulcer on the bottom of the foot over the medial aspect of the great toe. EXAM: LEFT FOOT - COMPLETE 3+ VIEW COMPARISON:  Left foot series of December 24, 2016 FINDINGS: The bones are subjectively mildly osteopenic. No lytic or blastic bony lesions are observed. Subtle osteoarthritic changes are noted of the first MTP joint and of the fifth MTP joint. There are plantar and Achilles region calcaneal spurs. There is small amount of soft tissue gas in the interspace between the first and second toes. There is gas over the base of the proximal phalanx of the great toe. There is mild soft tissue swelling over the dorsum  of the foot with arterial calcification noted. IMPRESSION: Findings compatible with a known foot ulcer over the base of the great toe. No bony changes of osteomyelitis are observed. There are mild osteoarthritic changes elsewhere and there are plantar and Achilles region calcaneal spurs. Electronically Signed   By: David  Martinique M.D.   On: 12/31/2016 10:41   Dg Foot Complete Left  Result Date: 12/24/2016 CLINICAL DATA:  Diabetic ulcer with pain EXAM: LEFT FOOT - COMPLETE 3+ VIEW COMPARISON:  September 09, 2016 FINDINGS: Frontal, oblique, and lateral views were obtained. There is no evident fracture or dislocation. There is osteoarthritic change in the first MTP joint. There is also mild spurring in the dorsal midfoot. No erosive change or bony destruction. No soft tissue air or radiopaque foreign body evident. There are posterior and inferior calcaneal spurs. IMPRESSION: Areas of osteoarthritic change. Calcaneal spurs. No fracture or dislocation. No erosive change or bony destruction. No radiopaque foreign body. No soft tissue air to suggest abscess seen by radiography. Electronically Signed   By: Lowella Grip III M.D.   On: 12/24/2016 10:02     CBC  Recent Labs Lab 01/02/17 0401 01/03/17 0549 01/04/17 1121 01/05/17 0610 01/07/17 0500  WBC 24.4* 15.5* 20.6* 16.4* 18.4*  HGB 10.5* 10.4* 10.8* 10.9* 10.0*  HCT 30.8* 31.4* 32.3* 31.7* 29.5*  PLT 309 339 389 339 331  MCV 87.8 87.9 88.2 89.3 90.0  MCH 29.9 29.1 29.5 30.7 30.5  MCHC  34.0 33.0 33.4 34.4 33.8  RDW 12.5 12.5 12.6 12.5 12.6  LYMPHSABS  --   --  1.1  --   --   MONOABS  --   --  1.0*  --   --   EOSABS  --   --  0.5  --   --   BASOSABS  --   --  0.1  --   --     Chemistries   Recent Labs Lab 01/01/17 0441 01/02/17 0401 01/03/17 0549 01/07/17 0500  NA 134* 138 135  --   K 4.6 3.5 3.4*  --   CL 102 109 107  --   CO2 25 26 25   --   GLUCOSE 361* 287* 99  --   BUN 26* 24* 17  --   CREATININE 0.81 0.96 0.66 0.78  CALCIUM 8.0* 8.0* 7.7*  --    ------------------------------------------------------------------------------------------------------------------ estimated creatinine clearance is 91.8 mL/min (by C-G formula based on SCr of 0.78 mg/dL). ------------------------------------------------------------------------------------------------------------------ No results for input(s): HGBA1C in the last 72 hours. ------------------------------------------------------------------------------------------------------------------ No results for input(s): CHOL, HDL, LDLCALC, TRIG, CHOLHDL, LDLDIRECT in the last 72 hours. ------------------------------------------------------------------------------------------------------------------ No results for input(s): TSH, T4TOTAL, T3FREE, THYROIDAB in the last 72 hours.  Invalid input(s): FREET3 ------------------------------------------------------------------------------------------------------------------ No results for input(s): VITAMINB12, FOLATE, FERRITIN, TIBC, IRON, RETICCTPCT in the last 72 hours.  Coagulation profile No results for input(s): INR, PROTIME in the last 168 hours.  No results for input(s): DDIMER in the last 72 hours.  Cardiac Enzymes No results for input(s): CKMB, TROPONINI, MYOGLOBIN in the last 168 hours.  Invalid input(s): CK ------------------------------------------------------------------------------------------------------------------ Invalid input(s): [place]  Patient with left foot infection  *Sepsis with hypotension blood pressure now improved  Due to low blood pressure yesterday after procedure had to be started on midrodrine  *Left foot diabetic ulcer with cellulitis. Possible abscess. Sepsis present on admission. Status post incision and drainage   Antibiotics as per ID will need prolonged antibiotics Status post surgery  * Insulin  dependent diabetes mellitus.  Continue insulin therapy Continue levemir blood sugars improved  *Hypotension due to sepsis currently stable  * Acute kidney injury due to sepsis. Resolved with IV fluid  *Allergic reaction to antibiotics currently on prednisone and Benadryl continues to have symptoms we'll add Atarax as needed  * DVT prophylaxis. Start patient on Lovenox for DVT prophylaxis    Code Status Orders        Start     Ordered   12/31/16 1131  Full code  Continuous     12/31/16 1132    Code Status History    Date Active Date Inactive Code Status Order ID Comments User Context   This patient has a current code status but no historical code status.           Consults podiatruy   DVT Prophylaxis  Lovenox   Lab Results  Component Value Date   PLT 331 01/07/2017      Time Spent in minutes   75min  Greater than 50% of time spent in care coordination and counseling patient regarding the condition and plan of care.   Dustin Flock M.D on 01/07/2017 at 3:00 PM  Between 7am to 6pm - Pager - [phone]  After 6pm go to [email or web address] - password [email or web address] [facility]   Office  [phone]

## 2017-01-07 NOTE — Care Management (Addendum)
NEED WOUND MEASUREMENTS FOR [facility].   [facility] notified of IV antibiotic orders in from Dr. Ola Spurr

## 2017-01-07 NOTE — Progress Notes (Signed)
Infectious Disease Long Term IV Antibiotic Orders  Diagnosis: Diabetic foot infection   Culture results 6/22 cx pending  6/15 Aerobic/Anaerobic Culture (surgical/deep wound) [[id number]] Collected: 12/31/16 1630  Order Status: Completed Specimen: Wound from Wound Updated: 01/06/17 1129   Specimen Description WOUND   Special Requests NONE   Gram Stain --   ABUNDANT WBC PRESENT, PREDOMINANTLY PMN  RARE SQUAMOUS EPITHELIAL CELLS PRESENT  ABUNDANT GRAM POSITIVE COCCI IN PAIRS  MODERATE GRAM NEGATIVE RODS  FEW GRAM POSITIVE RODS    Culture --   MODERATE STREPTOCOCCUS GROUP G  NO ANAEROBES ISOLATED  Performed at [facility], [address] [place], [place] [address]    Report Status 01/06/2017 FINAL     Allergies:  Allergies  Allergen Reactions  . Penicillins Swelling    .Has patient had a PCN reaction causing immediate rash, facial/tongue/throat swelling, SOB or lightheadedness with hypotension: No Has patient had a PCN reaction causing severe rash involving mucus membranes or skin necrosis: No Has patient had a PCN reaction that required hospitalization: No Has patient had a PCN reaction occurring within the last 10 years: No If all of the above answers are "NO", then may proceed with Cephalosporin use.   . Codeine Itching  . Ceftriaxone Rash   Lab Results  Component Value Date   ESRSEDRATE 55 (H) 11/02/2014   Lab Results  Component Value Date   CRP 12.1 (H) 01/04/2017   Lab Results  Component Value Date   CREATININE 0.78 01/07/2017   Lab Results  Component Value Date   WBC 18.4 (H) 01/07/2017   HGB 10.0 (L) 01/07/2017   HCT 29.5 (L) 01/07/2017   MCV 90.0 01/07/2017   PLT 331 01/07/2017    Discharge antibiotics Vancomycin       1000           mg  every    12          hours .     Goal vancomycin trough 15-20.    Pharmacy to adjust dosing based on levels  ORAL Ciprofloxacin 500 bid   Flagyl 500 mg q 8 hrs   PICC Care per protocol Labs weekly  while on IV antibiotics   FAX weekly labs to [phone]    CBC w diff   Comprehensive met panel Vancomycin Trough   CRP   Planned duration of antibiotics 4 weeks from 6/22  Stop date 7/13 Follow up clinic date approx 7/13      P , MD  

## 2017-01-07 NOTE — Progress Notes (Signed)
Pharmacy Antibiotic Note  Donna Fisher is a 51 y.o. female admitted on 12/31/2016 with wound infection.  Pharmacy has been consulted for vancomycin dosing.  Plan: Vancomycin 1000 mg dose now followed by 1000 mg IV q12h (6 hour stack) - will continue this dose based on current renal function. Goal VT 15-20 mcg/mL VT 6/23 @ 0930 before 4th dose of regimen  Height: 5\' 5"  (165.1 cm) Weight: 192 lb 6.4 oz (87.3 kg) IBW/kg (Calculated) : 57  Temp (24hrs), Avg:98.1 F (36.7 C), Min:97.5 F (36.4 C), Max:98.3 F (36.8 C)   Recent Labs Lab 12/31/16 1020 12/31/16 1435 01/01/17 0441 01/02/17 0401 01/03/17 0549 01/04/17 1121 01/05/17 0610 01/07/17 0500  WBC 28.9*  --  26.9* 24.4* 15.5* 20.6* 16.4* 18.4*  CREATININE 1.37*  --  0.81 0.96 0.66  --   --  0.78  LATICACIDVEN 2.1* 2.2*  --   --   --   --   --   --     Estimated Creatinine Clearance: 91.8 mL/min (by C-G formula based on SCr of 0.78 mg/dL).    Allergies  Allergen Reactions  . Penicillins Swelling    .Has patient had a PCN reaction causing immediate rash, facial/tongue/throat swelling, SOB or lightheadedness with hypotension: No Has patient had a PCN reaction causing severe rash involving mucus membranes or skin necrosis: No Has patient had a PCN reaction that required hospitalization: No Has patient had a PCN reaction occurring within the last 10 years: No If all of the above answers are "NO", then may proceed with Cephalosporin use.   . Codeine Itching  . Ceftriaxone Rash    Aztreonam/Metronidazole 6/15 x 1.  Meropenem 6/15 >> 6/18 Vancomycin 6/15 >> 6/18, 6/21 >> CTX 6/18 >> 6/21 Metronidazole 6/18 >>   6/15 BCx x2 NG final UCx insig growth 6/17 BCx x2 NG final 6/15 WCx from OR mod group G strep, no anaerobes - final 6/21 WCx from OR pending  Thank you for allowing pharmacy to be a part of this patient's care.  Rocky Morel 01/07/2017 8:28 AM

## 2017-01-07 NOTE — Progress Notes (Signed)
[place] INFECTIOUS DISEASE PROGRESS NOTE Date of Admission:  12/31/2016     ID: Donna Fisher is a 51 y.o. female with L foot DM abscess Active Problems:   Cellulitis of left foot   Subjective: Had repeat surgery 6/21 on dorsal abscess with findings of purulent drainage. Wound vac placed. . Had rash to ceftriaxone.   ROS  Eleven systems are reviewed and negative except per hpi  Medications:  Antibiotics Given (last 72 hours)    Date/Time Action Medication Dose Rate   01/04/17 1600 New Bag/Given   cefTRIAXone (ROCEPHIN) 2 g in dextrose 5 % 50 mL IVPB 2 g 100 mL/hr   01/04/17 2305 Given   metroNIDAZOLE (FLAGYL) tablet 500 mg 500 mg    01/05/17 0602 Given   metroNIDAZOLE (FLAGYL) tablet 500 mg 500 mg    01/05/17 1424 Given   metroNIDAZOLE (FLAGYL) tablet 500 mg 500 mg    01/05/17 1509 New Bag/Given   cefTRIAXone (ROCEPHIN) 2 g in dextrose 5 % 50 mL IVPB 2 g 100 mL/hr   01/05/17 2029 Given   metroNIDAZOLE (FLAGYL) tablet 500 mg 500 mg    01/06/17 0612 Given   metroNIDAZOLE (FLAGYL) tablet 500 mg 500 mg    01/06/17 1311 Given   metroNIDAZOLE (FLAGYL) tablet 500 mg 500 mg    01/06/17 1639 New Bag/Given   vancomycin (VANCOCIN) IVPB 1000 mg/200 mL premix 1,000 mg 200 mL/hr   01/06/17 2138 Given   metroNIDAZOLE (FLAGYL) tablet 500 mg 500 mg    01/06/17 2141 New Bag/Given   vancomycin (VANCOCIN) IVPB 1000 mg/200 mL premix 1,000 mg 200 mL/hr   01/07/17 0514 Given   metroNIDAZOLE (FLAGYL) tablet 500 mg 500 mg    01/07/17 1039 New Bag/Given   vancomycin (VANCOCIN) IVPB 1000 mg/200 mL premix 1,000 mg 200 mL/hr   01/07/17 1308 Given   metroNIDAZOLE (FLAGYL) tablet 500 mg 500 mg      . docusate sodium  100 mg Oral BID  . enoxaparin (LOVENOX) injection  40 mg Subcutaneous Q24H  . insulin aspart  0-15 Units Subcutaneous TID WC  . insulin aspart  0-5 Units Subcutaneous QHS  . insulin aspart  4 Units Subcutaneous TID WC  . insulin detemir  25 Units Subcutaneous QHS  .  metroNIDAZOLE  500 mg Oral Q8H  . midodrine  5 mg Oral TID WC  . polyethylene glycol  17 g Oral Daily  . predniSONE  60 mg Oral Q breakfast  . senna  1 tablet Oral Daily  . sodium chloride flush  10-40 mL Intracatheter Q12H    Objective: Vital signs in last 24 hours: Temp:  [97.6 F (36.4 C)-97.8 F (36.6 C)] 97.6 F (36.4 C) (06/22 0950) Pulse Rate:  [76-86] 84 (06/22 0950) Resp:  [18] 18 (06/22 0116) BP: (93-99)/(53-60) 93/55 (06/22 0950) SpO2:  [93 %-97 %] 97 % (06/22 0950) Weight:  [87.3 kg (192 lb 6.4 oz)] 87.3 kg (192 lb 6.4 oz) (06/22 0548) Constitutional:  Obese, oriented to person, place, and time. appears well-developed and well-nourished. No distress.  HENT: [place]/AT, PERRLA, no scleral icterus Mouth/Throat: Oropharynx is clear and moist. No oropharyngeal exudate.  Cardiovascular: Normal rate, regular rhythm and normal heart sounds. Pulmonary/Chest: Effort normal and breath sounds normal. No respiratory distress.  has no wheezes.  Neck = supple, no nuchal rigidity Abdominal: Soft. Bowel sounds are normal.  exhibits no distension. There is no tenderness.  Lymphadenopathy: no cervical adenopathy. No axillary adenopathy Ext RLE with 1+ edema Vascular has  1+ DP pulse on LLE Neurological: alert and oriented to person, place, and time. PN BilLE Skin: L foot has plantar incisions with packing in place, decreased erythema, and mild-mod tenderness. Dorsum of foot hs ild swollen and mildly tender as well. Psychiatric: a normal mood and affect.  behavior is normal.   Lab Results  Recent Labs  01/05/17 0610 01/07/17 0500  WBC 16.4* 18.4*  HGB 10.9* 10.0*  HCT 31.7* 29.5*  CREATININE  --  0.78    Microbiology: Results for orders placed or performed during the hospital encounter of 12/31/16  Blood culture (routine x 2)     Status: None   Collection Time: 12/31/16 10:20 AM  Result Value Ref Range Status   Specimen Description BLOOD LEFT ANTECUBITAL  Final   Special  Requests   Final    BOTTLES DRAWN AEROBIC AND ANAEROBIC Blood Culture adequate volume   Culture NO GROWTH 5 DAYS  Final   Report Status 01/05/2017 FINAL  Final  Urine culture     Status: Abnormal   Collection Time: 12/31/16 10:22 AM  Result Value Ref Range Status   Specimen Description URINE, CLEAN CATCH  Final   Special Requests NONE  Final   Culture (A)  Final    <10,000 COLONIES/mL INSIGNIFICANT GROWTH Performed at [facility], [address] [place], [place] [address]    Report Status 01/01/2017 FINAL  Final  Blood culture (routine x 2)     Status: None   Collection Time: 12/31/16 10:23 AM  Result Value Ref Range Status   Specimen Description BLOOD LEFT FEMORAL ARTERY  Final   Special Requests   Final    BOTTLES DRAWN AEROBIC AND ANAEROBIC Blood Culture adequate volume   Culture NO GROWTH 5 DAYS  Final   Report Status 01/05/2017 FINAL  Final  Surgical pcr screen     Status: None   Collection Time: 12/31/16  2:40 PM  Result Value Ref Range Status   MRSA, PCR NEGATIVE NEGATIVE Final   Staphylococcus aureus NEGATIVE NEGATIVE Final    Comment:        The Xpert SA Assay (FDA approved for NASAL specimens in patients over 92 years of age), is one component of a comprehensive surveillance program.  Test performance has been validated by [facility] for patients greater than or equal to 69 year old. It is not intended to diagnose infection nor to guide or monitor treatment.   Aerobic/Anaerobic Culture (surgical/deep wound)     Status: None   Collection Time: 12/31/16  4:30 PM  Result Value Ref Range Status   Specimen Description WOUND  Final   Special Requests NONE  Final   Gram Stain   Final    ABUNDANT WBC PRESENT, PREDOMINANTLY PMN RARE SQUAMOUS EPITHELIAL CELLS PRESENT ABUNDANT GRAM POSITIVE COCCI IN PAIRS MODERATE GRAM NEGATIVE RODS FEW GRAM POSITIVE RODS    Culture   Final    MODERATE STREPTOCOCCUS GROUP G NO ANAEROBES ISOLATED Performed at [facility], [place] [address], [place], [place] [address]    Report Status 01/06/2017 FINAL  Final  CULTURE, BLOOD (ROUTINE X 2) w Reflex to ID Panel     Status: None   Collection Time: 01/02/17  1:15 PM  Result Value Ref Range Status   Specimen Description BLOOD RIGHT ANTECUBITAL  Final   Special Requests   Final    BOTTLES DRAWN AEROBIC AND ANAEROBIC Blood Culture adequate volume   Culture NO GROWTH 5 DAYS  Final  Report Status 01/07/2017 FINAL  Final  CULTURE, BLOOD (ROUTINE X 2) w Reflex to ID Panel     Status: None   Collection Time: 01/02/17  1:15 PM  Result Value Ref Range Status   Specimen Description BLOOD BLOOD RIGHT FOREARM  Final   Special Requests   Final    BOTTLES DRAWN AEROBIC AND ANAEROBIC Blood Culture adequate volume   Culture NO GROWTH 5 DAYS  Final   Report Status 01/07/2017 FINAL  Final  Aerobic/Anaerobic Culture (surgical/deep wound)     Status: None (Preliminary result)   Collection Time: 01/06/17 11:06 AM  Result Value Ref Range Status   Specimen Description   Final    ABSCESS DEEP ABSCESS FROM 1ST AND 2ND METATARSAL REGION   Special Requests NONE  Final   Gram Stain   Final    ABUNDANT WBC PRESENT, PREDOMINANTLY PMN FEW GRAM POSITIVE COCCI IN PAIRS FEW GRAM POSITIVE RODS RARE GRAM NEGATIVE RODS    Culture   Final    CULTURE REINCUBATED FOR BETTER GROWTH Performed at [facility], [place] [address], [place], [place] [address]    Report Status PENDING  Incomplete   Lab Results  Component Value Date   ESRSEDRATE 87 (H) 11/02/2014   Lab Results  Component Value Date   CRP 12.1 (H) 01/04/2017    Studies/Results: Mr Foot Left Wo Contrast  Result Date: 01/06/2017 CLINICAL DATA:  Status post surgical debridement of a forefoot abscess. Surgery date 12/31/2016 EXAM: MRI OF THE LEFT FOOT WITHOUT CONTRAST TECHNIQUE: Multiplanar, multisequence MR imaging of the left foot was performed. No intravenous contrast was administered. COMPARISON:  Left foot  radiographs 12/31/2016 FINDINGS: Large postoperative surgical defect/open wound involving the plantar aspect of the forefoot. No subcutaneous fluid collection to suggest an abscess. There is diffuse and fairly severe myofasciitis involving the forefoot musculature. No definite findings for pyomyositis. There is a 2.9 x 2.7 x 2.0 cm fluid collection surrounding the mid distal aspect of the second metatarsal. This is worrisome for an abscess. No definite signal abnormality in the second metatarsal to suggest osteomyelitis. No joint effusion or destructive changes to suggest septic arthritis. STIR signal abnormality in the third and fourth metatarsal heads and proximal phalanges. This could be reactive or arthropathic changes. I do not see any obvious joint effusions to suggest septic arthritis but given the adjacent abscess certainly could not exclude septic arthritis and osteomyelitis at these joints. There is also an open wound/surgical debridement changes along the plantar aspect of the third metatarsal phalangeal joint. IMPRESSION: 1. Postoperative change with a large open wound/surgical debridement site involving the plantar aspect of the forefoot. 2. 2.9 x 2.7 x 2.0 cm fluid collections surrounding the mid distal aspect of the second metatarsal is worrisome for a persistent or recurrent abscess. 3. Signal abnormality in the third and fourth metatarsal heads and proximal phalanges. This could be reactive or arthropathic changes. Although I do not see any obvious joint effusion could not exclude the possibility of septic arthritis and osteomyelitis particularly with a large adjacent open wound. 4. Cellulitis and myofasciitis. Electronically Signed   By: Marijo Sanes M.D.   On: 01/06/2017 08:25    Assessment/Plan: Donna Fisher is a 51 y.o. 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 Has wound vac in place. PCN allergic but  she cannot recall allergy.  She was on cipro prior to admission.  MRSA PCR negative. ESR 55, crp 12.1 The infection is quite severe and limb threatening.  She has a palpable pulse of the foot. Was a 1 ppd smoker but quit 2 weeks ago. Encouraged her to remain off tobacco to help wound heal.  June 22- had repeat surgery with finding of dorsal foot abscess. Had rash to ceftriaxone. .   Recommendations Would continue vanco IV but add cipro for gram negative coverage given mixed gram stain with findings of GNR as well as gram positives. Continue flagyl as well. Would rec 2-4 weeks treatment from last surgery (done 6/22) See abx order sheet Thank you very much for the consult. Will follow with you.  FITZGERALD, DAVID P   01/07/2017, 2:37 PM

## 2017-01-07 NOTE — Progress Notes (Signed)
[place] [facility]                                                                                                                                                                                  Patient Demographics   [place], is a 52 y.o. female, DOB - Jun 05, 1966, WUJ:[id number]  Admit date - 12/31/2016   Admitting Physician Hillary Bow, MD  Outpatient Primary MD for the patient is Patient, No Pcp Per   LOS - 7  Subjective: PShe went to the OR earlier today and had further excision and debridement of the infected skin and soft tissue   Review of Systems:   CONSTITUTIONAL: Hypothermia yest. No fatigue, weakness. No weight gain, no weight loss.  EYES: No blurry or double vision.  ENT: No tinnitus. No postnasal drip. No redness of the oropharynx.  RESPIRATORY: No cough, no wheeze, no hemoptysis. No dyspnea.  CARDIOVASCULAR: No chest pain. No orthopnea. No palpitations. No syncope.  GASTROINTESTINAL: Positive nausea, no vomiting or diarrhea. No abdominal pain. No melena or hematochezia.  GENITOURINARY: No dysuria or hematuria.  ENDOCRINE: No polyuria or nocturia. No heat or cold intolerance.  HEMATOLOGY: No anemia. No bruising. No bleeding.  INTEGUMENTARY: 40 infectious MUSCULOSKELETAL: No arthritis. No swelling. No gout.  NEUROLOGIC: No numbness, tingling, or ataxia. No seizure-type activity.  PSYCHIATRIC: No anxiety. No insomnia. No ADD.    Vitals:   Vitals:   01/06/17 1539 01/07/17 0116 01/07/17 0548 01/07/17 0950  BP: (!) 99/53 93/60  (!) 93/55  Pulse: 86 76  84  Resp:  18    Temp:  97.8 F (36.6 C)  97.6 F (36.4 C)  TempSrc:  Oral  Oral  SpO2: 93% 93%  97%  Weight:   192 lb 6.4 oz (87.3 kg)   Height:        Wt Readings from Last 3 Encounters:  01/07/17 192 lb 6.4 oz (87.3 kg)  09/09/16 158 lb (71.7 kg)  12/25/15 160 lb (72.6 kg)     Intake/Output Summary (Last 24 hours) at 01/07/17 1503 Last data filed at 01/07/17  1351  Gross per 24 hour  Intake             1360 ml  Output                0 ml  Net             1360 ml    Physical Exam:   GENERAL: Pleasant-appearing in no apparent distress.  HEAD, EYES, EARS, NOSE AND THROAT: Atraumatic, normocephalic. Extraocular muscles are intact. Pupils equal and reactive to light. Sclerae anicteric. No conjunctival injection. No oro-pharyngeal  erythema.  NECK: Supple. There is no jugular venous distention. No bruits, no lymphadenopathy, no thyromegaly.  HEART: Regular rate and rhythm,. No murmurs, no rubs, no clicks.  LUNGS: Clear to auscultation bilaterally. No rales or rhonchi. No wheezes.  ABDOMEN: Soft, flat, nontender, nondistended. Has good bowel sounds. No hepatosplenomegaly appreciated.  EXTREMITIES: Left foot dressing in place positive edema of the extremity NEUROLOGIC: The patient is alert, awake, and oriented x3 with no focal motor or sensory deficits appreciated bilaterally.  SKIN: Moist and warm with no rashes appreciated.  Psych: Not anxious, depressed LN: No inguinal LN enlargement    Antibiotics   Anti-infectives    Start     Dose/Rate Route Frequency Ordered Stop   01/06/17 2200  vancomycin (VANCOCIN) IVPB 1000 mg/200 mL premix     1,000 mg 200 mL/hr over 60 Minutes Intravenous Every 12 hours 01/06/17 1500     01/06/17 1530  vancomycin (VANCOCIN) IVPB 1000 mg/200 mL premix     1,000 mg 200 mL/hr over 60 Minutes Intravenous  Once 01/06/17 1442 01/06/17 1739   01/03/17 1500  cefTRIAXone (ROCEPHIN) 2 g in dextrose 5 % 50 mL IVPB  Status:  Discontinued     2 g 100 mL/hr over 30 Minutes Intravenous Every 24 hours 01/03/17 1414 01/06/17 1432   01/03/17 1415  metroNIDAZOLE (FLAGYL) tablet 500 mg     500 mg Oral Every 8 hours 01/03/17 1414     01/03/17 1400  meropenem (MERREM) 1 g in sodium chloride 0.9 % 100 mL IVPB  Status:  Discontinued     1 g 200 mL/hr over 30 Minutes Intravenous Every 8 hours 01/03/17 1030 01/03/17 1414   12/31/16 1800   meropenem (MERREM) 1 g in sodium chloride 0.9 % 100 mL IVPB  Status:  Discontinued     1 g 200 mL/hr over 30 Minutes Intravenous Every 12 hours 12/31/16 1204 01/03/17 1030   12/31/16 1630  vancomycin (VANCOCIN) 1,250 mg in sodium chloride 0.9 % 250 mL IVPB  Status:  Discontinued     1,250 mg 166.7 mL/hr over 90 Minutes Intravenous Every 24 hours 12/31/16 1213 01/03/17 1414   12/31/16 1600  vancomycin (VANCOCIN) 1,250 mg in sodium chloride 0.9 % 250 mL IVPB  Status:  Discontinued     1,250 mg 166.7 mL/hr over 90 Minutes Intravenous Every 24 hours 12/31/16 1204 12/31/16 1213   12/31/16 1045  aztreonam (AZACTAM) 2 g in dextrose 5 % 50 mL IVPB     2 g 100 mL/hr over 30 Minutes Intravenous  Once 12/31/16 1022 12/31/16 1113   12/31/16 1030  metroNIDAZOLE (FLAGYL) IVPB 500 mg     500 mg 100 mL/hr over 60 Minutes Intravenous  Once 12/31/16 1022 12/31/16 1200   12/31/16 1030  vancomycin (VANCOCIN) IVPB 1000 mg/200 mL premix     1,000 mg 200 mL/hr over 60 Minutes Intravenous  Once 12/31/16 1022 12/31/16 1217      Medications   Scheduled Meds: . docusate sodium  100 mg Oral BID  . enoxaparin (LOVENOX) injection  40 mg Subcutaneous Q24H  . insulin aspart  0-15 Units Subcutaneous TID WC  . insulin aspart  0-5 Units Subcutaneous QHS  . insulin aspart  6 Units Subcutaneous TID WC  . insulin detemir  30 Units Subcutaneous QHS  . metroNIDAZOLE  500 mg Oral Q8H  . midodrine  5 mg Oral TID WC  . polyethylene glycol  17 g Oral Daily  . predniSONE  60 mg Oral Q breakfast  .  senna  1 tablet Oral Daily  . sodium chloride flush  10-40 mL Intracatheter Q12H   Continuous Infusions: . vancomycin Stopped (01/07/17 1140)   PRN Meds:.acetaminophen **OR** acetaminophen, albuterol, diphenhydrAMINE, hydrOXYzine, morphine injection, ondansetron **OR** ondansetron (ZOFRAN) IV, oxyCODONE, polyethylene glycol, sodium chloride flush   Data Review:   Micro Results Recent Results (from the past 240 hour(s))   Blood culture (routine x 2)     Status: None   Collection Time: 12/31/16 10:20 AM  Result Value Ref Range Status   Specimen Description BLOOD LEFT ANTECUBITAL  Final   Special Requests   Final    BOTTLES DRAWN AEROBIC AND ANAEROBIC Blood Culture adequate volume   Culture NO GROWTH 5 DAYS  Final   Report Status 01/05/2017 FINAL  Final  Urine culture     Status: Abnormal   Collection Time: 12/31/16 10:22 AM  Result Value Ref Range Status   Specimen Description URINE, CLEAN CATCH  Final   Special Requests NONE  Final   Culture (A)  Final    <10,000 COLONIES/mL INSIGNIFICANT GROWTH Performed at [facility], [address], [place], [place] [address]    Report Status 01/01/2017 FINAL  Final  Blood culture (routine x 2)     Status: None   Collection Time: 12/31/16 10:23 AM  Result Value Ref Range Status   Specimen Description BLOOD LEFT FEMORAL ARTERY  Final   Special Requests   Final    BOTTLES DRAWN AEROBIC AND ANAEROBIC Blood Culture adequate volume   Culture NO GROWTH 5 DAYS  Final   Report Status 01/05/2017 FINAL  Final  Surgical pcr screen     Status: None   Collection Time: 12/31/16  2:40 PM  Result Value Ref Range Status   MRSA, PCR NEGATIVE NEGATIVE Final   Staphylococcus aureus NEGATIVE NEGATIVE Final    Comment:        The Xpert SA Assay (FDA approved for NASAL specimens in patients over 70 years of age), is one component of a comprehensive surveillance program.  Test performance has been validated by [facility] for patients greater than or equal to 1 year old. It is not intended to diagnose infection nor to guide or monitor treatment.   Aerobic/Anaerobic Culture (surgical/deep wound)     Status: None   Collection Time: 12/31/16  4:30 PM  Result Value Ref Range Status   Specimen Description WOUND  Final   Special Requests NONE  Final   Gram Stain   Final    ABUNDANT WBC PRESENT, PREDOMINANTLY PMN RARE SQUAMOUS EPITHELIAL CELLS PRESENT ABUNDANT GRAM  POSITIVE COCCI IN PAIRS MODERATE GRAM NEGATIVE RODS FEW GRAM POSITIVE RODS    Culture   Final    MODERATE STREPTOCOCCUS GROUP G NO ANAEROBES ISOLATED Performed at [facility], [place] [address], [place], [place] [address]    Report Status 01/06/2017 FINAL  Final  CULTURE, BLOOD (ROUTINE X 2) w Reflex to ID Panel     Status: None   Collection Time: 01/02/17  1:15 PM  Result Value Ref Range Status   Specimen Description BLOOD RIGHT ANTECUBITAL  Final   Special Requests   Final    BOTTLES DRAWN AEROBIC AND ANAEROBIC Blood Culture adequate volume   Culture NO GROWTH 5 DAYS  Final   Report Status 01/07/2017 FINAL  Final  CULTURE, BLOOD (ROUTINE X 2) w Reflex to ID Panel     Status: None   Collection Time: 01/02/17  1:15 PM  Result Value  Ref Range Status   Specimen Description BLOOD BLOOD RIGHT FOREARM  Final   Special Requests   Final    BOTTLES DRAWN AEROBIC AND ANAEROBIC Blood Culture adequate volume   Culture NO GROWTH 5 DAYS  Final   Report Status 01/07/2017 FINAL  Final  Aerobic/Anaerobic Culture (surgical/deep wound)     Status: None (Preliminary result)   Collection Time: 01/06/17 11:06 AM  Result Value Ref Range Status   Specimen Description   Final    ABSCESS DEEP ABSCESS FROM 1ST AND 2ND METATARSAL REGION   Special Requests NONE  Final   Gram Stain   Final    ABUNDANT WBC PRESENT, PREDOMINANTLY PMN FEW GRAM POSITIVE COCCI IN PAIRS FEW GRAM POSITIVE RODS RARE GRAM NEGATIVE RODS    Culture   Final    CULTURE REINCUBATED FOR BETTER GROWTH Performed at [facility], [place] [address] [place], [place] [address]    Report Status PENDING  Incomplete    Radiology Reports Mr Foot Left Wo Contrast  Result Date: 01/06/2017 CLINICAL DATA:  Status post surgical debridement of a forefoot abscess. Surgery date 12/31/2016 EXAM: MRI OF THE LEFT FOOT WITHOUT CONTRAST TECHNIQUE: Multiplanar, multisequence MR imaging of the left foot was performed. No intravenous contrast was  administered. COMPARISON:  Left foot radiographs 12/31/2016 FINDINGS: Large postoperative surgical defect/open wound involving the plantar aspect of the forefoot. No subcutaneous fluid collection to suggest an abscess. There is diffuse and fairly severe myofasciitis involving the forefoot musculature. No definite findings for pyomyositis. There is a 2.9 x 2.7 x 2.0 cm fluid collection surrounding the mid distal aspect of the second metatarsal. This is worrisome for an abscess. No definite signal abnormality in the second metatarsal to suggest osteomyelitis. No joint effusion or destructive changes to suggest septic arthritis. STIR signal abnormality in the third and fourth metatarsal heads and proximal phalanges. This could be reactive or arthropathic changes. I do not see any obvious joint effusions to suggest septic arthritis but given the adjacent abscess certainly could not exclude septic arthritis and osteomyelitis at these joints. There is also an open wound/surgical debridement changes along the plantar aspect of the third metatarsal phalangeal joint. IMPRESSION: 1. Postoperative change with a large open wound/surgical debridement site involving the plantar aspect of the forefoot. 2. 2.9 x 2.7 x 2.0 cm fluid collections surrounding the mid distal aspect of the second metatarsal is worrisome for a persistent or recurrent abscess. 3. Signal abnormality in the third and fourth metatarsal heads and proximal phalanges. This could be reactive or arthropathic changes. Although I do not see any obvious joint effusion could not exclude the possibility of septic arthritis and osteomyelitis particularly with a large adjacent open wound. 4. Cellulitis and myofasciitis. Electronically Signed   By: Marijo Sanes M.D.   On: 01/06/2017 08:25   Dg Foot Complete Left  Result Date: 12/31/2016 CLINICAL DATA:  Diabetic ulcer on the bottom of the foot over the medial aspect of the great toe. EXAM: LEFT FOOT - COMPLETE 3+ VIEW  COMPARISON:  Left foot series of December 24, 2016 FINDINGS: The bones are subjectively mildly osteopenic. No lytic or blastic bony lesions are observed. Subtle osteoarthritic changes are noted of the first MTP joint and of the fifth MTP joint. There are plantar and Achilles region calcaneal spurs. There is small amount of soft tissue gas in the interspace between the first and second toes. There is gas over the base of the proximal phalanx of the great toe. There  is mild soft tissue swelling over the dorsum of the foot with arterial calcification noted. IMPRESSION: Findings compatible with a known foot ulcer over the base of the great toe. No bony changes of osteomyelitis are observed. There are mild osteoarthritic changes elsewhere and there are plantar and Achilles region calcaneal spurs. Electronically Signed   By: David  Martinique M.D.   On: 12/31/2016 10:41   Dg Foot Complete Left  Result Date: 12/24/2016 CLINICAL DATA:  Diabetic ulcer with pain EXAM: LEFT FOOT - COMPLETE 3+ VIEW COMPARISON:  September 09, 2016 FINDINGS: Frontal, oblique, and lateral views were obtained. There is no evident fracture or dislocation. There is osteoarthritic change in the first MTP joint. There is also mild spurring in the dorsal midfoot. No erosive change or bony destruction. No soft tissue air or radiopaque foreign body evident. There are posterior and inferior calcaneal spurs. IMPRESSION: Areas of osteoarthritic change. Calcaneal spurs. No fracture or dislocation. No erosive change or bony destruction. No radiopaque foreign body. No soft tissue air to suggest abscess seen by radiography. Electronically Signed   By: Lowella Grip III M.D.   On: 12/24/2016 10:02     CBC  Recent Labs Lab 01/02/17 0401 01/03/17 0549 01/04/17 1121 01/05/17 0610 01/07/17 0500  WBC 24.4* 15.5* 20.6* 16.4* 18.4*  HGB 10.5* 10.4* 10.8* 10.9* 10.0*  HCT 30.8* 31.4* 32.3* 31.7* 29.5*  PLT 309 339 389 339 331  MCV 87.8 87.9 88.2 89.3 90.0   MCH 29.9 29.1 29.5 30.7 30.5  MCHC 34.0 33.0 33.4 34.4 33.8  RDW 12.5 12.5 12.6 12.5 12.6  LYMPHSABS  --   --  1.1  --   --   MONOABS  --   --  1.0*  --   --   EOSABS  --   --  0.5  --   --   BASOSABS  --   --  0.1  --   --     Chemistries   Recent Labs Lab 01/01/17 0441 01/02/17 0401 01/03/17 0549 01/07/17 0500  NA 134* 138 135  --   K 4.6 3.5 3.4*  --   CL 102 109 107  --   CO2 25 26 25   --   GLUCOSE 361* 287* 99  --   BUN 26* 24* 17  --   CREATININE 0.81 0.96 0.66 0.78  CALCIUM 8.0* 8.0* 7.7*  --    ------------------------------------------------------------------------------------------------------------------ estimated creatinine clearance is 91.8 mL/min (by C-G formula based on SCr of 0.78 mg/dL). ------------------------------------------------------------------------------------------------------------------ No results for input(s): HGBA1C in the last 72 hours. ------------------------------------------------------------------------------------------------------------------ No results for input(s): CHOL, HDL, LDLCALC, TRIG, CHOLHDL, LDLDIRECT in the last 72 hours. ------------------------------------------------------------------------------------------------------------------ No results for input(s): TSH, T4TOTAL, T3FREE, THYROIDAB in the last 72 hours.  Invalid input(s): FREET3 ------------------------------------------------------------------------------------------------------------------ No results for input(s): VITAMINB12, FOLATE, FERRITIN, TIBC, IRON, RETICCTPCT in the last 72 hours.  Coagulation profile No results for input(s): INR, PROTIME in the last 168 hours.  No results for input(s): DDIMER in the last 72 hours.  Cardiac Enzymes No results for input(s): CKMB, TROPONINI, MYOGLOBIN in the last 168 hours.  Invalid input(s): CK ------------------------------------------------------------------------------------------------------------------ Invalid  input(s): [place]  Patient with left foot infection  *Sepsis with hypotension blood pressure Low again post procedure will give her a fluid bolus restart midrodine  Continue current care  *Left foot diabetic ulcer with cellulitis. Possible abscess. Sepsis present on admission. Underwent incision and drainage today again with wound VAC placement Antibiotics as per ID  will need prolonged antibiotics   * Insulin dependent diabetes mellitus.  Continue insulin therapy Continue levemir blood sugars improved  *Hypotension due to sepsis now improved   * Acute kidney injury due to sepsis. Resolved with IV fluid  * DVT prophylaxis. Start patient on Lovenox for DVT prophylaxis    Code Status Orders        Start     Ordered   12/31/16 1131  Full code  Continuous     12/31/16 1132    Code Status History    Date Active Date Inactive Code Status Order ID Comments User Context   This patient has a current code status but no historical code status.           Consults podiatruy   DVT Prophylaxis  Lovenox   Lab Results  Component Value Date   PLT 331 01/07/2017      Time Spent in minutes   74min  Greater than 50% of time spent in care coordination and counseling patient regarding the condition and plan of care.   Dustin Flock M.D on 01/07/2017 at 3:03 PM  Between 7am to 6pm - Pager - [phone]  After 6pm go to [email or web address] - password [email or web address] [facility]   Office  [phone]

## 2017-01-08 LAB — BASIC METABOLIC PANEL
ANION GAP: 6 (ref 5–15)
BUN: 16 mg/dL (ref 6–20)
CHLORIDE: 98 mmol/L — AB (ref 101–111)
CO2: 32 mmol/L (ref 22–32)
Calcium: 8.3 mg/dL — ABNORMAL LOW (ref 8.9–10.3)
Creatinine, Ser: 0.92 mg/dL (ref 0.44–1.00)
GFR calc Af Amer: >60 mL/min (ref >60)
GFR calc non Af Amer: >60 mL/min (ref >60)
Glucose, Bld: 334 mg/dL — ABNORMAL HIGH (ref 65–99)
POTASSIUM: 4.7 mmol/L (ref 3.5–5.1)
SODIUM: 136 mmol/L (ref 135–145)

## 2017-01-08 LAB — CBC
HCT: 31.4 % — ABNORMAL LOW (ref 35.0–47.0)
HEMOGLOBIN: 10.6 g/dL — AB (ref 12.0–16.0)
MCH: 30.2 pg (ref 26.0–34.0)
MCHC: 33.6 g/dL (ref 32.0–36.0)
MCV: 90 fL (ref 80.0–100.0)
Platelets: 393 10*3/uL (ref 150–440)
RBC: 3.49 MIL/uL — AB (ref 3.80–5.20)
RDW: 12.8 % (ref 11.5–14.5)
WBC: 20.9 10*3/uL — ABNORMAL HIGH (ref 3.6–11.0)

## 2017-01-08 LAB — GLUCOSE, CAPILLARY
GLUCOSE-CAPILLARY: 290 mg/dL — AB (ref 65–99)
GLUCOSE-CAPILLARY: 369 mg/dL — AB (ref 65–99)
Glucose-Capillary: 352 mg/dL — ABNORMAL HIGH (ref 65–99)
Glucose-Capillary: 396 mg/dL — ABNORMAL HIGH (ref 65–99)

## 2017-01-08 LAB — VANCOMYCIN, TROUGH: VANCOMYCIN TR: 16 ug/mL (ref 15–20)

## 2017-01-08 MED ORDER — CIPROFLOXACIN HCL 500 MG PO TABS
500.0000 mg | ORAL_TABLET | Freq: Two times a day (BID) | ORAL | Status: DC
  Administered 2017-01-08 – 2017-01-13 (×10): 500 mg via ORAL
  Filled 2017-01-08 (×11): qty 1

## 2017-01-08 MED ORDER — VANCOMYCIN HCL IN DEXTROSE 1-5 GM/200ML-% IV SOLN
1000.0000 mg | Freq: Two times a day (BID) | INTRAVENOUS | Status: DC
  Administered 2017-01-08 – 2017-01-12 (×10): 1000 mg via INTRAVENOUS
  Filled 2017-01-08 (×12): qty 200

## 2017-01-08 NOTE — Progress Notes (Signed)
Pharmacy Antibiotic Note  Donna Fisher is a 51 y.o. female admitted on 12/31/2016 with wound infection.  Pharmacy has been consulted for vancomycin dosing.  Plan: Continue vancomycin 1000 mg iv q 12 hours with trough at goal of 15-20 mcg/ml. Will check another trough in AM to confirm steady-state level at goal.   Will discuss adding ciprofloxacin to regimen as per ID recommendations with hospitalist.   Height: 5\' 5"  (165.1 cm) Weight: 191 lb 6.4 oz (86.8 kg) IBW/kg (Calculated) : 57  Temp (24hrs), Avg:98 F (36.7 C), Min:97.8 F (36.6 C), Max:98.2 F (36.8 C)   Recent Labs Lab 01/02/17 0401 01/03/17 0549 01/04/17 1121 01/05/17 0610 01/07/17 0500 01/08/17 0520 01/08/17 0857  WBC 24.4* 15.5* 20.6* 16.4* 18.4* 20.9*  --   CREATININE 0.96 0.66  --   --  0.78 0.92  --   VANCOTROUGH  --   --   --   --   --   --  16    Estimated Creatinine Clearance: 79.6 mL/min (by C-G formula based on SCr of 0.92 mg/dL).    Allergies  Allergen Reactions  . Penicillins Swelling    .Has patient had a PCN reaction causing immediate rash, facial/tongue/throat swelling, SOB or lightheadedness with hypotension: No Has patient had a PCN reaction causing severe rash involving mucus membranes or skin necrosis: No Has patient had a PCN reaction that required hospitalization: No Has patient had a PCN reaction occurring within the last 10 years: No If all of the above answers are "NO", then may proceed with Cephalosporin use.   . Codeine Itching  . Ceftriaxone Rash    Aztreonam/Metronidazole 6/15 x 1.  Meropenem 6/15 >> 6/18 Vancomycin 6/15 >> 6/18, 6/21 >> CTX 6/18 >> 6/21 Metronidazole 6/18 >>   6/15 BCx x2 NG final UCx insig growth 6/17 BCx x2 NG final 6/15 WCx from OR mod group G strep, no anaerobes - final 6/21 WCx from OR pending  Thank you for allowing pharmacy to be a part of this patient's care.  Ulice Dash D 01/08/2017 9:55 AM

## 2017-01-08 NOTE — Progress Notes (Signed)
2 Days Post-Op   Subjective/Chief Complaint: Patient seen. Still has some pain in the left foot.   Objective: Vital signs in last 24 hours: Temp:  [97.8 F (36.6 C)-98.2 F (36.8 C)] 97.9 F (36.6 C) (06/23 0732) Pulse Rate:  [77-80] 79 (06/23 0732) Resp:  [17-18] 18 (06/23 0732) BP: (107-115)/(60-67) 115/63 (06/23 0732) SpO2:  [95 %-96 %] 96 % (06/23 0732) Weight:  [86.8 kg (191 lb 6.4 oz)] 86.8 kg (191 lb 6.4 oz) (06/23 0500) Last BM Date: 12/31/16  Intake/Output from previous day: 06/22 0701 - 06/23 0700 In: 1120 [P.O.:720; IV Piggyback:400] Out: -  Intake/Output this shift: Total I/O In: 240 [P.O.:240] Out: -   Upon removal there is some continued reduction in edema and erythema in the dorsal left foot. Only mild to moderate drainage from the dorsal wound with some mild purulence on the packing on removal. Plantar wound has some mild dried and fresh blood with no signs of purulence. Wound VAC still intact with a good seal. Still concerning due to the fact that her white count is significantly elevated above 20,000  Lab Results:   Recent Labs  01/07/17 0500 01/08/17 0520  WBC 18.4* 20.9*  HGB 10.0* 10.6*  HCT 29.5* 31.4*  PLT 331 393   BMET  Recent Labs  01/07/17 0500 01/08/17 0520  NA  --  136  K  --  4.7  CL  --  98*  CO2  --  32  GLUCOSE  --  334*  BUN  --  16  CREATININE 0.78 0.92  CALCIUM  --  8.3*   PT/INR No results for input(s): LABPROT, INR in the last 72 hours. ABG No results for input(s): PHART, HCO3 in the last 72 hours.  Invalid input(s): PCO2, PO2  Studies/Results: No results found.  Anti-infectives: Anti-infectives    Start     Dose/Rate Route Frequency Ordered Stop   01/08/17 1000  vancomycin (VANCOCIN) IVPB 1000 mg/200 mL premix     1,000 mg 200 mL/hr over 60 Minutes Intravenous Every 12 hours 01/08/17 0844     01/06/17 2200  vancomycin (VANCOCIN) IVPB 1000 mg/200 mL premix  Status:  Discontinued     1,000 mg 200 mL/hr over  60 Minutes Intravenous Every 12 hours 01/06/17 1500 01/08/17 0844   01/06/17 1530  vancomycin (VANCOCIN) IVPB 1000 mg/200 mL premix     1,000 mg 200 mL/hr over 60 Minutes Intravenous  Once 01/06/17 1442 01/06/17 1739   01/03/17 1500  cefTRIAXone (ROCEPHIN) 2 g in dextrose 5 % 50 mL IVPB  Status:  Discontinued     2 g 100 mL/hr over 30 Minutes Intravenous Every 24 hours 01/03/17 1414 01/06/17 1432   01/03/17 1415  metroNIDAZOLE (FLAGYL) tablet 500 mg     500 mg Oral Every 8 hours 01/03/17 1414     01/03/17 1400  meropenem (MERREM) 1 g in sodium chloride 0.9 % 100 mL IVPB  Status:  Discontinued     1 g 200 mL/hr over 30 Minutes Intravenous Every 8 hours 01/03/17 1030 01/03/17 1414   12/31/16 1800  meropenem (MERREM) 1 g in sodium chloride 0.9 % 100 mL IVPB  Status:  Discontinued     1 g 200 mL/hr over 30 Minutes Intravenous Every 12 hours 12/31/16 1204 01/03/17 1030   12/31/16 1630  vancomycin (VANCOCIN) 1,250 mg in sodium chloride 0.9 % 250 mL IVPB  Status:  Discontinued     1,250 mg 166.7 mL/hr over 90 Minutes Intravenous Every  24 hours 12/31/16 1213 01/03/17 1414   12/31/16 1600  vancomycin (VANCOCIN) 1,250 mg in sodium chloride 0.9 % 250 mL IVPB  Status:  Discontinued     1,250 mg 166.7 mL/hr over 90 Minutes Intravenous Every 24 hours 12/31/16 1204 12/31/16 1213   12/31/16 1045  aztreonam (AZACTAM) 2 g in dextrose 5 % 50 mL IVPB     2 g 100 mL/hr over 30 Minutes Intravenous  Once 12/31/16 1022 12/31/16 1113   12/31/16 1030  metroNIDAZOLE (FLAGYL) IVPB 500 mg     500 mg 100 mL/hr over 60 Minutes Intravenous  Once 12/31/16 1022 12/31/16 1200   12/31/16 1030  vancomycin (VANCOCIN) IVPB 1000 mg/200 mL premix     1,000 mg 200 mL/hr over 60 Minutes Intravenous  Once 12/31/16 1022 12/31/16 1217      Assessment/Plan: s/p Procedure(s): IRRIGATION AND DEBRIDEMENT FOOT (Left) APPLICATION OF WOUND VAC (Left) Assessment: Status post multiple I&D's left foot, condition still guarded.    Plan: The dorsal and plantar wounds were repacked with sterile saline wet-to-dry gauze and covered with a sterile dressing. Wound VAC left intact. Plan for dressing change tomorrow with removal of the wound VAC and assessment of the plantar mid foot wound.  LOS: 8 days    [name] 01/08/2017

## 2017-01-08 NOTE — Progress Notes (Signed)
[place] at [facility]                                                                                                                                                                                  Patient Demographics   Falls, is a 51 y.o. female, DOB - 1966/05/28, DDU:[id number]  Admit date - 12/31/2016   Admitting Physician Hillary Bow, MD  Outpatient Primary MD for the patient is Patient, No Pcp Per   LOS - 8  Subjective: Patient complains of rash back and itching  Review of Systems:   CONSTITUTIONAL: Hypothermia yest. No fatigue, weakness. No weight gain, no weight loss.  EYES: No blurry or double vision.  ENT: No tinnitus. No postnasal drip. No redness of the oropharynx.  RESPIRATORY: No cough, no wheeze, no hemoptysis. No dyspnea.  CARDIOVASCULAR: No chest pain. No orthopnea. No palpitations. No syncope.  GASTROINTESTINAL: Positive nausea, no vomiting or diarrhea. No abdominal pain. No melena or hematochezia.  GENITOURINARY: No dysuria or hematuria.  ENDOCRINE: No polyuria or nocturia. No heat or cold intolerance.  HEMATOLOGY: No anemia. No bruising. No bleeding.  INTEGUMENTARY: Positive rashes. No lesions. Positive itching  MUSCULOSKELETAL: No arthritis. No swelling. No gout.  NEUROLOGIC: No numbness, tingling, or ataxia. No seizure-type activity.  PSYCHIATRIC: No anxiety. No insomnia. No ADD.    Vitals:   Vitals:   01/07/17 1644 01/07/17 2353 01/08/17 0500 01/08/17 0732  BP: 107/60 111/67  115/63  Pulse: 80 77  79  Resp: 17 17  18   Temp: 98.2 F (36.8 C) 97.8 F (36.6 C)  97.9 F (36.6 C)  TempSrc: Oral Oral  Oral  SpO2: 95% 95%  96%  Weight:   86.8 kg (191 lb 6.4 oz)   Height:        Wt Readings from Last 3 Encounters:  01/08/17 86.8 kg (191 lb 6.4 oz)  09/09/16 71.7 kg (158 lb)  12/25/15 72.6 kg (160 lb)     Intake/Output Summary (Last 24 hours) at 01/08/17 1323 Last data filed at 01/08/17 1208  Gross per 24  hour  Intake              920 ml  Output              800 ml  Net              120 ml    Physical Exam:   GENERAL: Pleasant-appearing in no apparent distress.  HEAD, EYES, EARS, NOSE AND THROAT: Atraumatic, normocephalic. Extraocular muscles are intact. Pupils equal and reactive to light. Sclerae anicteric. No conjunctival injection. No oro-pharyngeal erythema.  NECK: Supple. There is no jugular  venous distention. No bruits, no lymphadenopathy, no thyromegaly.  HEART: Regular rate and rhythm,. No murmurs, no rubs, no clicks.  LUNGS: Clear to auscultation bilaterally. No rales or rhonchi. No wheezes.  ABDOMEN: Soft, flat, nontender, nondistended. Has good bowel sounds. No hepatosplenomegaly appreciated.  EXTREMITIES: Left foot dressing and wound vac in place positive edema of the extremity NEUROLOGIC: The patient is alert, awake, and oriented x3 with no focal motor or sensory deficits appreciated bilaterally.  SKIN:  Positive skin rash or skin rash Psych: Not anxious, depressed LN: No inguinal LN enlargement    Antibiotics   Anti-infectives    Start     Dose/Rate Route Frequency Ordered Stop   01/08/17 1000  vancomycin (VANCOCIN) IVPB 1000 mg/200 mL premix     1,000 mg 200 mL/hr over 60 Minutes Intravenous Every 12 hours 01/08/17 0844     01/06/17 2200  vancomycin (VANCOCIN) IVPB 1000 mg/200 mL premix  Status:  Discontinued     1,000 mg 200 mL/hr over 60 Minutes Intravenous Every 12 hours 01/06/17 1500 01/08/17 0844   01/06/17 1530  vancomycin (VANCOCIN) IVPB 1000 mg/200 mL premix     1,000 mg 200 mL/hr over 60 Minutes Intravenous  Once 01/06/17 1442 01/06/17 1739   01/03/17 1500  cefTRIAXone (ROCEPHIN) 2 g in dextrose 5 % 50 mL IVPB  Status:  Discontinued     2 g 100 mL/hr over 30 Minutes Intravenous Every 24 hours 01/03/17 1414 01/06/17 1432   01/03/17 1415  metroNIDAZOLE (FLAGYL) tablet 500 mg     500 mg Oral Every 8 hours 01/03/17 1414     01/03/17 1400  meropenem (MERREM) 1  g in sodium chloride 0.9 % 100 mL IVPB  Status:  Discontinued     1 g 200 mL/hr over 30 Minutes Intravenous Every 8 hours 01/03/17 1030 01/03/17 1414   12/31/16 1800  meropenem (MERREM) 1 g in sodium chloride 0.9 % 100 mL IVPB  Status:  Discontinued     1 g 200 mL/hr over 30 Minutes Intravenous Every 12 hours 12/31/16 1204 01/03/17 1030   12/31/16 1630  vancomycin (VANCOCIN) 1,250 mg in sodium chloride 0.9 % 250 mL IVPB  Status:  Discontinued     1,250 mg 166.7 mL/hr over 90 Minutes Intravenous Every 24 hours 12/31/16 1213 01/03/17 1414   12/31/16 1600  vancomycin (VANCOCIN) 1,250 mg in sodium chloride 0.9 % 250 mL IVPB  Status:  Discontinued     1,250 mg 166.7 mL/hr over 90 Minutes Intravenous Every 24 hours 12/31/16 1204 12/31/16 1213   12/31/16 1045  aztreonam (AZACTAM) 2 g in dextrose 5 % 50 mL IVPB     2 g 100 mL/hr over 30 Minutes Intravenous  Once 12/31/16 1022 12/31/16 1113   12/31/16 1030  metroNIDAZOLE (FLAGYL) IVPB 500 mg     500 mg 100 mL/hr over 60 Minutes Intravenous  Once 12/31/16 1022 12/31/16 1200   12/31/16 1030  vancomycin (VANCOCIN) IVPB 1000 mg/200 mL premix     1,000 mg 200 mL/hr over 60 Minutes Intravenous  Once 12/31/16 1022 12/31/16 1217      Medications   Scheduled Meds: . docusate sodium  100 mg Oral BID  . enoxaparin (LOVENOX) injection  40 mg Subcutaneous Q24H  . insulin aspart  0-15 Units Subcutaneous TID WC  . insulin aspart  0-5 Units Subcutaneous QHS  . insulin aspart  6 Units Subcutaneous TID WC  . insulin detemir  30 Units Subcutaneous QHS  . metroNIDAZOLE  500 mg  Oral Q8H  . midodrine  5 mg Oral TID WC  . polyethylene glycol  17 g Oral Daily  . senna  1 tablet Oral Daily  . sodium chloride flush  10-40 mL Intracatheter Q12H   Continuous Infusions: . vancomycin Stopped (01/08/17 1054)   PRN Meds:.acetaminophen **OR** acetaminophen, albuterol, hydrOXYzine, morphine injection, ondansetron **OR** ondansetron (ZOFRAN) IV, oxyCODONE,  polyethylene glycol, sodium chloride flush   Data Review:   Micro Results Recent Results (from the past 240 hour(s))  Blood culture (routine x 2)     Status: None   Collection Time: 12/31/16 10:20 AM  Result Value Ref Range Status   Specimen Description BLOOD LEFT ANTECUBITAL  Final   Special Requests   Final    BOTTLES DRAWN AEROBIC AND ANAEROBIC Blood Culture adequate volume   Culture NO GROWTH 5 DAYS  Final   Report Status 01/05/2017 FINAL  Final  Urine culture     Status: Abnormal   Collection Time: 12/31/16 10:22 AM  Result Value Ref Range Status   Specimen Description URINE, CLEAN CATCH  Final   Special Requests NONE  Final   Culture (A)  Final    <10,000 COLONIES/mL INSIGNIFICANT GROWTH Performed at [facility], [address] [place], [place] [address]    Report Status 01/01/2017 FINAL  Final  Blood culture (routine x 2)     Status: None   Collection Time: 12/31/16 10:23 AM  Result Value Ref Range Status   Specimen Description BLOOD LEFT FEMORAL ARTERY  Final   Special Requests   Final    BOTTLES DRAWN AEROBIC AND ANAEROBIC Blood Culture adequate volume   Culture NO GROWTH 5 DAYS  Final   Report Status 01/05/2017 FINAL  Final  Surgical pcr screen     Status: None   Collection Time: 12/31/16  2:40 PM  Result Value Ref Range Status   MRSA, PCR NEGATIVE NEGATIVE Final   Staphylococcus aureus NEGATIVE NEGATIVE Final    Comment:        The Xpert SA Assay (FDA approved for NASAL specimens in patients over 68 years of age), is one component of a comprehensive surveillance program.  Test performance has been validated by [facility] for patients greater than or equal to 80 year old. It is not intended to diagnose infection nor to guide or monitor treatment.   Aerobic/Anaerobic Culture (surgical/deep wound)     Status: None   Collection Time: 12/31/16  4:30 PM  Result Value Ref Range Status   Specimen Description WOUND  Final   Special Requests NONE   Final   Gram Stain   Final    ABUNDANT WBC PRESENT, PREDOMINANTLY PMN RARE SQUAMOUS EPITHELIAL CELLS PRESENT ABUNDANT GRAM POSITIVE COCCI IN PAIRS MODERATE GRAM NEGATIVE RODS FEW GRAM POSITIVE RODS    Culture   Final    MODERATE STREPTOCOCCUS GROUP G NO ANAEROBES ISOLATED Performed at [facility], [place] [address] [place], [place] [address]    Report Status 01/06/2017 FINAL  Final  CULTURE, BLOOD (ROUTINE X 2) w Reflex to ID Panel     Status: None   Collection Time: 01/02/17  1:15 PM  Result Value Ref Range Status   Specimen Description BLOOD RIGHT ANTECUBITAL  Final   Special Requests   Final    BOTTLES DRAWN AEROBIC AND ANAEROBIC Blood Culture adequate volume   Culture NO GROWTH 5 DAYS  Final   Report Status 01/07/2017 FINAL  Final  CULTURE, BLOOD (ROUTINE X 2) w  Reflex to ID Panel     Status: None   Collection Time: 01/02/17  1:15 PM  Result Value Ref Range Status   Specimen Description BLOOD BLOOD RIGHT FOREARM  Final   Special Requests   Final    BOTTLES DRAWN AEROBIC AND ANAEROBIC Blood Culture adequate volume   Culture NO GROWTH 5 DAYS  Final   Report Status 01/07/2017 FINAL  Final  Aerobic/Anaerobic Culture (surgical/deep wound)     Status: None (Preliminary result)   Collection Time: 01/06/17 11:06 AM  Result Value Ref Range Status   Specimen Description   Final    ABSCESS DEEP ABSCESS FROM 1ST AND 2ND METATARSAL REGION   Special Requests NONE  Final   Gram Stain   Final    ABUNDANT WBC PRESENT, PREDOMINANTLY PMN FEW GRAM POSITIVE COCCI IN PAIRS FEW GRAM POSITIVE RODS RARE GRAM NEGATIVE RODS    Culture   Final    CULTURE REINCUBATED FOR BETTER GROWTH Performed at [facility], [place] [address], [place], [place] [address]    Report Status PENDING  Incomplete    Radiology Reports Mr Foot Left Wo Contrast  Result Date: 01/06/2017 CLINICAL DATA:  Status post surgical debridement of a forefoot abscess. Surgery date 12/31/2016 EXAM: MRI OF THE LEFT  FOOT WITHOUT CONTRAST TECHNIQUE: Multiplanar, multisequence MR imaging of the left foot was performed. No intravenous contrast was administered. COMPARISON:  Left foot radiographs 12/31/2016 FINDINGS: Large postoperative surgical defect/open wound involving the plantar aspect of the forefoot. No subcutaneous fluid collection to suggest an abscess. There is diffuse and fairly severe myofasciitis involving the forefoot musculature. No definite findings for pyomyositis. There is a 2.9 x 2.7 x 2.0 cm fluid collection surrounding the mid distal aspect of the second metatarsal. This is worrisome for an abscess. No definite signal abnormality in the second metatarsal to suggest osteomyelitis. No joint effusion or destructive changes to suggest septic arthritis. STIR signal abnormality in the third and fourth metatarsal heads and proximal phalanges. This could be reactive or arthropathic changes. I do not see any obvious joint effusions to suggest septic arthritis but given the adjacent abscess certainly could not exclude septic arthritis and osteomyelitis at these joints. There is also an open wound/surgical debridement changes along the plantar aspect of the third metatarsal phalangeal joint. IMPRESSION: 1. Postoperative change with a large open wound/surgical debridement site involving the plantar aspect of the forefoot. 2. 2.9 x 2.7 x 2.0 cm fluid collections surrounding the mid distal aspect of the second metatarsal is worrisome for a persistent or recurrent abscess. 3. Signal abnormality in the third and fourth metatarsal heads and proximal phalanges. This could be reactive or arthropathic changes. Although I do not see any obvious joint effusion could not exclude the possibility of septic arthritis and osteomyelitis particularly with a large adjacent open wound. 4. Cellulitis and myofasciitis. Electronically Signed   By: Marijo Sanes M.D.   On: 01/06/2017 08:25   Dg Foot Complete Left  Result Date:  12/31/2016 CLINICAL DATA:  Diabetic ulcer on the bottom of the foot over the medial aspect of the great toe. EXAM: LEFT FOOT - COMPLETE 3+ VIEW COMPARISON:  Left foot series of December 24, 2016 FINDINGS: The bones are subjectively mildly osteopenic. No lytic or blastic bony lesions are observed. Subtle osteoarthritic changes are noted of the first MTP joint and of the fifth MTP joint. There are plantar and Achilles region calcaneal spurs. There is small amount of soft tissue gas in the interspace  between the first and second toes. There is gas over the base of the proximal phalanx of the great toe. There is mild soft tissue swelling over the dorsum of the foot with arterial calcification noted. IMPRESSION: Findings compatible with a known foot ulcer over the base of the great toe. No bony changes of osteomyelitis are observed. There are mild osteoarthritic changes elsewhere and there are plantar and Achilles region calcaneal spurs. Electronically Signed   By: David  Martinique M.D.   On: 12/31/2016 10:41   Dg Foot Complete Left  Result Date: 12/24/2016 CLINICAL DATA:  Diabetic ulcer with pain EXAM: LEFT FOOT - COMPLETE 3+ VIEW COMPARISON:  September 09, 2016 FINDINGS: Frontal, oblique, and lateral views were obtained. There is no evident fracture or dislocation. There is osteoarthritic change in the first MTP joint. There is also mild spurring in the dorsal midfoot. No erosive change or bony destruction. No soft tissue air or radiopaque foreign body evident. There are posterior and inferior calcaneal spurs. IMPRESSION: Areas of osteoarthritic change. Calcaneal spurs. No fracture or dislocation. No erosive change or bony destruction. No radiopaque foreign body. No soft tissue air to suggest abscess seen by radiography. Electronically Signed   By: Lowella Grip III M.D.   On: 12/24/2016 10:02     CBC  Recent Labs Lab 01/03/17 0549 01/04/17 1121 01/05/17 0610 01/07/17 0500 01/08/17 0520  WBC 15.5* 20.6* 16.4*  18.4* 20.9*  HGB 10.4* 10.8* 10.9* 10.0* 10.6*  HCT 31.4* 32.3* 31.7* 29.5* 31.4*  PLT 339 389 339 331 393  MCV 87.9 88.2 89.3 90.0 90.0  MCH 29.1 29.5 30.7 30.5 30.2  MCHC 33.0 33.4 34.4 33.8 33.6  RDW 12.5 12.6 12.5 12.6 12.8  LYMPHSABS  --  1.1  --   --   --   MONOABS  --  1.0*  --   --   --   EOSABS  --  0.5  --   --   --   BASOSABS  --  0.1  --   --   --     Chemistries   Recent Labs Lab 01/02/17 0401 01/03/17 0549 01/07/17 0500 01/08/17 0520  NA 138 135  --  136  K 3.5 3.4*  --  4.7  CL 109 107  --  98*  CO2 26 25  --  32  GLUCOSE 287* 99  --  334*  BUN 24* 17  --  16  CREATININE 0.96 0.66 0.78 0.92  CALCIUM 8.0* 7.7*  --  8.3*   ------------------------------------------------------------------------------------------------------------------ estimated creatinine clearance is 79.6 mL/min (by C-G formula based on SCr of 0.92 mg/dL). ------------------------------------------------------------------------------------------------------------------ No results for input(s): HGBA1C in the last 72 hours. ------------------------------------------------------------------------------------------------------------------ No results for input(s): CHOL, HDL, LDLCALC, TRIG, CHOLHDL, LDLDIRECT in the last 72 hours. ------------------------------------------------------------------------------------------------------------------ No results for input(s): TSH, T4TOTAL, T3FREE, THYROIDAB in the last 72 hours.  Invalid input(s): FREET3 ------------------------------------------------------------------------------------------------------------------ No results for input(s): VITAMINB12, FOLATE, FERRITIN, TIBC, IRON, RETICCTPCT in the last 72 hours.  Coagulation profile No results for input(s): INR, PROTIME in the last 168 hours.  No results for input(s): DDIMER in the last 72 hours.  Cardiac Enzymes No results for input(s): CKMB, TROPONINI, MYOGLOBIN in the last 168  hours.  Invalid input(s): CK ------------------------------------------------------------------------------------------------------------------ Invalid input(s): [place]  Patient with left foot infection  *Sepsis with hypotension blood pressure now improved  Due to low blood pressure after procedure had to be started on midrodrine  *Left foot diabetic ulcer with  cellulitis. Possible abscess. Sepsis present on admission. Status post incision and drainage   Antibiotics as per ID will need prolonged antibiotics Status post surgery  still have wound vac, need further decision by podiarty  * Insulin dependent diabetes mellitus.  Continue insulin therapy Continue levemir blood sugars improved  *Hypotension due to sepsis currently stable  * Acute kidney injury due to sepsis. Resolved with IV fluid  *Allergic reaction to antibiotics was on prednisone and Benadryl , Atarax as needed   stop now.  * DVT prophylaxis. Start patient on Lovenox for DVT prophylaxis    Code Status Orders        Start     Ordered   12/31/16 1131  Full code  Continuous     12/31/16 1132    Code Status History    Date Active Date Inactive Code Status Order ID Comments User Context   This patient has a current code status but no historical code status.      Consults podiatry   DVT Prophylaxis  Lovenox   Lab Results  Component Value Date   PLT 393 01/08/2017      Time Spent in minutes   40min  Greater than 50% of time spent in care coordination and counseling patient regarding the condition and plan of care.   Vaughan Basta M.D on 01/08/2017 at 1:23 PM  Between 7am to 6pm - Pager - [phone]  After 6pm go to [email or web address] - password [email or web address] [facility]   Office  [phone]

## 2017-01-09 ENCOUNTER — Encounter
Admission: EM | Disposition: A | Discharge status: Home / Self Care | Payer: Self-pay | Source: Home / Self Care | Attending: Internal Medicine

## 2017-01-09 ENCOUNTER — Inpatient Hospital Stay: Payer: Medicaid Other | Admitting: Anesthesiology

## 2017-01-09 HISTORY — PX: I & D EXTREMITY: SHX5045

## 2017-01-09 LAB — CBC WITH DIFFERENTIAL/PLATELET
BASOS PCT: 1 %
Basophils Absolute: 0.1 10*3/uL (ref 0–0.1)
Eosinophils Absolute: 0.6 10*3/uL (ref 0–0.7)
Eosinophils Relative: 3 %
HCT: 30.8 % — ABNORMAL LOW (ref 35.0–47.0)
HEMOGLOBIN: 10.3 g/dL — AB (ref 12.0–16.0)
LYMPHS ABS: 1.9 10*3/uL (ref 1.0–3.6)
LYMPHS PCT: 9 %
MCH: 30.3 pg (ref 26.0–34.0)
MCHC: 33.4 g/dL (ref 32.0–36.0)
MCV: 90.9 fL (ref 80.0–100.0)
MONOS PCT: 6 %
Monocytes Absolute: 1.3 10*3/uL — ABNORMAL HIGH (ref 0.2–0.9)
NEUTROS ABS: 18.3 10*3/uL — AB (ref 1.4–6.5)
NEUTROS PCT: 83 %
Platelets: 371 10*3/uL (ref 150–440)
RBC: 3.39 MIL/uL — ABNORMAL LOW (ref 3.80–5.20)
RDW: 12.6 % (ref 11.5–14.5)
WBC: 22.1 10*3/uL — ABNORMAL HIGH (ref 3.6–11.0)

## 2017-01-09 LAB — GLUCOSE, CAPILLARY
GLUCOSE-CAPILLARY: 284 mg/dL — AB (ref 65–99)
GLUCOSE-CAPILLARY: 123 mg/dL — AB (ref 65–99)
Glucose-Capillary: 213 mg/dL — ABNORMAL HIGH (ref 65–99)
Glucose-Capillary: 130 mg/dL — ABNORMAL HIGH (ref 65–99)
Glucose-Capillary: 246 mg/dL — ABNORMAL HIGH (ref 65–99)

## 2017-01-09 LAB — VANCOMYCIN, TROUGH: Vancomycin Tr: 17 ug/mL (ref 15–20)

## 2017-01-09 SURGERY — IRRIGATION AND DEBRIDEMENT EXTREMITY
Anesthesia: General | Laterality: Left

## 2017-01-09 MED ORDER — GENTAMICIN SULFATE 40 MG/ML IJ SOLN
INTRAMUSCULAR | Status: AC
  Filled 2017-01-09: qty 6

## 2017-01-09 MED ORDER — PROPOFOL 10 MG/ML IV BOLUS
INTRAVENOUS | Status: DC | PRN
  Administered 2017-01-09: 150 mg via INTRAVENOUS

## 2017-01-09 MED ORDER — BUPIVACAINE HCL (PF) 0.5 % IJ SOLN
INTRAMUSCULAR | Status: DC | PRN
  Administered 2017-01-09: 19 mL

## 2017-01-09 MED ORDER — INSULIN ASPART 100 UNIT/ML ~~LOC~~ SOLN
8.0000 [IU] | Freq: Three times a day (TID) | SUBCUTANEOUS | Status: DC
  Administered 2017-01-10 – 2017-01-11 (×5): 8 [IU] via SUBCUTANEOUS
  Filled 2017-01-09 (×5): qty 1

## 2017-01-09 MED ORDER — DEXAMETHASONE SODIUM PHOSPHATE 10 MG/ML IJ SOLN
INTRAMUSCULAR | Status: DC | PRN
  Administered 2017-01-09: 10 mg via INTRAVENOUS

## 2017-01-09 MED ORDER — FENTANYL CITRATE (PF) 100 MCG/2ML IJ SOLN
INTRAMUSCULAR | Status: AC
  Filled 2017-01-09: qty 2

## 2017-01-09 MED ORDER — OXYCODONE HCL 5 MG/5ML PO SOLN: 5.0000 mg | Freq: Once | ORAL | Status: DC | PRN

## 2017-01-09 MED ORDER — MIDAZOLAM HCL 2 MG/2ML IJ SOLN
INTRAMUSCULAR | Status: DC | PRN
  Administered 2017-01-09: 2 mg via INTRAVENOUS

## 2017-01-09 MED ORDER — EPHEDRINE SULFATE 50 MG/ML IJ SOLN
INTRAMUSCULAR | Status: DC | PRN
  Administered 2017-01-09: 10 mg via INTRAVENOUS

## 2017-01-09 MED ORDER — MIDAZOLAM HCL 2 MG/2ML IJ SOLN
INTRAMUSCULAR | Status: AC
  Filled 2017-01-09: qty 2

## 2017-01-09 MED ORDER — EPHEDRINE SULFATE 50 MG/ML IJ SOLN
INTRAMUSCULAR | Status: AC
  Filled 2017-01-09: qty 1

## 2017-01-09 MED ORDER — ONDANSETRON HCL 4 MG/2ML IJ SOLN
INTRAMUSCULAR | Status: DC | PRN
Start: 2017-01-09 — End: 2017-01-09
  Administered 2017-01-09: 4 mg via INTRAVENOUS

## 2017-01-09 MED ORDER — PHENYLEPHRINE HCL 10 MG/ML IJ SOLN
INTRAMUSCULAR | Status: DC | PRN
  Administered 2017-01-09: 100 ug via INTRAVENOUS

## 2017-01-09 MED ORDER — FENTANYL CITRATE (PF) 100 MCG/2ML IJ SOLN
25.0000 ug | INTRAMUSCULAR | Status: DC | PRN
Start: 2017-01-09 — End: 2017-01-09
  Administered 2017-01-09 (×2): 50 ug via INTRAVENOUS

## 2017-01-09 MED ORDER — OXYCODONE HCL 5 MG PO TABS: 5.0000 mg | ORAL_TABLET | Freq: Once | ORAL | Status: DC | PRN

## 2017-01-09 MED ORDER — MEPERIDINE HCL 50 MG/ML IJ SOLN: 6.2500 mg | INTRAMUSCULAR | Status: DC | PRN

## 2017-01-09 MED ORDER — VANCOMYCIN HCL 1000 MG IV SOLR
INTRAVENOUS | Status: AC
  Filled 2017-01-09: qty 1000

## 2017-01-09 MED ORDER — PROMETHAZINE HCL 25 MG/ML IJ SOLN: 6.2500 mg | INTRAMUSCULAR | Status: DC | PRN

## 2017-01-09 MED ORDER — PROPOFOL 10 MG/ML IV BOLUS
INTRAVENOUS | Status: AC
  Filled 2017-01-09: qty 20

## 2017-01-09 MED ORDER — BUPIVACAINE HCL (PF) 0.5 % IJ SOLN
INTRAMUSCULAR | Status: AC
  Filled 2017-01-09: qty 30

## 2017-01-09 MED ORDER — INSULIN DETEMIR 100 UNIT/ML ~~LOC~~ SOLN
35.0000 [IU] | Freq: Every day | SUBCUTANEOUS | Status: DC
  Administered 2017-01-09 – 2017-01-10 (×2): 35 [IU] via SUBCUTANEOUS
  Filled 2017-01-09 (×3): qty 0.35

## 2017-01-09 MED ORDER — LACTATED RINGERS IV SOLN
INTRAVENOUS | Status: DC | PRN
  Administered 2017-01-09: 18:00:00 via INTRAVENOUS

## 2017-01-09 MED ORDER — FENTANYL CITRATE (PF) 100 MCG/2ML IJ SOLN
INTRAMUSCULAR | Status: DC | PRN
  Administered 2017-01-09 (×3): 25 ug via INTRAVENOUS

## 2017-01-09 SURGICAL SUPPLY — 53 items
BANDAGE ELASTIC 4 LF NS (GAUZE/BANDAGES/DRESSINGS) ×3 IMPLANT
BLADE OSCILLATING/SAGITTAL (BLADE)
BLADE SURG 15 STRL LF DISP TIS (BLADE) ×1 IMPLANT
BLADE SURG 15 STRL SS (BLADE) ×2
BLADE SW THK.38XMED LNG THN (BLADE) IMPLANT
BNDG ESMARK 4X12 TAN STRL LF (GAUZE/BANDAGES/DRESSINGS) ×3 IMPLANT
BNDG GAUZE 4.5X4.1 6PLY STRL (MISCELLANEOUS) ×3 IMPLANT
CANISTER SUCT 1200ML W/VALVE (MISCELLANEOUS) ×3 IMPLANT
CANISTER SUCT 3000ML PPV (MISCELLANEOUS) ×6 IMPLANT
CUFF TOURN 18 STER (MISCELLANEOUS) ×3 IMPLANT
CUFF TOURN DUAL PL 12 NO SLV (MISCELLANEOUS) IMPLANT
DRAPE FLUOR MINI C-ARM 54X84 (DRAPES) IMPLANT
DRSG MEPITEL 4X7.2 (GAUZE/BANDAGES/DRESSINGS) ×3 IMPLANT
DURAPREP 26ML APPLICATOR (WOUND CARE) ×3 IMPLANT
ELECT REM PT RETURN 9FT ADLT (ELECTROSURGICAL) ×3
ELECTRODE REM PT RTRN 9FT ADLT (ELECTROSURGICAL) ×1 IMPLANT
GAUZE FLUFF 18X24 1PLY STRL (GAUZE/BANDAGES/DRESSINGS) ×3 IMPLANT
GAUZE PETRO XEROFOAM 1X8 (MISCELLANEOUS) ×3 IMPLANT
GAUZE SPONGE 4X4 12PLY STRL (GAUZE/BANDAGES/DRESSINGS) ×3 IMPLANT
GAUZE STRETCH 2X75IN STRL (MISCELLANEOUS) IMPLANT
GAUZE XEROFORM 4X4 STRL (GAUZE/BANDAGES/DRESSINGS) ×3 IMPLANT
GLOVE BIO SURGEON STRL SZ7.5 (GLOVE) ×15 IMPLANT
GLOVE INDICATOR 8.0 STRL GRN (GLOVE) ×3 IMPLANT
GOWN STRL REUS W/ TWL LRG LVL3 (GOWN DISPOSABLE) ×2 IMPLANT
GOWN STRL REUS W/TWL LRG LVL3 (GOWN DISPOSABLE) ×4
HANDPIECE VERSAJET DEBRIDEMENT (MISCELLANEOUS) ×3 IMPLANT
KIT STIMULAN RAPID CURE 5CC (Orthopedic Implant) ×3 IMPLANT
LABEL OR SOLS (LABEL) ×3 IMPLANT
NEEDLE FILTER BLUNT 18X 1/2SAF (NEEDLE) ×2
NEEDLE FILTER BLUNT 18X1 1/2 (NEEDLE) ×1 IMPLANT
NEEDLE HYPO 25X1 1.5 SAFETY (NEEDLE) ×3 IMPLANT
NS IRRIG 500ML POUR BTL (IV SOLUTION) ×3 IMPLANT
PACK EXTREMITY ARMC (MISCELLANEOUS) ×3 IMPLANT
PAD ABD DERMACEA PRESS 5X9 (GAUZE/BANDAGES/DRESSINGS) ×3 IMPLANT
PENCIL ELECTRO HAND CTR (MISCELLANEOUS) IMPLANT
PULSAVAC PLUS IRRIG FAN TIP (DISPOSABLE) ×3
RASP SM TEAR CROSS CUT (RASP) IMPLANT
SOL .9 NS 3000ML IRR  AL (IV SOLUTION) ×2
SOL .9 NS 3000ML IRR UROMATIC (IV SOLUTION) ×1 IMPLANT
SOL PREP PVP 2OZ (MISCELLANEOUS) ×3
SOLUTION PREP PVP 2OZ (MISCELLANEOUS) ×1 IMPLANT
STAPLER SKIN PROX 35W (STAPLE) ×3 IMPLANT
STOCKINETTE STRL 4IN 9604848 (GAUZE/BANDAGES/DRESSINGS) ×3 IMPLANT
STOCKINETTE STRL 6IN 960660 (GAUZE/BANDAGES/DRESSINGS) ×3 IMPLANT
SUT ETHILON 4-0 (SUTURE) ×2
SUT ETHILON 4-0 FS2 18XMFL BLK (SUTURE) ×1
SUT VIC AB 3-0 SH 27 (SUTURE) ×2
SUT VIC AB 3-0 SH 27X BRD (SUTURE) ×1 IMPLANT
SUT VIC AB 4-0 FS2 27 (SUTURE) ×3 IMPLANT
SUTURE ETHLN 4-0 FS2 18XMF BLK (SUTURE) ×1 IMPLANT
SYR 3ML LL SCALE MARK (SYRINGE) ×3 IMPLANT
SYRINGE 10CC LL (SYRINGE) ×6 IMPLANT
TIP FAN IRRIG PULSAVAC PLUS (DISPOSABLE) ×1 IMPLANT

## 2017-01-09 NOTE — Transfer of Care (Signed)
Immediate Anesthesia Transfer of Care Note  Patient: Donna Fisher  Procedure(s) Performed: Procedure(s): IRRIGATION AND DEBRIDEMENT EXTREMITY (Left)  Patient Location: PACU  Anesthesia Type:General  Level of Consciousness: sedated  Airway & Oxygen Therapy: Patient Spontanous Breathing and Patient connected to face mask oxygen  Post-op Assessment: Report given to RN and Post -op Vital signs reviewed and stable  Post vital signs: Reviewed and stable  Last Vitals:  Vitals:   01/09/17 1443 01/09/17 1916  BP: (!) 105/56 113/74  Pulse: 86 90  Resp: 18 10  Temp: 36.7 C 36.4 C    Last Pain:  Vitals:   01/09/17 1916  TempSrc:   PainSc: Asleep      Patients Stated Pain Goal: 2 (60/63/01 6010)  Complications: No apparent anesthesia complications

## 2017-01-09 NOTE — Progress Notes (Signed)
Donna Fisher at [facility]                                                                                                                                                                                  Donna Fisher Demographics   [name], is a 51 y.o. female, DOB - 1965/07/23, QMG:[id number]  Admit date - 12/31/2016   Admitting Physician Hillary Bow, MD  Outpatient Primary MD for the Donna Fisher is Donna Fisher, No Pcp Per   LOS - 9  Subjective: Donna Fisher complains Pain in her leg.  Review of Systems:   CONSTITUTIONAL: Hypothermia yest. No fatigue, weakness. No weight gain, no weight loss.  EYES: No blurry or double vision.  ENT: No tinnitus. No postnasal drip. No redness of the oropharynx.  RESPIRATORY: No cough, no wheeze, no hemoptysis. No dyspnea.  CARDIOVASCULAR: No chest pain. No orthopnea. No palpitations. No syncope.  GASTROINTESTINAL: Positive nausea, no vomiting or diarrhea. No abdominal pain. No melena or hematochezia.  GENITOURINARY: No dysuria or hematuria.  ENDOCRINE: No polyuria or nocturia. No heat or cold intolerance.  HEMATOLOGY: No anemia. No bruising. No bleeding.  INTEGUMENTARY: Positive rashes. No lesions. Positive itching  MUSCULOSKELETAL: No arthritis. No swelling. No gout.  NEUROLOGIC: No numbness, tingling, or ataxia. No seizure-type activity.  PSYCHIATRIC: No anxiety. No insomnia. No ADD.    Vitals:   Vitals:   01/08/17 1343 01/08/17 2339 01/09/17 0500 01/09/17 0753  BP: (!) 111/58 134/70  106/60  Pulse: 91 79  84  Resp: 18 16  16   Temp: 97.8 F (36.6 C) 97.9 F (36.6 C)  97.6 F (36.4 C)  TempSrc: Oral Oral  Oral  SpO2: 97% 93%  98%  Weight:   87.1 kg (192 lb 1.6 oz)   Height:        Wt Readings from Last 3 Encounters:  01/09/17 87.1 kg (192 lb 1.6 oz)  09/09/16 71.7 kg (158 lb)  12/25/15 72.6 kg (160 lb)     Intake/Output Summary (Last 24 hours) at 01/09/17 1241 Last data filed at 01/09/17 0519  Gross per 24 hour   Intake             1360 ml  Output              850 ml  Net              510 ml    Physical Exam:   GENERAL: Pleasant-appearing in no apparent distress.  HEAD, EYES, EARS, NOSE AND THROAT: Atraumatic, normocephalic. Extraocular muscles are intact. Pupils equal and reactive to light. Sclerae anicteric. No conjunctival injection. No oro-pharyngeal erythema.  NECK: Supple. There is no jugular venous  distention. No bruits, no lymphadenopathy, no thyromegaly.  HEART: Regular rate and rhythm,. No murmurs, no rubs, no clicks.  LUNGS: Clear to auscultation bilaterally. No rales or rhonchi. No wheezes.  ABDOMEN: Soft, flat, nontender, nondistended. Has good bowel sounds. No hepatosplenomegaly appreciated.  EXTREMITIES: Left foot dressing in place positive edema of the extremity NEUROLOGIC: The Donna Fisher is alert, awake, and oriented x3 with no focal motor or sensory deficits appreciated bilaterally.  SKIN:  No skin rash. Psych: Not anxious, depressed LN: No inguinal LN enlargement    Antibiotics   Anti-infectives    Start     Dose/Rate Route Frequency Ordered Stop   01/08/17 1600  ciprofloxacin (CIPRO) tablet 500 mg     500 mg Oral 2 times daily 01/08/17 1450     01/08/17 1000  vancomycin (VANCOCIN) IVPB 1000 mg/200 mL premix     1,000 mg 200 mL/hr over 60 Minutes Intravenous Every 12 hours 01/08/17 0844     01/06/17 2200  vancomycin (VANCOCIN) IVPB 1000 mg/200 mL premix  Status:  Discontinued     1,000 mg 200 mL/hr over 60 Minutes Intravenous Every 12 hours 01/06/17 1500 01/08/17 0844   01/06/17 1530  vancomycin (VANCOCIN) IVPB 1000 mg/200 mL premix     1,000 mg 200 mL/hr over 60 Minutes Intravenous  Once 01/06/17 1442 01/06/17 1739   01/03/17 1500  cefTRIAXone (ROCEPHIN) 2 g in dextrose 5 % 50 mL IVPB  Status:  Discontinued     2 g 100 mL/hr over 30 Minutes Intravenous Every 24 hours 01/03/17 1414 01/06/17 1432   01/03/17 1415  metroNIDAZOLE (FLAGYL) tablet 500 mg     500 mg Oral  Every 8 hours 01/03/17 1414     01/03/17 1400  meropenem (MERREM) 1 g in sodium chloride 0.9 % 100 mL IVPB  Status:  Discontinued     1 g 200 mL/hr over 30 Minutes Intravenous Every 8 hours 01/03/17 1030 01/03/17 1414   12/31/16 1800  meropenem (MERREM) 1 g in sodium chloride 0.9 % 100 mL IVPB  Status:  Discontinued     1 g 200 mL/hr over 30 Minutes Intravenous Every 12 hours 12/31/16 1204 01/03/17 1030   12/31/16 1630  vancomycin (VANCOCIN) 1,250 mg in sodium chloride 0.9 % 250 mL IVPB  Status:  Discontinued     1,250 mg 166.7 mL/hr over 90 Minutes Intravenous Every 24 hours 12/31/16 1213 01/03/17 1414   12/31/16 1600  vancomycin (VANCOCIN) 1,250 mg in sodium chloride 0.9 % 250 mL IVPB  Status:  Discontinued     1,250 mg 166.7 mL/hr over 90 Minutes Intravenous Every 24 hours 12/31/16 1204 12/31/16 1213   12/31/16 1045  aztreonam (AZACTAM) 2 g in dextrose 5 % 50 mL IVPB     2 g 100 mL/hr over 30 Minutes Intravenous  Once 12/31/16 1022 12/31/16 1113   12/31/16 1030  metroNIDAZOLE (FLAGYL) IVPB 500 mg     500 mg 100 mL/hr over 60 Minutes Intravenous  Once 12/31/16 1022 12/31/16 1200   12/31/16 1030  vancomycin (VANCOCIN) IVPB 1000 mg/200 mL premix     1,000 mg 200 mL/hr over 60 Minutes Intravenous  Once 12/31/16 1022 12/31/16 1217      Medications   Scheduled Meds: . ciprofloxacin  500 mg Oral BID  . docusate sodium  100 mg Oral BID  . enoxaparin (LOVENOX) injection  40 mg Subcutaneous Q24H  . insulin aspart  0-15 Units Subcutaneous TID WC  . insulin aspart  0-5 Units Subcutaneous QHS  .  insulin aspart  8 Units Subcutaneous TID WC  . insulin detemir  35 Units Subcutaneous QHS  . metroNIDAZOLE  500 mg Oral Q8H  . midodrine  5 mg Oral TID WC  . polyethylene glycol  17 g Oral Daily  . senna  1 tablet Oral Daily  . sodium chloride flush  10-40 mL Intracatheter Q12H   Continuous Infusions: . vancomycin Stopped (01/09/17 1154)   PRN Meds:.acetaminophen **OR** acetaminophen,  albuterol, hydrOXYzine, morphine injection, ondansetron **OR** ondansetron (ZOFRAN) IV, oxyCODONE, polyethylene glycol, sodium chloride flush   Data Review:   Micro Results Recent Results (from the past 240 hour(s))  Blood culture (routine x 2)     Status: None   Collection Time: 12/31/16 10:20 AM  Result Value Ref Range Status   Specimen Description BLOOD LEFT ANTECUBITAL  Final   Special Requests   Final    BOTTLES DRAWN AEROBIC AND ANAEROBIC Blood Culture adequate volume   Culture NO GROWTH 5 DAYS  Final   Report Status 01/05/2017 FINAL  Final  Urine culture     Status: Abnormal   Collection Time: 12/31/16 10:22 AM  Result Value Ref Range Status   Specimen Description URINE, CLEAN CATCH  Final   Special Requests NONE  Final   Culture (A)  Final    <10,000 COLONIES/mL INSIGNIFICANT GROWTH Performed at [facility], [address], [place], [place] [address]    Report Status 01/01/2017 FINAL  Final  Blood culture (routine x 2)     Status: None   Collection Time: 12/31/16 10:23 AM  Result Value Ref Range Status   Specimen Description BLOOD LEFT FEMORAL ARTERY  Final   Special Requests   Final    BOTTLES DRAWN AEROBIC AND ANAEROBIC Blood Culture adequate volume   Culture NO GROWTH 5 DAYS  Final   Report Status 01/05/2017 FINAL  Final  Surgical pcr screen     Status: None   Collection Time: 12/31/16  2:40 PM  Result Value Ref Range Status   MRSA, PCR NEGATIVE NEGATIVE Final   Staphylococcus aureus NEGATIVE NEGATIVE Final    Comment:        The Xpert SA Assay (FDA approved for NASAL specimens in patients over 55 years of age), is one component of a comprehensive surveillance program.  Test performance has been validated by [facility] for patients greater than or equal to 33 year old. It is not intended to diagnose infection nor to guide or monitor treatment.   Aerobic/Anaerobic Culture (surgical/deep wound)     Status: None   Collection Time: 12/31/16  4:30 PM   Result Value Ref Range Status   Specimen Description WOUND  Final   Special Requests NONE  Final   Gram Stain   Final    ABUNDANT WBC PRESENT, PREDOMINANTLY PMN RARE SQUAMOUS EPITHELIAL CELLS PRESENT ABUNDANT GRAM POSITIVE COCCI IN PAIRS MODERATE GRAM NEGATIVE RODS FEW GRAM POSITIVE RODS    Culture   Final    MODERATE STREPTOCOCCUS GROUP G NO ANAEROBES ISOLATED Performed at [facility], [place] [address] [place], [place] [address]    Report Status 01/06/2017 FINAL  Final  CULTURE, BLOOD (ROUTINE X 2) w Reflex to ID Panel     Status: None   Collection Time: 01/02/17  1:15 PM  Result Value Ref Range Status   Specimen Description BLOOD RIGHT ANTECUBITAL  Final   Special Requests   Final    BOTTLES DRAWN AEROBIC AND ANAEROBIC Blood Culture adequate volume  Culture NO GROWTH 5 DAYS  Final   Report Status 01/07/2017 FINAL  Final  CULTURE, BLOOD (ROUTINE X 2) w Reflex to ID Panel     Status: None   Collection Time: 01/02/17  1:15 PM  Result Value Ref Range Status   Specimen Description BLOOD BLOOD RIGHT FOREARM  Final   Special Requests   Final    BOTTLES DRAWN AEROBIC AND ANAEROBIC Blood Culture adequate volume   Culture NO GROWTH 5 DAYS  Final   Report Status 01/07/2017 FINAL  Final  Aerobic/Anaerobic Culture (surgical/deep wound)     Status: None (Preliminary result)   Collection Time: 01/06/17 11:06 AM  Result Value Ref Range Status   Specimen Description   Final    ABSCESS DEEP ABSCESS FROM 1ST AND 2ND METATARSAL REGION   Special Requests NONE  Final   Gram Stain   Final    ABUNDANT WBC PRESENT, PREDOMINANTLY PMN FEW GRAM POSITIVE COCCI IN PAIRS FEW GRAM POSITIVE RODS RARE GRAM NEGATIVE RODS    Culture   Final    CULTURE REINCUBATED FOR BETTER GROWTH HOLDING FOR POSSIBLE ANAEROBE Performed at [facility], [place] [address], [place], [place] [address]    Report Status PENDING  Incomplete    Radiology Reports Mr Foot Left Wo Contrast  Result Date:  01/06/2017 CLINICAL DATA:  Status post surgical debridement of a forefoot abscess. Surgery date 12/31/2016 EXAM: MRI OF THE LEFT FOOT WITHOUT CONTRAST TECHNIQUE: Multiplanar, multisequence MR imaging of the left foot was performed. No intravenous contrast was administered. COMPARISON:  Left foot radiographs 12/31/2016 FINDINGS: Large postoperative surgical defect/open wound involving the plantar aspect of the forefoot. No subcutaneous fluid collection to suggest an abscess. There is diffuse and fairly severe myofasciitis involving the forefoot musculature. No definite findings for pyomyositis. There is a 2.9 x 2.7 x 2.0 cm fluid collection surrounding the mid distal aspect of the second metatarsal. This is worrisome for an abscess. No definite signal abnormality in the second metatarsal to suggest osteomyelitis. No joint effusion or destructive changes to suggest septic arthritis. STIR signal abnormality in the third and fourth metatarsal heads and proximal phalanges. This could be reactive or arthropathic changes. I do not see any obvious joint effusions to suggest septic arthritis but given the adjacent abscess certainly could not exclude septic arthritis and osteomyelitis at these joints. There is also an open wound/surgical debridement changes along the plantar aspect of the third metatarsal phalangeal joint. IMPRESSION: 1. Postoperative change with a large open wound/surgical debridement site involving the plantar aspect of the forefoot. 2. 2.9 x 2.7 x 2.0 cm fluid collections surrounding the mid distal aspect of the second metatarsal is worrisome for a persistent or recurrent abscess. 3. Signal abnormality in the third and fourth metatarsal heads and proximal phalanges. This could be reactive or arthropathic changes. Although I do not see any obvious joint effusion could not exclude the possibility of septic arthritis and osteomyelitis particularly with a large adjacent open wound. 4. Cellulitis and  myofasciitis. Electronically Signed   By: Marijo Sanes M.D.   On: 01/06/2017 08:25   Dg Foot Complete Left  Result Date: 12/31/2016 CLINICAL DATA:  Diabetic ulcer on the bottom of the foot over the medial aspect of the great toe. EXAM: LEFT FOOT - COMPLETE 3+ VIEW COMPARISON:  Left foot series of December 24, 2016 FINDINGS: The bones are subjectively mildly osteopenic. No lytic or blastic bony lesions are observed. Subtle osteoarthritic changes are noted of the first MTP  joint and of the fifth MTP joint. There are plantar and Achilles region calcaneal spurs. There is small amount of soft tissue gas in the interspace between the first and second toes. There is gas over the base of the proximal phalanx of the great toe. There is mild soft tissue swelling over the dorsum of the foot with arterial calcification noted. IMPRESSION: Findings compatible with a known foot ulcer over the base of the great toe. No bony changes of osteomyelitis are observed. There are mild osteoarthritic changes elsewhere and there are plantar and Achilles region calcaneal spurs. Electronically Signed   By: David  Martinique M.D.   On: 12/31/2016 10:41   Dg Foot Complete Left  Result Date: 12/24/2016 CLINICAL DATA:  Diabetic ulcer with pain EXAM: LEFT FOOT - COMPLETE 3+ VIEW COMPARISON:  September 09, 2016 FINDINGS: Frontal, oblique, and lateral views were obtained. There is no evident fracture or dislocation. There is osteoarthritic change in the first MTP joint. There is also mild spurring in the dorsal midfoot. No erosive change or bony destruction. No soft tissue air or radiopaque foreign body evident. There are posterior and inferior calcaneal spurs. IMPRESSION: Areas of osteoarthritic change. Calcaneal spurs. No fracture or dislocation. No erosive change or bony destruction. No radiopaque foreign body. No soft tissue air to suggest abscess seen by radiography. Electronically Signed   By: Lowella Grip III M.D.   On: 12/24/2016 10:02      CBC  Recent Labs Lab 01/04/17 1121 01/05/17 0610 01/07/17 0500 01/08/17 0520 01/09/17 0458  WBC 20.6* 16.4* 18.4* 20.9* 22.1*  HGB 10.8* 10.9* 10.0* 10.6* 10.3*  HCT 32.3* 31.7* 29.5* 31.4* 30.8*  PLT 389 339 331 393 371  MCV 88.2 89.3 90.0 90.0 90.9  MCH 29.5 30.7 30.5 30.2 30.3  MCHC 33.4 34.4 33.8 33.6 33.4  RDW 12.6 12.5 12.6 12.8 12.6  LYMPHSABS 1.1  --   --   --  1.9  MONOABS 1.0*  --   --   --  1.3*  EOSABS 0.5  --   --   --  0.6  BASOSABS 0.1  --   --   --  0.1    Chemistries   Recent Labs Lab 01/03/17 0549 01/07/17 0500 01/08/17 0520  NA 135  --  136  K 3.4*  --  4.7  CL 107  --  98*  CO2 25  --  32  GLUCOSE 99  --  334*  BUN 17  --  16  CREATININE 0.66 0.78 0.92  CALCIUM 7.7*  --  8.3*   ------------------------------------------------------------------------------------------------------------------ estimated creatinine clearance is 79.7 mL/min (by C-G formula based on SCr of 0.92 mg/dL). ------------------------------------------------------------------------------------------------------------------ No results for input(s): HGBA1C in the last 72 hours. ------------------------------------------------------------------------------------------------------------------ No results for input(s): CHOL, HDL, LDLCALC, TRIG, CHOLHDL, LDLDIRECT in the last 72 hours. ------------------------------------------------------------------------------------------------------------------ No results for input(s): TSH, T4TOTAL, T3FREE, THYROIDAB in the last 72 hours.  Invalid input(s): FREET3 ------------------------------------------------------------------------------------------------------------------ No results for input(s): VITAMINB12, FOLATE, FERRITIN, TIBC, IRON, RETICCTPCT in the last 72 hours.  Coagulation profile No results for input(s): INR, PROTIME in the last 168 hours.  No results for input(s): DDIMER in the last 72 hours.  Cardiac Enzymes No  results for input(s): CKMB, TROPONINI, MYOGLOBIN in the last 168 hours.  Invalid input(s): CK ------------------------------------------------------------------------------------------------------------------ Invalid input(s): [place]  Donna Fisher with left foot infection  *Sepsis with hypotension blood pressure now improved  Due to low blood pressure after procedure had to be started  on midrodrine  *Left foot diabetic ulcer with cellulitis. Possible abscess. Sepsis present on admission. Status post incision and drainage   Antibiotics as per ID will need prolonged antibiotics Status post surgery  WBCs rising, need further decision by podiarty  * Insulin dependent diabetes mellitus.  Continue insulin therapy Continue levemir blood sugars improved  *Hypotension due to sepsis currently stable  * Acute kidney injury due to sepsis. Resolved with IV fluid  *Allergic reaction to antibiotics was on prednisone and Benadryl , Atarax as needed   stop now.  * DVT prophylaxis. Start Donna Fisher on Lovenox for DVT prophylaxis     Code Status Orders        Start     Ordered   12/31/16 1131  Full code  Continuous     12/31/16 1132    Code Status History    Date Active Date Inactive Code Status Order ID Comments User Context   This Donna Fisher has a current code status but no historical code status.     Consults podiatry  DVT Prophylaxis  Lovenox   Lab Results  Component Value Date   PLT 371 01/09/2017     Time Spent in minutes   35 min  Greater than 50% of time spent in care coordination and counseling Donna Fisher regarding the condition and plan of care.  Vaughan Basta M.D on 01/09/2017 at 12:41 PM  Between 7am to 6pm - Pager - [phone]  After 6pm go to [email or web address] - password [email or web address] [facility]   Office  [phone]

## 2017-01-09 NOTE — Anesthesia Procedure Notes (Addendum)
Procedure Name: LMA Insertion Date/Time: 01/09/2017 5:59 PM Performed by: Lendon Colonel Pre-anesthesia Checklist: Patient identified, Patient being monitored, Timeout performed, Emergency Drugs available and Suction available Patient Re-evaluated:Patient Re-evaluated prior to inductionOxygen Delivery Method: Circle system utilized Preoxygenation: Pre-oxygenation with 100% oxygen Intubation Type: IV induction Ventilation: Mask ventilation without difficulty LMA: LMA inserted LMA Size: 3.5 Tube type: Oral Number of attempts: 1 Placement Confirmation: positive ETCO2 and breath sounds checked- equal and bilateral Tube secured with: Tape Dental Injury: Teeth and Oropharynx as per pre-operative assessment

## 2017-01-09 NOTE — H&P (View-Only) (Signed)
3 Days Post-Op   Subjective/Chief Complaint: Patient seen. Complains of some burning in her left foot.   Objective: Vital signs in last 24 hours: Temp:  [97.6 F (36.4 C)-97.9 F (36.6 C)] 97.6 F (36.4 C) (06/24 0753) Pulse Rate:  [79-91] 84 (06/24 0753) Resp:  [16-18] 16 (06/24 0753) BP: (106-134)/(58-70) 106/60 (06/24 0753) SpO2:  [93 %-98 %] 98 % (06/24 0753) Weight:  [87.1 kg (192 lb 1.6 oz)] 87.1 kg (192 lb 1.6 oz) (06/24 0500) Last BM Date: 12/31/16  Intake/Output from previous day: 06/23 0701 - 06/24 0700 In: 1600 [P.O.:1200; IV Piggyback:400] Out: 0354 [Urine:1650] Intake/Output this shift: No intake/output data recorded.  Some mild to moderate drainage on the bandaging. Upon removal there is still some purulence on the packing from the dorsal aspect. Some drainage is starting to come from the distal aspect of the incision is well. Upon removal of the wound VAC there is some maceration with some necrosis of the deeper tissues along the edges of the open wound. No expressible purulence on palpation.  Lab Results:   Recent Labs  01/08/17 0520 01/09/17 0458  WBC 20.9* 22.1*  HGB 10.6* 10.3*  HCT 31.4* 30.8*  PLT 393 371   BMET  Recent Labs  01/07/17 0500 01/08/17 0520  NA  --  136  K  --  4.7  CL  --  98*  CO2  --  32  GLUCOSE  --  334*  BUN  --  16  CREATININE 0.78 0.92  CALCIUM  --  8.3*   PT/INR No results for input(s): LABPROT, INR in the last 72 hours. ABG No results for input(s): PHART, HCO3 in the last 72 hours.  Invalid input(s): PCO2, PO2  Studies/Results: No results found.  Anti-infectives: Anti-infectives    Start     Dose/Rate Route Frequency Ordered Stop   01/08/17 1600  ciprofloxacin (CIPRO) tablet 500 mg     500 mg Oral 2 times daily 01/08/17 1450     01/08/17 1000  vancomycin (VANCOCIN) IVPB 1000 mg/200 mL premix     1,000 mg 200 mL/hr over 60 Minutes Intravenous Every 12 hours 01/08/17 0844     01/06/17 2200  vancomycin  (VANCOCIN) IVPB 1000 mg/200 mL premix  Status:  Discontinued     1,000 mg 200 mL/hr over 60 Minutes Intravenous Every 12 hours 01/06/17 1500 01/08/17 0844   01/06/17 1530  vancomycin (VANCOCIN) IVPB 1000 mg/200 mL premix     1,000 mg 200 mL/hr over 60 Minutes Intravenous  Once 01/06/17 1442 01/06/17 1739   01/03/17 1500  cefTRIAXone (ROCEPHIN) 2 g in dextrose 5 % 50 mL IVPB  Status:  Discontinued     2 g 100 mL/hr over 30 Minutes Intravenous Every 24 hours 01/03/17 1414 01/06/17 1432   01/03/17 1415  metroNIDAZOLE (FLAGYL) tablet 500 mg     500 mg Oral Every 8 hours 01/03/17 1414     01/03/17 1400  meropenem (MERREM) 1 g in sodium chloride 0.9 % 100 mL IVPB  Status:  Discontinued     1 g 200 mL/hr over 30 Minutes Intravenous Every 8 hours 01/03/17 1030 01/03/17 1414   12/31/16 1800  meropenem (MERREM) 1 g in sodium chloride 0.9 % 100 mL IVPB  Status:  Discontinued     1 g 200 mL/hr over 30 Minutes Intravenous Every 12 hours 12/31/16 1204 01/03/17 1030   12/31/16 1630  vancomycin (VANCOCIN) 1,250 mg in sodium chloride 0.9 % 250 mL IVPB  Status:  Discontinued     1,250 mg 166.7 mL/hr over 90 Minutes Intravenous Every 24 hours 12/31/16 1213 01/03/17 1414   12/31/16 1600  vancomycin (VANCOCIN) 1,250 mg in sodium chloride 0.9 % 250 mL IVPB  Status:  Discontinued     1,250 mg 166.7 mL/hr over 90 Minutes Intravenous Every 24 hours 12/31/16 1204 12/31/16 1213   12/31/16 1045  aztreonam (AZACTAM) 2 g in dextrose 5 % 50 mL IVPB     2 g 100 mL/hr over 30 Minutes Intravenous  Once 12/31/16 1022 12/31/16 1113   12/31/16 1030  metroNIDAZOLE (FLAGYL) IVPB 500 mg     500 mg 100 mL/hr over 60 Minutes Intravenous  Once 12/31/16 1022 12/31/16 1200   12/31/16 1030  vancomycin (VANCOCIN) IVPB 1000 mg/200 mL premix     1,000 mg 200 mL/hr over 60 Minutes Intravenous  Once 12/31/16 1022 12/31/16 1217      Assessment/Plan: s/p Procedure(s): IRRIGATION AND DEBRIDEMENT FOOT (Left) APPLICATION OF WOUND VAC  (Left) Assessment: Abscess left foot with continued infection and elevated white count   Plan: Wound VAC was removed and sterile saline wet-to-dry dressings applied to the foot. Discussed with the patient that with her white count trending upward that I believe we should go in and re-debride the foot again to evaluate for any underlying abscesses and remove any devitalized tissue. At this point we will plan for this later this afternoon. Discussed risks and complications including inability to heal and continued infection as well as still being at risk for amputation of the left lower extremity if the infection cannot be maintained under control. Obtain consent for re-debridement of left foot. Patient will be nothing by mouth. Plan for surgery later this evening  LOS: 9 days    [name] 01/09/2017

## 2017-01-09 NOTE — Interval H&P Note (Signed)
History and Physical Interval Note:  01/09/2017 5:23 PM  Donna Fisher  has presented today for surgery, with the diagnosis of continued infection left foot abscess  The various methods of treatment have been discussed with the patient and family. After consideration of risks, benefits and other options for treatment, the patient has consented to  Procedure(s): IRRIGATION AND DEBRIDEMENT EXTREMITY (Left) as a surgical intervention .  The patient's history has been reviewed, patient examined, no change in status, stable for surgery.  I have reviewed the patient's chart and labs.  Questions were answered to the patient's satisfaction.     [name]

## 2017-01-09 NOTE — Op Note (Signed)
Date of operation: 01/09/2017.  Surgeon: [name] DPM.  Preoperative diagnosis: Continued abscess left foot status post prior debridement  Postoperative diagnosis: Same.  Procedure: Multiple site I&D deep abscess left foot.  Anesthesia: LMA with local.  Hemostasis: Pneumatic tourniquet left ankle 250 mmHg.  Estimated blood loss: 15 cc.  Implants: Stimulan rapid cure antibiotic beads impregnated with vancomycin and gentamicin.  Injectables: 20 cc 0.5% bupivacaine plain.  Complications: None apparent.  Operative indications: This is a 51 year old diabetic female with chronic wound on the left foot who was admitted for abscess and sepsis. Patient has had multiple previous debridements with continued purulence and elevated white count and decision was made for repeat I&D of the left foot.  Operative procedure: Patient was taken to the operating room and placed on the table in the supine position. Following satisfactory LMA anesthesia a pneumatic tourniquet was applied at the level of the left ankle and the foot was prepped and draped in the usual sterile fashion.    Attention was directed to the plantar and dorsal left foot where previous sutures were removed from the incisions. The wounds were grossly inspected with manual manipulation and there was noted to be some continued abscess from the proximal aspect of the wound extending down to the medial aspect of the heel as well as significant purulence still noted both dorsal and plantar in the first interspace between the first and second metatarsals. Significant bleeding was encountered at this point and decision was made to exsanguinate the foot and elevate the tourniquet. Some necrosis was noted of the plantar incision and this was sharply excised using a 15 blade. Deeper devitalized tissue was noted in the first interspace and around the medial and lateral aspect of the previous wound and this was sharply excised using a ronguer down to  the deep subcutaneous and fascial levels. At this point both the dorsal and plantar wounds were thoroughly debrided using a versa jet debrider on a setting of 5. The proximal wound extending down to the medial calcaneus was thoroughly debrided and irrigated with the versa jet on a setting of 2. The entirety of the wounds both dorsal and plantar were then thoroughly debrided and irrigated again with the versa jet on a setting of 2 and then the remainder of the 3 L bag was used under pulsed irrigation to thoroughly flushed the wound. At this point the Stimulan rapid cure antibiotic beads were packed into the proximal wound extending down to the medial calcaneus. The proximal portion of the medial incision was closed using 3-0 nylon simple interrupted sutures. The distal aspect was also closed using 3-0 nylon simple interrupted suture leaving a large defect in the arch. The antibiotic beads were also then placed dorsally through the wound and packed down into the first interspace extending down to the plantar aspect and then the dorsal incision was closed using 3-0 nylon simple interrupted sutures leaving only the distal aspect open for drainage. At this point the remainder of the beads were then placed in the plantar defect and this was covered using a Mepitel and staples. Proximally 20 cc of 0.5% bupivacaine plain was then injected into the foot for postoperative analgesia. Xeroform applied to the incision areas followed by 4 x 4's fluffs ABDs and Kerlix. Tourniquet was released on the left foot and then an additional Kerlix and Ace wrap were applied to the left foot for additional padding and compression. Patient tolerated the procedure and anesthesia well and was transported to the  PACU with vital signs stable and in good condition.

## 2017-01-09 NOTE — Progress Notes (Signed)
3 Days Post-Op   Subjective/Chief Complaint: Patient seen. Complains of some burning in her left foot.   Objective: Vital signs in last 24 hours: Temp:  [97.6 F (36.4 C)-97.9 F (36.6 C)] 97.6 F (36.4 C) (06/24 0753) Pulse Rate:  [79-91] 84 (06/24 0753) Resp:  [16-18] 16 (06/24 0753) BP: (106-134)/(58-70) 106/60 (06/24 0753) SpO2:  [93 %-98 %] 98 % (06/24 0753) Weight:  [87.1 kg (192 lb 1.6 oz)] 87.1 kg (192 lb 1.6 oz) (06/24 0500) Last BM Date: 12/31/16  Intake/Output from previous day: 06/23 0701 - 06/24 0700 In: 1600 [P.O.:1200; IV Piggyback:400] Out: 5697 [Urine:1650] Intake/Output this shift: No intake/output data recorded.  Some mild to moderate drainage on the bandaging. Upon removal there is still some purulence on the packing from the dorsal aspect. Some drainage is starting to come from the distal aspect of the incision is well. Upon removal of the wound VAC there is some maceration with some necrosis of the deeper tissues along the edges of the open wound. No expressible purulence on palpation.  Lab Results:   Recent Labs  01/08/17 0520 01/09/17 0458  WBC 20.9* 22.1*  HGB 10.6* 10.3*  HCT 31.4* 30.8*  PLT 393 371   BMET  Recent Labs  01/07/17 0500 01/08/17 0520  NA  --  136  K  --  4.7  CL  --  98*  CO2  --  32  GLUCOSE  --  334*  BUN  --  16  CREATININE 0.78 0.92  CALCIUM  --  8.3*   PT/INR No results for input(s): LABPROT, INR in the last 72 hours. ABG No results for input(s): PHART, HCO3 in the last 72 hours.  Invalid input(s): PCO2, PO2  Studies/Results: No results found.  Anti-infectives: Anti-infectives    Start     Dose/Rate Route Frequency Ordered Stop   01/08/17 1600  ciprofloxacin (CIPRO) tablet 500 mg     500 mg Oral 2 times daily 01/08/17 1450     01/08/17 1000  vancomycin (VANCOCIN) IVPB 1000 mg/200 mL premix     1,000 mg 200 mL/hr over 60 Minutes Intravenous Every 12 hours 01/08/17 0844     01/06/17 2200  vancomycin  (VANCOCIN) IVPB 1000 mg/200 mL premix  Status:  Discontinued     1,000 mg 200 mL/hr over 60 Minutes Intravenous Every 12 hours 01/06/17 1500 01/08/17 0844   01/06/17 1530  vancomycin (VANCOCIN) IVPB 1000 mg/200 mL premix     1,000 mg 200 mL/hr over 60 Minutes Intravenous  Once 01/06/17 1442 01/06/17 1739   01/03/17 1500  cefTRIAXone (ROCEPHIN) 2 g in dextrose 5 % 50 mL IVPB  Status:  Discontinued     2 g 100 mL/hr over 30 Minutes Intravenous Every 24 hours 01/03/17 1414 01/06/17 1432   01/03/17 1415  metroNIDAZOLE (FLAGYL) tablet 500 mg     500 mg Oral Every 8 hours 01/03/17 1414     01/03/17 1400  meropenem (MERREM) 1 g in sodium chloride 0.9 % 100 mL IVPB  Status:  Discontinued     1 g 200 mL/hr over 30 Minutes Intravenous Every 8 hours 01/03/17 1030 01/03/17 1414   12/31/16 1800  meropenem (MERREM) 1 g in sodium chloride 0.9 % 100 mL IVPB  Status:  Discontinued     1 g 200 mL/hr over 30 Minutes Intravenous Every 12 hours 12/31/16 1204 01/03/17 1030   12/31/16 1630  vancomycin (VANCOCIN) 1,250 mg in sodium chloride 0.9 % 250 mL IVPB  Status:  Discontinued     1,250 mg 166.7 mL/hr over 90 Minutes Intravenous Every 24 hours 12/31/16 1213 01/03/17 1414   12/31/16 1600  vancomycin (VANCOCIN) 1,250 mg in sodium chloride 0.9 % 250 mL IVPB  Status:  Discontinued     1,250 mg 166.7 mL/hr over 90 Minutes Intravenous Every 24 hours 12/31/16 1204 12/31/16 1213   12/31/16 1045  aztreonam (AZACTAM) 2 g in dextrose 5 % 50 mL IVPB     2 g 100 mL/hr over 30 Minutes Intravenous  Once 12/31/16 1022 12/31/16 1113   12/31/16 1030  metroNIDAZOLE (FLAGYL) IVPB 500 mg     500 mg 100 mL/hr over 60 Minutes Intravenous  Once 12/31/16 1022 12/31/16 1200   12/31/16 1030  vancomycin (VANCOCIN) IVPB 1000 mg/200 mL premix     1,000 mg 200 mL/hr over 60 Minutes Intravenous  Once 12/31/16 1022 12/31/16 1217      Assessment/Plan: s/p Procedure(s): IRRIGATION AND DEBRIDEMENT FOOT (Left) APPLICATION OF WOUND VAC  (Left) Assessment: Abscess left foot with continued infection and elevated white count   Plan: Wound VAC was removed and sterile saline wet-to-dry dressings applied to the foot. Discussed with the patient that with her white count trending upward that I believe we should go in and re-debride the foot again to evaluate for any underlying abscesses and remove any devitalized tissue. At this point we will plan for this later this afternoon. Discussed risks and complications including inability to heal and continued infection as well as still being at risk for amputation of the left lower extremity if the infection cannot be maintained under control. Obtain consent for re-debridement of left foot. Patient will be nothing by mouth. Plan for surgery later this evening  LOS: 9 days    [name] 01/09/2017

## 2017-01-09 NOTE — Anesthesia Postprocedure Evaluation (Signed)
Anesthesia Post Note  Patient: Donna Fisher  Procedure(s) Performed: Procedure(s) (LRB): IRRIGATION AND DEBRIDEMENT EXTREMITY (Left)  Patient location during evaluation: PACU Anesthesia Type: General Level of consciousness: awake and alert and oriented Pain management: pain level controlled Vital Signs Assessment: post-procedure vital signs reviewed and stable Respiratory status: spontaneous breathing, nonlabored ventilation and respiratory function stable Cardiovascular status: blood pressure returned to baseline and stable Postop Assessment: no signs of nausea or vomiting Anesthetic complications: no     Last Vitals:  Vitals:   01/09/17 2002 01/09/17 2015  BP: 104/69 119/71  Pulse: 85 91  Resp: (!) 6 19  Temp:  36.4 C    Last Pain:  Vitals:   01/09/17 2015  TempSrc: Oral  PainSc:                  [name]

## 2017-01-09 NOTE — Anesthesia Post-op Follow-up Note (Cosign Needed)
Anesthesia QCDR form completed.        

## 2017-01-09 NOTE — Progress Notes (Signed)
Pharmacy Antibiotic Note  Donna Fisher is a 51 y.o. female admitted on 12/31/2016 with wound infection.  Pharmacy has been consulted for vancomycin dosing.  Plan: Continue vancomycin 1000 mg iv q 12 hours. Will continue to follow renal function closely and check levels at least weekly or as indicated.   Height: 5\' 5"  (165.1 cm) Weight: 192 lb 1.6 oz (87.1 kg) IBW/kg (Calculated) : 57  Temp (24hrs), Avg:97.8 F (36.6 C), Min:97.6 F (36.4 C), Max:97.9 F (36.6 C)   Recent Labs Lab 01/03/17 0549 01/04/17 1121 01/05/17 0610 01/07/17 0500 01/08/17 0520 01/08/17 0857 01/09/17 0458 01/09/17 0943  WBC 15.5* 20.6* 16.4* 18.4* 20.9*  --  22.1*  --   CREATININE 0.66  --   --  0.78 0.92  --   --   --   VANCOTROUGH  --   --   --   --   --  16  --  17    Estimated Creatinine Clearance: 79.7 mL/min (by C-G formula based on SCr of 0.92 mg/dL).    Allergies  Allergen Reactions  . Penicillins Swelling    .Has patient had a PCN reaction causing immediate rash, facial/tongue/throat swelling, SOB or lightheadedness with hypotension: No Has patient had a PCN reaction causing severe rash involving mucus membranes or skin necrosis: No Has patient had a PCN reaction that required hospitalization: No Has patient had a PCN reaction occurring within the last 10 years: No If all of the above answers are "NO", then may proceed with Cephalosporin use.   . Codeine Itching  . Ceftriaxone Rash    Aztreonam/Metronidazole 6/15 x 1.  Meropenem 6/15 >> 6/18 Vancomycin 6/15 >> 6/18, 6/21 >> CTX 6/18 >> 6/21 Metronidazole 6/18 >>   6/15 BCx x2 NG final UCx insig growth 6/17 BCx x2 NG final 6/15 WCx from OR mod group G strep, no anaerobes - final 6/21 WCx from OR pending  Thank you for allowing pharmacy to be a part of this patient's care.  Ulice Dash D 01/09/2017 10:26 AM

## 2017-01-09 NOTE — Anesthesia Preprocedure Evaluation (Signed)
Anesthesia Evaluation  Patient identified by MRN, date of birth, ID band Patient awake    Reviewed: Allergy & Precautions, NPO status , Patient's Chart, lab work & pertinent test results  History of Anesthesia Complications Negative for: history of anesthetic complications (reports one instance of slow wake up after hysterectomy)  Airway Mallampati: III  TM Distance: >3 FB Neck ROM: Full    Dental  (+) Poor Dentition   Pulmonary neg sleep apnea, neg COPD, Current Smoker,    breath sounds clear to auscultation- rhonchi (-) wheezing      Cardiovascular Exercise Tolerance: Good (-) hypertension(-) CAD and (-) Past MI  Rhythm:Regular Rate:Normal - Systolic murmurs and - Diastolic murmurs    Neuro/Psych  Headaches, Anxiety    GI/Hepatic negative GI ROS, Neg liver ROS,   Endo/Other  diabetes, Insulin Dependent  Renal/GU negative Renal ROS     Musculoskeletal negative musculoskeletal ROS (+)   Abdominal (+) - obese,   Peds  Hematology negative hematology ROS (+)   Anesthesia Other Findings Past Medical History: No date: Anxiety state, unspecified No date: Diabetic neuropathy ([place]) No date: Genital herpes, unspecified No date: Headache(784.0) No date: Other and unspecified hyperlipidemia No date: Pain in joint, pelvic region and thigh No date: Type II or unspecified type diabetes mellitus * No date: Urinary tract infection, site not specified No date: Vaginitis and vulvovaginitis, unspecified No date: Vaginitis and vulvovaginitis, unspecified   Reproductive/Obstetrics                             Anesthesia Physical  Anesthesia Plan  ASA: III  Anesthesia Plan: General   Post-op Pain Management:    Induction: Intravenous  PONV Risk Score and Plan: 1 and Ondansetron, Dexamethasone, Propofol and Midazolam  Airway Management Planned: LMA  Additional Equipment:   Intra-op Plan:    Post-operative Plan:   Informed Consent: I have reviewed the patients History and Physical, chart, labs and discussed the procedure including the risks, benefits and alternatives for the proposed anesthesia with the patient or authorized representative who has indicated his/her understanding and acceptance.   Dental advisory given  Plan Discussed with: CRNA and Anesthesiologist  Anesthesia Plan Comments:         Anesthesia Quick Evaluation

## 2017-01-10 ENCOUNTER — Encounter: Payer: Self-pay | Admitting: Podiatry

## 2017-01-10 LAB — CBC WITH DIFFERENTIAL/PLATELET
Basophils Absolute: 0.1 10*3/uL (ref 0–0.1)
Basophils Relative: 0 %
EOS ABS: 0.3 10*3/uL (ref 0–0.7)
Eosinophils Relative: 1 %
HCT: 31.3 % — ABNORMAL LOW (ref 35.0–47.0)
HEMOGLOBIN: 10.6 g/dL — AB (ref 12.0–16.0)
LYMPHS ABS: 3.1 10*3/uL (ref 1.0–3.6)
LYMPHS PCT: 12 %
MCH: 30.6 pg (ref 26.0–34.0)
MCHC: 33.8 g/dL (ref 32.0–36.0)
MCV: 90.6 fL (ref 80.0–100.0)
MONOS PCT: 6 %
Monocytes Absolute: 1.7 10*3/uL — ABNORMAL HIGH (ref 0.2–0.9)
NEUTROS ABS: 22 10*3/uL — AB (ref 1.4–6.5)
NEUTROS PCT: 81 %
Platelets: 390 10*3/uL (ref 150–440)
RBC: 3.45 MIL/uL — AB (ref 3.80–5.20)
RDW: 13 % (ref 11.5–14.5)
WBC: 27.2 10*3/uL — AB (ref 3.6–11.0)

## 2017-01-10 LAB — GLUCOSE, CAPILLARY
GLUCOSE-CAPILLARY: 284 mg/dL — AB (ref 65–99)
Glucose-Capillary: 378 mg/dL — ABNORMAL HIGH (ref 65–99)
Glucose-Capillary: 293 mg/dL — ABNORMAL HIGH (ref 65–99)
Glucose-Capillary: 224 mg/dL — ABNORMAL HIGH (ref 65–99)

## 2017-01-10 MED ORDER — PREMIER PROTEIN SHAKE
11.0000 [oz_av] | ORAL | Status: DC
  Administered 2017-01-10: 11 [oz_av] via ORAL

## 2017-01-10 MED ORDER — ADULT MULTIVITAMIN W/MINERALS CH
1.0000 | ORAL_TABLET | Freq: Every day | ORAL | Status: DC
  Administered 2017-01-11 – 2017-01-12 (×2): 1 via ORAL
  Filled 2017-01-10 (×3): qty 1

## 2017-01-10 NOTE — Progress Notes (Signed)
Initial Nutrition Assessment  DOCUMENTATION CODES:   Not applicable  INTERVENTION:  Patient currently refusing education on carbohydrate counting. She would benefit from education during hospital stay and being followed by outpatient RD for ongoing counseling.   Encouraged adequate intake of protein at meals to help promote healing.  Provide Premier Protein po once daily, each supplement provides 160 kcal and 30 grams of protein.  Provide multivitamin with minerals daily.  NUTRITION DIAGNOSIS:   Increased nutrient needs related to wound healing as evidenced by estimated needs.  GOAL:   Patient will meet greater than or equal to 90% of their needs  MONITOR:   PO intake, Supplement acceptance, Labs, Weight trends, I & O's  REASON FOR ASSESSMENT:   LOS    ASSESSMENT:   51 year old female with PMHx of DM type 2 with diabetic neuropathy, HLD, anxiety, chronic left foot ulcer who presented with redness, swelling, and pain in left foot admitted with left foot diabetic ulcer with cellulitis.   -Pt s/p I&D on 2/44, I&D and application of Wound VAC on 6/21, and another I&D on 6/24.  Spoke with patient at bedside. She reports her appetite is good, she is just getting tired of the food here. She reports at home she typically eats 3-4 small meals per day. She eats pasta, spaghetti, macaroni, steak, egg or chicken salad, and soups. For breakfast she eats eggs and potatoes. Patient reports her blood sugars have been high at home lately in the 300-400 range. She reports it is because she has not been taking her insulin for the past 6 months. This RD asked patient about if she follows a special diet at home or counts carbohydrates to help manage blood sugar. Patient reports she "watches what she eats and her portions" but does not count carbs. Noted several bottles of soda on patient's table. Discussed that one important factor in helping to heal her wound is good management of blood sugars.  Offered to provide education on carbohydrate counting and diet, but patient refusing at this time. Also discussed need for adequate protein. Patient reports she does not always eat protein at every meal. She is amenable to drinking Premier Protein once daily.  Patient reports her UBW is 155-165 lbs and she has been weight stable. She reports current weights in chart in 190 range are inaccurate due to her bed scale. Weight of 169.5 lbs (76.9 kg) on 01/01/2017 is likely closes to patient's true weight - will use this to estimate needs.  Meal Completion: 90-100%  Medications reviewed and include: Cipro, Colace, Novolog 0-15 units TID, Novolog 0-5 units QHS, Novolog 8 units TID, Levemir 35 units QHS, Flagyl, Miralax, senna, vancomycin.  Labs reviewed: CBG 130-378 past 24 hrs. HgbA1c 10.8 on 01/01/2017.   Nutrition-Focused physical exam completed. Findings are no fat depletion, no muscle depletion, and mild edema.   Patient does not meet criteria for malnutrition at this time.  Discussed with RN.  Diet Order:  Diet Carb Modified Fluid consistency: Thin; Room service appropriate? Yes  Skin:  Wound (see comment) (diabetic ulcer left foot, closed incisions left foot)  Last BM:  01/09/2017 - type 4  Height:   Ht Readings from Last 1 Encounters:  01/06/17 5\' 5"  (1.651 m)    Weight:   Wt Readings from Last 1 Encounters:  01/10/17 197 lb (89.4 kg)    Ideal Body Weight:  56.8 kg  BMI:  Body mass index is 32.78 kg/m.  Estimated Nutritional Needs:   Kcal:  2449-7530 (MSJ x 1.2-1.4)  Protein:  90-105 grams (1.2-1.4 grams/kg)  Fluid:  2.3 L/day (30 ml/kg)  EDUCATION NEEDS:   Education needs no appropriate at this time (Patient refusing education.)  Willey Blade, MS, RD, LDN Pager: [phone] After Hours Pager: [phone]

## 2017-01-10 NOTE — Progress Notes (Signed)
[place] at [facility]                                                                                                                                                                                  Patient Demographics   Donna Fisher, is a 51 y.o. female, DOB - 06-07-1966, IPJ:[id number]  Admit date - 12/31/2016   Admitting Physician Hillary Bow, MD  Outpatient Primary MD for the patient is Patient, No Pcp Per   LOS - 10  Subjective: Patient complains Pain in her leg. Some sensation on left toes.  Review of Systems:   CONSTITUTIONAL: Hypothermia yest. No fatigue, weakness. No weight gain, no weight loss.  EYES: No blurry or double vision.  ENT: No tinnitus. No postnasal drip. No redness of the oropharynx.  RESPIRATORY: No cough, no wheeze, no hemoptysis. No dyspnea.  CARDIOVASCULAR: No chest pain. No orthopnea. No palpitations. No syncope.  GASTROINTESTINAL: Positive nausea, no vomiting or diarrhea. No abdominal pain. No melena or hematochezia.  GENITOURINARY: No dysuria or hematuria.  ENDOCRINE: No polyuria or nocturia. No heat or cold intolerance.  HEMATOLOGY: No anemia. No bruising. No bleeding.  INTEGUMENTARY: Positive rashes. No lesions. Positive itching  MUSCULOSKELETAL: No arthritis. No swelling. No gout.  NEUROLOGIC: No numbness, tingling, or ataxia. No seizure-type activity.  PSYCHIATRIC: No anxiety. No insomnia. No ADD.    Vitals:   Vitals:   01/10/17 0742 01/10/17 0743 01/10/17 0804 01/10/17 1122  BP: (!) 88/45 (!) 94/41 (!) 101/56 (!) 96/51  Pulse: 91 91 91 94  Resp:   18 18  Temp: 98.4 F (36.9 C)   98.2 F (36.8 C)  TempSrc: Oral   Oral  SpO2: 97% 96%  94%  Weight:      Height:        Wt Readings from Last 3 Encounters:  01/10/17 89.4 kg (197 lb)  09/09/16 71.7 kg (158 lb)  12/25/15 72.6 kg (160 lb)     Intake/Output Summary (Last 24 hours) at 01/10/17 1428 Last data filed at 01/10/17 1319  Gross per 24 hour   Intake             1310 ml  Output                0 ml  Net             1310 ml    Physical Exam:   GENERAL: Pleasant-appearing in no apparent distress.  HEAD, EYES, EARS, NOSE AND THROAT: Atraumatic, normocephalic. Extraocular muscles are intact. Pupils equal and reactive to light. Sclerae anicteric. No conjunctival injection. No oro-pharyngeal erythema.  NECK: Supple. There is no jugular venous distention.  No bruits, no lymphadenopathy, no thyromegaly.  HEART: Regular rate and rhythm,. No murmurs, no rubs, no clicks.  LUNGS: Clear to auscultation bilaterally. No rales or rhonchi. No wheezes.  ABDOMEN: Soft, flat, nontender, nondistended. Has good bowel sounds. No hepatosplenomegaly appreciated.  EXTREMITIES: Left foot dressing in place positive edema of the extremity NEUROLOGIC: The patient is alert, awake, and oriented x3 with no focal motor or sensory deficits appreciated bilaterally.  SKIN:  No skin rash. Psych: Not anxious, depressed LN: No inguinal LN enlargement    Antibiotics   Anti-infectives    Start     Dose/Rate Route Frequency Ordered Stop   01/08/17 1600  ciprofloxacin (CIPRO) tablet 500 mg     500 mg Oral 2 times daily 01/08/17 1450     01/08/17 1000  vancomycin (VANCOCIN) IVPB 1000 mg/200 mL premix     1,000 mg 200 mL/hr over 60 Minutes Intravenous Every 12 hours 01/08/17 0844     01/06/17 2200  vancomycin (VANCOCIN) IVPB 1000 mg/200 mL premix  Status:  Discontinued     1,000 mg 200 mL/hr over 60 Minutes Intravenous Every 12 hours 01/06/17 1500 01/08/17 0844   01/06/17 1530  vancomycin (VANCOCIN) IVPB 1000 mg/200 mL premix     1,000 mg 200 mL/hr over 60 Minutes Intravenous  Once 01/06/17 1442 01/06/17 1739   01/03/17 1500  cefTRIAXone (ROCEPHIN) 2 g in dextrose 5 % 50 mL IVPB  Status:  Discontinued     2 g 100 mL/hr over 30 Minutes Intravenous Every 24 hours 01/03/17 1414 01/06/17 1432   01/03/17 1415  metroNIDAZOLE (FLAGYL) tablet 500 mg     500 mg Oral  Every 8 hours 01/03/17 1414     01/03/17 1400  meropenem (MERREM) 1 g in sodium chloride 0.9 % 100 mL IVPB  Status:  Discontinued     1 g 200 mL/hr over 30 Minutes Intravenous Every 8 hours 01/03/17 1030 01/03/17 1414   12/31/16 1800  meropenem (MERREM) 1 g in sodium chloride 0.9 % 100 mL IVPB  Status:  Discontinued     1 g 200 mL/hr over 30 Minutes Intravenous Every 12 hours 12/31/16 1204 01/03/17 1030   12/31/16 1630  vancomycin (VANCOCIN) 1,250 mg in sodium chloride 0.9 % 250 mL IVPB  Status:  Discontinued     1,250 mg 166.7 mL/hr over 90 Minutes Intravenous Every 24 hours 12/31/16 1213 01/03/17 1414   12/31/16 1600  vancomycin (VANCOCIN) 1,250 mg in sodium chloride 0.9 % 250 mL IVPB  Status:  Discontinued     1,250 mg 166.7 mL/hr over 90 Minutes Intravenous Every 24 hours 12/31/16 1204 12/31/16 1213   12/31/16 1045  aztreonam (AZACTAM) 2 g in dextrose 5 % 50 mL IVPB     2 g 100 mL/hr over 30 Minutes Intravenous  Once 12/31/16 1022 12/31/16 1113   12/31/16 1030  metroNIDAZOLE (FLAGYL) IVPB 500 mg     500 mg 100 mL/hr over 60 Minutes Intravenous  Once 12/31/16 1022 12/31/16 1200   12/31/16 1030  vancomycin (VANCOCIN) IVPB 1000 mg/200 mL premix     1,000 mg 200 mL/hr over 60 Minutes Intravenous  Once 12/31/16 1022 12/31/16 1217      Medications   Scheduled Meds: . ciprofloxacin  500 mg Oral BID  . docusate sodium  100 mg Oral BID  . enoxaparin (LOVENOX) injection  40 mg Subcutaneous Q24H  . insulin aspart  0-15 Units Subcutaneous TID WC  . insulin aspart  0-5 Units Subcutaneous QHS  .  insulin aspart  8 Units Subcutaneous TID WC  . insulin detemir  35 Units Subcutaneous QHS  . metroNIDAZOLE  500 mg Oral Q8H  . midodrine  5 mg Oral TID WC  . [START ON 01/11/2017] multivitamin with minerals  1 tablet Oral Daily  . polyethylene glycol  17 g Oral Daily  . protein supplement shake  11 oz Oral Q24H  . senna  1 tablet Oral Daily  . sodium chloride flush  10-40 mL Intracatheter Q12H    Continuous Infusions: . vancomycin Stopped (01/10/17 1052)   PRN Meds:.acetaminophen **OR** acetaminophen, albuterol, hydrOXYzine, morphine injection, ondansetron **OR** ondansetron (ZOFRAN) IV, oxyCODONE, polyethylene glycol, sodium chloride flush   Data Review:   Micro Results Recent Results (from the past 240 hour(s))  Surgical pcr screen     Status: None   Collection Time: 12/31/16  2:40 PM  Result Value Ref Range Status   MRSA, PCR NEGATIVE NEGATIVE Final   Staphylococcus aureus NEGATIVE NEGATIVE Final    Comment:        The Xpert SA Assay (FDA approved for NASAL specimens in patients over 41 years of age), is one component of a comprehensive surveillance program.  Test performance has been validated by [facility] for patients greater than or equal to 51 year old. It is not intended to diagnose infection nor to guide or monitor treatment.   Aerobic/Anaerobic Culture (surgical/deep wound)     Status: None   Collection Time: 12/31/16  4:30 PM  Result Value Ref Range Status   Specimen Description WOUND  Final   Special Requests NONE  Final   Gram Stain   Final    ABUNDANT WBC PRESENT, PREDOMINANTLY PMN RARE SQUAMOUS EPITHELIAL CELLS PRESENT ABUNDANT GRAM POSITIVE COCCI IN PAIRS MODERATE GRAM NEGATIVE RODS FEW GRAM POSITIVE RODS    Culture   Final    MODERATE STREPTOCOCCUS GROUP G NO ANAEROBES ISOLATED Performed at [facility], [place] [address], [place], [place] [address]    Report Status 01/06/2017 FINAL  Final  CULTURE, BLOOD (ROUTINE X 2) w Reflex to ID Panel     Status: None   Collection Time: 01/02/17  1:15 PM  Result Value Ref Range Status   Specimen Description BLOOD RIGHT ANTECUBITAL  Final   Special Requests   Final    BOTTLES DRAWN AEROBIC AND ANAEROBIC Blood Culture adequate volume   Culture NO GROWTH 5 DAYS  Final   Report Status 01/07/2017 FINAL  Final  CULTURE, BLOOD (ROUTINE X 2) w Reflex to ID Panel     Status: None   Collection Time:  01/02/17  1:15 PM  Result Value Ref Range Status   Specimen Description BLOOD BLOOD RIGHT FOREARM  Final   Special Requests   Final    BOTTLES DRAWN AEROBIC AND ANAEROBIC Blood Culture adequate volume   Culture NO GROWTH 5 DAYS  Final   Report Status 01/07/2017 FINAL  Final  Aerobic/Anaerobic Culture (surgical/deep wound)     Status: None (Preliminary result)   Collection Time: 01/06/17 11:06 AM  Result Value Ref Range Status   Specimen Description   Final    ABSCESS DEEP ABSCESS FROM 1ST AND 2ND METATARSAL REGION   Special Requests NONE  Final   Gram Stain   Final    ABUNDANT WBC PRESENT, PREDOMINANTLY PMN FEW GRAM POSITIVE COCCI IN PAIRS FEW GRAM POSITIVE RODS RARE GRAM NEGATIVE RODS    Culture   Final    CULTURE REINCUBATED FOR BETTER GROWTH HOLDING FOR  POSSIBLE ANAEROBE Performed at [facility], [place] [address], [place], [place] [address]    Report Status PENDING  Incomplete    Radiology Reports Mr Foot Left Wo Contrast  Result Date: 01/06/2017 CLINICAL DATA:  Status post surgical debridement of a forefoot abscess. Surgery date 12/31/2016 EXAM: MRI OF THE LEFT FOOT WITHOUT CONTRAST TECHNIQUE: Multiplanar, multisequence MR imaging of the left foot was performed. No intravenous contrast was administered. COMPARISON:  Left foot radiographs 12/31/2016 FINDINGS: Large postoperative surgical defect/open wound involving the plantar aspect of the forefoot. No subcutaneous fluid collection to suggest an abscess. There is diffuse and fairly severe myofasciitis involving the forefoot musculature. No definite findings for pyomyositis. There is a 2.9 x 2.7 x 2.0 cm fluid collection surrounding the mid distal aspect of the second metatarsal. This is worrisome for an abscess. No definite signal abnormality in the second metatarsal to suggest osteomyelitis. No joint effusion or destructive changes to suggest septic arthritis. STIR signal abnormality in the third and fourth metatarsal heads  and proximal phalanges. This could be reactive or arthropathic changes. I do not see any obvious joint effusions to suggest septic arthritis but given the adjacent abscess certainly could not exclude septic arthritis and osteomyelitis at these joints. There is also an open wound/surgical debridement changes along the plantar aspect of the third metatarsal phalangeal joint. IMPRESSION: 1. Postoperative change with a large open wound/surgical debridement site involving the plantar aspect of the forefoot. 2. 2.9 x 2.7 x 2.0 cm fluid collections surrounding the mid distal aspect of the second metatarsal is worrisome for a persistent or recurrent abscess. 3. Signal abnormality in the third and fourth metatarsal heads and proximal phalanges. This could be reactive or arthropathic changes. Although I do not see any obvious joint effusion could not exclude the possibility of septic arthritis and osteomyelitis particularly with a large adjacent open wound. 4. Cellulitis and myofasciitis. Electronically Signed   By: Marijo Sanes M.D.   On: 01/06/2017 08:25   Dg Foot Complete Left  Result Date: 12/31/2016 CLINICAL DATA:  Diabetic ulcer on the bottom of the foot over the medial aspect of the great toe. EXAM: LEFT FOOT - COMPLETE 3+ VIEW COMPARISON:  Left foot series of December 24, 2016 FINDINGS: The bones are subjectively mildly osteopenic. No lytic or blastic bony lesions are observed. Subtle osteoarthritic changes are noted of the first MTP joint and of the fifth MTP joint. There are plantar and Achilles region calcaneal spurs. There is small amount of soft tissue gas in the interspace between the first and second toes. There is gas over the base of the proximal phalanx of the great toe. There is mild soft tissue swelling over the dorsum of the foot with arterial calcification noted. IMPRESSION: Findings compatible with a known foot ulcer over the base of the great toe. No bony changes of osteomyelitis are observed. There  are mild osteoarthritic changes elsewhere and there are plantar and Achilles region calcaneal spurs. Electronically Signed   By: David  Martinique M.D.   On: 12/31/2016 10:41   Dg Foot Complete Left  Result Date: 12/24/2016 CLINICAL DATA:  Diabetic ulcer with pain EXAM: LEFT FOOT - COMPLETE 3+ VIEW COMPARISON:  September 09, 2016 FINDINGS: Frontal, oblique, and lateral views were obtained. There is no evident fracture or dislocation. There is osteoarthritic change in the first MTP joint. There is also mild spurring in the dorsal midfoot. No erosive change or bony destruction. No soft tissue air or radiopaque foreign body evident.  There are posterior and inferior calcaneal spurs. IMPRESSION: Areas of osteoarthritic change. Calcaneal spurs. No fracture or dislocation. No erosive change or bony destruction. No radiopaque foreign body. No soft tissue air to suggest abscess seen by radiography. Electronically Signed   By: Lowella Grip III M.D.   On: 12/24/2016 10:02     CBC  Recent Labs Lab 01/04/17 1121 01/05/17 0610 01/07/17 0500 01/08/17 0520 01/09/17 0458 01/10/17 1205  WBC 20.6* 16.4* 18.4* 20.9* 22.1* 27.2*  HGB 10.8* 10.9* 10.0* 10.6* 10.3* 10.6*  HCT 32.3* 31.7* 29.5* 31.4* 30.8* 31.3*  PLT 389 339 331 393 371 390  MCV 88.2 89.3 90.0 90.0 90.9 90.6  MCH 29.5 30.7 30.5 30.2 30.3 30.6  MCHC 33.4 34.4 33.8 33.6 33.4 33.8  RDW 12.6 12.5 12.6 12.8 12.6 13.0  LYMPHSABS 1.1  --   --   --  1.9 3.1  MONOABS 1.0*  --   --   --  1.3* 1.7*  EOSABS 0.5  --   --   --  0.6 0.3  BASOSABS 0.1  --   --   --  0.1 0.1    Chemistries   Recent Labs Lab 01/07/17 0500 01/08/17 0520  NA  --  136  K  --  4.7  CL  --  98*  CO2  --  32  GLUCOSE  --  334*  BUN  --  16  CREATININE 0.78 0.92  CALCIUM  --  8.3*   ------------------------------------------------------------------------------------------------------------------ estimated creatinine clearance is 80.8 mL/min (by C-G formula based on SCr  of 0.92 mg/dL). ------------------------------------------------------------------------------------------------------------------ No results for input(s): HGBA1C in the last 72 hours. ------------------------------------------------------------------------------------------------------------------ No results for input(s): CHOL, HDL, LDLCALC, TRIG, CHOLHDL, LDLDIRECT in the last 72 hours. ------------------------------------------------------------------------------------------------------------------ No results for input(s): TSH, T4TOTAL, T3FREE, THYROIDAB in the last 72 hours.  Invalid input(s): FREET3 ------------------------------------------------------------------------------------------------------------------ No results for input(s): VITAMINB12, FOLATE, FERRITIN, TIBC, IRON, RETICCTPCT in the last 72 hours.  Coagulation profile No results for input(s): INR, PROTIME in the last 168 hours.  No results for input(s): DDIMER in the last 72 hours.  Cardiac Enzymes No results for input(s): CKMB, TROPONINI, MYOGLOBIN in the last 168 hours.  Invalid input(s): CK ------------------------------------------------------------------------------------------------------------------ Invalid input(s): Gordon  Patient with left foot infection  *Sepsis with hypotension blood pressure now improved  Due to low blood pressure after procedure had to be started on midrodrine  *Left foot diabetic ulcer with cellulitis. Possible abscess. Sepsis present on admission. Status post incision and drainage   Antibiotics as per ID will need prolonged antibiotics Status post surgery  WBCs rising, need further decision by podiarty  may need repeated dressing change and evaluation by podiatry.  * Insulin dependent diabetes mellitus.  Continue insulin therapy Increased levemir for better control.  *Hypotension due to sepsis currently stable  * Acute kidney injury due to sepsis.  Resolved with IV fluid   Monitor with vanc.  *Allergic reaction to antibiotics was on prednisone and Benadryl , Atarax as needed   stop now.  * DVT prophylaxis. Start patient on Lovenox for DVT prophylaxis     Code Status Orders        Start     Ordered   12/31/16 1131  Full code  Continuous     12/31/16 1132    Code Status History    Date Active Date Inactive Code Status Order ID Comments User Context   This patient has a current code status but no historical  code status.     Consults podiatry  DVT Prophylaxis  Lovenox   Lab Results  Component Value Date   PLT 390 01/10/2017     Time Spent in minutes   35 min  Greater than 50% of time spent in care coordination and counseling patient regarding the condition and plan of care.  Vaughan Basta M.D on 01/10/2017 at 2:28 PM  Between 7am to 6pm - Pager - [phone]  After 6pm go to [email or web address] - password [email or web address] [facility]   Office  [phone]

## 2017-01-10 NOTE — Care Management (Addendum)
Chart reviewed. I & D 06/24 for continued left foot infection with abscess. Plan remains at this time for home with spouse and IV antibiotics. Wound vac has been discontinued. Case discussed with attending. Patient may be stable for discharge in the next 1-2 days.  Following progression.

## 2017-01-10 NOTE — Progress Notes (Signed)
1 Day Post-Op   Subjective/Chief Complaint: Patient seen. Overall she is feeling fine. Has some increased sensation in her left great toe. Denies any fever or chills   Objective: Vital signs in last 24 hours: Temp:  [97.4 F (36.3 C)-98.4 F (36.9 C)] 98.2 F (36.8 C) (06/25 1122) Pulse Rate:  [82-102] 94 (06/25 1122) Resp:  [6-19] 18 (06/25 1122) BP: (88-126)/(41-75) 96/51 (06/25 1122) SpO2:  [91 %-100 %] 94 % (06/25 1122) Weight:  [89.4 kg (197 lb)] 89.4 kg (197 lb) (06/25 0500) Last BM Date: 01/09/17  Intake/Output from previous day: 06/24 0701 - 06/25 0700 In: 950 [I.V.:950] Out: -  Intake/Output this shift: Total I/O In: 120 [P.O.:120] Out: -   The bandages dry and intact. Wound was not assessed today. White blood cell count was 27,000, significantly elevated still. Trending upward still  Lab Results:   Recent Labs  01/09/17 0458 01/10/17 1205  WBC 22.1* 27.2*  HGB 10.3* 10.6*  HCT 30.8* 31.3*  PLT 371 390   BMET  Recent Labs  01/08/17 0520  NA 136  K 4.7  CL 98*  CO2 32  GLUCOSE 334*  BUN 16  CREATININE 0.92  CALCIUM 8.3*   PT/INR No results for input(s): LABPROT, INR in the last 72 hours. ABG No results for input(s): PHART, HCO3 in the last 72 hours.  Invalid input(s): PCO2, PO2  Studies/Results: No results found.  Anti-infectives: Anti-infectives    Start     Dose/Rate Route Frequency Ordered Stop   01/08/17 1600  ciprofloxacin (CIPRO) tablet 500 mg     500 mg Oral 2 times daily 01/08/17 1450     01/08/17 1000  vancomycin (VANCOCIN) IVPB 1000 mg/200 mL premix     1,000 mg 200 mL/hr over 60 Minutes Intravenous Every 12 hours 01/08/17 0844     01/06/17 2200  vancomycin (VANCOCIN) IVPB 1000 mg/200 mL premix  Status:  Discontinued     1,000 mg 200 mL/hr over 60 Minutes Intravenous Every 12 hours 01/06/17 1500 01/08/17 0844   01/06/17 1530  vancomycin (VANCOCIN) IVPB 1000 mg/200 mL premix     1,000 mg 200 mL/hr over 60 Minutes  Intravenous  Once 01/06/17 1442 01/06/17 1739   01/03/17 1500  cefTRIAXone (ROCEPHIN) 2 g in dextrose 5 % 50 mL IVPB  Status:  Discontinued     2 g 100 mL/hr over 30 Minutes Intravenous Every 24 hours 01/03/17 1414 01/06/17 1432   01/03/17 1415  metroNIDAZOLE (FLAGYL) tablet 500 mg     500 mg Oral Every 8 hours 01/03/17 1414     01/03/17 1400  meropenem (MERREM) 1 g in sodium chloride 0.9 % 100 mL IVPB  Status:  Discontinued     1 g 200 mL/hr over 30 Minutes Intravenous Every 8 hours 01/03/17 1030 01/03/17 1414   12/31/16 1800  meropenem (MERREM) 1 g in sodium chloride 0.9 % 100 mL IVPB  Status:  Discontinued     1 g 200 mL/hr over 30 Minutes Intravenous Every 12 hours 12/31/16 1204 01/03/17 1030   12/31/16 1630  vancomycin (VANCOCIN) 1,250 mg in sodium chloride 0.9 % 250 mL IVPB  Status:  Discontinued     1,250 mg 166.7 mL/hr over 90 Minutes Intravenous Every 24 hours 12/31/16 1213 01/03/17 1414   12/31/16 1600  vancomycin (VANCOCIN) 1,250 mg in sodium chloride 0.9 % 250 mL IVPB  Status:  Discontinued     1,250 mg 166.7 mL/hr over 90 Minutes Intravenous Every 24 hours 12/31/16 1204  12/31/16 1213   12/31/16 1045  aztreonam (AZACTAM) 2 g in dextrose 5 % 50 mL IVPB     2 g 100 mL/hr over 30 Minutes Intravenous  Once 12/31/16 1022 12/31/16 1113   12/31/16 1030  metroNIDAZOLE (FLAGYL) IVPB 500 mg     500 mg 100 mL/hr over 60 Minutes Intravenous  Once 12/31/16 1022 12/31/16 1200   12/31/16 1030  vancomycin (VANCOCIN) IVPB 1000 mg/200 mL premix     1,000 mg 200 mL/hr over 60 Minutes Intravenous  Once 12/31/16 1022 12/31/16 1217      Assessment/Plan: s/p Procedure(s): IRRIGATION AND DEBRIDEMENT EXTREMITY (Left) Assessment: Status post I&D abscess left foot with continued elevated white count   Plan: Dressing left intact. We'll plan for a dressing change tomorrow. Repeat CBC tomorrow and hopefully start to see some progress. We will monitor the wound every couple of days for clinical  progress  LOS: 10 days    [name] 01/10/2017

## 2017-01-10 NOTE — Progress Notes (Signed)
Inpatient Diabetes Program Recommendations  AACE/ADA: New Consensus Statement on Inpatient Glycemic Control (2015)  Target Ranges:  Prepandial:   less than 140 mg/dL      Peak postprandial:   less than 180 mg/dL (1-2 hours)      Critically ill patients:  140 - 180 mg/dL   Results for YUKA, LALLIER (MRN [id number]) as of 01/10/2017 11:25  Ref. Range 01/09/2017 07:26 01/09/2017 11:36 01/09/2017 16:39 01/09/2017 19:24 01/09/2017 21:03 01/10/2017 07:39  Glucose-Capillary Latest Ref Range: 65 - 99 mg/dL 284 (H) 213 (H) 130 (H) 123 (H) 246 (H) 378 (H)   Review of Glycemic Control  Diabetes history: DM2 Outpatient Diabetes medications: None listed; pt was taking Lantus in the past but not able to afford Current orders for Inpatient glycemic control: Levemir 35 units QHS, Novolog 8 units TID with meals, Novolog 0-15 units TID with meals, Novolog 0-5 units QHS  Inpatient Diabetes Program Recommendations: Insulin - Basal: Note fasting glucose 378 mg/dl on 01/10/17 and patient received one time dose of Decadron 10 mg on 01/09/17 at 18:12 which is contributing to hyperglycemia. No other steroids ordered at this time. Do not recommend any changes with Levemir at this time.   Discharge plan for DM control:  Note patient has no insurance and will need medication assistance at time of discharge (CM aware and following). Due to cost of Levemir and Novolog, MD may want to consider discharging on NOVOLIN 70/30 (on eqivalent dosages being used as an inpatient if glycemic control trending well).    Thanks, Barnie Alderman, RN, MSN, CDE Diabetes Coordinator Inpatient Diabetes Program [phone] (Team Pager from 8am to 5pm)

## 2017-01-11 LAB — GLUCOSE, CAPILLARY
GLUCOSE-CAPILLARY: 224 mg/dL — AB (ref 65–99)
GLUCOSE-CAPILLARY: 169 mg/dL — AB (ref 65–99)
Glucose-Capillary: 189 mg/dL — ABNORMAL HIGH (ref 65–99)
Glucose-Capillary: 255 mg/dL — ABNORMAL HIGH (ref 65–99)

## 2017-01-11 LAB — BASIC METABOLIC PANEL
Anion gap: 6 (ref 5–15)
BUN: 20 mg/dL (ref 6–20)
CO2: 36 mmol/L — ABNORMAL HIGH (ref 22–32)
CREATININE: 0.92 mg/dL (ref 0.44–1.00)
Calcium: 8.1 mg/dL — ABNORMAL LOW (ref 8.9–10.3)
Chloride: 95 mmol/L — ABNORMAL LOW (ref 101–111)
Glucose, Bld: 232 mg/dL — ABNORMAL HIGH (ref 65–99)
Potassium: 4.6 mmol/L (ref 3.5–5.1)
SODIUM: 137 mmol/L (ref 135–145)

## 2017-01-11 LAB — CBC
HCT: 32 % — ABNORMAL LOW (ref 35.0–47.0)
Hemoglobin: 10.7 g/dL — ABNORMAL LOW (ref 12.0–16.0)
MCH: 30.7 pg (ref 26.0–34.0)
MCHC: 33.4 g/dL (ref 32.0–36.0)
MCV: 91.9 fL (ref 80.0–100.0)
PLATELETS: 337 10*3/uL (ref 150–440)
RBC: 3.48 MIL/uL — AB (ref 3.80–5.20)
RDW: 13.2 % (ref 11.5–14.5)
WBC: 19.6 10*3/uL — ABNORMAL HIGH (ref 3.6–11.0)

## 2017-01-11 MED ORDER — INSULIN ASPART 100 UNIT/ML ~~LOC~~ SOLN
12.0000 [IU] | Freq: Three times a day (TID) | SUBCUTANEOUS | Status: DC
  Administered 2017-01-12 – 2017-01-13 (×4): 12 [IU] via SUBCUTANEOUS
  Filled 2017-01-11 (×5): qty 1

## 2017-01-11 MED ORDER — INSULIN DETEMIR 100 UNIT/ML ~~LOC~~ SOLN
40.0000 [IU] | Freq: Every day | SUBCUTANEOUS | Status: DC
  Administered 2017-01-11 – 2017-01-12 (×2): 40 [IU] via SUBCUTANEOUS
  Filled 2017-01-11 (×3): qty 0.4

## 2017-01-11 MED ORDER — INSULIN ASPART 100 UNIT/ML ~~LOC~~ SOLN
5.0000 [IU] | Freq: Once | SUBCUTANEOUS | Status: AC
  Administered 2017-01-11: 5 [IU] via SUBCUTANEOUS
  Filled 2017-01-11: qty 1

## 2017-01-11 NOTE — Progress Notes (Signed)
2 Days Post-Op   Subjective/Chief Complaint: Patient seen. Overall feeling fairly good.   Objective: Vital signs in last 24 hours: Temp:  [98.2 F (36.8 C)-99 F (37.2 C)] 98.2 F (36.8 C) (06/26 0745) Pulse Rate:  [92-94] 92 (06/25 2248) Resp:  [18] 18 (06/26 0745) BP: (90-114)/(44-57) 90/44 (06/26 0745) SpO2:  [96 %-99 %] 99 % (06/26 0745) Last BM Date: 01/10/17  Intake/Output from previous day: 06/25 0701 - 06/26 0700 In: 600 [P.O.:600] Out: -  Intake/Output this shift: No intake/output data recorded.  Moderate to heavy bleeding on the bandaging. Upon removal the dorsal incision is well coapted with some mild bloody and mildly purulent drainage from the distal aspect. Plantar incisions are well coapted with Mepitel and beads intact. Significant reduction in the edema and erythema in the foot and leg.  Lab Results:   Recent Labs  01/10/17 1205 01/11/17 0457  WBC 27.2* 19.6*  HGB 10.6* 10.7*  HCT 31.3* 32.0*  PLT 390 337   BMET  Recent Labs  01/11/17 0457  NA 137  K 4.6  CL 95*  CO2 36*  GLUCOSE 232*  BUN 20  CREATININE 0.92  CALCIUM 8.1*   PT/INR No results for input(s): LABPROT, INR in the last 72 hours. ABG No results for input(s): PHART, HCO3 in the last 72 hours.  Invalid input(s): PCO2, PO2  Studies/Results: No results found.  Anti-infectives: Anti-infectives    Start     Dose/Rate Route Frequency Ordered Stop   01/08/17 1600  ciprofloxacin (CIPRO) tablet 500 mg     500 mg Oral 2 times daily 01/08/17 1450     01/08/17 1000  vancomycin (VANCOCIN) IVPB 1000 mg/200 mL premix     1,000 mg 200 mL/hr over 60 Minutes Intravenous Every 12 hours 01/08/17 0844     01/06/17 2200  vancomycin (VANCOCIN) IVPB 1000 mg/200 mL premix  Status:  Discontinued     1,000 mg 200 mL/hr over 60 Minutes Intravenous Every 12 hours 01/06/17 1500 01/08/17 0844   01/06/17 1530  vancomycin (VANCOCIN) IVPB 1000 mg/200 mL premix     1,000 mg 200 mL/hr over 60 Minutes  Intravenous  Once 01/06/17 1442 01/06/17 1739   01/03/17 1500  cefTRIAXone (ROCEPHIN) 2 g in dextrose 5 % 50 mL IVPB  Status:  Discontinued     2 g 100 mL/hr over 30 Minutes Intravenous Every 24 hours 01/03/17 1414 01/06/17 1432   01/03/17 1415  metroNIDAZOLE (FLAGYL) tablet 500 mg     500 mg Oral Every 8 hours 01/03/17 1414     01/03/17 1400  meropenem (MERREM) 1 g in sodium chloride 0.9 % 100 mL IVPB  Status:  Discontinued     1 g 200 mL/hr over 30 Minutes Intravenous Every 8 hours 01/03/17 1030 01/03/17 1414   12/31/16 1800  meropenem (MERREM) 1 g in sodium chloride 0.9 % 100 mL IVPB  Status:  Discontinued     1 g 200 mL/hr over 30 Minutes Intravenous Every 12 hours 12/31/16 1204 01/03/17 1030   12/31/16 1630  vancomycin (VANCOCIN) 1,250 mg in sodium chloride 0.9 % 250 mL IVPB  Status:  Discontinued     1,250 mg 166.7 mL/hr over 90 Minutes Intravenous Every 24 hours 12/31/16 1213 01/03/17 1414   12/31/16 1600  vancomycin (VANCOCIN) 1,250 mg in sodium chloride 0.9 % 250 mL IVPB  Status:  Discontinued     1,250 mg 166.7 mL/hr over 90 Minutes Intravenous Every 24 hours 12/31/16 1204 12/31/16 1213  12/31/16 1045  aztreonam (AZACTAM) 2 g in dextrose 5 % 50 mL IVPB     2 g 100 mL/hr over 30 Minutes Intravenous  Once 12/31/16 1022 12/31/16 1113   12/31/16 1030  metroNIDAZOLE (FLAGYL) IVPB 500 mg     500 mg 100 mL/hr over 60 Minutes Intravenous  Once 12/31/16 1022 12/31/16 1200   12/31/16 1030  vancomycin (VANCOCIN) IVPB 1000 mg/200 mL premix     1,000 mg 200 mL/hr over 60 Minutes Intravenous  Once 12/31/16 1022 12/31/16 1217      Assessment/Plan: s/p Procedure(s): IRRIGATION AND DEBRIDEMENT EXTREMITY (Left) Assessment: Stable status post multiple debridements left foot.   Plan: Sterile saline wet-to-dry gauze packed into the distal dorsal incision and the small open area beneath the third metatarsal. Dry sterile bandage applied to the remainder of the foot followed by Kerlix and an  Ace. Repeat CBC tomorrow and if continued improvement hopefully we can try to get the patient's home or to skilled nursing by the end of the week  LOS: 11 days    [name] 01/11/2017

## 2017-01-11 NOTE — Progress Notes (Signed)
[place] at [facility]                                                                                                                                                                                  Patient Demographics   [place], is a 51 y.o. female, DOB - April 20, 1966, SWF:[id number]  Admit date - 12/31/2016   Admitting Physician Hillary Bow, MD  Outpatient Primary MD for the patient is Patient, No Pcp Per   LOS - 11  Subjective: Patient complains Pain in her leg is better. Some sensation on left toes.  Review of Systems:   CONSTITUTIONAL: Hypothermia yest. No fatigue, weakness. No weight gain, no weight loss.  EYES: No blurry or double vision.  ENT: No tinnitus. No postnasal drip. No redness of the oropharynx.  RESPIRATORY: No cough, no wheeze, no hemoptysis. No dyspnea.  CARDIOVASCULAR: No chest pain. No orthopnea. No palpitations. No syncope.  GASTROINTESTINAL: Positive nausea, no vomiting or diarrhea. No abdominal pain. No melena or hematochezia.  GENITOURINARY: No dysuria or hematuria.  ENDOCRINE: No polyuria or nocturia. No heat or cold intolerance.  HEMATOLOGY: No anemia. No bruising. No bleeding.  INTEGUMENTARY: Positive rashes. No lesions. Positive itching  MUSCULOSKELETAL: No arthritis. No swelling. No gout.  NEUROLOGIC: No numbness, tingling, or ataxia. No seizure-type activity.  PSYCHIATRIC: No anxiety. No insomnia. No ADD.    Vitals:   Vitals:   01/10/17 1544 01/10/17 2248 01/11/17 0745 01/11/17 1318  BP: (!) 104/48 (!) 114/57 (!) 90/44 (!) 117/54  Pulse: 94 92  94  Resp:   18   Temp:  99 F (37.2 C) 98.2 F (36.8 C)   TempSrc:  Oral Oral   SpO2: 99% 96% 99%   Weight:      Height:        Wt Readings from Last 3 Encounters:  01/10/17 89.4 kg (197 lb)  09/09/16 71.7 kg (158 lb)  12/25/15 72.6 kg (160 lb)     Intake/Output Summary (Last 24 hours) at 01/11/17 1444 Last data filed at 01/11/17 1339  Gross per 24 hour   Intake              480 ml  Output                0 ml  Net              480 ml    Physical Exam:   GENERAL: Pleasant-appearing in no apparent distress.  HEAD, EYES, EARS, NOSE AND THROAT: Atraumatic, normocephalic. Extraocular muscles are intact. Pupils equal and reactive to light. Sclerae anicteric. No conjunctival injection. No oro-pharyngeal erythema.  NECK: Supple. There is  no jugular venous distention. No bruits, no lymphadenopathy, no thyromegaly.  HEART: Regular rate and rhythm,. No murmurs, no rubs, no clicks.  LUNGS: Clear to auscultation bilaterally. No rales or rhonchi. No wheezes.  ABDOMEN: Soft, flat, nontender, nondistended. Has good bowel sounds. No hepatosplenomegaly appreciated.  EXTREMITIES: Left foot dressing in place positive edema of the extremity NEUROLOGIC: The patient is alert, awake, and oriented x3 with no focal motor or sensory deficits appreciated bilaterally.  SKIN:  No skin rash. Psych: Not anxious, depressed LN: No inguinal LN enlargement    Antibiotics   Anti-infectives    Start     Dose/Rate Route Frequency Ordered Stop   01/08/17 1600  ciprofloxacin (CIPRO) tablet 500 mg     500 mg Oral 2 times daily 01/08/17 1450     01/08/17 1000  vancomycin (VANCOCIN) IVPB 1000 mg/200 mL premix     1,000 mg 200 mL/hr over 60 Minutes Intravenous Every 12 hours 01/08/17 0844     01/06/17 2200  vancomycin (VANCOCIN) IVPB 1000 mg/200 mL premix  Status:  Discontinued     1,000 mg 200 mL/hr over 60 Minutes Intravenous Every 12 hours 01/06/17 1500 01/08/17 0844   01/06/17 1530  vancomycin (VANCOCIN) IVPB 1000 mg/200 mL premix     1,000 mg 200 mL/hr over 60 Minutes Intravenous  Once 01/06/17 1442 01/06/17 1739   01/03/17 1500  cefTRIAXone (ROCEPHIN) 2 g in dextrose 5 % 50 mL IVPB  Status:  Discontinued     2 g 100 mL/hr over 30 Minutes Intravenous Every 24 hours 01/03/17 1414 01/06/17 1432   01/03/17 1415  metroNIDAZOLE (FLAGYL) tablet 500 mg     500 mg Oral  Every 8 hours 01/03/17 1414     01/03/17 1400  meropenem (MERREM) 1 g in sodium chloride 0.9 % 100 mL IVPB  Status:  Discontinued     1 g 200 mL/hr over 30 Minutes Intravenous Every 8 hours 01/03/17 1030 01/03/17 1414   12/31/16 1800  meropenem (MERREM) 1 g in sodium chloride 0.9 % 100 mL IVPB  Status:  Discontinued     1 g 200 mL/hr over 30 Minutes Intravenous Every 12 hours 12/31/16 1204 01/03/17 1030   12/31/16 1630  vancomycin (VANCOCIN) 1,250 mg in sodium chloride 0.9 % 250 mL IVPB  Status:  Discontinued     1,250 mg 166.7 mL/hr over 90 Minutes Intravenous Every 24 hours 12/31/16 1213 01/03/17 1414   12/31/16 1600  vancomycin (VANCOCIN) 1,250 mg in sodium chloride 0.9 % 250 mL IVPB  Status:  Discontinued     1,250 mg 166.7 mL/hr over 90 Minutes Intravenous Every 24 hours 12/31/16 1204 12/31/16 1213   12/31/16 1045  aztreonam (AZACTAM) 2 g in dextrose 5 % 50 mL IVPB     2 g 100 mL/hr over 30 Minutes Intravenous  Once 12/31/16 1022 12/31/16 1113   12/31/16 1030  metroNIDAZOLE (FLAGYL) IVPB 500 mg     500 mg 100 mL/hr over 60 Minutes Intravenous  Once 12/31/16 1022 12/31/16 1200   12/31/16 1030  vancomycin (VANCOCIN) IVPB 1000 mg/200 mL premix     1,000 mg 200 mL/hr over 60 Minutes Intravenous  Once 12/31/16 1022 12/31/16 1217      Medications   Scheduled Meds: . ciprofloxacin  500 mg Oral BID  . docusate sodium  100 mg Oral BID  . enoxaparin (LOVENOX) injection  40 mg Subcutaneous Q24H  . insulin aspart  0-15 Units Subcutaneous TID WC  . insulin aspart  0-5  Units Subcutaneous QHS  . insulin aspart  8 Units Subcutaneous TID WC  . insulin detemir  35 Units Subcutaneous QHS  . metroNIDAZOLE  500 mg Oral Q8H  . midodrine  5 mg Oral TID WC  . multivitamin with minerals  1 tablet Oral Daily  . polyethylene glycol  17 g Oral Daily  . protein supplement shake  11 oz Oral Q24H  . senna  1 tablet Oral Daily  . sodium chloride flush  10-40 mL Intracatheter Q12H   Continuous  Infusions: . vancomycin Stopped (01/11/17 0931)   PRN Meds:.acetaminophen **OR** acetaminophen, albuterol, hydrOXYzine, morphine injection, ondansetron **OR** ondansetron (ZOFRAN) IV, oxyCODONE, polyethylene glycol, sodium chloride flush   Data Review:   Micro Results Recent Results (from the past 240 hour(s))  CULTURE, BLOOD (ROUTINE X 2) w Reflex to ID Panel     Status: None   Collection Time: 01/02/17  1:15 PM  Result Value Ref Range Status   Specimen Description BLOOD RIGHT ANTECUBITAL  Final   Special Requests   Final    BOTTLES DRAWN AEROBIC AND ANAEROBIC Blood Culture adequate volume   Culture NO GROWTH 5 DAYS  Final   Report Status 01/07/2017 FINAL  Final  CULTURE, BLOOD (ROUTINE X 2) w Reflex to ID Panel     Status: None   Collection Time: 01/02/17  1:15 PM  Result Value Ref Range Status   Specimen Description BLOOD BLOOD RIGHT FOREARM  Final   Special Requests   Final    BOTTLES DRAWN AEROBIC AND ANAEROBIC Blood Culture adequate volume   Culture NO GROWTH 5 DAYS  Final   Report Status 01/07/2017 FINAL  Final  Aerobic/Anaerobic Culture (surgical/deep wound)     Status: Abnormal (Preliminary result)   Collection Time: 01/06/17 11:06 AM  Result Value Ref Range Status   Specimen Description   Final    ABSCESS DEEP ABSCESS FROM 1ST AND 2ND METATARSAL REGION   Special Requests NONE  Final   Gram Stain   Final    ABUNDANT WBC PRESENT, PREDOMINANTLY PMN FEW GRAM POSITIVE COCCI IN PAIRS FEW GRAM POSITIVE RODS RARE GRAM NEGATIVE RODS    Culture (A)  Final    MULTIPLE ORGANISMS PRESENT, NONE PREDOMINANT HOLDING FOR POSSIBLE ANAEROBE Performed at [facility], [place] [address] [place],  [address]    Report Status PENDING  Incomplete    Radiology Reports Mr Foot Left Wo Contrast  Result Date: 01/06/2017 CLINICAL DATA:  Status post surgical debridement of a forefoot abscess. Surgery date 12/31/2016 EXAM: MRI OF THE LEFT FOOT WITHOUT CONTRAST TECHNIQUE:  Multiplanar, multisequence MR imaging of the left foot was performed. No intravenous contrast was administered. COMPARISON:  Left foot radiographs 12/31/2016 FINDINGS: Large postoperative surgical defect/open wound involving the plantar aspect of the forefoot. No subcutaneous fluid collection to suggest an abscess. There is diffuse and fairly severe myofasciitis involving the forefoot musculature. No definite findings for pyomyositis. There is a 2.9 x 2.7 x 2.0 cm fluid collection surrounding the mid distal aspect of the second metatarsal. This is worrisome for an abscess. No definite signal abnormality in the second metatarsal to suggest osteomyelitis. No joint effusion or destructive changes to suggest septic arthritis. STIR signal abnormality in the third and fourth metatarsal heads and proximal phalanges. This could be reactive or arthropathic changes. I do not see any obvious joint effusions to suggest septic arthritis but given the adjacent abscess certainly could not exclude septic arthritis and osteomyelitis at these joints. There is also  an open wound/surgical debridement changes along the plantar aspect of the third metatarsal phalangeal joint. IMPRESSION: 1. Postoperative change with a large open wound/surgical debridement site involving the plantar aspect of the forefoot. 2. 2.9 x 2.7 x 2.0 cm fluid collections surrounding the mid distal aspect of the second metatarsal is worrisome for a persistent or recurrent abscess. 3. Signal abnormality in the third and fourth metatarsal heads and proximal phalanges. This could be reactive or arthropathic changes. Although I do not see any obvious joint effusion could not exclude the possibility of septic arthritis and osteomyelitis particularly with a large adjacent open wound. 4. Cellulitis and myofasciitis. Electronically Signed   By: Marijo Sanes M.D.   On: 01/06/2017 08:25   Dg Foot Complete Left  Result Date: 12/31/2016 CLINICAL DATA:  Diabetic ulcer on  the bottom of the foot over the medial aspect of the great toe. EXAM: LEFT FOOT - COMPLETE 3+ VIEW COMPARISON:  Left foot series of December 24, 2016 FINDINGS: The bones are subjectively mildly osteopenic. No lytic or blastic bony lesions are observed. Subtle osteoarthritic changes are noted of the first MTP joint and of the fifth MTP joint. There are plantar and Achilles region calcaneal spurs. There is small amount of soft tissue gas in the interspace between the first and second toes. There is gas over the base of the proximal phalanx of the great toe. There is mild soft tissue swelling over the dorsum of the foot with arterial calcification noted. IMPRESSION: Findings compatible with a known foot ulcer over the base of the great toe. No bony changes of osteomyelitis are observed. There are mild osteoarthritic changes elsewhere and there are plantar and Achilles region calcaneal spurs. Electronically Signed   By: David  Martinique M.D.   On: 12/31/2016 10:41   Dg Foot Complete Left  Result Date: 12/24/2016 CLINICAL DATA:  Diabetic ulcer with pain EXAM: LEFT FOOT - COMPLETE 3+ VIEW COMPARISON:  September 09, 2016 FINDINGS: Frontal, oblique, and lateral views were obtained. There is no evident fracture or dislocation. There is osteoarthritic change in the first MTP joint. There is also mild spurring in the dorsal midfoot. No erosive change or bony destruction. No soft tissue air or radiopaque foreign body evident. There are posterior and inferior calcaneal spurs. IMPRESSION: Areas of osteoarthritic change. Calcaneal spurs. No fracture or dislocation. No erosive change or bony destruction. No radiopaque foreign body. No soft tissue air to suggest abscess seen by radiography. Electronically Signed   By: Lowella Grip III M.D.   On: 12/24/2016 10:02     CBC  Recent Labs Lab 01/07/17 0500 01/08/17 0520 01/09/17 0458 01/10/17 1205 01/11/17 0457  WBC 18.4* 20.9* 22.1* 27.2* 19.6*  HGB 10.0* 10.6* 10.3* 10.6*  10.7*  HCT 29.5* 31.4* 30.8* 31.3* 32.0*  PLT 331 393 371 390 337  MCV 90.0 90.0 90.9 90.6 91.9  MCH 30.5 30.2 30.3 30.6 30.7  MCHC 33.8 33.6 33.4 33.8 33.4  RDW 12.6 12.8 12.6 13.0 13.2  LYMPHSABS  --   --  1.9 3.1  --   MONOABS  --   --  1.3* 1.7*  --   EOSABS  --   --  0.6 0.3  --   BASOSABS  --   --  0.1 0.1  --     Chemistries   Recent Labs Lab 01/07/17 0500 01/08/17 0520 01/11/17 0457  NA  --  136 137  K  --  4.7 4.6  CL  --  98* 95*  CO2  --  32 36*  GLUCOSE  --  334* 232*  BUN  --  16 20  CREATININE 0.78 0.92 0.92  CALCIUM  --  8.3* 8.1*   ------------------------------------------------------------------------------------------------------------------ estimated creatinine clearance is 80.8 mL/min (by C-G formula based on SCr of 0.92 mg/dL). ------------------------------------------------------------------------------------------------------------------ No results for input(s): HGBA1C in the last 72 hours. ------------------------------------------------------------------------------------------------------------------ No results for input(s): CHOL, HDL, LDLCALC, TRIG, CHOLHDL, LDLDIRECT in the last 72 hours. ------------------------------------------------------------------------------------------------------------------ No results for input(s): TSH, T4TOTAL, T3FREE, THYROIDAB in the last 72 hours.  Invalid input(s): FREET3 ------------------------------------------------------------------------------------------------------------------ No results for input(s): VITAMINB12, FOLATE, FERRITIN, TIBC, IRON, RETICCTPCT in the last 72 hours.  Coagulation profile No results for input(s): INR, PROTIME in the last 168 hours.  No results for input(s): DDIMER in the last 72 hours.  Cardiac Enzymes No results for input(s): CKMB, TROPONINI, MYOGLOBIN in the last 168 hours.  Invalid input(s):  CK ------------------------------------------------------------------------------------------------------------------ Invalid input(s): [place]  Patient with left foot infection  *Sepsis with hypotension blood pressure now improved  Due to low blood pressure after procedure had to be started on midrodrine  *Left foot diabetic ulcer with cellulitis. Possible abscess. Sepsis present on admission. Status post incision and drainage   Antibiotics as per ID will need prolonged antibiotics Status post surgery  WBCs started coming down now, monitor for 1-2 days and then d/c as per podiatry.  may need repeated dressing change and evaluation by podiatry.  will get PT eval.  * Insulin dependent diabetes mellitus.  Continue insulin therapy Increased levemir for better control.  *Hypotension due to sepsis currently stable  * Acute kidney injury due to sepsis. Resolved with IV fluid   Monitor with vanc.  *Allergic reaction to antibiotics was on prednisone and Benadryl , Atarax as needed   stop now.  * DVT prophylaxis. Start patient on Lovenox for DVT prophylaxis     Code Status Orders        Start     Ordered   12/31/16 1131  Full code  Continuous     12/31/16 1132    Code Status History    Date Active Date Inactive Code Status Order ID Comments User Context   This patient has a current code status but no historical code status.     Consults podiatry  DVT Prophylaxis  Lovenox   Lab Results  Component Value Date   PLT 337 01/11/2017     Time Spent in minutes   35 min  Greater than 50% of time spent in care coordination and counseling patient regarding the condition and plan of care.  Vaughan Basta M.D on 01/11/2017 at 2:44 PM  Between 7am to 6pm - Pager - [phone]  After 6pm go to [email or web address] - password [email or web address] [facility]   Office  [phone]

## 2017-01-11 NOTE — Progress Notes (Signed)
Inpatient Diabetes Program Recommendations  AACE/ADA: New Consensus Statement on Inpatient Glycemic Control (2015)  Target Ranges:  Prepandial:   less than 140 mg/dL      Peak postprandial:   less than 180 mg/dL (1-2 hours)      Critically ill patients:  140 - 180 mg/dL   Results for KINDELL, STRADA (MRN [id number]) as of 01/11/2017 08:01  Ref. Range 01/10/2017 07:39 01/10/2017 11:17 01/10/2017 16:37 01/10/2017 21:08 01/11/2017 07:38  Glucose-Capillary Latest Ref Range: 65 - 99 mg/dL 378 (H) 284 (H) 293 (H) 224 (H) 224 (H)   Review of Glycemic Control  Diabetes history: DM2 Outpatient Diabetes medications: None listed; pt was taking Lantus in the past but not able to afford Current orders for Inpatient glycemic control: Levemir 35 units QHS, Novolog 8 units TID with meals, Novolog 0-15 units TID with meals, Novolog 0-5 units QHS  Inpatient Diabetes Program Recommendations:  Insulin - Basal: Please consider increasing Levemir to 40 units QHS. Insulin - Meal Coverage: Please consider increasing meal coverage to Novolog 12 units TID with meals.  Discharge plan for DM control:  Note patient has no insurance and will need medication assistance at time of discharge (CM aware and following). Due to cost of Levemir and Novolog, MD may want to consider discharging on NOVOLIN 70/30 (on eqivalent dosages being used as an inpatient if glycemic control trending well).    Thanks, Barnie Alderman, RN, MSN, CDE Diabetes Coordinator Inpatient Diabetes Program [phone] (Team Pager from 8am to 5pm)

## 2017-01-11 NOTE — Progress Notes (Signed)
MD Vachhani notified pts BG is 169, pt states she is not going to eat her dinner. Per MD give one time dose of 5 units Novolog and hold scheduled 12 units and sliding scale.

## 2017-01-11 NOTE — Progress Notes (Addendum)
Pharmacy Antibiotic Note  Donna Fisher is a 51 y.o. female admitted on 12/31/2016 with wound infection.  Pharmacy has been consulted for vancomycin dosing.  Plan: Continue vancomycin 1000 mg iv q 12 hours. Will continue to follow renal function closely and check levels at least weekly or as indicated.   6/26: Scr today stable from three days ago when trough was obtained. Will order trough before AM dose tomorrow (seem to be nearing discharge).   Height: 5\' 5"  (165.1 cm) Weight: 197 lb (89.4 kg) IBW/kg (Calculated) : 57  Temp (24hrs), Avg:98.6 F (37 C), Min:98.2 F (36.8 C), Max:99 F (37.2 C)   Recent Labs Lab 01/07/17 0500 01/08/17 0520 01/08/17 0857 01/09/17 0458 01/09/17 0943 01/10/17 1205 01/11/17 0457  WBC 18.4* 20.9*  --  22.1*  --  27.2* 19.6*  CREATININE 0.78 0.92  --   --   --   --  0.92  VANCOTROUGH  --   --  16  --  17  --   --     Estimated Creatinine Clearance: 80.8 mL/min (by C-G formula based on SCr of 0.92 mg/dL).    Allergies  Allergen Reactions  . Penicillins Swelling    .Has patient had a PCN reaction causing immediate rash, facial/tongue/throat swelling, SOB or lightheadedness with hypotension: No Has patient had a PCN reaction causing severe rash involving mucus membranes or skin necrosis: No Has patient had a PCN reaction that required hospitalization: No Has patient had a PCN reaction occurring within the last 10 years: No If all of the above answers are "NO", then may proceed with Cephalosporin use.   . Codeine Itching  . Ceftriaxone Rash    Aztreonam/Metronidazole 6/15 x 1.  Meropenem 6/15 >> 6/18 Vancomycin 6/15 >> 6/18, 6/21 >> CTX 6/18 >> 6/21 Metronidazole 6/18 >> Cipro 6/23 >>   6/15 BCx x2 NG final UCx insig growth 6/17 BCx x2 NG final 6/15 WCx from OR mod group G strep, no anaerobes - final 6/21 WCx from OR pending  Thank you for allowing pharmacy to be a part of this patient's care.  Rocky Morel 01/11/2017 1:08 PM

## 2017-01-12 LAB — CBC
HCT: 30.2 % — ABNORMAL LOW (ref 35.0–47.0)
Hemoglobin: 10.2 g/dL — ABNORMAL LOW (ref 12.0–16.0)
MCH: 30.6 pg (ref 26.0–34.0)
MCHC: 33.7 g/dL (ref 32.0–36.0)
MCV: 90.7 fL (ref 80.0–100.0)
PLATELETS: 309 10*3/uL (ref 150–440)
RBC: 3.33 MIL/uL — ABNORMAL LOW (ref 3.80–5.20)
RDW: 13.3 % (ref 11.5–14.5)
WBC: 12.9 10*3/uL — AB (ref 3.6–11.0)

## 2017-01-12 LAB — AEROBIC/ANAEROBIC CULTURE W GRAM STAIN (SURGICAL/DEEP WOUND)

## 2017-01-12 LAB — AEROBIC/ANAEROBIC CULTURE (SURGICAL/DEEP WOUND)

## 2017-01-12 LAB — GLUCOSE, CAPILLARY
GLUCOSE-CAPILLARY: 291 mg/dL — AB (ref 65–99)
Glucose-Capillary: 126 mg/dL — ABNORMAL HIGH (ref 65–99)
Glucose-Capillary: 205 mg/dL — ABNORMAL HIGH (ref 65–99)
Glucose-Capillary: 245 mg/dL — ABNORMAL HIGH (ref 65–99)

## 2017-01-12 NOTE — Evaluation (Addendum)
Physical Therapy Re-Evaluation Patient Details Name: Donna Fisher MRN: [id number] DOB: Jun 19, 1966 Today's Date: 01/12/2017   History of Present Illness  Pt is a 51 y.o. female admitted to hospital 12/31/16 with L foot diabetic ulcer with cellulitis; sepsis with hypotension.  Pt s/p I&D L foot 12/31/16, 01/06/17, and 01/09/17.  Pt [place] L LE.  PMHx:  DM, neuropathy, heel and achilles spur, gas in tissues of L foot at toes.  Clinical Impression  Pt seen for PT re-evaluation (pt s/p multiple I&D's and new PT consult received).  Prior to hospital admission, pt was ambulating with assistive device.  Pt lives with her husband and daughter but pt reports her husband is not able to assist her and hopes to progress enough to go to cousin's home (is next door to her home and cousin's can help some).  Currently pt is CGA with transfers and ambulation 10 feet with RW; distance limited d/t pt quickly fatiguing (pt also reporting 4/10 L foot pain).  Pt able to maintain L LE [place] status throughout session.  Pt would benefit from skilled PT to address noted impairments and functional limitations (see below for any additional details).  Overall, pt appearing deconditioned from extended hospital stay.  Upon hospital discharge, currently recommend pt discharge to STR d/t impaired activity tolerance for household level functional mobility (pt also needs to navigate 2 stairs into home).  CM notified who reported she would update SW.  Will continue to progress pt as able (focus on progressing ambulation, pain management); also educated pt to have family bring in shoe for pt for R LE to assist with ambulation).    Follow Up Recommendations SNF    Equipment Recommendations  Rolling walker with 5" wheels    Recommendations for Other Services       Precautions / Restrictions Precautions Precautions: Fall Precaution Comments: Elevate operative extremity on 2 pillows at all times. Restrictions Weight Bearing  Restrictions: Yes LLE Weight Bearing: Non weight bearing      Mobility  Bed Mobility               General bed mobility comments: Deferred d/t pt sitting up in chair upon PT arrival.  Transfers Overall transfer level: Needs assistance Equipment used: Rolling walker (2 wheeled) Transfers: Sit to/from Stand Sit to Stand: Min guard         General transfer comment: vc's and demo for transfer technique (hand and feet placement and NWB'ing status); mild increased effort to stand  Ambulation/Gait Ambulation/Gait assistance: Min guard Ambulation Distance (Feet): 10 Feet Assistive device: Rolling walker (2 wheeled)   Gait velocity: decreased   General Gait Details: NWB L LE with good compliance; decreased R LE step length and foot clearance; fatigues quickly  Stairs            Wheelchair Mobility    Modified Rankin (Stroke Patients Only)       Balance Overall balance assessment: Needs assistance Sitting-balance support: No upper extremity supported;Feet unsupported Sitting balance-Leahy Scale: Good Sitting balance - Comments: sitting reaching within BOS   Standing balance support: Bilateral upper extremity supported (on RW) Standing balance-Leahy Scale: Fair Standing balance comment: static standing                             Pertinent Vitals/Pain Pain Assessment: 0-10 Pain Score: 5  Pain Location: L foot Pain Descriptors / Indicators: Aching;Sore Pain Intervention(s): Limited activity within patient's tolerance;Monitored during session;Repositioned (L  LE elevated via 2 pillows)  Vitals (HR and O2 on room air) stable and WFL throughout treatment session.  BP 108/42 beginning of session (no c/o dizziness during session).    Home Living Family/patient expects to be discharged to:: Private residence Living Arrangements: Spouse/significant other;Children (Plans to discharge to her cousin's house that is next door (following information is  cousin's home set-up)) Available Help at Discharge: Family Type of Home: House Home Access: Stairs to enter Entrance Stairs-Rails: Right;Left;Can reach both Technical brewer of Steps: 2 Home Layout: One level Home Equipment: Queets - single point      Prior Function Level of Independence: Independent with assistive device(s)         Comments: Pt ambulating with SPC prior to admission.     Hand Dominance        Extremity/Trunk Assessment   Upper Extremity Assessment Upper Extremity Assessment: Generalized weakness    Lower Extremity Assessment Lower Extremity Assessment: Generalized weakness (L ankle/foot not assessed d/t precautions)    Cervical / Trunk Assessment Cervical / Trunk Assessment: Normal  Communication   Communication: No difficulties  Cognition Arousal/Alertness: Awake/alert Behavior During Therapy: WFL for tasks assessed/performed Overall Cognitive Status: Within Functional Limits for tasks assessed                                        General Comments General comments (skin integrity, edema, etc.): Pt sitting up in chair upon PT arrival.  Nursing cleared pt for participation in physical therapy.  Pt agreeable to PT session.    Exercises     Assessment/Plan    PT Assessment Patient needs continued PT services  PT Problem List Decreased strength;Decreased activity tolerance;Decreased balance;Decreased mobility;Decreased knowledge of use of DME;Decreased knowledge of precautions;Pain       PT Treatment Interventions DME instruction;Gait training;Stair training;Functional mobility training;Therapeutic activities;Therapeutic exercise;Balance training;Patient/family education    PT Goals (Current goals can be found in the Care Plan section)  Acute Rehab PT Goals Patient Stated Goal: to walk and get home PT Goal Formulation: With patient Time For Goal Achievement: 01/26/17 Potential to Achieve Goals: Fair    Frequency  7X/week   Barriers to discharge Decreased caregiver support;Inaccessible home environment stairs to enter home    Co-evaluation               AM-PAC PT "6 Clicks" Daily Activity  Outcome Measure Difficulty turning over in bed (including adjusting bedclothes, sheets and blankets)?: None Difficulty moving from lying on back to sitting on the side of the bed? : None Difficulty sitting down on and standing up from a chair with arms (e.g., wheelchair, bedside commode, etc,.)?: Total Help needed moving to and from a bed to chair (including a wheelchair)?: A Little Help needed walking in hospital room?: A Lot Help needed climbing 3-5 steps with a railing? : Total 6 Click Score: 15    End of Session Equipment Utilized During Treatment: Gait belt Activity Tolerance: Patient limited by fatigue Patient left: in chair;with call bell/phone within reach;with chair alarm set (chair reclined and L LE elevated on 2 pillows above level of heart) Nurse Communication: Mobility status;Precautions;Weight bearing status PT Visit Diagnosis: Muscle weakness (generalized) (M62.81);Other abnormalities of gait and mobility (R26.89);Difficulty in walking, not elsewhere classified (R26.2);Pain Pain - Right/Left: Left Pain - part of body: Ankle and joints of foot    Time: 1430-1500 PT Time Calculation (min) (ACUTE  ONLY): 30 min   Charges:   PT Evaluation $PT Re-evaluation: 1 Procedure     PT G CodesLeitha Bleak, PT 01/12/17, 3:30 PM [phone]  Addendum:  Prior POC/goals remain appropriate. Leitha Bleak, PT 01/12/17, 3:39 PM

## 2017-01-12 NOTE — Progress Notes (Signed)
[place] [facility]                                                                                                                                                                                  Patient Demographics   [place], is a 51 y.o. female, DOB - 07-29-1965, LPF:[id number]  Admit date - 12/31/2016   Admitting Physician Hillary Bow, MD  Outpatient Primary MD for the patient is Patient, No Pcp Per   LOS - 12  Subjective: Patient complains Pain in her leg is better. Some sensation on left toes.  Review of Systems:   CONSTITUTIONAL: Hypothermia yest. No fatigue, weakness. No weight gain, no weight loss.  EYES: No blurry or double vision.  ENT: No tinnitus. No postnasal drip. No redness of the oropharynx.  RESPIRATORY: No cough, no wheeze, no hemoptysis. No dyspnea.  CARDIOVASCULAR: No chest pain. No orthopnea. No palpitations. No syncope.  GASTROINTESTINAL: Positive nausea, no vomiting or diarrhea. No abdominal pain. No melena or hematochezia.  GENITOURINARY: No dysuria or hematuria.  ENDOCRINE: No polyuria or nocturia. No heat or cold intolerance.  HEMATOLOGY: No anemia. No bruising. No bleeding.  INTEGUMENTARY: Positive rashes. No lesions. Positive itching  MUSCULOSKELETAL: No arthritis. No swelling. No gout.  NEUROLOGIC: No numbness, tingling, or ataxia. No seizure-type activity.  PSYCHIATRIC: No anxiety. No insomnia. No ADD.    Vitals:   Vitals:   01/11/17 1720 01/11/17 2348 01/12/17 0751 01/12/17 1429  BP: (!) 96/59 (!) 97/55 (!) 90/58 (!) 108/42  Pulse: 92 92 95 88  Resp: 18 18 18 16   Temp: 98.7 F (37.1 C) 98.6 F (37 C) 99.1 F (37.3 C) 98.6 F (37 C)  TempSrc: Oral Oral Oral Oral  SpO2: 96% 98% 98% 96%  Weight:      Height:        Wt Readings from Last 3 Encounters:  01/10/17 89.4 kg (197 lb)  09/09/16 71.7 kg (158 lb)  12/25/15 72.6 kg (160 lb)     Intake/Output Summary (Last 24 hours) at 01/12/17 1649 Last  data filed at 01/12/17 1358  Gross per 24 hour  Intake              880 ml  Output                0 ml  Net              880 ml    Physical Exam:   GENERAL: Pleasant-appearing in no apparent distress.  HEAD, EYES, EARS, NOSE AND THROAT: Atraumatic, normocephalic. Extraocular muscles are intact. Pupils equal and reactive to light. Sclerae anicteric. No conjunctival injection. No oro-pharyngeal  erythema.  NECK: Supple. There is no jugular venous distention. No bruits, no lymphadenopathy, no thyromegaly.  HEART: Regular rate and rhythm,. No murmurs, no rubs, no clicks.  LUNGS: Clear to auscultation bilaterally. No rales or rhonchi. No wheezes.  ABDOMEN: Soft, flat, nontender, nondistended. Has good bowel sounds. No hepatosplenomegaly appreciated.  EXTREMITIES: Left foot dressing in place positive edema of the extremity NEUROLOGIC: The patient is alert, awake, and oriented x3 with no focal motor or sensory deficits appreciated bilaterally.  SKIN:  No skin rash. Psych: Not anxious, depressed LN: No inguinal LN enlargement    Antibiotics   Anti-infectives    Start     Dose/Rate Route Frequency Ordered Stop   01/08/17 1600  ciprofloxacin (CIPRO) tablet 500 mg     500 mg Oral 2 times daily 01/08/17 1450     01/08/17 1000  vancomycin (VANCOCIN) IVPB 1000 mg/200 mL premix     1,000 mg 200 mL/hr over 60 Minutes Intravenous Every 12 hours 01/08/17 0844     01/06/17 2200  vancomycin (VANCOCIN) IVPB 1000 mg/200 mL premix  Status:  Discontinued     1,000 mg 200 mL/hr over 60 Minutes Intravenous Every 12 hours 01/06/17 1500 01/08/17 0844   01/06/17 1530  vancomycin (VANCOCIN) IVPB 1000 mg/200 mL premix     1,000 mg 200 mL/hr over 60 Minutes Intravenous  Once 01/06/17 1442 01/06/17 1739   01/03/17 1500  cefTRIAXone (ROCEPHIN) 2 g in dextrose 5 % 50 mL IVPB  Status:  Discontinued     2 g 100 mL/hr over 30 Minutes Intravenous Every 24 hours 01/03/17 1414 01/06/17 1432   01/03/17 1415   metroNIDAZOLE (FLAGYL) tablet 500 mg     500 mg Oral Every 8 hours 01/03/17 1414     01/03/17 1400  meropenem (MERREM) 1 g in sodium chloride 0.9 % 100 mL IVPB  Status:  Discontinued     1 g 200 mL/hr over 30 Minutes Intravenous Every 8 hours 01/03/17 1030 01/03/17 1414   12/31/16 1800  meropenem (MERREM) 1 g in sodium chloride 0.9 % 100 mL IVPB  Status:  Discontinued     1 g 200 mL/hr over 30 Minutes Intravenous Every 12 hours 12/31/16 1204 01/03/17 1030   12/31/16 1630  vancomycin (VANCOCIN) 1,250 mg in sodium chloride 0.9 % 250 mL IVPB  Status:  Discontinued     1,250 mg 166.7 mL/hr over 90 Minutes Intravenous Every 24 hours 12/31/16 1213 01/03/17 1414   12/31/16 1600  vancomycin (VANCOCIN) 1,250 mg in sodium chloride 0.9 % 250 mL IVPB  Status:  Discontinued     1,250 mg 166.7 mL/hr over 90 Minutes Intravenous Every 24 hours 12/31/16 1204 12/31/16 1213   12/31/16 1045  aztreonam (AZACTAM) 2 g in dextrose 5 % 50 mL IVPB     2 g 100 mL/hr over 30 Minutes Intravenous  Once 12/31/16 1022 12/31/16 1113   12/31/16 1030  metroNIDAZOLE (FLAGYL) IVPB 500 mg     500 mg 100 mL/hr over 60 Minutes Intravenous  Once 12/31/16 1022 12/31/16 1200   12/31/16 1030  vancomycin (VANCOCIN) IVPB 1000 mg/200 mL premix     1,000 mg 200 mL/hr over 60 Minutes Intravenous  Once 12/31/16 1022 12/31/16 1217      Medications   Scheduled Meds: . ciprofloxacin  500 mg Oral BID  . docusate sodium  100 mg Oral BID  . enoxaparin (LOVENOX) injection  40 mg Subcutaneous Q24H  . insulin aspart  0-15 Units Subcutaneous TID WC  .  insulin aspart  0-5 Units Subcutaneous QHS  . insulin aspart  12 Units Subcutaneous TID WC  . insulin detemir  40 Units Subcutaneous QHS  . metroNIDAZOLE  500 mg Oral Q8H  . midodrine  5 mg Oral TID WC  . multivitamin with minerals  1 tablet Oral Daily  . polyethylene glycol  17 g Oral Daily  . protein supplement shake  11 oz Oral Q24H  . senna  1 tablet Oral Daily  . sodium chloride  flush  10-40 mL Intracatheter Q12H   Continuous Infusions: . vancomycin Stopped (01/12/17 1200)   PRN Meds:.acetaminophen **OR** acetaminophen, albuterol, hydrOXYzine, morphine injection, ondansetron **OR** ondansetron (ZOFRAN) IV, oxyCODONE, polyethylene glycol, sodium chloride flush   Data Review:   Micro Results Recent Results (from the past 240 hour(s))  Aerobic/Anaerobic Culture (surgical/deep wound)     Status: Abnormal   Collection Time: 01/06/17 11:06 AM  Result Value Ref Range Status   Specimen Description   Final    ABSCESS DEEP ABSCESS FROM 1ST AND 2ND METATARSAL REGION   Special Requests NONE  Final   Gram Stain   Final    ABUNDANT WBC PRESENT, PREDOMINANTLY PMN FEW GRAM POSITIVE COCCI IN PAIRS FEW GRAM POSITIVE RODS RARE GRAM NEGATIVE RODS    Culture (A)  Final    MULTIPLE ORGANISMS PRESENT, NONE PREDOMINANT FEW BACTEROIDES THETAIOTAOMICRON BETA LACTAMASE POSITIVE Performed at [facility], [place] [address], [place], [place] [address]    Report Status 01/12/2017 FINAL  Final    Radiology Reports Mr Foot Left Wo Contrast  Result Date: 01/06/2017 CLINICAL DATA:  Status post surgical debridement of a forefoot abscess. Surgery date 12/31/2016 EXAM: MRI OF THE LEFT FOOT WITHOUT CONTRAST TECHNIQUE: Multiplanar, multisequence MR imaging of the left foot was performed. No intravenous contrast was administered. COMPARISON:  Left foot radiographs 12/31/2016 FINDINGS: Large postoperative surgical defect/open wound involving the plantar aspect of the forefoot. No subcutaneous fluid collection to suggest an abscess. There is diffuse and fairly severe myofasciitis involving the forefoot musculature. No definite findings for pyomyositis. There is a 2.9 x 2.7 x 2.0 cm fluid collection surrounding the mid distal aspect of the second metatarsal. This is worrisome for an abscess. No definite signal abnormality in the second metatarsal to suggest osteomyelitis. No joint effusion or  destructive changes to suggest septic arthritis. STIR signal abnormality in the third and fourth metatarsal heads and proximal phalanges. This could be reactive or arthropathic changes. I do not see any obvious joint effusions to suggest septic arthritis but given the adjacent abscess certainly could not exclude septic arthritis and osteomyelitis at these joints. There is also an open wound/surgical debridement changes along the plantar aspect of the third metatarsal phalangeal joint. IMPRESSION: 1. Postoperative change with a large open wound/surgical debridement site involving the plantar aspect of the forefoot. 2. 2.9 x 2.7 x 2.0 cm fluid collections surrounding the mid distal aspect of the second metatarsal is worrisome for a persistent or recurrent abscess. 3. Signal abnormality in the third and fourth metatarsal heads and proximal phalanges. This could be reactive or arthropathic changes. Although I do not see any obvious joint effusion could not exclude the possibility of septic arthritis and osteomyelitis particularly with a large adjacent open wound. 4. Cellulitis and myofasciitis. Electronically Signed   By: Marijo Sanes M.D.   On: 01/06/2017 08:25   Dg Foot Complete Left  Result Date: 12/31/2016 CLINICAL DATA:  Diabetic ulcer on the bottom of the foot over the medial aspect  of the great toe. EXAM: LEFT FOOT - COMPLETE 3+ VIEW COMPARISON:  Left foot series of December 24, 2016 FINDINGS: The bones are subjectively mildly osteopenic. No lytic or blastic bony lesions are observed. Subtle osteoarthritic changes are noted of the first MTP joint and of the fifth MTP joint. There are plantar and Achilles region calcaneal spurs. There is small amount of soft tissue gas in the interspace between the first and second toes. There is gas over the base of the proximal phalanx of the great toe. There is mild soft tissue swelling over the dorsum of the foot with arterial calcification noted. IMPRESSION: Findings  compatible with a known foot ulcer over the base of the great toe. No bony changes of osteomyelitis are observed. There are mild osteoarthritic changes elsewhere and there are plantar and Achilles region calcaneal spurs. Electronically Signed   By: David  [place]ique M.D.   On: 12/31/2016 10:41   Dg Foot Complete Left  Result Date: 12/24/2016 CLINICAL DATA:  Diabetic ulcer with pain EXAM: LEFT FOOT - COMPLETE 3+ VIEW COMPARISON:  September 09, 2016 FINDINGS: Frontal, oblique, and lateral views were obtained. There is no evident fracture or dislocation. There is osteoarthritic change in the first MTP joint. There is also mild spurring in the dorsal midfoot. No erosive change or bony destruction. No soft tissue air or radiopaque foreign body evident. There are posterior and inferior calcaneal spurs. IMPRESSION: Areas of osteoarthritic change. Calcaneal spurs. No fracture or dislocation. No erosive change or bony destruction. No radiopaque foreign body. No soft tissue air to suggest abscess seen by radiography. Electronically Signed   By: Lowella Grip III M.D.   On: 12/24/2016 10:02     CBC  Recent Labs Lab 01/08/17 0520 01/09/17 0458 01/10/17 1205 01/11/17 0457 01/12/17 0442  WBC 20.9* 22.1* 27.2* 19.6* 12.9*  HGB 10.6* 10.3* 10.6* 10.7* 10.2*  HCT 31.4* 30.8* 31.3* 32.0* 30.2*  PLT 393 371 390 337 309  MCV 90.0 90.9 90.6 91.9 90.7  MCH 30.2 30.3 30.6 30.7 30.6  MCHC 33.6 33.4 33.8 33.4 33.7  RDW 12.8 12.6 13.0 13.2 13.3  LYMPHSABS  --  1.9 3.1  --   --   MONOABS  --  1.3* 1.7*  --   --   EOSABS  --  0.6 0.3  --   --   BASOSABS  --  0.1 0.1  --   --     Chemistries   Recent Labs Lab 01/07/17 0500 01/08/17 0520 01/11/17 0457  NA  --  136 137  K  --  4.7 4.6  CL  --  98* 95*  CO2  --  32 36*  GLUCOSE  --  334* 232*  BUN  --  16 20  CREATININE 0.78 0.92 0.92  CALCIUM  --  8.3* 8.1*    ------------------------------------------------------------------------------------------------------------------ estimated creatinine clearance is 80.8 mL/min (by C-G formula based on SCr of 0.92 mg/dL). ------------------------------------------------------------------------------------------------------------------ No results for input(s): HGBA1C in the last 72 hours. ------------------------------------------------------------------------------------------------------------------ No results for input(s): CHOL, HDL, LDLCALC, TRIG, CHOLHDL, LDLDIRECT in the last 72 hours. ------------------------------------------------------------------------------------------------------------------ No results for input(s): TSH, T4TOTAL, T3FREE, THYROIDAB in the last 72 hours.  Invalid input(s): FREET3 ------------------------------------------------------------------------------------------------------------------ No results for input(s): VITAMINB12, FOLATE, FERRITIN, TIBC, IRON, RETICCTPCT in the last 72 hours.  Coagulation profile No results for input(s): INR, PROTIME in the last 168 hours.  No results for input(s): DDIMER in the last 72 hours.  Cardiac Enzymes No results for input(s): CKMB, TROPONINI, MYOGLOBIN in  the last 168 hours.  Invalid input(s): CK ------------------------------------------------------------------------------------------------------------------ Invalid input(s): [place]  is a 51 y.o. female with a known history of Diabetes, diabetic neuropathy, chronic left foot ulcer presents to the emergency room with 2 days of worsening redness, swelling and pain in the left foot. Patient goes to the local wound care clinic.  *Sepsis with hypotension blood pressure now improved  Due to low blood pressure after procedure had to be started on midrodrine  *Left foot diabetic ulcer with cellulitis. Possible abscess. Sepsis present on  admission. Status post incision and drainage  And few times Antibiotics as per ID will need prolonged antibiotics Vanc, po cipro and flagyl Status post surgery with antibiotic beads placement by dr cline  WBCs started coming down now, monitor for 1-2 days and then d/c as per podiatry. PT eval noted recommends rehab  * Insulin dependent diabetes mellitus.  Continue insulin therapy Increased levemir for better control.  *Hypotension due to sepsis currently stable  * Acute kidney injury due to sepsis. Resolved with IV fluid   Monitor with vanc.  *Allergic reaction to antibiotics was on prednisone and Benadryl , Atarax as needed   stop now.  * DVT prophylaxis. Start patient on Lovenox for DVT prophylaxis  CSW for d/c planning in 1-2 days     Code Status Orders        Start     Ordered   12/31/16 1131  Full code  Continuous     12/31/16 1132    Code Status History    Date Active Date Inactive Code Status Order ID Comments User Context   This patient has a current code status but no historical code status.     Consults podiatry  DVT Prophylaxis  Lovenox   Lab Results  Component Value Date   PLT 309 01/12/2017     Time Spent in minutes   35 min  Greater than 50% of time spent in care coordination and counseling patient regarding the condition and plan of care.  Josh Nicolosi M.D on 01/12/2017 at 4:49 PM  Between 7am to 6pm - Pager - [phone]  After 6pm go to [email or web address] - password [email or web address] [facility]   Office  [phone]

## 2017-01-12 NOTE — Progress Notes (Signed)
Inpatient Diabetes Program Recommendations  AACE/ADA: New Consensus Statement on Inpatient Glycemic Control (2015)  Target Ranges:  Prepandial:   less than 140 mg/dL      Peak postprandial:   less than 180 mg/dL (1-2 hours)      Critically ill patients:  140 - 180 mg/dL   Results for Donna Fisher, Donna Fisher (MRN [id number]) as of 01/12/2017 11:38  Ref. Range 01/11/2017 07:38 01/11/2017 11:52 01/11/2017 16:31 01/11/2017 21:05 01/12/2017 07:40  Glucose-Capillary Latest Ref Range: 65 - 99 mg/dL 224 (H) 189 (H) 169 (H) 255 (H) 126 (H)   Review of Glycemic Control Diabetes history: DM2 Outpatient Diabetes medications: None listed; pt was taking Lantus in the past but not able to afford Current orders for Inpatient glycemic control: Levemir 40 units QHS, Novolog 12 units TID with meals, Novolog 0-15 units TID with meals, Novolog 0-5 units QHS  Inpatient Diabetes Program Recommendations:   Discharge plan for DM control: CBGs improved with recent insulin changes. Note patient has no insurance and will need medication assistance at time of discharge(CM aware and following). Due to cost of Levemir and Novolog, MD may want to consider discharging on NOVOLIN 70/30. Based on current insulin orders would recommend NOVOLIN 70/30 28 units BID with breakfast and supper (which will provide a total of 40 units for basal and 16 units for meal coverage per day). Also may want to prescribe NOVOLIN Regular per correction scale if needed.  Thanks, Barnie Alderman, RN, MSN, CDE Diabetes Coordinator Inpatient Diabetes Program [phone] (Team Pager from 8am to 5pm)

## 2017-01-12 NOTE — Progress Notes (Signed)
3 Days Post-Op   Subjective/Chief Complaint: Patient seen. Foot is feeling okay but she does complain of some swelling. Arms and legs.   Objective: Vital signs in last 24 hours: Temp:  [98.6 F (37 C)-99.1 F (37.3 C)] 99.1 F (37.3 C) (06/27 0751) Pulse Rate:  [92-95] 95 (06/27 0751) Resp:  [18] 18 (06/27 0751) BP: (90-117)/(54-59) 90/58 (06/27 0751) SpO2:  [96 %-98 %] 98 % (06/27 0751) Last BM Date: 01/11/17  Intake/Output from previous day: 06/26 0701 - 06/27 0700 In: 460 [P.O.:460] Out: -  Intake/Output this shift: No intake/output data recorded.  Bandage on the left foot is dry and intact. Wound not assessed today. White count continues to trend down.  Lab Results:   Recent Labs  01/11/17 0457 01/12/17 0442  WBC 19.6* 12.9*  HGB 10.7* 10.2*  HCT 32.0* 30.2*  PLT 337 309   BMET  Recent Labs  01/11/17 0457  NA 137  K 4.6  CL 95*  CO2 36*  GLUCOSE 232*  BUN 20  CREATININE 0.92  CALCIUM 8.1*   PT/INR No results for input(s): LABPROT, INR in the last 72 hours. ABG No results for input(s): PHART, HCO3 in the last 72 hours.  Invalid input(s): PCO2, PO2  Studies/Results: No results found.  Anti-infectives: Anti-infectives    Start     Dose/Rate Route Frequency Ordered Stop   01/08/17 1600  ciprofloxacin (CIPRO) tablet 500 mg     500 mg Oral 2 times daily 01/08/17 1450     01/08/17 1000  vancomycin (VANCOCIN) IVPB 1000 mg/200 mL premix     1,000 mg 200 mL/hr over 60 Minutes Intravenous Every 12 hours 01/08/17 0844     01/06/17 2200  vancomycin (VANCOCIN) IVPB 1000 mg/200 mL premix  Status:  Discontinued     1,000 mg 200 mL/hr over 60 Minutes Intravenous Every 12 hours 01/06/17 1500 01/08/17 0844   01/06/17 1530  vancomycin (VANCOCIN) IVPB 1000 mg/200 mL premix     1,000 mg 200 mL/hr over 60 Minutes Intravenous  Once 01/06/17 1442 01/06/17 1739   01/03/17 1500  cefTRIAXone (ROCEPHIN) 2 g in dextrose 5 % 50 mL IVPB  Status:  Discontinued     2  g 100 mL/hr over 30 Minutes Intravenous Every 24 hours 01/03/17 1414 01/06/17 1432   01/03/17 1415  metroNIDAZOLE (FLAGYL) tablet 500 mg     500 mg Oral Every 8 hours 01/03/17 1414     01/03/17 1400  meropenem (MERREM) 1 g in sodium chloride 0.9 % 100 mL IVPB  Status:  Discontinued     1 g 200 mL/hr over 30 Minutes Intravenous Every 8 hours 01/03/17 1030 01/03/17 1414   12/31/16 1800  meropenem (MERREM) 1 g in sodium chloride 0.9 % 100 mL IVPB  Status:  Discontinued     1 g 200 mL/hr over 30 Minutes Intravenous Every 12 hours 12/31/16 1204 01/03/17 1030   12/31/16 1630  vancomycin (VANCOCIN) 1,250 mg in sodium chloride 0.9 % 250 mL IVPB  Status:  Discontinued     1,250 mg 166.7 mL/hr over 90 Minutes Intravenous Every 24 hours 12/31/16 1213 01/03/17 1414   12/31/16 1600  vancomycin (VANCOCIN) 1,250 mg in sodium chloride 0.9 % 250 mL IVPB  Status:  Discontinued     1,250 mg 166.7 mL/hr over 90 Minutes Intravenous Every 24 hours 12/31/16 1204 12/31/16 1213   12/31/16 1045  aztreonam (AZACTAM) 2 g in dextrose 5 % 50 mL IVPB     2 g  100 mL/hr over 30 Minutes Intravenous  Once 12/31/16 1022 12/31/16 1113   12/31/16 1030  metroNIDAZOLE (FLAGYL) IVPB 500 mg     500 mg 100 mL/hr over 60 Minutes Intravenous  Once 12/31/16 1022 12/31/16 1200   12/31/16 1030  vancomycin (VANCOCIN) IVPB 1000 mg/200 mL premix     1,000 mg 200 mL/hr over 60 Minutes Intravenous  Once 12/31/16 1022 12/31/16 1217      Assessment/Plan: s/p Procedure(s): IRRIGATION AND DEBRIDEMENT EXTREMITY (Left) Assessment: Stable status post multiple I&D's left foot   Plan: Dressing left intact today. We will reassess the wound tomorrow. If white count remains stable patient should be able to be discharged by the weekend. We will begin physical therapy for assisted nonweightbearing on the left lower extremity  LOS: 12 days    [name] 01/12/2017

## 2017-01-13 LAB — GLUCOSE, CAPILLARY
GLUCOSE-CAPILLARY: 106 mg/dL — AB (ref 65–99)
Glucose-Capillary: 142 mg/dL — ABNORMAL HIGH (ref 65–99)
Glucose-Capillary: 136 mg/dL — ABNORMAL HIGH (ref 65–99)

## 2017-01-13 LAB — VANCOMYCIN, TROUGH: Vancomycin Tr: 21 ug/mL (ref 15–20)

## 2017-01-13 MED ORDER — VANCOMYCIN HCL IN DEXTROSE 750-5 MG/150ML-% IV SOLN
750.0000 mg | Freq: Two times a day (BID) | INTRAVENOUS | Status: DC
  Administered 2017-01-13 (×2): 750 mg via INTRAVENOUS
  Filled 2017-01-13 (×3): qty 150

## 2017-01-13 MED ORDER — CIPROFLOXACIN HCL 500 MG PO TABS: 500.0000 mg | ORAL_TABLET | Freq: Two times a day (BID) | ORAL | 0 refills | Status: AC

## 2017-01-13 MED ORDER — VANCOMYCIN HCL IN DEXTROSE 750-5 MG/150ML-% IV SOLN: 750.0000 mg | Freq: Two times a day (BID) | INTRAVENOUS | 0 refills | Status: AC

## 2017-01-13 MED ORDER — INSULIN ASPART PROT & ASPART (70-30 MIX) 100 UNIT/ML ~~LOC~~ SUSP: 28.0000 [IU] | Freq: Two times a day (BID) | SUBCUTANEOUS | 11 refills | Status: DC

## 2017-01-13 MED ORDER — VANCOMYCIN HCL IN DEXTROSE 1-5 GM/200ML-% IV SOLN: 1000.0000 mg | Freq: Two times a day (BID) | INTRAVENOUS | 0 refills | Status: DC

## 2017-01-13 MED ORDER — MIDODRINE HCL 5 MG PO TABS: 5.0000 mg | ORAL_TABLET | Freq: Three times a day (TID) | ORAL | 0 refills | Status: DC

## 2017-01-13 MED ORDER — METRONIDAZOLE 500 MG PO TABS: 500.0000 mg | ORAL_TABLET | Freq: Three times a day (TID) | ORAL | 0 refills | Status: AC

## 2017-01-13 MED ORDER — INSULIN ASPART PROT & ASPART (70-30 MIX) 100 UNIT/ML ~~LOC~~ SUSP: 28.0000 [IU] | Freq: Two times a day (BID) | SUBCUTANEOUS | Status: DC

## 2017-01-13 MED ORDER — INSULIN DETEMIR 100 UNIT/ML ~~LOC~~ SOLN: 40.0000 [IU] | Freq: Every day | SUBCUTANEOUS | 11 refills | Status: DC

## 2017-01-13 MED ORDER — ADULT MULTIVITAMIN W/MINERALS CH: 1.0000 | ORAL_TABLET | Freq: Every day | ORAL | 0 refills | Status: DC

## 2017-01-13 MED ORDER — INSULIN ASPART 100 UNIT/ML ~~LOC~~ SOLN: 12.0000 [IU] | Freq: Three times a day (TID) | SUBCUTANEOUS | 11 refills | Status: DC

## 2017-01-13 MED ORDER — OXYCODONE HCL 15 MG PO TABS: 15.0000 mg | ORAL_TABLET | Freq: Three times a day (TID) | ORAL | 0 refills | Status: DC | PRN

## 2017-01-13 NOTE — Progress Notes (Addendum)
Pt due to have 70/30 and SSI and meal coverage insulin. Pt received levemir at 2238 on 6/27 and current CBG is 106. I called diabetes coordinator and she suggested to administer pt's meal coverage, hold SSI, and hold 70/30. Per diabetes coordinator, pt should start her 70/30 tomorrow morning and take BID as prescribed. Pt updated and aware.

## 2017-01-13 NOTE — Progress Notes (Signed)
4 Days Post-Op   Subjective/Chief Complaint: Patient seen. States that overall the foot does well and was it is hanging down. She is doing her physical therapy to get around.   Objective: Vital signs in last 24 hours: Temp:  [98.5 F (36.9 C)-99.2 F (37.3 C)] 99.2 F (37.3 C) (06/28 0803) Pulse Rate:  [68-92] 92 (06/28 0803) Resp:  [16-18] 16 (06/28 0803) BP: (108-136)/(42-69) 109/58 (06/28 0803) SpO2:  [94 %-99 %] 94 % (06/28 0803) Weight:  [84.6 kg (186 lb 8 oz)] 84.6 kg (186 lb 8 oz) (06/28 0656) Last BM Date: (P) 01/12/17  Intake/Output from previous day: 06/27 0701 - 06/28 0700 In: 900 [P.O.:900] Out: -  Intake/Output this shift: No intake/output data recorded.  Moderate drainage is noted on the bandaging but less than 2 days ago. Upon removal the incisions are well coapted. Significant reduction in erythema and edema with no expressible pus from the open wounds. Overall looks much better.  Lab Results:   Recent Labs  01/11/17 0457 01/12/17 0442  WBC 19.6* 12.9*  HGB 10.7* 10.2*  HCT 32.0* 30.2*  PLT 337 309   BMET  Recent Labs  01/11/17 0457  NA 137  K 4.6  CL 95*  CO2 36*  GLUCOSE 232*  BUN 20  CREATININE 0.92  CALCIUM 8.1*   PT/INR No results for input(s): LABPROT, INR in the last 72 hours. ABG No results for input(s): PHART, HCO3 in the last 72 hours.  Invalid input(s): PCO2, PO2  Studies/Results: No results found.  Anti-infectives: Anti-infectives    Start     Dose/Rate Route Frequency Ordered Stop   01/08/17 1600  ciprofloxacin (CIPRO) tablet 500 mg     500 mg Oral 2 times daily 01/08/17 1450     01/08/17 1000  vancomycin (VANCOCIN) IVPB 1000 mg/200 mL premix     1,000 mg 200 mL/hr over 60 Minutes Intravenous Every 12 hours 01/08/17 0844     01/06/17 2200  vancomycin (VANCOCIN) IVPB 1000 mg/200 mL premix  Status:  Discontinued     1,000 mg 200 mL/hr over 60 Minutes Intravenous Every 12 hours 01/06/17 1500 01/08/17 0844   01/06/17  1530  vancomycin (VANCOCIN) IVPB 1000 mg/200 mL premix     1,000 mg 200 mL/hr over 60 Minutes Intravenous  Once 01/06/17 1442 01/06/17 1739   01/03/17 1500  cefTRIAXone (ROCEPHIN) 2 g in dextrose 5 % 50 mL IVPB  Status:  Discontinued     2 g 100 mL/hr over 30 Minutes Intravenous Every 24 hours 01/03/17 1414 01/06/17 1432   01/03/17 1415  metroNIDAZOLE (FLAGYL) tablet 500 mg     500 mg Oral Every 8 hours 01/03/17 1414     01/03/17 1400  meropenem (MERREM) 1 g in sodium chloride 0.9 % 100 mL IVPB  Status:  Discontinued     1 g 200 mL/hr over 30 Minutes Intravenous Every 8 hours 01/03/17 1030 01/03/17 1414   12/31/16 1800  meropenem (MERREM) 1 g in sodium chloride 0.9 % 100 mL IVPB  Status:  Discontinued     1 g 200 mL/hr over 30 Minutes Intravenous Every 12 hours 12/31/16 1204 01/03/17 1030   12/31/16 1630  vancomycin (VANCOCIN) 1,250 mg in sodium chloride 0.9 % 250 mL IVPB  Status:  Discontinued     1,250 mg 166.7 mL/hr over 90 Minutes Intravenous Every 24 hours 12/31/16 1213 01/03/17 1414   12/31/16 1600  vancomycin (VANCOCIN) 1,250 mg in sodium chloride 0.9 % 250 mL IVPB  Status:  Discontinued     1,250 mg 166.7 mL/hr over 90 Minutes Intravenous Every 24 hours 12/31/16 1204 12/31/16 1213   12/31/16 1045  aztreonam (AZACTAM) 2 g in dextrose 5 % 50 mL IVPB     2 g 100 mL/hr over 30 Minutes Intravenous  Once 12/31/16 1022 12/31/16 1113   12/31/16 1030  metroNIDAZOLE (FLAGYL) IVPB 500 mg     500 mg 100 mL/hr over 60 Minutes Intravenous  Once 12/31/16 1022 12/31/16 1200   12/31/16 1030  vancomycin (VANCOCIN) IVPB 1000 mg/200 mL premix     1,000 mg 200 mL/hr over 60 Minutes Intravenous  Once 12/31/16 1022 12/31/16 1217      Assessment/Plan: s/p Procedure(s): IRRIGATION AND DEBRIDEMENT EXTREMITY (Left) Assessment: Abscess status post multiple I&D, improving.   Plan: Sterile saline wet-to-dry gauze packed into the dorsal and plantar wounds. Dry bulky bandage. Patient should be stable  for discharge today with either home health care or skilled nursing 4 dry dressing changes 3 times a week. Follow-up in 2 weeks for reevaluation outpatient at [facility]  LOS: 13 days    [name] 01/13/2017

## 2017-01-13 NOTE — Progress Notes (Signed)
Scale on bed is inaccurate, therefore not able to weigh patient.

## 2017-01-13 NOTE — Care Management (Addendum)
Vancomycin schedule is 10 and 10 pm. Per Dr. Posey Pronto, patient can get 10 pm dose at 4 pm today at home.     Per [facility] they will start vancomycin tomorrow morning at home. They are coming to patients room to update patient on Vancomycin POC.

## 2017-01-13 NOTE — Progress Notes (Addendum)
Patient is to be discharged home today with home health. Patient is in no acute distress at this time, and assessment is unchanged from this morning. Insulin education given to pt regarding 70/30 and the need to roll insulin in order to mix it properly prior to administration. HHRN should continue this education tomorrow. Discharge paperwork has been discussed with patient/family and there are no questions or concerns at this time. Patient will be accompanied downstairs by staff and family via wheelchair.

## 2017-01-13 NOTE — Progress Notes (Signed)
Pt was able to get all her medications from medication management except for midodrine and her narcotic. As she does not have insurance, she asked me to call her pharmacy to ask about cost of medication to make sure she can afford it upon discharge. Per Gerald Stabs at [facility], 90 5mg  tabs of midodrine would cost $133.85. Per Ms. Tomkinson that would be unaffordable. Gerald Stabs then told me that 45 10mg  tabs of midodrine would cost $48.80, and Ms. Madl states that she would be able to afford that. Dr. Posey Pronto notified, she is going to call in 45 10mg  tabs of midodrine and pt is to take 1/2 tab TID with meals. Ms. Brunelle states that her daughter will be able to pick up the prescription as well as her pain medications in the morning, and Gerald Stabs at the pharmacy stated that the medication is in stock so they will have it ready for Ms. Freestone first thing in the morning.

## 2017-01-13 NOTE — Progress Notes (Signed)
Pharmacy Antibiotic Note  Donna Fisher is a 51 y.o. female admitted on 12/31/2016 with wound infection.  Pharmacy has been consulted for vancomycin dosing.  Plan: 6/28 Vanc trough =21 Renal function stable. Trough has been trending up 16, 17, 21 Will decrease dose to 750mg  q 12 hrs. Patient is to d/c today with home health. HH will need to draw trough at new steady state  Height: 5\' 5"  (165.1 cm) Weight: 186 lb 8 oz (84.6 kg) IBW/kg (Calculated) : 57  Temp (24hrs), Avg:98.8 F (37.1 C), Min:98.5 F (36.9 C), Max:99.2 F (37.3 C)   Recent Labs Lab 01/07/17 0500 01/08/17 0520  01/09/17 0458 01/09/17 0943 01/10/17 1205 01/11/17 0457 01/12/17 0442 01/13/17 0948  WBC 18.4* 20.9*  --  22.1*  --  27.2* 19.6* 12.9*  --   CREATININE 0.78 0.92  --   --   --   --  0.92  --   --   VANCOTROUGH  --   --   < >  --  17  --   --   --  21*  < > = values in this interval not displayed.  Estimated Creatinine Clearance: 78.5 mL/min (by C-G formula based on SCr of 0.92 mg/dL).    Allergies  Allergen Reactions  . Penicillins Swelling    .Has patient had a PCN reaction causing immediate rash, facial/tongue/throat swelling, SOB or lightheadedness with hypotension: No Has patient had a PCN reaction causing severe rash involving mucus membranes or skin necrosis: No Has patient had a PCN reaction that required hospitalization: No Has patient had a PCN reaction occurring within the last 10 years: No If all of the above answers are "NO", then may proceed with Cephalosporin use.   . Codeine Itching  . Ceftriaxone Rash    Aztreonam/Metronidazole 6/15 x 1.  Meropenem 6/15 >> 6/18 Vancomycin 6/15 >> 6/18, 6/21 >> CTX 6/18 >> 6/21 Metronidazole 6/18 >> Cipro 6/23 >>   Results for orders placed or performed during the hospital encounter of 12/31/16  Blood culture (routine x 2)     Status: None   Collection Time: 12/31/16 10:20 AM  Result Value Ref Range Status   Specimen Description BLOOD  LEFT ANTECUBITAL  Final   Special Requests   Final    BOTTLES DRAWN AEROBIC AND ANAEROBIC Blood Culture adequate volume   Culture NO GROWTH 5 DAYS  Final   Report Status 01/05/2017 FINAL  Final  Urine culture     Status: Abnormal   Collection Time: 12/31/16 10:22 AM  Result Value Ref Range Status   Specimen Description URINE, CLEAN CATCH  Final   Special Requests NONE  Final   Culture (A)  Final    <10,000 COLONIES/mL INSIGNIFICANT GROWTH Performed at [facility], [place]. [address], [place], [place] [address]    Report Status 01/01/2017 FINAL  Final  Blood culture (routine x 2)     Status: None   Collection Time: 12/31/16 10:23 AM  Result Value Ref Range Status   Specimen Description BLOOD LEFT FEMORAL ARTERY  Final   Special Requests   Final    BOTTLES DRAWN AEROBIC AND ANAEROBIC Blood Culture adequate volume   Culture NO GROWTH 5 DAYS  Final   Report Status 01/05/2017 FINAL  Final  Surgical pcr screen     Status: None   Collection Time: 12/31/16  2:40 PM  Result Value Ref Range Status   MRSA, PCR NEGATIVE NEGATIVE Final   Staphylococcus aureus NEGATIVE NEGATIVE  Final    Comment:        The Xpert SA Assay (FDA approved for NASAL specimens in patients over 31 years of age), is one component of a comprehensive surveillance program.  Test performance has been validated by [facility] for patients greater than or equal to 41 year old. It is not intended to diagnose infection nor to guide or monitor treatment.   Aerobic/Anaerobic Culture (surgical/deep wound)     Status: None   Collection Time: 12/31/16  4:30 PM  Result Value Ref Range Status   Specimen Description WOUND  Final   Special Requests NONE  Final   Gram Stain   Final    ABUNDANT WBC PRESENT, PREDOMINANTLY PMN RARE SQUAMOUS EPITHELIAL CELLS PRESENT ABUNDANT GRAM POSITIVE COCCI IN PAIRS MODERATE GRAM NEGATIVE RODS FEW GRAM POSITIVE RODS    Culture   Final    MODERATE STREPTOCOCCUS GROUP G NO ANAEROBES  ISOLATED Performed at [facility], [place] [address], [place], [place] [address]    Report Status 01/06/2017 FINAL  Final  CULTURE, BLOOD (ROUTINE X 2) w Reflex to ID Panel     Status: None   Collection Time: 01/02/17  1:15 PM  Result Value Ref Range Status   Specimen Description BLOOD RIGHT ANTECUBITAL  Final   Special Requests   Final    BOTTLES DRAWN AEROBIC AND ANAEROBIC Blood Culture adequate volume   Culture NO GROWTH 5 DAYS  Final   Report Status 01/07/2017 FINAL  Final  CULTURE, BLOOD (ROUTINE X 2) w Reflex to ID Panel     Status: None   Collection Time: 01/02/17  1:15 PM  Result Value Ref Range Status   Specimen Description BLOOD BLOOD RIGHT FOREARM  Final   Special Requests   Final    BOTTLES DRAWN AEROBIC AND ANAEROBIC Blood Culture adequate volume   Culture NO GROWTH 5 DAYS  Final   Report Status 01/07/2017 FINAL  Final  Aerobic/Anaerobic Culture (surgical/deep wound)     Status: Abnormal   Collection Time: 01/06/17 11:06 AM  Result Value Ref Range Status   Specimen Description   Final    ABSCESS DEEP ABSCESS FROM 1ST AND 2ND METATARSAL REGION   Special Requests NONE  Final   Gram Stain   Final    ABUNDANT WBC PRESENT, PREDOMINANTLY PMN FEW GRAM POSITIVE COCCI IN PAIRS FEW GRAM POSITIVE RODS RARE GRAM NEGATIVE RODS    Culture (A)  Final    MULTIPLE ORGANISMS PRESENT, NONE PREDOMINANT FEW BACTEROIDES THETAIOTAOMICRON BETA LACTAMASE POSITIVE Performed at [facility], [place] [address] [place], [place] [address]    Report Status 01/12/2017 FINAL  Final     Thank you for allowing pharmacy to be a part of this patient's care.  Ramond Dial, Pharm.D, BCPS Clinical Pharmacist  01/13/2017 11:12 AM

## 2017-01-13 NOTE — Progress Notes (Signed)
Physical Therapy Treatment Patient Details Name: Donna Fisher MRN: [id number] DOB: August 04, 1965 Today's Date: 01/13/2017    History of Present Illness Pt is a 51 y.o. female admitted to hospital 12/31/16 with L foot diabetic ulcer with cellulitis; sepsis with hypotension.  Pt s/p I&D L foot 12/31/16, 01/06/17, and 01/09/17.  Pt [place] L LE.  PMHx:  DM, neuropathy, heel and achilles spur, gas in tissues of L foot at toes.    PT Comments    Pt able to progress to ambulating 40 feet with RW but distance limited d/t pt fatigue (decreased R foot step length and foot clearance noted with distance/fatigue; L LE NWB'ing status maintained).  Pt requiring 2 assist for stairs (see below for details).  Continue to recommend STR (d/t limited mobility, impaired activity tolerance, and assist levels with stairs) but pt reports discharging home today.  Pt educated on alternative of "bumping" up/down stairs sitting on her bottom (pt reports she will think about the options but reports she has 2 strong female family members that can assist her on stairs).  Will continue to work with pt during hospitalization on improving activity tolerance, strength, increasing ambulation distance, and decreasing assist levels with stairs.   Follow Up Recommendations  SNF     Equipment Recommendations  Rolling walker with 5" wheels    Recommendations for Other Services       Precautions / Restrictions Precautions Precautions: Fall Precaution Comments: Elevate operative extremity on 2 pillows at all times. Restrictions Weight Bearing Restrictions: Yes LLE Weight Bearing: Non weight bearing    Mobility  Bed Mobility               General bed mobility comments: Deferred d/t pt sitting up in chair upon PT arrival.  Transfers Overall transfer level: Needs assistance Equipment used: Rolling walker (2 wheeled) Transfers: Sit to/from Stand Sit to Stand: Supervision Stand pivot transfers: Supervision        General transfer comment: SBA sit to/from stand from recliner x2 trials; stand pivot to recliner SBA  Ambulation/Gait Ambulation/Gait assistance: Supervision Ambulation Distance (Feet): 40 Feet Assistive device: Rolling walker (2 wheeled)   Gait velocity: decreased   General Gait Details: NWB L LE with good compliance; decreased R LE step length and foot clearance (shoe on R foot); limited d/t fatigue   Stairs Stairs: Yes   Stair Management: Two rails;Forwards Number of Stairs: 2 General stair comments: pt min assist x2 to ascend 1st step and mod assist x2 for 2nd step; CGA x2 to descend 2 steps; initial visual demo and vc's for technique required; [place] L LE maintained  Wheelchair Mobility    Modified Rankin (Stroke Patients Only)       Balance Overall balance assessment: Needs assistance Sitting-balance support: No upper extremity supported;Feet unsupported Sitting balance-Leahy Scale: Normal Sitting balance - Comments: sitting reaching outside BOS   Standing balance support: Bilateral upper extremity supported (on RW) Standing balance-Leahy Scale: Good Standing balance comment: with ambulation                            Cognition Arousal/Alertness: Awake/alert Behavior During Therapy: WFL for tasks assessed/performed Overall Cognitive Status: Within Functional Limits for tasks assessed                                        Exercises  General Comments General comments (skin integrity, edema, etc.): Pt sitting up in chair upon PT arrival.  Nursing cleared pt for participation in physical therapy.  Pt agreeable to PT session.      Pertinent Vitals/Pain Pain Assessment: 0-10 Pain Score: 4  Pain Location: L foot Pain Descriptors / Indicators: Aching;Sore Pain Intervention(s): Limited activity within patient's tolerance;Monitored during session;Premedicated before session;Repositioned (L LE elevated via 2 pillows)  Vitals (HR  and O2 on room air) stable and WFL throughout treatment session.    Home Living                      Prior Function            PT Goals (current goals can now be found in the care plan section) Acute Rehab PT Goals Patient Stated Goal: to walk and get home PT Goal Formulation: With patient Time For Goal Achievement: 01/26/17 Potential to Achieve Goals: Fair Progress towards PT goals: Progressing toward goals    Frequency    7X/week      PT Plan Current plan remains appropriate    Co-evaluation              AM-PAC PT "6 Clicks" Daily Activity  Outcome Measure  Difficulty turning over in bed (including adjusting bedclothes, sheets and blankets)?: None Difficulty moving from lying on back to sitting on the side of the bed? : None Difficulty sitting down on and standing up from a chair with arms (e.g., wheelchair, bedside commode, etc,.)?: A Little Help needed moving to and from a bed to chair (including a wheelchair)?: A Little Help needed walking in hospital room?: A Little Help needed climbing 3-5 steps with a railing? : Total 6 Click Score: 18    End of Session Equipment Utilized During Treatment: Gait belt Activity Tolerance: Patient limited by fatigue Patient left: in chair;with call bell/phone within reach;with chair alarm set (L LE elevated via pillows) Nurse Communication: Mobility status;Precautions;Weight bearing status PT Visit Diagnosis: Muscle weakness (generalized) (M62.81);Other abnormalities of gait and mobility (R26.89);Difficulty in walking, not elsewhere classified (R26.2);Pain Pain - Right/Left: Left Pain - part of body: Ankle and joints of foot     Time: 1106-1130 PT Time Calculation (min) (ACUTE ONLY): 24 min  Charges:  $Gait Training: 23-37 mins                    G CodesLeitha Bleak, PT 01/13/17, 12:37 PM [phone]

## 2017-01-13 NOTE — Progress Notes (Signed)
Pt is to be discharged home today, but needs to receive her evening dose of vanc prior to discharge. Pharmacy suggests that she receive her evening dose no earlier than 2000. I spoke to pt and she is agreeable to staying until her 2000 dose of vanc is complete. The problem is that she needs to go to medical management to have her prescriptions filled at a discounted price, and is concerned that she will not be able to get her medications if she is discharged late this evening. I brought up these concerns with the case manager and she stated she would get the pt her prescription medications if I could fax hard copies of her scripts to medical management. Additionally, the diabetes coordinator called and stated that the insulin the pt will need to be discharged on would be 70/30 novolin and would need to take 28 units BID due to financial reasons. Dr. Posey Pronto notified.   Update 1230: Dr. Posey Pronto stated she will changed the insulin to 70/30 novolin and will tube down paper scripts for the medication to be faxed to medication management. The case manager is also aware.

## 2017-01-13 NOTE — Care Management (Signed)
Primary nurse faxed prescriptions to Medication management. RNCM picked up all prescriptions excepts pain medications and midodrine. They do not provide these drugs. Patient appreciative.

## 2017-01-13 NOTE — Discharge Summary (Addendum)
[facility] NAME: Donna Fisher    MR#:  [id number]  DATE OF BIRTH:  1965/12/16  DATE OF ADMISSION:  12/31/2016 ADMITTING PHYSICIAN: Hillary Bow, MD  DATE OF DISCHARGE: 01/13/2017  PRIMARY CARE PHYSICIAN: [facility] [place]    ADMISSION DIAGNOSIS:  Cellulitis of left lower extremity [L03.116] Sepsis, due to unspecified organism ([place]) [A41.9]  DISCHARGE DIAGNOSIS:  Left Foot diabetic foot ulcer s/p recurrent debridement and surgeries Sepsis on admission Dm-2 -uncontrolled  SECONDARY DIAGNOSIS:   Past Medical History:  Diagnosis Date  . Anxiety state, unspecified   . Diabetic neuropathy ([place])   . Genital herpes, unspecified   . Headache(784.0)   . Other and unspecified hyperlipidemia   . Pain in joint, pelvic region and thigh   . Type II or unspecified type diabetes mellitus without mention of complication, uncontrolled   . Urinary tract infection, site not specified   . Vaginitis and vulvovaginitis, unspecified   . Vaginitis and vulvovaginitis, unspecified     HOSPITAL COURSE:   Donna Fisher a 51 y.o.femalewith a known history of Diabetes, diabetic neuropathy, chronic left foot ulcer presents to the emergency room with 2 days of worsening redness, swelling and pain in the left foot. Patient goes to the local wound care clinic.  *Sepsis with hypotension blood pressure now improved  Due to low blood pressure after procedure had to be started on midrodrine  *Left foot diabetic ulcer with cellulitis. Possible abscess. Sepsis present on admission. Status post incision and drainage few times Antibiotics as per ID will need prolonged antibiotics Vanc, po cipro and flagyl til July 13 th Status post surgery with antibiotic beads placement by dr cline  WBCs started coming down now, monitor for 1-2 days and then d/c as per podiatry. PT eval noted recommends rehab---no payor source--pt to go  home with HHPT  * Insulin dependent diabetes mellitus.  Continue insulin therapy novolog 70/30 28 units bid  *Hypotension due to sepsis currently stable  * Acute kidney injury due to sepsis. Resolved with IV fluid   Monitor with vanc.  * DVT prophylaxis.  patient on Lovenox for DVT prophylaxis  D/w today. Ok per Dr cline  CONSULTS OBTAINED:  Treatment Team:  Albertine Patricia, DPM Ebbie Ridge, MD Leonel Ramsay, MD  DRUG ALLERGIES:   Allergies  Allergen Reactions  . Penicillins Swelling    .Has patient had a PCN reaction causing immediate rash, facial/tongue/throat swelling, SOB or lightheadedness with hypotension: No Has patient had a PCN reaction causing severe rash involving mucus membranes or skin necrosis: No Has patient had a PCN reaction that required hospitalization: No Has patient had a PCN reaction occurring within the last 10 years: No If all of the above answers are "NO", then may proceed with Cephalosporin use.   . Codeine Itching  . Ceftriaxone Rash    DISCHARGE MEDICATIONS:   Current Discharge Medication List    START taking these medications   Details  insulin aspart protamine- aspart (NOVOLOG MIX 70/30) (70-30) 100 UNIT/ML injection Inject 0.28 mLs (28 Units total) into the skin 2 (two) times daily with a meal. Qty: 10 mL, Refills: 11    metroNIDAZOLE (FLAGYL) 500 MG tablet Take 1 tablet (500 mg total) by mouth every 8 (eight) hours. Qty: 45 tablet, Refills: 0    midodrine (PROAMATINE) 5 MG tablet Take 1 tablet (5 mg total) by mouth 3 (three) times daily with meals. Qty: 90 tablet, Refills: 0  Multiple Vitamin (MULTIVITAMIN WITH MINERALS) TABS tablet Take 1 tablet by mouth daily. Qty: 30 tablet, Refills: 0    oxyCODONE (ROXICODONE) 15 MG immediate release tablet Take 1 tablet (15 mg total) by mouth every 8 (eight) hours as needed for moderate pain. Qty: 20 tablet, Refills: 0    Vancomycin (VANCOCIN) 750-5 MG/150ML-% SOLN  Inject 150 mLs (750 mg total) into the vein 2 (two) times daily. Qty: 4000 mL, Refills: 0      CONTINUE these medications which have CHANGED   Details  ciprofloxacin (CIPRO) 500 MG tablet Take 1 tablet (500 mg total) by mouth 2 (two) times daily. Qty: 30 tablet, Refills: 0      CONTINUE these medications which have NOT CHANGED   Details  BAYER MICROLET LANCETS lancets 1 each by Other route as needed for other. Use as instructed    Blood Glucose Monitoring Suppl (CONTOUR NEXT EZ MONITOR) W/DEVICE KIT by Does not apply route 3 (three) times daily.    glucose blood test strip 1 each by Other route as needed for other. Use as instructed    amitriptyline (ELAVIL) 25 MG tablet 1/2 pill each bedtime x 1 week, then 1 pill nightly x 1 week, then 1 1/2 pills nightly x 1 week, then 2 pills nightly thereafter. Qty: 60 tablet, Refills: 3   Associated Diagnoses: Morning headache; Migraine without aura; Cephalgia    cyclobenzaprine (FLEXERIL) 10 MG tablet Take 1 tablet (10 mg total) by mouth every 8 (eight) hours as needed for muscle spasms. Qty: 30 tablet, Refills: 1    naproxen (NAPROSYN) 500 MG tablet Take 1 tablet (500 mg total) by mouth 2 (two) times daily with a meal. Qty: 60 tablet, Refills: 0    promethazine (PHENERGAN) 12.5 MG tablet Take 1 tablet (12.5 mg total) by mouth 2 (two) times daily as needed for nausea. Qty: 30 tablet, Refills: 0   Associated Diagnoses: Morning headache; Migraine without aura    sulfacetamide (BLEPH-10) 10 % ophthalmic solution Place 2 drops into both eyes 4 (four) times daily. Qty: 5 mL, Refills: 0      STOP taking these medications     ibuprofen (ADVIL,MOTRIN) 600 MG tablet         If you experience worsening of your admission symptoms, develop shortness of breath, life threatening emergency, suicidal or homicidal thoughts you must seek medical attention immediately by calling 911 or calling your MD immediately  if symptoms less severe.  You Must  read complete instructions/literature along with all the possible adverse reactions/side effects for all the Medicines you take and that have been prescribed to you. Take any new Medicines after you have completely understood and accept all the possible adverse reactions/side effects.   Please note  You were cared for by a hospitalist during your hospital stay. If you have any questions about your discharge medications or the care you received while you were in the hospital after you are discharged, you can call the unit and asked to speak with the hospitalist on call if the hospitalist that took care of you is not available. Once you are discharged, your primary care physician will handle any further medical issues. Please note that NO REFILLS for any discharge medications will be authorized once you are discharged, as it is imperative that you return to your primary care physician (or establish a relationship with a primary care physician if you do not have one) for your aftercare needs so that they can reassess your need for  medications and monitor your lab values. Today   SUBJECTIVE  No new complaints   VITAL SIGNS:  Blood pressure (!) 92/50, pulse 91, temperature 98.6 F (37 C), temperature source Oral, resp. rate 16, height '5\' 5"'  (1.651 m), weight 84.6 kg (186 lb 8 oz), SpO2 96 %.  I/O:    Intake/Output Summary (Last 24 hours) at 01/13/17 1226 Last data filed at 01/13/17 0929  Gross per 24 hour  Intake              900 ml  Output                0 ml  Net              900 ml    PHYSICAL EXAMINATION:  GENERAL:  51 y.o.-year-old patient lying in the bed with no acute distress.  EYES: Pupils equal, round, reactive to light and accommodation. No scleral icterus. Extraocular muscles intact.  HEENT: Head atraumatic, normocephalic. Oropharynx and nasopharynx clear.  NECK:  Supple, no jugular venous distention. No thyroid enlargement, no tenderness.  LUNGS: Normal breath sounds  bilaterally, no wheezing, rales,rhonchi or crepitation. No use of accessory muscles of respiration.  CARDIOVASCULAR: S1, S2 normal. No murmurs, rubs, or gallops.  ABDOMEN: Soft, non-tender, non-distended. Bowel sounds present. No organomegaly or mass.  EXTREMITIES: No pedal edema, cyanosis, or clubbing. Left foot dressing NEUROLOGIC: Cranial nerves II through XII are intact. Muscle strength 5/5 in all extremities. Sensation intact. Gait not checked.  PSYCHIATRIC: The patient is alert and oriented x 3.  SKIN: No obvious rash, lesion, or ulcer.   DATA REVIEW:   CBC   Recent Labs Lab 01/12/17 0442  WBC 12.9*  HGB 10.2*  HCT 30.2*  PLT 309    Chemistries   Recent Labs Lab 01/11/17 0457  NA 137  K 4.6  CL 95*  CO2 36*  GLUCOSE 232*  BUN 20  CREATININE 0.92  CALCIUM 8.1*    Microbiology Results   Recent Results (from the past 240 hour(s))  Aerobic/Anaerobic Culture (surgical/deep wound)     Status: Abnormal   Collection Time: 01/06/17 11:06 AM  Result Value Ref Range Status   Specimen Description   Final    ABSCESS DEEP ABSCESS FROM 1ST AND 2ND METATARSAL REGION   Special Requests NONE  Final   Gram Stain   Final    ABUNDANT WBC PRESENT, PREDOMINANTLY PMN FEW GRAM POSITIVE COCCI IN PAIRS FEW GRAM POSITIVE RODS RARE GRAM NEGATIVE RODS    Culture (A)  Final    MULTIPLE ORGANISMS PRESENT, NONE PREDOMINANT FEW BACTEROIDES THETAIOTAOMICRON BETA LACTAMASE POSITIVE Performed at [facility], [place] [address], [place], [place] [address]    Report Status 01/12/2017 FINAL  Final    RADIOLOGY:  No results found.   Management plans discussed with the patient, family and they are in agreement.  CODE STATUS:     Code Status Orders        Start     Ordered   12/31/16 1131  Full code  Continuous     12/31/16 1132    Code Status History    Date Active Date Inactive Code Status Order ID Comments User Context   This patient has a current code status but no  historical code status.      TOTAL TIME TAKING CARE OF THIS PATIENT: 40 minutes.    Toriann Spadoni M.D on 01/13/2017 at 12:26 PM  Between 7am to 6pm - Pager - [phone] After  6pm go to [email or web address] - password [email or web address] [facility]  Office  [phone]  CC: Primary care physician; [facility] [place]

## 2017-01-13 NOTE — Care Management Note (Addendum)
Case Management Note  Patient Details  Name: Donna Fisher MRN: [id number] Date of Birth: 04/14/1966  Subjective/Objective:   Met with patient at bedside. She is alert, oriented and motivated to go home.  Her daughter and cousin will be coming to stay with her  to assist her with IV and dressing changes. She is married and her husband  is disabled. She will be staying next door to her home for her care. Patient states she has completed paper work for medication management clinic and will be following up with [facility] for medical care.  Address and phone number as noted below verified with patient. She is not interested in SNF. Will need a walker. Ordered from [facility]. [facility] set up for nursing.                  Action/Plan: [address], [place], [place] [address] [phone]  Expected Discharge Date:  01/03/17               Expected Discharge Plan:  [name]  In-House Referral:     Discharge planning Services  CM Consult, [facility], Medication Assistance  Post Acute Care Choice:  Durable Medical Equipment, Home Health Choice offered to:  Patient  DME Arranged:  Walker rolling DME Agency:  [facility]  HH Arranged:  RN [facility] Agency:  [facility]  Status of Service:  In process, will continue to follow  If discussed at Long Length of Stay Meetings, dates discussed:    Additional Comments:  Jolly Mango, RN 01/13/2017, 8:30 AM

## 2017-01-13 NOTE — Progress Notes (Signed)
Clinical Education officer, museum (CSW) received SNF consult. Patient is not eligible for SNF because she has no payer source. RN case manager has arranged home services and verified that patient has support at home. Please reconsult if future social work needs arise. CSW signing off.   [employer], LCSW [phone]

## 2017-02-02 DIAGNOSIS — Z0271 Encounter for disability determination: Secondary | ICD-10-CM

## 2017-02-08 ENCOUNTER — Ambulatory Visit: Payer: Self-pay | Admitting: Pharmacy Technician

## 2017-02-08 DIAGNOSIS — Z79899 Other long term (current) drug therapy: Secondary | ICD-10-CM

## 2017-02-09 NOTE — Progress Notes (Signed)
Met with patient completed financial assistance application for [facility] due to recent hospital visit.  Patient agreed to be responsible for gathering financial information and forwarding to appropriate department in [facility].    Completed [facility] application and contract.  Patient agreed to all terms of the [facility] Management Clinic contract.  Patient to provide SS Disability Statement from spouse.  Provided patient with Civil engineer, contracting based on his particular needs.    Novolin 70/30 Prescription Application completed with patient.  Forwarded to [facility] for signature.  Upon receipt of signed application from provider and proof of income from patient, Novolin 70/30 Prescription Application will be submitted to [employer].  [facility]

## 2017-02-10 ENCOUNTER — Encounter: Payer: Self-pay | Admitting: Pharmacist

## 2017-02-10 ENCOUNTER — Ambulatory Visit: Payer: Self-pay | Admitting: Pharmacist

## 2017-02-10 VITALS — BP 110/60 | Ht 64.0 in | Wt 140.0 lb

## 2017-02-10 DIAGNOSIS — Z79899 Other long term (current) drug therapy: Secondary | ICD-10-CM
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
## 2017-03-17 ENCOUNTER — Telehealth: Payer: Self-pay | Admitting: Pharmacist

## 2017-03-17 NOTE — Telephone Encounter (Signed)
03/17/17 [facility] application for Novolin 70/30 Inject 28 units under the skin every morning & 28 units every evening, # 7 (max daily dose 56 units).Delos Haring

## 2017-05-10 ENCOUNTER — Ambulatory Visit (INDEPENDENT_AMBULATORY_CARE_PROVIDER_SITE_OTHER): Payer: Medicaid Other | Admitting: Certified Nurse Midwife

## 2017-05-10 ENCOUNTER — Telehealth: Payer: Self-pay | Admitting: Certified Nurse Midwife

## 2017-05-10 ENCOUNTER — Encounter: Payer: Self-pay | Admitting: Certified Nurse Midwife

## 2017-05-10 VITALS — BP 96/60 | HR 113 | Ht 64.0 in | Wt 161.7 lb

## 2017-05-10 DIAGNOSIS — N949 Unspecified condition associated with female genital organs and menstrual cycle: Secondary | ICD-10-CM

## 2017-05-10 DIAGNOSIS — T148XXA Other injury of unspecified body region, initial encounter: Secondary | ICD-10-CM

## 2017-05-10 MED ORDER — LIDOCAINE HCL 2 % EX GEL: 1.0000 "application " | CUTANEOUS | 2 refills | Status: DC | PRN

## 2017-05-10 MED ORDER — PREDNISONE 10 MG PO TABS: ORAL_TABLET | ORAL | 0 refills | Status: DC

## 2017-05-10 MED ORDER — VALACYCLOVIR HCL 1 G PO TABS: 1000.0000 mg | ORAL_TABLET | Freq: Two times a day (BID) | ORAL | 3 refills | Status: DC

## 2017-05-10 MED ORDER — SULFAMETHOXAZOLE-TRIMETHOPRIM 800-160 MG PO TABS
1.0000 | ORAL_TABLET | Freq: Two times a day (BID) | ORAL | 1 refills | Status: DC
Start: 2017-05-10 — End: 2018-06-20

## 2017-05-10 MED ORDER — FLUCONAZOLE 100 MG PO TABS: 100.0000 mg | ORAL_TABLET | Freq: Every day | ORAL | 0 refills | Status: AC

## 2017-05-10 NOTE — Telephone Encounter (Signed)
Spoke with pt and informed her that we do not carry samples of this. Pt understood and stated she will be able to pick up rx tomorrow morning. She had no additional questions at this time. Nothing further is needed
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
## 2017-05-10 NOTE — Telephone Encounter (Signed)
Patient called and stated that her medication lidocaine (XYLOCAINE) 2 % jelly, has to be ordered and will not be available for pick up until the morning of 05/11/17. The patient wanted to know if there were any samples she could pick up to hold her until she receives her medication. No other information was disclosed. Please advise.

## 2017-05-11 ENCOUNTER — Encounter: Payer: Self-pay | Admitting: Obstetrics and Gynecology

## 2017-05-11 ENCOUNTER — Ambulatory Visit (INDEPENDENT_AMBULATORY_CARE_PROVIDER_SITE_OTHER): Payer: Medicaid Other | Admitting: Obstetrics and Gynecology

## 2017-05-11 VITALS — BP 84/50 | HR 106 | Ht 64.0 in | Wt 159.1 lb

## 2017-05-11 DIAGNOSIS — N9089 Other specified noninflammatory disorders of vulva and perineum: Secondary | ICD-10-CM | POA: Diagnosis not present

## 2017-05-11 NOTE — Progress Notes (Signed)
Subjective:     Patient ID: Donna Fisher, female   DOB: 02-28-1966, 51 y.o.   MRN: [id number]  HPI Seen yesterday with sudden onset vulvar sores and swelling. Please see visit notes.  States  No real improvement of symptoms until applied lidocaine gel at 12:30 today (pharmacy had to order it). Denies fever and has started all other order medication.  Review of Systems Negative except stated above in HPI    Objective:   Physical Exam A&Ox4 Blood pressure (!) 84/50, 106/58, pulse (!) 106, height 5\' 4"  (1.626 m), weight 159 lb 1.6 oz (72.2 kg). Well appearing female in no acute distress Negative lymphadenopathy in either groin Pelvic exam: VULVA: vulvar tenderness &, vulvar edema &, vulvar excoriation bilaterally and in clitoral hood, also extends to around rectum c/w exam yesterday-no wors, VAGINA: normal appearing vagina with normal color and discharge, no lesions.    Assessment:     HSV lesions with secondary skin infection    Plan:     Will continue with current medications. RTC in 5 days for recheck or as needed.  Raysa Bosak,CNM

## 2017-05-12 LAB — HSV 1 AND 2 IGM ABS, INDIRECT: HSV 1 IgM: <1:10 {titer}

## 2017-05-12 LAB — CBC
HEMATOCRIT: 35.5 % (ref 34.0–46.6)
Hemoglobin: 12.2 g/dL (ref 11.1–15.9)
MCH: 30.3 pg (ref 26.6–33.0)
MCHC: 34.4 g/dL (ref 31.5–35.7)
MCV: 88 fL (ref 79–97)
PLATELETS: 267 10*3/uL (ref 150–379)
RBC: 4.02 x10E6/uL (ref 3.77–5.28)
RDW: 14.9 % (ref 12.3–15.4)
WBC: 11.5 10*3/uL — ABNORMAL HIGH (ref 3.4–10.8)

## 2017-05-12 LAB — HIV ANTIBODY (ROUTINE TESTING W REFLEX): HIV SCREEN 4TH GENERATION: NONREACTIVE

## 2017-05-12 LAB — HSV(HERPES SIMPLEX VRS) I + II AB-IGG
HSV 1 Glycoprotein G Ab, IgG: 33.5 index — ABNORMAL HIGH (ref 0.00–0.90)
HSV 2 IgG, Type Spec: >23.6 index — ABNORMAL HIGH (ref 0.00–0.90)

## 2017-05-12 LAB — HEPATITIS B SURFACE ANTIGEN: Hepatitis B Surface Ag: NEGATIVE

## 2017-05-12 LAB — RPR: RPR Ser Ql: NONREACTIVE

## 2017-05-12 LAB — HEPATITIS C ANTIBODY: Hep C Virus Ab: 0.1 s/co ratio (ref 0.0–0.9)

## 2017-05-14 LAB — GENITAL CULTURE

## 2017-05-14 LAB — HERPES SIMPLEX VIRUS CULTURE

## 2017-05-14 LAB — SPECIMEN STATUS REPORT

## 2017-05-16 ENCOUNTER — Encounter: Payer: Self-pay | Admitting: Certified Nurse Midwife

## 2017-05-16 ENCOUNTER — Ambulatory Visit (INDEPENDENT_AMBULATORY_CARE_PROVIDER_SITE_OTHER): Payer: Medicaid Other | Admitting: Certified Nurse Midwife

## 2017-05-16 VITALS — BP 87/55 | HR 103 | Ht 64.0 in | Wt 159.2 lb

## 2017-05-16 DIAGNOSIS — A6009 Herpesviral infection of other urogenital tract: Secondary | ICD-10-CM | POA: Diagnosis not present

## 2017-05-16 DIAGNOSIS — Z09 Encounter for follow-up examination after completed treatment for conditions other than malignant neoplasm: Secondary | ICD-10-CM

## 2017-05-16 LAB — NUSWAB VAGINITIS PLUS (VG+)
CHLAMYDIA TRACHOMATIS, NAA: NEGATIVE
Candida albicans, NAA: NEGATIVE
Candida glabrata, NAA: POSITIVE — AB
NEISSERIA GONORRHOEAE, NAA: NEGATIVE
TRICH VAG BY NAA: NEGATIVE

## 2017-05-22 ENCOUNTER — Encounter: Payer: Self-pay | Admitting: Certified Nurse Midwife
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
## 2017-06-08 ENCOUNTER — Other Ambulatory Visit: Payer: Self-pay | Admitting: Nurse Practitioner

## 2017-06-08 DIAGNOSIS — Z1231 Encounter for screening mammogram for malignant neoplasm of breast: Secondary | ICD-10-CM

## 2017-07-14 ENCOUNTER — Ambulatory Visit
Admission: RE | Admit: 2017-07-14 | Discharge: 2017-07-14 | Disposition: A | Discharge status: Home / Self Care | Payer: Medicaid Other | Source: Ambulatory Visit | Attending: Nurse Practitioner | Admitting: Nurse Practitioner

## 2017-07-14 DIAGNOSIS — Z1231 Encounter for screening mammogram for malignant neoplasm of breast: Secondary | ICD-10-CM | POA: Diagnosis present

## 2017-08-18 ENCOUNTER — Encounter: Payer: Self-pay | Admitting: Pharmacist
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
## 2018-06-20 ENCOUNTER — Ambulatory Visit (INDEPENDENT_AMBULATORY_CARE_PROVIDER_SITE_OTHER): Payer: Medicaid Other | Admitting: Cardiovascular Disease

## 2018-06-20 ENCOUNTER — Encounter: Payer: Self-pay | Admitting: Cardiovascular Disease

## 2018-06-20 VITALS — BP 115/75 | HR 96 | Ht 63.0 in | Wt 201.8 lb

## 2018-06-20 DIAGNOSIS — F17200 Nicotine dependence, unspecified, uncomplicated: Secondary | ICD-10-CM | POA: Diagnosis not present

## 2018-06-20 DIAGNOSIS — I959 Hypotension, unspecified: Secondary | ICD-10-CM | POA: Diagnosis not present

## 2018-06-20 DIAGNOSIS — E118 Type 2 diabetes mellitus with unspecified complications: Secondary | ICD-10-CM | POA: Insufficient documentation

## 2018-06-20 DIAGNOSIS — E782 Mixed hyperlipidemia: Secondary | ICD-10-CM | POA: Diagnosis not present

## 2018-06-20 DIAGNOSIS — E785 Hyperlipidemia, unspecified: Secondary | ICD-10-CM | POA: Insufficient documentation

## 2018-06-20 MED ORDER — ROSUVASTATIN CALCIUM 5 MG PO TABS: 5.0000 mg | ORAL_TABLET | Freq: Every day | ORAL | 11 refills | Status: DC

## 2018-06-20 MED ORDER — MIDODRINE HCL 10 MG PO TABS: 10.0000 mg | ORAL_TABLET | Freq: Three times a day (TID) | ORAL | 3 refills | Status: DC

## 2018-06-20 NOTE — Patient Instructions (Addendum)
   Medication Instructions:  Please start crestor daily (ok to start every other day for a few weeks)  Increase the midodrine up to 10 mg three times a day  If you need a refill on your cardiac medications before your next appointment, please call your pharmacy.    Lab work: No new labs needed   If you have labs (blood work) drawn today and your tests are completely normal, you will receive your results only by: Marland Kitchen MyChart Message (if you have MyChart) OR . A paper copy in the mail If you have any lab test that is abnormal or we need to change your treatment, we will call you to review the results.   Testing/Procedures: No new testing needed   Follow-Up: At [facility], you and your health needs are our priority.  As part of our continuing mission to provide you with exceptional heart care, we have created designated Provider Care Teams.  These Care Teams include your primary Cardiologist (physician) and Advanced Practice Providers (APPs -  Physician Assistants and Nurse Practitioners) who all work together to provide you with the care you need, when you need it.  . You will need a follow up appointment in 12 months .   Please call our office 2 months in advance to schedule this appointment.    . Providers on your designated Care Team:   . Murray Hodgkins, NP . Christell Faith, PA-C . Marrianne Mood, PA-C  Any Other Special Instructions Will Be Listed Below (If Applicable).  For educational health videos Log in to : [email or web address] Or : [email or web address], password : [email or web address]

## 2018-06-21 ENCOUNTER — Telehealth: Payer: Self-pay | Admitting: Cardiovascular Disease

## 2018-06-21 ENCOUNTER — Other Ambulatory Visit: Payer: Self-pay

## 2018-06-21 MED ORDER — ROSUVASTATIN CALCIUM 5 MG PO TABS: 5.0000 mg | ORAL_TABLET | Freq: Every day | ORAL | 0 refills | Status: DC

## 2018-06-21 MED ORDER — MIDODRINE HCL 10 MG PO TABS: 10.0000 mg | ORAL_TABLET | Freq: Three times a day (TID) | ORAL | 0 refills | Status: DC

## 2018-06-21 NOTE — Telephone Encounter (Deleted)
Requested Prescriptions   Pending Prescriptions Disp Refills  . rosuvastatin (CRESTOR) 5 MG tablet 90 tablet 0    Sig: Take 1 tablet (5 mg total) by mouth daily.  . midodrine (PROAMATINE) 10 MG tablet 270 tablet 0    Sig: Take 1 tablet (10 mg total) by mouth 3 (three) times daily with meals.

## 2018-06-21 NOTE — Telephone Encounter (Signed)
Requested Prescriptions   Signed Prescriptions Disp Refills  . rosuvastatin (CRESTOR) 5 MG tablet 90 tablet 0    Sig: Take 1 tablet (5 mg total) by mouth daily.    Authorizing Provider: Minna Merritts    Ordering User: Janan Ridge midodrine (PROAMATINE) 10 MG tablet 270 tablet 0    Sig: Take 1 tablet (10 mg total) by mouth 3 (three) times daily with meals.    Authorizing Provider: Minna Merritts    Ordering User: Janan Ridge

## 2018-06-21 NOTE — Telephone Encounter (Signed)
°*  STAT* If patient is at the pharmacy, call can be transferred to refill team.   1. Which medications need to be refilled? (please list name of each medication and dose if known)  Rosuvastatin (Crestor)  Midodrine  2. Which pharmacy/location (including street and city if local pharmacy) is medication to be sent to? [place]   3. Do they need a 30 day or 90 day supply?   Patient states that she went to pharmacy after appointment and called yesterday at [place] and pharmacy had not received prescriptions.  Please resend.

## 2018-06-30 ENCOUNTER — Other Ambulatory Visit: Payer: Self-pay | Admitting: Nurse Practitioner

## 2018-06-30 DIAGNOSIS — Z1231 Encounter for screening mammogram for malignant neoplasm of breast: Secondary | ICD-10-CM

## 2018-08-02 ENCOUNTER — Ambulatory Visit
Admission: RE | Admit: 2018-08-02 | Discharge: 2018-08-02 | Disposition: A | Discharge status: Home / Self Care | Payer: Medicaid Other | Source: Ambulatory Visit | Attending: Nurse Practitioner | Admitting: Nurse Practitioner

## 2018-08-02 DIAGNOSIS — Z1231 Encounter for screening mammogram for malignant neoplasm of breast: Secondary | ICD-10-CM | POA: Diagnosis present

## 2018-11-21 ENCOUNTER — Other Ambulatory Visit (INDEPENDENT_AMBULATORY_CARE_PROVIDER_SITE_OTHER): Payer: Self-pay | Admitting: Podiatry

## 2018-11-21 DIAGNOSIS — I739 Peripheral vascular disease, unspecified: Secondary | ICD-10-CM

## 2018-11-22 ENCOUNTER — Ambulatory Visit (INDEPENDENT_AMBULATORY_CARE_PROVIDER_SITE_OTHER): Payer: Medicaid Other | Admitting: Nurse Practitioner

## 2018-11-22 ENCOUNTER — Ambulatory Visit (INDEPENDENT_AMBULATORY_CARE_PROVIDER_SITE_OTHER): Payer: Medicaid Other

## 2018-11-22 ENCOUNTER — Other Ambulatory Visit: Payer: Self-pay

## 2018-11-22 ENCOUNTER — Telehealth (INDEPENDENT_AMBULATORY_CARE_PROVIDER_SITE_OTHER): Payer: Self-pay

## 2018-11-22 ENCOUNTER — Encounter (INDEPENDENT_AMBULATORY_CARE_PROVIDER_SITE_OTHER): Payer: Self-pay

## 2018-11-22 ENCOUNTER — Encounter (INDEPENDENT_AMBULATORY_CARE_PROVIDER_SITE_OTHER): Payer: Self-pay | Admitting: Nurse Practitioner

## 2018-11-22 VITALS — BP 91/58 | HR 114 | Resp 10 | Ht 64.0 in | Wt 194.0 lb

## 2018-11-22 DIAGNOSIS — L98499 Non-pressure chronic ulcer of skin of other sites with unspecified severity: Secondary | ICD-10-CM

## 2018-11-22 DIAGNOSIS — I739 Peripheral vascular disease, unspecified: Secondary | ICD-10-CM

## 2018-11-22 DIAGNOSIS — E118 Type 2 diabetes mellitus with unspecified complications: Secondary | ICD-10-CM

## 2018-11-22 DIAGNOSIS — I70235 Atherosclerosis of native arteries of right leg with ulceration of other part of foot: Secondary | ICD-10-CM

## 2018-11-22 DIAGNOSIS — E782 Mixed hyperlipidemia: Secondary | ICD-10-CM

## 2018-11-22 DIAGNOSIS — F17210 Nicotine dependence, cigarettes, uncomplicated: Secondary | ICD-10-CM

## 2018-11-22 DIAGNOSIS — F17200 Nicotine dependence, unspecified, uncomplicated: Secondary | ICD-10-CM

## 2018-11-22 DIAGNOSIS — I70209 Unspecified atherosclerosis of native arteries of extremities, unspecified extremity: Secondary | ICD-10-CM

## 2018-11-22 DIAGNOSIS — I7090 Unspecified atherosclerosis: Secondary | ICD-10-CM | POA: Insufficient documentation

## 2018-11-22 DIAGNOSIS — I70219 Atherosclerosis of native arteries of extremities with intermittent claudication, unspecified extremity: Secondary | ICD-10-CM | POA: Insufficient documentation

## 2018-11-22 NOTE — Telephone Encounter (Signed)
Patient was seen today and given her pre-procedure instructions for her procedure on Tuesday  11/28/2018 with a 11:00 am arrival time with Dr. Delana Meyer. The zero visitor policy was also discussed.
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
## 2018-11-23 DIAGNOSIS — R194 Change in bowel habit: Secondary | ICD-10-CM | POA: Insufficient documentation

## 2018-11-24 ENCOUNTER — Other Ambulatory Visit (INDEPENDENT_AMBULATORY_CARE_PROVIDER_SITE_OTHER): Payer: Self-pay | Admitting: Nurse Practitioner

## 2018-11-24 ENCOUNTER — Other Ambulatory Visit: Payer: Self-pay | Admitting: Internal Medicine

## 2018-11-24 ENCOUNTER — Other Ambulatory Visit: Payer: Self-pay

## 2018-11-24 ENCOUNTER — Ambulatory Visit
Admission: RE | Admit: 2018-11-24 | Discharge: 2018-11-24 | Disposition: A | Discharge status: Home / Self Care | Payer: Medicaid Other | Source: Ambulatory Visit | Attending: Vascular Surgery | Admitting: Vascular Surgery

## 2018-11-24 DIAGNOSIS — Z1159 Encounter for screening for other viral diseases: Secondary | ICD-10-CM | POA: Insufficient documentation

## 2018-11-24 DIAGNOSIS — E118 Type 2 diabetes mellitus with unspecified complications: Secondary | ICD-10-CM

## 2018-11-25 LAB — NOVEL CORONAVIRUS, NAA (HOSP ORDER, SEND-OUT TO REF LAB; TAT 18-24 HRS): SARS-CoV-2, NAA: NOT DETECTED

## 2018-11-27 MED ORDER — CLINDAMYCIN PHOSPHATE 300 MG/50ML IV SOLN: 300.0000 mg | Freq: Once | INTRAVENOUS | Status: DC

## 2018-11-28 ENCOUNTER — Other Ambulatory Visit: Payer: Self-pay

## 2018-11-28 ENCOUNTER — Ambulatory Visit
Admission: RE | Admit: 2018-11-28 | Discharge: 2018-11-28 | Disposition: A | Discharge status: Home / Self Care | Payer: Medicaid Other | Attending: Vascular Surgery | Admitting: Vascular Surgery

## 2018-11-28 ENCOUNTER — Encounter
Admission: RE | Disposition: A | Discharge status: Home / Self Care | Payer: Self-pay | Source: Home / Self Care | Attending: Vascular Surgery

## 2018-11-28 DIAGNOSIS — Z833 Family history of diabetes mellitus: Secondary | ICD-10-CM | POA: Diagnosis not present

## 2018-11-28 DIAGNOSIS — E1140 Type 2 diabetes mellitus with diabetic neuropathy, unspecified: Secondary | ICD-10-CM | POA: Diagnosis not present

## 2018-11-28 DIAGNOSIS — E785 Hyperlipidemia, unspecified: Secondary | ICD-10-CM | POA: Diagnosis not present

## 2018-11-28 DIAGNOSIS — Z79899 Other long term (current) drug therapy: Secondary | ICD-10-CM | POA: Diagnosis not present

## 2018-11-28 DIAGNOSIS — F17210 Nicotine dependence, cigarettes, uncomplicated: Secondary | ICD-10-CM | POA: Insufficient documentation

## 2018-11-28 DIAGNOSIS — I70235 Atherosclerosis of native arteries of right leg with ulceration of other part of foot: Secondary | ICD-10-CM | POA: Diagnosis not present

## 2018-11-28 DIAGNOSIS — Z90710 Acquired absence of both cervix and uterus: Secondary | ICD-10-CM | POA: Insufficient documentation

## 2018-11-28 DIAGNOSIS — Z880 Allergy status to penicillin: Secondary | ICD-10-CM | POA: Diagnosis not present

## 2018-11-28 DIAGNOSIS — I739 Peripheral vascular disease, unspecified: Secondary | ICD-10-CM

## 2018-11-28 DIAGNOSIS — Z885 Allergy status to narcotic agent status: Secondary | ICD-10-CM | POA: Diagnosis not present

## 2018-11-28 DIAGNOSIS — Z87828 Personal history of other (healed) physical injury and trauma: Secondary | ICD-10-CM | POA: Diagnosis not present

## 2018-11-28 DIAGNOSIS — Z8673 Personal history of transient ischemic attack (TIA), and cerebral infarction without residual deficits: Secondary | ICD-10-CM | POA: Insufficient documentation

## 2018-11-28 DIAGNOSIS — Z8249 Family history of ischemic heart disease and other diseases of the circulatory system: Secondary | ICD-10-CM | POA: Insufficient documentation

## 2018-11-28 DIAGNOSIS — E118 Type 2 diabetes mellitus with unspecified complications: Secondary | ICD-10-CM | POA: Diagnosis not present

## 2018-11-28 DIAGNOSIS — E11621 Type 2 diabetes mellitus with foot ulcer: Secondary | ICD-10-CM | POA: Insufficient documentation

## 2018-11-28 DIAGNOSIS — L97519 Non-pressure chronic ulcer of other part of right foot with unspecified severity: Secondary | ICD-10-CM | POA: Diagnosis not present

## 2018-11-28 DIAGNOSIS — Z794 Long term (current) use of insulin: Secondary | ICD-10-CM | POA: Diagnosis not present

## 2018-11-28 HISTORY — PX: LOWER EXTREMITY ANGIOGRAPHY: CATH118251

## 2018-11-28 LAB — CREATININE, SERUM
Creatinine, Ser: 1.38 mg/dL — ABNORMAL HIGH (ref 0.44–1.00)
GFR calc Af Amer: 51 mL/min — ABNORMAL LOW (ref >60)
GFR calc non Af Amer: 44 mL/min — ABNORMAL LOW (ref >60)

## 2018-11-28 LAB — BUN: BUN: 20 mg/dL (ref 6–20)

## 2018-11-28 LAB — GLUCOSE, CAPILLARY
Glucose-Capillary: 182 mg/dL — ABNORMAL HIGH (ref 70–99)
Glucose-Capillary: 185 mg/dL — ABNORMAL HIGH (ref 70–99)

## 2018-11-28 SURGERY — LOWER EXTREMITY ANGIOGRAPHY
Anesthesia: Moderate Sedation | Laterality: Right

## 2018-11-28 MED ORDER — FAMOTIDINE 20 MG PO TABS: 40.0000 mg | ORAL_TABLET | Freq: Once | ORAL | Status: DC | PRN

## 2018-11-28 MED ORDER — CLINDAMYCIN PHOSPHATE 300 MG/50ML IV SOLN
INTRAVENOUS | Status: AC
  Administered 2018-11-28: 13:00:00
  Filled 2018-11-28: qty 50

## 2018-11-28 MED ORDER — CLOPIDOGREL BISULFATE 75 MG PO TABS: 75.0000 mg | ORAL_TABLET | Freq: Every day | ORAL | 4 refills | Status: DC

## 2018-11-28 MED ORDER — MIDAZOLAM HCL 2 MG/ML PO SYRP: 8.0000 mg | ORAL_SOLUTION | Freq: Once | ORAL | Status: DC | PRN

## 2018-11-28 MED ORDER — MORPHINE SULFATE (PF) 4 MG/ML IV SOLN: 2.0000 mg | INTRAVENOUS | Status: DC | PRN

## 2018-11-28 MED ORDER — FENTANYL CITRATE (PF) 100 MCG/2ML IJ SOLN
INTRAMUSCULAR | Status: AC
  Filled 2018-11-28: qty 2

## 2018-11-28 MED ORDER — FENTANYL CITRATE (PF) 100 MCG/2ML IJ SOLN
INTRAMUSCULAR | Status: DC | PRN
  Administered 2018-11-28: 50 ug via INTRAVENOUS
  Administered 2018-11-28 (×2): 25 ug via INTRAVENOUS

## 2018-11-28 MED ORDER — HYDROMORPHONE HCL 1 MG/ML IJ SOLN: 1.0000 mg | Freq: Once | INTRAMUSCULAR | Status: DC | PRN

## 2018-11-28 MED ORDER — SODIUM CHLORIDE 0.9 % IV SOLN
INTRAVENOUS | Status: DC
  Administered 2018-11-28: 11:00:00 1000 mL via INTRAVENOUS

## 2018-11-28 MED ORDER — CEFAZOLIN SODIUM-DEXTROSE 2-4 GM/100ML-% IV SOLN
INTRAVENOUS | Status: AC
  Filled 2018-11-28: qty 100

## 2018-11-28 MED ORDER — MIDAZOLAM HCL 2 MG/2ML IJ SOLN
INTRAMUSCULAR | Status: DC | PRN
  Administered 2018-11-28 (×2): 0.5 mg via INTRAVENOUS
  Administered 2018-11-28: 1 mg via INTRAVENOUS
  Administered 2018-11-28: 0.5 mg via INTRAVENOUS
  Administered 2018-11-28: 2 mg via INTRAVENOUS

## 2018-11-28 MED ORDER — METHYLPREDNISOLONE SODIUM SUCC 125 MG IJ SOLR: 125.0000 mg | Freq: Once | INTRAMUSCULAR | Status: DC | PRN

## 2018-11-28 MED ORDER — CLOPIDOGREL BISULFATE 300 MG PO TABS: 300.0000 mg | ORAL_TABLET | ORAL | Status: DC

## 2018-11-28 MED ORDER — ONDANSETRON HCL 4 MG/2ML IJ SOLN: 4.0000 mg | Freq: Four times a day (QID) | INTRAMUSCULAR | Status: DC | PRN

## 2018-11-28 MED ORDER — HEPARIN SODIUM (PORCINE) 1000 UNIT/ML IJ SOLN
INTRAMUSCULAR | Status: AC
  Filled 2018-11-28: qty 1

## 2018-11-28 MED ORDER — HEPARIN SODIUM (PORCINE) 1000 UNIT/ML IJ SOLN
INTRAMUSCULAR | Status: DC | PRN
  Administered 2018-11-28: 5000 [IU] via INTRAVENOUS

## 2018-11-28 MED ORDER — ASPIRIN EC 81 MG PO TBEC: 81.0000 mg | DELAYED_RELEASE_TABLET | Freq: Every day | ORAL | 2 refills | Status: DC

## 2018-11-28 MED ORDER — MIDAZOLAM HCL 5 MG/5ML IJ SOLN
INTRAMUSCULAR | Status: AC
  Filled 2018-11-28: qty 5

## 2018-11-28 MED ORDER — DIPHENHYDRAMINE HCL 50 MG/ML IJ SOLN: 50.0000 mg | Freq: Once | INTRAMUSCULAR | Status: DC | PRN

## 2018-11-28 SURGICAL SUPPLY — 19 items
BALLN LUTONIX 018 4X40X130 (BALLOONS) ×3
BALLN ULTRASCORE 014 3X40X150 (BALLOONS) ×3
BALLOON LUTONIX 018 4X40X130 (BALLOONS) ×1 IMPLANT
BALLOON ULTRSCRE 014 3X40X150 (BALLOONS) ×1 IMPLANT
CATH PIG 70CM (CATHETERS) ×3 IMPLANT
CATH VERT 5FR 125CM (CATHETERS) ×3 IMPLANT
DEVICE PRESTO INFLATION (MISCELLANEOUS) ×3 IMPLANT
DEVICE STARCLOSE SE CLOSURE (Vascular Products) ×3 IMPLANT
GLIDEWIRE ADV .035X260CM (WIRE) ×3 IMPLANT
NEEDLE ENTRY 21GA 7CM ECHOTIP (NEEDLE) ×3 IMPLANT
PACK ANGIOGRAPHY (CUSTOM PROCEDURE TRAY) ×3 IMPLANT
SET INTRO CAPELLA COAXIAL (SET/KITS/TRAYS/PACK) ×3 IMPLANT
SHEATH BRITE TIP 5FRX11 (SHEATH) ×3 IMPLANT
SHEATH RAABE 6FR (SHEATH) ×3 IMPLANT
SYR MEDRAD MARK 7 150ML (SYRINGE) ×3 IMPLANT
TUBING CONTRAST HIGH PRESS 72 (TUBING) ×3 IMPLANT
WIRE G V18X300CM (WIRE) ×3 IMPLANT
WIRE J 3MM .035X145CM (WIRE) ×3 IMPLANT
WIRE SPARTACORE .014X300CM (WIRE) ×3 IMPLANT

## 2018-11-28 NOTE — H&P (Signed)
[facility] History & Physical Update  The patient was interviewed and re-examined.  The patient's previous History and Physical has been reviewed and is unchanged.  There is no change in the plan of care. We plan to proceed with the scheduled procedure.  Hortencia Pilar, MD  11/28/2018, 12:55 PM

## 2018-11-28 NOTE — Op Note (Signed)
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 Introduction catheter into right lower extremity 3rd order catheter placement  2. Contrast injection right lower extremity for distal runoff with additional 3rd order  3. Percutaneous transluminal angioplasty right tibioperoneal trunk to 4 mm with Lutonix drug-eluting balloon              4. Star close closure left common femoral arteriotomy  Anesthesia: Conscious sedation was administered under my direct supervision by the interventional radiology RN. IV Versed plus fentanyl were utilized. Continuous ECG, pulse oximetry and blood pressure was monitored throughout the entire procedure.  Conscious sedation was for a total of 1 hour 17 minutes.  Sheath: 6 [place] Raby left common femoral retrograde  Contrast: 110 cc  Fluoroscopy Time: 7.9 minutes  Indications: Donna Fisher presents with increasing pain of the right lower extremity.  She is developed ulceration of 2 of her toes on the right foot, this suggests the patient is having limb threatening ischemia. The risks and benefits are reviewed all questions answered patient agrees to proceed.  Procedure:Donna Fisher is a 53 y.o. y.o. female who was identified and appropriate procedural time out was performed. The patient was then placed supine on the table and prepped and draped in the usual sterile fashion.   Ultrasound was placed in the sterile sleeve and the left groin was evaluated the left common femoral artery was echolucent and pulsatile indicating patency. Image was recorded for the permanent record and under real-time visualization a  microneedle was inserted into the common femoral artery followed by the microwire and then the micro-sheath. A J-wire was then advanced through the micro-sheath and a 5 [place] sheath was then inserted over a J-wire. J-wire was then advanced and a 5 French pigtail catheter was positioned at the level of T12.  AP projection of the aorta was then obtained. Pigtail catheter was repositioned to above the bifurcation and a LAO view of the pelvis was obtained. Subsequently a pigtail catheter with the stiff angle Glidewire was used to cross the aortic bifurcation the catheter wire were advanced down into the right distal external iliac artery. Oblique view of the femoral bifurcation was then obtained and subsequently the wire was reintroduced and the pigtail catheter negotiated into the SFA representing third order catheter placement. Distal runoff was then performed.  Diagnostic interpretation: The abdominal aorta is opacified with a bolus injection contrast.  It is free of hemodynamically significant lesions.  Single renal arteries are noted bilaterally no evidence of renal artery stenosis.  The common external and internal iliac arteries are widely patent bilaterally.  The right common femoral profunda femoris superficial femoral and popliteal arteries are widely patent.  There is mild diffuse atherosclerotic changes but there are no hemodynamically significant stenoses.  The right anterior tibial artery is patent at its origin remains patent down to the foot.  The tibioperoneal trunk demonstrates a greater than 80% stenosis in its midportion.  The peroneal and posterior tibial are patent at their origin and the posterior tibial remains widely patent crossing the ankle and filling the pedal arch.  5000 units of heparin was then given and allowed to circulate and a 6 [place] Raby sheath was advanced up and over the bifurcation and positioned in the femoral artery  The detector was  then repositioned and the  distal popliteal artery and trifurcation was re-imaged.   Greater than 80% stenosis is noted in the tibioperoneal trunk.  A [place] core wire was then advanced through the lesion into the posterior tibial.  A 3 x 40 ultra score balloon was then advanced across the lesion and inflated to 8 atm for 1 minute. Follow-up imaging demonstrated patency with moderate residual stenosis and therefore a 4 mm x 40 mm Lutonix drug-eluting balloon is advanced across the lesion inflated to 6 atm for 2 full minutes.  Follow-up imaging demonstrates excellent result and less than 10% residual stenosis. Distal runoff was then reassessed and noted to be widely patent.   After review of these images the sheath is pulled into the left external iliac oblique of the common femoral is obtained and a Star close device deployed. There no immediate Complications.  Findings:  The abdominal aorta is opacified with a bolus injection contrast.  It is free of hemodynamically significant lesions.  Single renal arteries are noted bilaterally no evidence of renal artery stenosis.  The common external and internal iliac arteries are widely patent bilaterally.  The right common femoral profunda femoris superficial femoral and popliteal arteries are widely patent.  There is mild diffuse atherosclerotic changes but there are no hemodynamically significant stenoses.  The right anterior tibial artery is patent at its origin remains patent down to the foot.  The tibioperoneal trunk demonstrates a greater than 80% stenosis in its midportion.  The peroneal and posterior tibial are patent at their origin and the posterior tibial remains widely patent crossing the ankle and filling the pedal arch.  Following angioplasty the tibioperoneal trunk is now widely patent and demonstrates in-line flow.  It is much improved and looks quite nice with less than 10% residual stenosis.  Summary: Successful recanalization right lower extremity for limb  salvage   Disposition: Patient was taken to the recovery room in stable condition having tolerated the procedure well.  Donna Fisher 11/28/2018,2:37 PM

## 2018-11-29 ENCOUNTER — Encounter: Payer: Self-pay | Admitting: Vascular Surgery

## 2018-12-12 ENCOUNTER — Other Ambulatory Visit: Payer: Self-pay

## 2018-12-12 ENCOUNTER — Ambulatory Visit
Admission: RE | Admit: 2018-12-12 | Discharge: 2018-12-12 | Disposition: A | Discharge status: Home / Self Care | Payer: Medicaid Other | Source: Ambulatory Visit | Attending: Internal Medicine | Admitting: Internal Medicine

## 2018-12-12 DIAGNOSIS — E118 Type 2 diabetes mellitus with unspecified complications: Secondary | ICD-10-CM | POA: Diagnosis present

## 2018-12-14 ENCOUNTER — Other Ambulatory Visit (INDEPENDENT_AMBULATORY_CARE_PROVIDER_SITE_OTHER): Payer: Self-pay | Admitting: Vascular Surgery

## 2018-12-14 DIAGNOSIS — Z9862 Peripheral vascular angioplasty status: Secondary | ICD-10-CM

## 2018-12-15 ENCOUNTER — Encounter (INDEPENDENT_AMBULATORY_CARE_PROVIDER_SITE_OTHER): Payer: Self-pay | Admitting: Nurse Practitioner

## 2018-12-15 ENCOUNTER — Ambulatory Visit (INDEPENDENT_AMBULATORY_CARE_PROVIDER_SITE_OTHER): Payer: Medicaid Other

## 2018-12-15 ENCOUNTER — Other Ambulatory Visit: Payer: Self-pay

## 2018-12-15 ENCOUNTER — Ambulatory Visit (INDEPENDENT_AMBULATORY_CARE_PROVIDER_SITE_OTHER): Payer: Medicaid Other | Admitting: Nurse Practitioner

## 2018-12-15 VITALS — BP 83/53 | HR 110 | Resp 16 | Ht 64.0 in | Wt 191.0 lb

## 2018-12-15 DIAGNOSIS — L97519 Non-pressure chronic ulcer of other part of right foot with unspecified severity: Secondary | ICD-10-CM

## 2018-12-15 DIAGNOSIS — F17200 Nicotine dependence, unspecified, uncomplicated: Secondary | ICD-10-CM

## 2018-12-15 DIAGNOSIS — E118 Type 2 diabetes mellitus with unspecified complications: Secondary | ICD-10-CM

## 2018-12-15 DIAGNOSIS — E1151 Type 2 diabetes mellitus with diabetic peripheral angiopathy without gangrene: Secondary | ICD-10-CM | POA: Diagnosis not present

## 2018-12-15 DIAGNOSIS — I70235 Atherosclerosis of native arteries of right leg with ulceration of other part of foot: Secondary | ICD-10-CM | POA: Diagnosis not present

## 2018-12-15 DIAGNOSIS — F17210 Nicotine dependence, cigarettes, uncomplicated: Secondary | ICD-10-CM

## 2018-12-15 DIAGNOSIS — E782 Mixed hyperlipidemia: Secondary | ICD-10-CM

## 2018-12-15 DIAGNOSIS — Z9862 Peripheral vascular angioplasty status: Secondary | ICD-10-CM | POA: Diagnosis not present

## 2018-12-15 DIAGNOSIS — Z794 Long term (current) use of insulin: Secondary | ICD-10-CM

## 2018-12-15 DIAGNOSIS — Z79899 Other long term (current) drug therapy: Secondary | ICD-10-CM

## 2018-12-25 ENCOUNTER — Encounter (INDEPENDENT_AMBULATORY_CARE_PROVIDER_SITE_OTHER): Payer: Self-pay | Admitting: Nurse Practitioner
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
## 2018-12-28 DIAGNOSIS — K5904 Chronic idiopathic constipation: Secondary | ICD-10-CM | POA: Insufficient documentation

## 2018-12-28 DIAGNOSIS — K5909 Other constipation: Secondary | ICD-10-CM | POA: Insufficient documentation

## 2018-12-28 DIAGNOSIS — Z7901 Long term (current) use of anticoagulants: Secondary | ICD-10-CM | POA: Insufficient documentation

## 2018-12-28 DIAGNOSIS — E1143 Type 2 diabetes mellitus with diabetic autonomic (poly)neuropathy: Secondary | ICD-10-CM | POA: Insufficient documentation

## 2019-01-15 ENCOUNTER — Other Ambulatory Visit: Payer: Self-pay

## 2019-01-15 ENCOUNTER — Inpatient Hospital Stay
Admission: AD | Admit: 2019-01-15 | Discharge: 2019-01-17 | DRG: 617 | Disposition: A | Discharge status: Home Health | Payer: Medicaid Other | Source: Ambulatory Visit | Attending: Internal Medicine | Admitting: Internal Medicine

## 2019-01-15 DIAGNOSIS — Z8249 Family history of ischemic heart disease and other diseases of the circulatory system: Secondary | ICD-10-CM

## 2019-01-15 DIAGNOSIS — E785 Hyperlipidemia, unspecified: Secondary | ICD-10-CM | POA: Diagnosis present

## 2019-01-15 DIAGNOSIS — B373 Candidiasis of vulva and vagina: Secondary | ICD-10-CM | POA: Diagnosis present

## 2019-01-15 DIAGNOSIS — E1152 Type 2 diabetes mellitus with diabetic peripheral angiopathy with gangrene: Secondary | ICD-10-CM | POA: Diagnosis present

## 2019-01-15 DIAGNOSIS — M86171 Other acute osteomyelitis, right ankle and foot: Secondary | ICD-10-CM | POA: Diagnosis present

## 2019-01-15 DIAGNOSIS — Z833 Family history of diabetes mellitus: Secondary | ICD-10-CM

## 2019-01-15 DIAGNOSIS — Z881 Allergy status to other antibiotic agents status: Secondary | ICD-10-CM | POA: Diagnosis not present

## 2019-01-15 DIAGNOSIS — Z880 Allergy status to penicillin: Secondary | ICD-10-CM

## 2019-01-15 DIAGNOSIS — Z20828 Contact with and (suspected) exposure to other viral communicable diseases: Secondary | ICD-10-CM | POA: Diagnosis present

## 2019-01-15 DIAGNOSIS — Z8673 Personal history of transient ischemic attack (TIA), and cerebral infarction without residual deficits: Secondary | ICD-10-CM

## 2019-01-15 DIAGNOSIS — I96 Gangrene, not elsewhere classified: Secondary | ICD-10-CM | POA: Diagnosis present

## 2019-01-15 DIAGNOSIS — Z7902 Long term (current) use of antithrombotics/antiplatelets: Secondary | ICD-10-CM | POA: Diagnosis not present

## 2019-01-15 DIAGNOSIS — Z803 Family history of malignant neoplasm of breast: Secondary | ICD-10-CM

## 2019-01-15 DIAGNOSIS — Z794 Long term (current) use of insulin: Secondary | ICD-10-CM | POA: Diagnosis not present

## 2019-01-15 DIAGNOSIS — E1140 Type 2 diabetes mellitus with diabetic neuropathy, unspecified: Secondary | ICD-10-CM | POA: Diagnosis present

## 2019-01-15 DIAGNOSIS — N183 Chronic kidney disease, stage 3 (moderate): Secondary | ICD-10-CM | POA: Diagnosis present

## 2019-01-15 DIAGNOSIS — E1169 Type 2 diabetes mellitus with other specified complication: Principal | ICD-10-CM | POA: Diagnosis present

## 2019-01-15 DIAGNOSIS — L98493 Non-pressure chronic ulcer of skin of other sites with necrosis of muscle: Secondary | ICD-10-CM | POA: Diagnosis present

## 2019-01-15 DIAGNOSIS — Z7982 Long term (current) use of aspirin: Secondary | ICD-10-CM

## 2019-01-15 DIAGNOSIS — K219 Gastro-esophageal reflux disease without esophagitis: Secondary | ICD-10-CM | POA: Diagnosis present

## 2019-01-15 DIAGNOSIS — F17210 Nicotine dependence, cigarettes, uncomplicated: Secondary | ICD-10-CM | POA: Diagnosis present

## 2019-01-15 DIAGNOSIS — Z885 Allergy status to narcotic agent status: Secondary | ICD-10-CM | POA: Diagnosis not present

## 2019-01-15 DIAGNOSIS — E1122 Type 2 diabetes mellitus with diabetic chronic kidney disease: Secondary | ICD-10-CM | POA: Diagnosis present

## 2019-01-15 DIAGNOSIS — E118 Type 2 diabetes mellitus with unspecified complications: Secondary | ICD-10-CM

## 2019-01-15 LAB — SARS CORONAVIRUS 2 BY RT PCR (HOSPITAL ORDER, PERFORMED IN ~~LOC~~ HOSPITAL LAB): SARS Coronavirus 2: NEGATIVE

## 2019-01-15 LAB — COMPREHENSIVE METABOLIC PANEL
ALT: 15 U/L (ref 0–44)
AST: 18 U/L (ref 15–41)
Albumin: 3.5 g/dL (ref 3.5–5.0)
Alkaline Phosphatase: 87 U/L (ref 38–126)
Anion gap: 9 (ref 5–15)
BUN: 24 mg/dL — ABNORMAL HIGH (ref 6–20)
CO2: 23 mmol/L (ref 22–32)
Calcium: 9.2 mg/dL (ref 8.9–10.3)
Chloride: 105 mmol/L (ref 98–111)
Creatinine, Ser: 1.86 mg/dL — ABNORMAL HIGH (ref 0.44–1.00)
GFR calc Af Amer: 35 mL/min — ABNORMAL LOW (ref >60)
GFR calc non Af Amer: 31 mL/min — ABNORMAL LOW (ref >60)
Glucose, Bld: 227 mg/dL — ABNORMAL HIGH (ref 70–99)
Potassium: 4.3 mmol/L (ref 3.5–5.1)
Sodium: 137 mmol/L (ref 135–145)
Total Bilirubin: 0.6 mg/dL (ref 0.3–1.2)
Total Protein: 8.5 g/dL — ABNORMAL HIGH (ref 6.5–8.1)

## 2019-01-15 LAB — PROTIME-INR
INR: 1 (ref 0.8–1.2)
Prothrombin Time: 13.3 seconds (ref 11.4–15.2)

## 2019-01-15 LAB — CBC
HCT: 36.3 % (ref 36.0–46.0)
Hemoglobin: 12 g/dL (ref 12.0–15.0)
MCH: 31.5 pg (ref 26.0–34.0)
MCHC: 33.1 g/dL (ref 30.0–36.0)
MCV: 95.3 fL (ref 80.0–100.0)
Platelets: 324 10*3/uL (ref 150–400)
RBC: 3.81 MIL/uL — ABNORMAL LOW (ref 3.87–5.11)
RDW: 12.9 % (ref 11.5–15.5)
WBC: 12.2 10*3/uL — ABNORMAL HIGH (ref 4.0–10.5)
nRBC: 0 % (ref 0.0–0.2)

## 2019-01-15 LAB — GLUCOSE, CAPILLARY
Glucose-Capillary: 210 mg/dL — ABNORMAL HIGH (ref 70–99)
Glucose-Capillary: 209 mg/dL — ABNORMAL HIGH (ref 70–99)
Glucose-Capillary: 264 mg/dL — ABNORMAL HIGH (ref 70–99)

## 2019-01-15 MED ORDER — HYDROCODONE-ACETAMINOPHEN 5-325 MG PO TABS
1.0000 | ORAL_TABLET | Freq: Four times a day (QID) | ORAL | Status: DC | PRN
  Administered 2019-01-15: 1 via ORAL
  Filled 2019-01-15: qty 1

## 2019-01-15 MED ORDER — SODIUM CHLORIDE 0.9 % IV SOLN
2.0000 g | Freq: Once | INTRAVENOUS | Status: DC
  Filled 2019-01-15: qty 2

## 2019-01-15 MED ORDER — METRONIDAZOLE IN NACL 5-0.79 MG/ML-% IV SOLN
500.0000 mg | Freq: Three times a day (TID) | INTRAVENOUS | Status: DC
  Administered 2019-01-15 – 2019-01-17 (×5): 500 mg via INTRAVENOUS
  Filled 2019-01-15 (×8): qty 100

## 2019-01-15 MED ORDER — GABAPENTIN 100 MG PO CAPS
100.0000 mg | ORAL_CAPSULE | Freq: Every day | ORAL | Status: DC
  Administered 2019-01-16 – 2019-01-17 (×2): 100 mg via ORAL
  Filled 2019-01-15 (×2): qty 1

## 2019-01-15 MED ORDER — ONDANSETRON HCL 4 MG PO TABS: 4.0000 mg | ORAL_TABLET | Freq: Four times a day (QID) | ORAL | Status: DC | PRN

## 2019-01-15 MED ORDER — SITAGLIP PHOS-METFORMIN HCL ER 100-1000 MG PO TB24: 1.0000 | ORAL_TABLET | Freq: Every day | ORAL | Status: DC

## 2019-01-15 MED ORDER — ONDANSETRON HCL 4 MG/2ML IJ SOLN: 4.0000 mg | Freq: Four times a day (QID) | INTRAMUSCULAR | Status: DC | PRN

## 2019-01-15 MED ORDER — VANCOMYCIN HCL 10 G IV SOLR
1750.0000 mg | Freq: Once | INTRAVENOUS | Status: AC
  Administered 2019-01-15: 14:00:00 1750 mg via INTRAVENOUS
  Filled 2019-01-15: qty 1750

## 2019-01-15 MED ORDER — ACETAMINOPHEN 325 MG PO TABS: 650.0000 mg | ORAL_TABLET | Freq: Four times a day (QID) | ORAL | Status: DC | PRN

## 2019-01-15 MED ORDER — INSULIN ASPART 100 UNIT/ML ~~LOC~~ SOLN
0.0000 [IU] | Freq: Every day | SUBCUTANEOUS | Status: DC
  Administered 2019-01-15: 3 [IU] via SUBCUTANEOUS
  Filled 2019-01-15: qty 1

## 2019-01-15 MED ORDER — GLIPIZIDE ER 5 MG PO TB24
5.0000 mg | ORAL_TABLET | Freq: Every day | ORAL | Status: DC
  Filled 2019-01-15: qty 1

## 2019-01-15 MED ORDER — INSULIN ASPART 100 UNIT/ML ~~LOC~~ SOLN
0.0000 [IU] | Freq: Three times a day (TID) | SUBCUTANEOUS | Status: DC
  Administered 2019-01-15: 0 [IU] via SUBCUTANEOUS
  Administered 2019-01-15: 17:00:00 5 [IU] via SUBCUTANEOUS
  Administered 2019-01-16: 18:00:00 15 [IU] via SUBCUTANEOUS
  Administered 2019-01-16: 09:00:00 5 [IU] via SUBCUTANEOUS
  Filled 2019-01-15 (×4): qty 1

## 2019-01-15 MED ORDER — ACETAMINOPHEN 650 MG RE SUPP: 650.0000 mg | Freq: Four times a day (QID) | RECTAL | Status: DC | PRN

## 2019-01-15 MED ORDER — SODIUM CHLORIDE 0.9% FLUSH: 3.0000 mL | INTRAVENOUS | Status: DC | PRN

## 2019-01-15 MED ORDER — SENNOSIDES-DOCUSATE SODIUM 8.6-50 MG PO TABS: 1.0000 | ORAL_TABLET | Freq: Every evening | ORAL | Status: DC | PRN

## 2019-01-15 MED ORDER — VANCOMYCIN HCL IN DEXTROSE 1-5 GM/200ML-% IV SOLN
1000.0000 mg | INTRAVENOUS | Status: DC
  Filled 2019-01-15: qty 200

## 2019-01-15 MED ORDER — AMITRIPTYLINE HCL 25 MG PO TABS: 25.0000 mg | ORAL_TABLET | Freq: Every day | ORAL | Status: DC | PRN

## 2019-01-15 MED ORDER — ENOXAPARIN SODIUM 40 MG/0.4ML ~~LOC~~ SOLN: 40.0000 mg | SUBCUTANEOUS | Status: DC

## 2019-01-15 MED ORDER — GABAPENTIN 300 MG PO CAPS
300.0000 mg | ORAL_CAPSULE | Freq: Every day | ORAL | Status: DC
  Administered 2019-01-15 – 2019-01-16 (×2): 300 mg via ORAL
  Filled 2019-01-15 (×2): qty 1

## 2019-01-15 MED ORDER — ENOXAPARIN SODIUM 40 MG/0.4ML ~~LOC~~ SOLN
40.0000 mg | SUBCUTANEOUS | Status: DC
  Filled 2019-01-15: qty 0.4

## 2019-01-15 MED ORDER — SODIUM CHLORIDE 0.9 % IV SOLN
2.0000 g | Freq: Three times a day (TID) | INTRAVENOUS | Status: DC
  Administered 2019-01-15 – 2019-01-17 (×5): 2 g via INTRAVENOUS
  Filled 2019-01-15 (×8): qty 2

## 2019-01-15 MED ORDER — NICOTINE 21 MG/24HR TD PT24: 21.0000 mg | MEDICATED_PATCH | Freq: Every day | TRANSDERMAL | Status: DC

## 2019-01-15 MED ORDER — LINAGLIPTIN 5 MG PO TABS
5.0000 mg | ORAL_TABLET | Freq: Every day | ORAL | Status: DC
  Filled 2019-01-15 (×2): qty 1

## 2019-01-15 MED ORDER — METFORMIN HCL ER 500 MG PO TB24
1000.0000 mg | ORAL_TABLET | Freq: Every day | ORAL | Status: DC
  Filled 2019-01-15 (×2): qty 2

## 2019-01-15 MED ORDER — SODIUM CHLORIDE 0.9 % IV SOLN
250.0000 mL | INTRAVENOUS | Status: DC | PRN
  Administered 2019-01-16: 21:00:00 250 mL via INTRAVENOUS
  Administered 2019-01-16: 12:00:00 via INTRAVENOUS

## 2019-01-15 MED ORDER — MIDODRINE HCL 5 MG PO TABS
10.0000 mg | ORAL_TABLET | Freq: Three times a day (TID) | ORAL | Status: DC
  Administered 2019-01-17: 10 mg via ORAL
  Filled 2019-01-15 (×3): qty 2

## 2019-01-15 MED ORDER — PANTOPRAZOLE SODIUM 40 MG PO TBEC
40.0000 mg | DELAYED_RELEASE_TABLET | Freq: Every day | ORAL | Status: DC
  Administered 2019-01-15 – 2019-01-17 (×2): 40 mg via ORAL
  Filled 2019-01-15 (×2): qty 1

## 2019-01-15 MED ORDER — SODIUM CHLORIDE 0.9% FLUSH
3.0000 mL | Freq: Two times a day (BID) | INTRAVENOUS | Status: DC
  Administered 2019-01-15: 3 mL via INTRAVENOUS
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
## 2019-01-15 NOTE — Progress Notes (Signed)
Advanced care plan. Purpose of the Encounter: CODE STATUS Parties in Attendance: Patient Patient's Decision Capacity: Good Subjective/Patient's story: Donna Fisher  is a 53 y.o. female with a known history of diabetic neuropathy, type 2 diabetes mellitus, TIA in the past, UTI was referred from podiatry clinic for right second toe infection.  Patient has gangrene of the right second toe.   This has been going on for the last couple of months.  Has pain which is aching in nature 4 of 10 on a scale of 1-10.  No fever or chills.  No recent travel, sick contacts.  No complaints of cough, fever and myalgias. Objective/Medical story Patient needs podiatry evaluation possible amputation of the toe.  Needs IV antibiotics. Goals of care determination:  Advance care directives goals of care treatment plan discussed No patient wants everything done which includes CPR, intubation ventilator if the need arises CODE STATUS: Full code Time spent discussing advanced care planning: 16 minutes

## 2019-01-15 NOTE — Progress Notes (Signed)
Pharmacy Antibiotic Note  Donna Fisher is a 53 y.o. female admitted on 01/15/2019 with gangrenous toe.  Pharmacy has been consulted for Vancomycin and Aztreonam dosing.  Plan: 1) Vancomycin 1750mg  IV loading dose followed by     Vancomycin 1000 mg IV Q 36 hrs. Goal AUC 400-550.     Expected AUC: 209.2     SCr used: 1.86     Expected Cmin: 12.2  2) Patient with listed allergies of PCN (swelling) and Ceftriaxone (rash). Will continue with Aztreonam 2g IV q8h.  3) Sent message to Dr. Estanislado Pandy to add Metronidazole 500mg  IV q8h to provide anaerobic coverage. He was in agreement.  Height: 5' 4.5" (163.8 cm) IBW/kg (Calculated) : 55.85  Temp (24hrs), Avg:98 F (36.7 C), Min:98 F (36.7 C), Max:98 F (36.7 C)  Recent Labs  Lab 01/15/19 1117  WBC 12.2*  CREATININE 1.86*    CrCl cannot be calculated (Unknown ideal weight.).    Allergies  Allergen Reactions  . Penicillins Swelling    .Has patient had a PCN reaction causing immediate rash, facial/tongue/throat swelling, SOB or lightheadedness with hypotension: No Has patient had a PCN reaction causing severe rash involving mucus membranes or skin necrosis: No Has patient had a PCN reaction that required hospitalization: No Has patient had a PCN reaction occurring within the last 10 years: No If all of the above answers are "NO", then may proceed with Cephalosporin use.   . Codeine Itching  . Ceftriaxone Rash    Antimicrobials this admission: Vanomcyin 6/29 >>  Aztreonam 6/29 >>  Metronidazole 6/29 >>  Thank you for allowing pharmacy to be a part of this patient's care.  Paulina Fusi, PharmD, BCPS 01/15/2019 1:23 PM
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
## 2019-01-16 ENCOUNTER — Inpatient Hospital Stay: Payer: Medicaid Other | Admitting: Anesthesiology

## 2019-01-16 ENCOUNTER — Encounter
Admission: AD | Disposition: A | Discharge status: Home Health | Payer: Self-pay | Source: Ambulatory Visit | Attending: Internal Medicine

## 2019-01-16 ENCOUNTER — Encounter: Payer: Self-pay | Admitting: *Deleted

## 2019-01-16 HISTORY — PX: AMPUTATION TOE: SHX6595

## 2019-01-16 LAB — GLUCOSE, CAPILLARY
Glucose-Capillary: 239 mg/dL — ABNORMAL HIGH (ref 70–99)
Glucose-Capillary: 163 mg/dL — ABNORMAL HIGH (ref 70–99)
Glucose-Capillary: 139 mg/dL — ABNORMAL HIGH (ref 70–99)
Glucose-Capillary: 596 mg/dL (ref 70–99)
Glucose-Capillary: 581 mg/dL (ref 70–99)

## 2019-01-16 LAB — CBC
HCT: 32.4 % — ABNORMAL LOW (ref 36.0–46.0)
Hemoglobin: 10.7 g/dL — ABNORMAL LOW (ref 12.0–15.0)
MCH: 31.7 pg (ref 26.0–34.0)
MCHC: 33 g/dL (ref 30.0–36.0)
MCV: 95.9 fL (ref 80.0–100.0)
Platelets: 252 10*3/uL (ref 150–400)
RBC: 3.38 MIL/uL — ABNORMAL LOW (ref 3.87–5.11)
RDW: 12.6 % (ref 11.5–15.5)
WBC: 9.8 10*3/uL (ref 4.0–10.5)
nRBC: 0 % (ref 0.0–0.2)

## 2019-01-16 LAB — BASIC METABOLIC PANEL
Anion gap: 6 (ref 5–15)
BUN: 22 mg/dL — ABNORMAL HIGH (ref 6–20)
CO2: 26 mmol/L (ref 22–32)
Calcium: 8.2 mg/dL — ABNORMAL LOW (ref 8.9–10.3)
Chloride: 104 mmol/L (ref 98–111)
Creatinine, Ser: 1.58 mg/dL — ABNORMAL HIGH (ref 0.44–1.00)
GFR calc Af Amer: 43 mL/min — ABNORMAL LOW (ref >60)
GFR calc non Af Amer: 37 mL/min — ABNORMAL LOW (ref >60)
Glucose, Bld: 323 mg/dL — ABNORMAL HIGH (ref 70–99)
Potassium: 4.2 mmol/L (ref 3.5–5.1)
Sodium: 136 mmol/L (ref 135–145)

## 2019-01-16 LAB — HIV ANTIBODY (ROUTINE TESTING W REFLEX): HIV Screen 4th Generation wRfx: NONREACTIVE

## 2019-01-16 LAB — HEMOGLOBIN A1C
Hgb A1c MFr Bld: 10.1 % — ABNORMAL HIGH (ref 4.8–5.6)
Mean Plasma Glucose: 243 mg/dL

## 2019-01-16 SURGERY — AMPUTATION, TOE
Anesthesia: General | Laterality: Right

## 2019-01-16 MED ORDER — LIDOCAINE HCL (PF) 2 % IJ SOLN
INTRAMUSCULAR | Status: AC
  Filled 2019-01-16: qty 10

## 2019-01-16 MED ORDER — NEOMYCIN-POLYMYXIN B GU 40-200000 IR SOLN
Status: DC | PRN
  Administered 2019-01-16: 2 mL

## 2019-01-16 MED ORDER — OXYCODONE HCL 5 MG/5ML PO SOLN: 5.0000 mg | Freq: Once | ORAL | Status: DC | PRN

## 2019-01-16 MED ORDER — VANCOMYCIN HCL IN DEXTROSE 750-5 MG/150ML-% IV SOLN
750.0000 mg | INTRAVENOUS | Status: DC
  Filled 2019-01-16 (×2): qty 150

## 2019-01-16 MED ORDER — INSULIN REGULAR HUMAN 100 UNIT/ML IJ SOLN
10.0000 [IU] | Freq: Once | INTRAMUSCULAR | Status: AC
  Administered 2019-01-16: 10 [IU] via INTRAVENOUS
  Filled 2019-01-16: qty 10

## 2019-01-16 MED ORDER — SUCCINYLCHOLINE CHLORIDE 20 MG/ML IJ SOLN
INTRAMUSCULAR | Status: DC | PRN
  Administered 2019-01-16: 100 mg via INTRAVENOUS

## 2019-01-16 MED ORDER — OXYCODONE HCL 5 MG PO TABS: 5.0000 mg | ORAL_TABLET | Freq: Once | ORAL | Status: DC | PRN

## 2019-01-16 MED ORDER — FENTANYL CITRATE (PF) 100 MCG/2ML IJ SOLN
INTRAMUSCULAR | Status: AC
  Filled 2019-01-16: qty 2

## 2019-01-16 MED ORDER — MIDAZOLAM HCL 2 MG/2ML IJ SOLN
INTRAMUSCULAR | Status: DC | PRN
  Administered 2019-01-16: 2 mg via INTRAVENOUS

## 2019-01-16 MED ORDER — NEOMYCIN-POLYMYXIN B GU 40-200000 IR SOLN
Status: AC
  Filled 2019-01-16: qty 2

## 2019-01-16 MED ORDER — DEXAMETHASONE SODIUM PHOSPHATE 10 MG/ML IJ SOLN
INTRAMUSCULAR | Status: AC
  Filled 2019-01-16: qty 1

## 2019-01-16 MED ORDER — INSULIN ASPART 100 UNIT/ML ~~LOC~~ SOLN
0.0000 [IU] | Freq: Three times a day (TID) | SUBCUTANEOUS | Status: DC
  Administered 2019-01-17: 09:00:00 15 [IU] via SUBCUTANEOUS
  Filled 2019-01-16 (×2): qty 1

## 2019-01-16 MED ORDER — ONDANSETRON HCL 4 MG/2ML IJ SOLN
INTRAMUSCULAR | Status: DC | PRN
  Administered 2019-01-16: 4 mg via INTRAVENOUS

## 2019-01-16 MED ORDER — ONDANSETRON HCL 4 MG/2ML IJ SOLN
INTRAMUSCULAR | Status: AC
  Filled 2019-01-16: qty 2

## 2019-01-16 MED ORDER — BUPIVACAINE HCL 0.5 % IJ SOLN
INTRAMUSCULAR | Status: DC | PRN
  Administered 2019-01-16: 19 mL

## 2019-01-16 MED ORDER — FENTANYL CITRATE (PF) 100 MCG/2ML IJ SOLN: 25.0000 ug | INTRAMUSCULAR | Status: DC | PRN

## 2019-01-16 MED ORDER — SUCCINYLCHOLINE CHLORIDE 20 MG/ML IJ SOLN
INTRAMUSCULAR | Status: AC
  Filled 2019-01-16: qty 1

## 2019-01-16 MED ORDER — INSULIN GLARGINE 100 UNIT/ML ~~LOC~~ SOLN
14.0000 [IU] | Freq: Every day | SUBCUTANEOUS | Status: DC
  Administered 2019-01-16: 21:00:00 14 [IU] via SUBCUTANEOUS
  Filled 2019-01-16: qty 0.14

## 2019-01-16 MED ORDER — GLYCOPYRROLATE 0.2 MG/ML IJ SOLN
INTRAMUSCULAR | Status: DC | PRN
  Administered 2019-01-16: 0.2 mg via INTRAVENOUS

## 2019-01-16 MED ORDER — MIDAZOLAM HCL 2 MG/2ML IJ SOLN
INTRAMUSCULAR | Status: AC
  Filled 2019-01-16: qty 2

## 2019-01-16 MED ORDER — MENTHOL 3 MG MT LOZG
1.0000 | LOZENGE | OROMUCOSAL | Status: DC | PRN
  Administered 2019-01-16 – 2019-01-17 (×3): 3 mg via ORAL
  Filled 2019-01-16 (×2): qty 9

## 2019-01-16 MED ORDER — LIDOCAINE HCL (CARDIAC) PF 100 MG/5ML IV SOSY
PREFILLED_SYRINGE | INTRAVENOUS | Status: DC | PRN
  Administered 2019-01-16: 50 mg via INTRAVENOUS

## 2019-01-16 MED ORDER — BUPIVACAINE HCL (PF) 0.5 % IJ SOLN
INTRAMUSCULAR | Status: AC
  Filled 2019-01-16: qty 30

## 2019-01-16 MED ORDER — PHENYLEPHRINE HCL (PRESSORS) 10 MG/ML IV SOLN
INTRAVENOUS | Status: DC | PRN
  Administered 2019-01-16 (×5): 100 ug via INTRAVENOUS

## 2019-01-16 MED ORDER — GLYCOPYRROLATE 0.2 MG/ML IJ SOLN
INTRAMUSCULAR | Status: AC
  Filled 2019-01-16: qty 1

## 2019-01-16 MED ORDER — FENTANYL CITRATE (PF) 100 MCG/2ML IJ SOLN
INTRAMUSCULAR | Status: DC | PRN
  Administered 2019-01-16 (×2): 50 ug via INTRAVENOUS

## 2019-01-16 MED ORDER — INSULIN ASPART 100 UNIT/ML ~~LOC~~ SOLN
4.0000 [IU] | Freq: Three times a day (TID) | SUBCUTANEOUS | Status: DC
  Administered 2019-01-17 (×2): 4 [IU] via SUBCUTANEOUS
  Filled 2019-01-16 (×2): qty 1

## 2019-01-16 MED ORDER — MORPHINE SULFATE (PF) 2 MG/ML IV SOLN: 2.0000 mg | INTRAVENOUS | Status: DC | PRN

## 2019-01-16 MED ORDER — DEXAMETHASONE SODIUM PHOSPHATE 10 MG/ML IJ SOLN
INTRAMUSCULAR | Status: DC | PRN
  Administered 2019-01-16: 5 mg via INTRAVENOUS

## 2019-01-16 MED ORDER — HYDROCODONE-ACETAMINOPHEN 5-325 MG PO TABS
1.0000 | ORAL_TABLET | ORAL | Status: DC | PRN
  Administered 2019-01-16 – 2019-01-17 (×4): 1 via ORAL
  Filled 2019-01-16 (×4): qty 1

## 2019-01-16 MED ORDER — PROPOFOL 10 MG/ML IV BOLUS
INTRAVENOUS | Status: DC | PRN
  Administered 2019-01-16: 150 mg via INTRAVENOUS
  Administered 2019-01-16: 50 mg via INTRAVENOUS

## 2019-01-16 SURGICAL SUPPLY — 47 items
BANDAGE ACE 4X5 VEL STRL LF (GAUZE/BANDAGES/DRESSINGS) ×3 IMPLANT
BLADE MED AGGRESSIVE (BLADE) ×5 IMPLANT
BLADE OSC/SAGITTAL MD 5.5X18 (BLADE) ×3 IMPLANT
BLADE SURG 15 STRL LF DISP TIS (BLADE) ×2 IMPLANT
BLADE SURG 15 STRL SS (BLADE) ×4
BLADE SURG MINI STRL (BLADE) ×3 IMPLANT
BNDG CONFORM 2 STRL LF (GAUZE/BANDAGES/DRESSINGS) ×3 IMPLANT
BNDG ESMARK 4X12 TAN STRL LF (GAUZE/BANDAGES/DRESSINGS) ×3 IMPLANT
BNDG GAUZE 4.5X4.1 6PLY STRL (MISCELLANEOUS) ×3 IMPLANT
CANISTER SUCT 1200ML W/VALVE (MISCELLANEOUS) ×3 IMPLANT
CLOSURE WOUND 1/4X4 (GAUZE/BANDAGES/DRESSINGS) ×1
COVER WAND RF STERILE (DRAPES) ×3 IMPLANT
CUFF TOURN 18 STER (MISCELLANEOUS) ×3 IMPLANT
CUFF TOURN DUAL PL 12 NO SLV (MISCELLANEOUS) ×1 IMPLANT
DRAPE FLUOR MINI C-ARM 54X84 (DRAPES) ×3 IMPLANT
DURAPREP 26ML APPLICATOR (WOUND CARE) ×3 IMPLANT
ELECT REM PT RETURN 9FT ADLT (ELECTROSURGICAL) ×3
ELECTRODE REM PT RTRN 9FT ADLT (ELECTROSURGICAL) ×1 IMPLANT
GAUZE SPONGE 4X4 12PLY STRL (GAUZE/BANDAGES/DRESSINGS) ×3 IMPLANT
GAUZE XEROFORM 1X8 LF (GAUZE/BANDAGES/DRESSINGS) ×3 IMPLANT
GLOVE BIO SURGEON STRL SZ7.5 (GLOVE) ×3 IMPLANT
GLOVE INDICATOR 8.0 STRL GRN (GLOVE) ×3 IMPLANT
GOWN STRL REUS W/ TWL LRG LVL3 (GOWN DISPOSABLE) ×2 IMPLANT
GOWN STRL REUS W/TWL LRG LVL3 (GOWN DISPOSABLE) ×4
HANDPIECE VERSAJET DEBRIDEMENT (MISCELLANEOUS) ×3 IMPLANT
KIT TURNOVER KIT A (KITS) ×3 IMPLANT
LABEL OR SOLS (LABEL) ×3 IMPLANT
NDL FILTER BLUNT 18X1 1/2 (NEEDLE) ×1 IMPLANT
NDL HYPO 25X1 1.5 SAFETY (NEEDLE) ×2 IMPLANT
NEEDLE FILTER BLUNT 18X 1/2SAF (NEEDLE) ×2
NEEDLE FILTER BLUNT 18X1 1/2 (NEEDLE) ×1 IMPLANT
NEEDLE HYPO 25X1 1.5 SAFETY (NEEDLE) ×6 IMPLANT
NS IRRIG 500ML POUR BTL (IV SOLUTION) ×3 IMPLANT
PACK EXTREMITY ARMC (MISCELLANEOUS) ×3 IMPLANT
SOL .9 NS 3000ML IRR  AL (IV SOLUTION) ×2
SOL .9 NS 3000ML IRR UROMATIC (IV SOLUTION) ×1 IMPLANT
SOL PREP PVP 2OZ (MISCELLANEOUS) ×3
SOLUTION PREP PVP 2OZ (MISCELLANEOUS) ×1 IMPLANT
STOCKINETTE STRL 6IN 960660 (GAUZE/BANDAGES/DRESSINGS) ×3 IMPLANT
STRIP CLOSURE SKIN 1/4X4 (GAUZE/BANDAGES/DRESSINGS) ×2 IMPLANT
SUT ETHILON 3-0 FS-10 30 BLK (SUTURE) ×3
SUT ETHILON 4-0 (SUTURE)
SUT ETHILON 4-0 FS2 18XMFL BLK (SUTURE)
SUTURE EHLN 3-0 FS-10 30 BLK (SUTURE) ×1 IMPLANT
SUTURE ETHLN 4-0 FS2 18XMF BLK (SUTURE) IMPLANT
SWAB DUAL CULTURE TRANS RED ST (MISCELLANEOUS) ×3 IMPLANT
SYR 10ML LL (SYRINGE) ×3 IMPLANT

## 2019-01-16 NOTE — Progress Notes (Signed)
[facility] [place] NAME: Donna Fisher    MR#:  [id number]  DATE OF BIRTH:  10-03-1965  SUBJECTIVE:  CHIEF COMPLAINT:  No chief complaint on file.  - right foot in a dressing, s/p right 1st and 2nd toes amputated for unhealing infection  REVIEW OF SYSTEMS:  Review of Systems  Constitutional: Negative for chills, fever and malaise/fatigue.  HENT: Positive for sore throat.   Eyes: Negative for blurred vision and double vision.  Respiratory: Negative for cough, shortness of breath and wheezing.   Cardiovascular: Negative for chest pain and palpitations.  Gastrointestinal: Negative for abdominal pain, constipation, diarrhea, nausea and vomiting.  Genitourinary: Negative for dysuria.  Musculoskeletal: Positive for joint pain and myalgias.  Neurological: Negative for dizziness, focal weakness, seizures and headaches.  Psychiatric/Behavioral: Negative for depression.    DRUG ALLERGIES:   Allergies  Allergen Reactions  . Penicillins Swelling    .Has patient had a PCN reaction causing immediate rash, facial/tongue/throat swelling, SOB or lightheadedness with hypotension: No Has patient had a PCN reaction causing severe rash involving mucus membranes or skin necrosis: No Has patient had a PCN reaction that required hospitalization: No Has patient had a PCN reaction occurring within the last 10 years: No If all of the above answers are "NO", then may proceed with Cephalosporin use.   . Codeine Itching  . Ceftriaxone Rash    VITALS:  Blood pressure 127/69, pulse 94, temperature 98 F (36.7 C), resp. rate 20, height 5' 4.5" (1.638 m), weight 86.6 kg, SpO2 94 %.  PHYSICAL EXAMINATION:  Physical Exam   GENERAL:  53 y.o.-year-old patient sitting in the bed with no acute distress.  EYES: Pupils equal, round, reactive to light and accommodation. No scleral icterus. Extraocular muscles intact.  HEENT: Head atraumatic, normocephalic.  Oropharynx and nasopharynx clear.  NECK:  Supple, no jugular venous distention. No thyroid enlargement, no tenderness.  LUNGS: Normal breath sounds bilaterally, no wheezing, rales,rhonchi or crepitation. No use of accessory muscles of respiration.  CARDIOVASCULAR: S1, S2 normal. No murmurs, rubs, or gallops.  ABDOMEN: Soft, nontender, nondistended. Bowel sounds present. No organomegaly or mass.  EXTREMITIES: No pedal edema, cyanosis, or clubbing. Right foot in a dressing NEUROLOGIC: Cranial nerves II through XII are intact. Muscle strength 5/5 in all extremities. Sensation intact. Gait not checked.  PSYCHIATRIC: The patient is alert and oriented x 3.  SKIN: No obvious rash, lesion, or ulcer.    LABORATORY PANEL:   CBC Recent Labs  Lab 01/16/19 0310  WBC 9.8  HGB 10.7*  HCT 32.4*  PLT 252   ------------------------------------------------------------------------------------------------------------------  Chemistries  Recent Labs  Lab 01/15/19 1117 01/16/19 0310  NA 137 136  K 4.3 4.2  CL 105 104  CO2 23 26  GLUCOSE 227* 323*  BUN 24* 22*  CREATININE 1.86* 1.58*  CALCIUM 9.2 8.2*  AST 18  --   ALT 15  --   ALKPHOS 87  --   BILITOT 0.6  --    ------------------------------------------------------------------------------------------------------------------  Cardiac Enzymes No results for input(s): TROPONINI in the last 168 hours. ------------------------------------------------------------------------------------------------------------------  RADIOLOGY:  No results found.  EKG:   Orders placed or performed in visit on 06/20/18  . 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  1.  Right  foot gangrene of first and second  toes-nonhealing ulcerations.  Status post angiogram and balloon angioplasty as outpatient to the right leg. -Appreciate podiatry consult -Patient is status post amputation of right foot first and second toes secondary to chronic ulceration with gangrenous changes. -Currently on IV antibiotics with vancomycin, aztreonam and Flagyl -We will check with podiatry within the next day or 2 about changing to oral antibiotics if healing well  2.  Diabetes mellitus with hyperglycemia-hold oral medications while in the hospital.  Started on Lantus and NovoLog and sliding scale insulin. -Check A1c  3.  CKD stage III-likely secondary to diabetes.  Creatinine seems to be stable.  Monitor for now  4.  GERD-Protonix  5.  Tobacco use disorder-on nicotine patch  6.  Neuropathy-gabapentin  7.  DVT prophylaxis-Lovenox   All the records are reviewed and case discussed with Care Management/Social Workerr. Management plans discussed with the patient, family and they are in agreement.  CODE STATUS: Full Code  TOTAL TIME TAKING CARE OF THIS PATIENT: 38 minutes.   POSSIBLE D/C IN 2 DAYS, DEPENDING ON CLINICAL CONDITION.   Gladstone Lighter M.D on 01/16/2019 at 2:53 PM  Between 7am to 6pm - Pager - [phone]  After 6pm go to [email or web address] - password [email or web address] [facility]  Office  [phone]  CC: Primary care physician; [facility], [facility]
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
## 2019-01-16 NOTE — Interval H&P Note (Signed)
History and Physical Interval Note:  01/16/2019 11:31 AM  Donna Fisher  has presented today for surgery, with the diagnosis of N/A.  The various methods of treatment have been discussed with the patient and family. After consideration of risks, benefits and other options for treatment, the patient has consented to  Procedure(s): AMPUTATION TOE 1ST AND 2ND (Right) as a surgical intervention.  The patient's history has been reviewed, patient examined, no change in status, stable for surgery.  I have reviewed the patient's chart and labs.  Questions were answered to the patient's satisfaction.     [name]

## 2019-01-16 NOTE — TOC Initial Note (Signed)
Transition of Care [facility]) - Initial/Assessment Note    Patient Details  Name: Donna Fisher MRN: [id number] Date of Birth: 10-Mar-1966  Transition of Care [facility]) CM/SW Contact:    Su Hilt, RN Phone Number: 01/16/2019, 2:43 PM  Clinical Narrative:                  Met with the patient to discuss Discharge plan and needs She lives with her Boyfriend [name] and he provides care and transportation if needed She goes to Dr Nicki Reaper as PCP and is current with visits Uses [place] or [place] Can afford her medications She has a RW at home but would benefit from a 3 in 1 Notified brad with [employer] Will; continue to monitor for further needs and possible IV ABX  Expected Discharge Plan: [place] Barriers to Discharge: Continued Medical Work up   Patient Goals and CMS Choice   CMS Medicare.gov Compare Post Acute Care list provided to:: Patient Choice offered to / list presented to : Patient  Expected Discharge Plan and Services Expected Discharge Plan: [place]   Discharge Planning Services: CM Consult Post Acute Care Choice: [place] arrangements for the past 2 months: Single Family Home                 DME Arranged: 3-N-1 DME Agency: [employer] Date DME Agency Contacted: 01/16/19 Time DME Agency Contacted: [phone] Representative spoke with at DME Agency: [place] Arrangements/Services Living arrangements for the past 2 months: [place] with:: Significant Other([name]) Patient language and need for interpreter reviewed:: No Do you feel safe going back to the place where you live?: Yes      Need for Family Participation in Patient Care: No (Comment) Care giver support system in place?: Yes (comment) Current home services: DME(rolling walker) Criminal Activity/Legal Involvement Pertinent to Current Situation/Hospitalization: No - Comment as needed  Activities of  Daily Living Home Assistive Devices/Equipment: None ADL Screening (condition at time of admission) Patient's cognitive ability adequate to safely complete daily activities?: Yes Is the patient deaf or have difficulty hearing?: No Does the patient have difficulty seeing, even when wearing glasses/contacts?: No Does the patient have difficulty concentrating, remembering, or making decisions?: No Patient able to express need for assistance with ADLs?: Yes Does the patient have difficulty dressing or bathing?: No Independently performs ADLs?: Yes (appropriate for developmental age) Does the patient have difficulty walking or climbing stairs?: No Weakness of Legs: None Weakness of Arms/Hands: None  Permission Sought/Granted Permission sought to share information with : Case Manager       Permission granted to share info w AGENCY: Home Health Agency        Emotional Assessment Appearance:: Appears stated age Attitude/Demeanor/Rapport: Engaged Affect (typically observed): Accepting Orientation: : Oriented to Self, Oriented to Place, Oriented to  Time, Oriented to Situation Alcohol / Substance Use: Not Applicable Psych Involvement: No (comment)  Admission diagnosis:  Gangrene of toe Patient Active Problem List   Diagnosis Date Noted  . Type 2 diabetes mellitus with diabetic peripheral angiopathy with gangrene ([place]) 01/15/2019  . Gangrene ([place]) 01/15/2019  . 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 PAD (peripheral artery disease) ([place]) 11/22/2018  . Atherosclerotic peripheral vascular disease with ulceration ([place]) 11/22/2018  . Diabetes mellitus type 2 with complications ([place]) 73/22/0254  . Hypotension 06/20/2018  . Smoker 06/20/2018  .  Hyperlipidemia 06/20/2018  . Cellulitis of left foot 12/31/2016  . Headache 02/06/2013   PCP:  [facility], [place]:   [address], [place]  [address] Phone: [phone] Fax: [phone]     Social Determinants of Health (SDOH) Interventions    Readmission Risk Interventions No flowsheet data found.

## 2019-01-16 NOTE — Anesthesia Procedure Notes (Signed)
Procedure Name: Intubation Performed by: Rolla Plate, CRNA Pre-anesthesia Checklist: Patient identified, Patient being monitored, Timeout performed, Emergency Drugs available and Suction available Patient Re-evaluated:Patient Re-evaluated prior to induction Oxygen Delivery Method: Circle system utilized Preoxygenation: Pre-oxygenation with 100% oxygen Induction Type: IV induction Ventilation: Mask ventilation without difficulty Laryngoscope Size: 3 and McGraph Grade View: Grade I Tube type: Oral Tube size: 7.0 mm Number of attempts: 1 Airway Equipment and Method: Stylet and Video-laryngoscopy Placement Confirmation: ETT inserted through vocal cords under direct vision,  positive ETCO2 and breath sounds checked- equal and bilateral Secured at: 21 cm Tube secured with: Tape Dental Injury: Teeth and Oropharynx as per pre-operative assessment  Difficulty Due To: Difficulty was anticipated and Difficult Airway- due to dentition Future Recommendations: Recommend- induction with short-acting agent, and alternative techniques readily available

## 2019-01-16 NOTE — Transfer of Care (Signed)
Immediate Anesthesia Transfer of Care Note  Patient: Donna Fisher  Procedure(s) Performed: AMPUTATION TOE 1ST AND 2ND (Right )  Patient Location: PACU  Anesthesia Type:General  Level of Consciousness: sedated  Airway & Oxygen Therapy: Patient Spontanous Breathing and Patient connected to face mask oxygen  Post-op Assessment: Report given to RN and Post -op Vital signs reviewed and stable  Post vital signs: Reviewed  Last Vitals:  Vitals Value Taken Time  BP 141/84 01/16/19 1325  Temp    Pulse 93 01/16/19 1325  Resp 13 01/16/19 1325  SpO2 100 % 01/16/19 1325  Vitals shown include unvalidated device data.  Last Pain:  Vitals:   01/16/19 1103  TempSrc: Temporal  PainSc: 5          Complications: No apparent anesthesia complications

## 2019-01-16 NOTE — Progress Notes (Signed)
Pharmacy Antibiotic Note  Donna Fisher is a 53 y.o. female admitted on 01/15/2019 with gangrenous toe.  Pharmacy has been consulted for Vancomycin and Aztreonam dosing.  Plan: 1) Vancomycin 1750mg  IV loading dose followed by     Vancomycin 1000 mg IV Q 36 hrs. Goal AUC 400-550.     Expected AUC: 209.2     SCr used: 1.86     Expected Cmin: 12.2 2) Patient with listed allergies of PCN (swelling) and Ceftriaxone (rash). Will continue with Aztreonam 2g IV q8h. 3) Sent message to Dr. Estanislado Pandy to add Metronidazole 500mg  IV q8h to provide anaerobic coverage. He was in agreement.   6/30: Scr improved 1.86>1.58. Vancomycin dose adjusted to 750mg  IV q24h.  Expected AUC 496   Scr 1.58.  For toe amputations today.     Height: 5' 4.5" (163.8 cm) Weight: 190 lb 14.4 oz (86.6 kg) IBW/kg (Calculated) : 55.85  Temp (24hrs), Avg:98 F (36.7 C), Min:97.7 F (36.5 C), Max:98.2 F (36.8 C)  Recent Labs  Lab 01/15/19 1117 01/16/19 0310  WBC 12.2* 9.8  CREATININE 1.86* 1.58*    Estimated Creatinine Clearance: 44.8 mL/min (A) (by C-G formula based on SCr of 1.58 mg/dL (H)).    Allergies  Allergen Reactions  . Penicillins Swelling    .Has patient had a PCN reaction causing immediate rash, facial/tongue/throat swelling, SOB or lightheadedness with hypotension: No Has patient had a PCN reaction causing severe rash involving mucus membranes or skin necrosis: No Has patient had a PCN reaction that required hospitalization: No Has patient had a PCN reaction occurring within the last 10 years: No If all of the above answers are "NO", then may proceed with Cephalosporin use.   . Codeine Itching  . Ceftriaxone Rash    Antimicrobials this admission: Vanomcyin 6/29 >>  Aztreonam 6/29 >>  Metronidazole 6/29 >>  Thank you for allowing pharmacy to be a part of this patient's care.  Chinita Greenland PharmD Clinical Pharmacist 01/16/2019

## 2019-01-16 NOTE — Progress Notes (Signed)
Inpatient Diabetes Program Recommendations  AACE/ADA: New Consensus Statement on Inpatient Glycemic Control  Target Ranges:  Prepandial:   less than 140 mg/dL      Peak postprandial:   less than 180 mg/dL (1-2 hours)      Critically ill patients:  140 - 180 mg/dL  Results for PRINCESS, KARNES (MRN [id number]) as of 01/16/2019 09:25  Ref. Range 01/15/2019 11:42 01/15/2019 16:55 01/15/2019 21:16 01/16/2019 07:41  Glucose-Capillary Latest Ref Range: 70 - 99 mg/dL 210 (H) 209 (H) 264 (H) 239 (H)   Results for KYSA, CALAIS (MRN [id number]) as of 01/16/2019 09:25  Ref. Range 01/01/2017 04:41 01/15/2019 11:17  Hemoglobin A1C Latest Ref Range: 4.8 - 5.6 % 10.8 (H) 10.1 (H)   Review of Glycemic Control  Diabetes history: DM2 Outpatient Diabetes medications:  Levemir 50 units BID, Victoza 1.8 mg QHS, Glipizide XL 5 mg QAM, Jardiance 10 mg daily Current orders for Inpatient glycemic control: Novolog 0-15 units TID with meals, Novolog 0-5 units QHS, Metformin XR 1000 mg QAM, Glipizide XL 5 mg QAM, Tradjeta 5 mg daily  Inpatient Diabetes Program Recommendations:   Insulin - Basal: Please consider ordering Levemir 13 units Q24H (based on 86.6kg x 0.15 units).  Insulin - Meal Coverage: Once diet is resumed, please consider ordering Novolog 4 units TID with meals for meal coverage if patient eats at least 50% of meals.  Oral Agents: While inpatient, please discontinue oral DM medications.  HgbA1C: A1C 10.1% on 01/15/19 indicating an average glucose of 243 mg/dl. Patient reports that A1C was 10.5% a few weeks ago.  NOTE: Spoke with patient over the phone about diabetes and home regimen for diabetes control. Patient reports being followed by [facility] for diabetes management and currently taking Levemir 50 units BID, Victoza 1.8 mg QHS, Glipizide XL 5 mg QAM, Jardiance 10 mg daily as an outpatient for diabetes control. Patient states that she was taking Janumet but it was discontinued about 3 weeks ago and  she was started on Jardiance 10 mg daily. Patient reports that the Janumet was stopped because she is having nausea, vomiting and recently dx with gastroparesis. Encouraged patient to ask PCP about Victoza to see if it needs to be stopped or not (due to GI side effects).  Patient reports that she is taking all DM medications as prescribed but she notes that sometimes she vomits in the morning and end up  throwing up the Glipizide and Jardiance. Patient reports checking glucose 2-3 times per day and that it is usually in the 200's mg/dl or higher.  Inquired about prior A1C and patient reports A1C was 10.5% a few weeks ago. Discussed A1C results (10.1% on 01/15/19 ) and explained that current A1C indicates an average glucose of 243 mg/dl over the past 2-3 months. Discussed glucose and A1C goals. Discussed importance of checking CBGs and maintaining good CBG control to prevent long-term and short-term complications. Explained how hyperglycemia leads to damage within blood vessels which lead to the common complications seen with uncontrolled diabetes. Stressed to the patient the importance of improving glycemic control to prevent further complications from uncontrolled diabetes. Discussed impact of nutrition, exercise, stress, sickness, and medications on diabetes control. Patient states she struggles with knowing what to eat for DM and gastroparesis. Informed patient that outpatient DM education would be ordered for assistance with proper diet.  Encouraged patient to ask PCP about referring her to an Endocrinologist for assistance with improving DM control.   Patient verbalized understanding of information  discussed and reports no further questions at this time related to diabetes.  Thanks, Barnie Alderman, RN, MSN, CDE Diabetes Coordinator Inpatient Diabetes Program [phone] (Team Pager)

## 2019-01-16 NOTE — Anesthesia Postprocedure Evaluation (Signed)
Anesthesia Post Note  Patient: Donna Fisher  Procedure(s) Performed: AMPUTATION TOE 1ST AND 2ND (Right )  Patient location during evaluation: PACU Anesthesia Type: General Level of consciousness: awake and alert and oriented Pain management: pain level controlled Vital Signs Assessment: post-procedure vital signs reviewed and stable Respiratory status: spontaneous breathing, nonlabored ventilation and respiratory function stable Cardiovascular status: blood pressure returned to baseline and stable Postop Assessment: no signs of nausea or vomiting Anesthetic complications: no     Last Vitals:  Vitals:   01/16/19 1414 01/16/19 1441  BP: 122/82 127/69  Pulse: 93 94  Resp: 14 20  Temp:    SpO2: 94%     Last Pain:  Vitals:   01/16/19 1414  TempSrc:   PainSc: 0-No pain                 Damica Gravlin

## 2019-01-16 NOTE — Op Note (Signed)
Date of operation: 01/16/2019.  Surgeon: [name] D.P.M.  Preoperative diagnosis: 1 gangrene right second toe. 2.  Full-thickness ulceration with necrosis of muscle right hallux.  Postoperative diagnosis: Same.  Procedures: 1.  Amputation right second toe. 2.  Amputation right hallux.  Anesthesia: General with local infiltration.  Hemostasis: Pneumatic tourniquet right ankle 250 mmHg.  Estimated blood loss: Less than 5 cc.  Pathology: Right first and second toes.  Cultures: Bone culture proximal phalanx right second toe.  Complications: None apparent.  Operative indications: This is a 53 year old female with some chronic problems with ulcerations on her right first and second toes over the last couple of months with progression to gangrenous changes.  Was admitted for amputation of the toes.  Operative procedure: Patient was taken to the operating room and placed on the table in the supine position.  Following satisfactory general anesthesia the right foot was anesthetized with 11 cc of 0.5% Marcaine plain.  A pneumatic tourniquet was applied at the level of the right ankle and the foot was prepped and draped in usual sterile fashion.  The foot was exsanguinated and the tourniquet inflated to 250 mmHg.     Attention was directed to the right foot where an elliptical incision was made around the medial and lateral base of the second toe from dorsal to plantar.  The incision was deepened down to the level of the joint sharply and the toe was disarticulated and removed in toe toe.  The wound was flushed with copious amounts of sterile saline and closed using 3-0 nylon vertical mattress and simple interrupted sutures.      Attention was then directed to the distal aspect of the right hallux where a fishmouth type incision was made coursing from medial to lateral around the dorsal and plantar base of the great toe.  The incision was deepened sharply down to bone and dissection carried back  proximal to the inner phalangeal joint where the toe was then transected through the proximal phalanx and the distal aspect was removed in toto.  The wound was flushed with copious amounts of sterile saline and closed using 3-0 nylon vertical mattress and simple interrupted sutures.  Xeroform 4 x 4's and con form applied to the right foot.  Tourniquet was released.  Kerlix and an Ace wrap then applied for additional coverage and compression.  Patient was awakened and transported to the PACU with vital signs stable and in good condition.

## 2019-01-16 NOTE — Anesthesia Post-op Follow-up Note (Signed)
Anesthesia QCDR form completed.        

## 2019-01-17 LAB — CBC
HCT: 33 % — ABNORMAL LOW (ref 36.0–46.0)
Hemoglobin: 11.1 g/dL — ABNORMAL LOW (ref 12.0–15.0)
MCH: 31.3 pg (ref 26.0–34.0)
MCHC: 33.6 g/dL (ref 30.0–36.0)
MCV: 93 fL (ref 80.0–100.0)
Platelets: 284 10*3/uL (ref 150–400)
RBC: 3.55 MIL/uL — ABNORMAL LOW (ref 3.87–5.11)
RDW: 12.5 % (ref 11.5–15.5)
WBC: 14.2 10*3/uL — ABNORMAL HIGH (ref 4.0–10.5)
nRBC: 0 % (ref 0.0–0.2)

## 2019-01-17 LAB — GLUCOSE, CAPILLARY
Glucose-Capillary: 378 mg/dL — ABNORMAL HIGH (ref 70–99)
Glucose-Capillary: 410 mg/dL — ABNORMAL HIGH (ref 70–99)
Glucose-Capillary: 437 mg/dL — ABNORMAL HIGH (ref 70–99)
Glucose-Capillary: 485 mg/dL — ABNORMAL HIGH (ref 70–99)

## 2019-01-17 LAB — BASIC METABOLIC PANEL
Anion gap: 9 (ref 5–15)
BUN: 22 mg/dL — ABNORMAL HIGH (ref 6–20)
CO2: 21 mmol/L — ABNORMAL LOW (ref 22–32)
Calcium: 8.4 mg/dL — ABNORMAL LOW (ref 8.9–10.3)
Chloride: 106 mmol/L (ref 98–111)
Creatinine, Ser: 1.58 mg/dL — ABNORMAL HIGH (ref 0.44–1.00)
GFR calc Af Amer: 43 mL/min — ABNORMAL LOW (ref >60)
GFR calc non Af Amer: 37 mL/min — ABNORMAL LOW (ref >60)
Glucose, Bld: 380 mg/dL — ABNORMAL HIGH (ref 70–99)
Potassium: 4.8 mmol/L (ref 3.5–5.1)
Sodium: 136 mmol/L (ref 135–145)

## 2019-01-17 MED ORDER — FLUCONAZOLE 150 MG PO TABS: 150.0000 mg | ORAL_TABLET | ORAL | 0 refills | Status: AC

## 2019-01-17 MED ORDER — FLUCONAZOLE 100 MG PO TABS
200.0000 mg | ORAL_TABLET | Freq: Every day | ORAL | Status: DC
  Administered 2019-01-17: 200 mg via ORAL
  Filled 2019-01-17: qty 2

## 2019-01-17 MED ORDER — INSULIN ASPART 100 UNIT/ML ~~LOC~~ SOLN
20.0000 [IU] | Freq: Once | SUBCUTANEOUS | Status: AC
  Administered 2019-01-17: 20 [IU] via SUBCUTANEOUS
  Filled 2019-01-17: qty 1

## 2019-01-17 MED ORDER — INSULIN DETEMIR 100 UNIT/ML ~~LOC~~ SOLN
15.0000 [IU] | Freq: Two times a day (BID) | SUBCUTANEOUS | Status: DC
  Administered 2019-01-17: 15 [IU] via SUBCUTANEOUS
  Filled 2019-01-17 (×2): qty 0.15

## 2019-01-17 MED ORDER — DOXYCYCLINE MONOHYDRATE 100 MG PO TABS: 100.0000 mg | ORAL_TABLET | Freq: Two times a day (BID) | ORAL | 0 refills | Status: AC

## 2019-01-17 MED ORDER — CIPROFLOXACIN HCL 500 MG PO TABS: 500.0000 mg | ORAL_TABLET | Freq: Two times a day (BID) | ORAL | 0 refills | Status: AC

## 2019-01-17 MED ORDER — HYDROCODONE-ACETAMINOPHEN 5-325 MG PO TABS: 1.0000 | ORAL_TABLET | ORAL | 0 refills | Status: DC | PRN

## 2019-01-17 NOTE — TOC Progression Note (Signed)
Transition of Care [facility]) - Progression Note    Patient Details  Name: Donna Fisher MRN: [id number] Date of Birth: 1966/02/23  Transition of Care [facility]) CM/SW Mathews, RN Phone Number: 01/17/2019, 10:02 AM  Clinical Narrative:    Will not need PT, OT, DME or Wound care, no IV ABX per physician.  The patient has a follow up appointment for the dressing change and will not need any services.  She has had the other foot done and has everything she needs   Expected Discharge Plan: [place] Barriers to Discharge: Continued Medical Work up  Expected Discharge Plan and Services Expected Discharge Plan: [place]   Discharge Planning Services: CM Consult Post Acute Care Choice: [place] arrangements for the past 2 months: Single Family Home                 DME Arranged: 3-N-1 DME Agency: [employer] Date DME Agency Contacted: 01/16/19 Time DME Agency Contacted: [phone] Representative spoke with at DME Agency: Brad             Social Determinants of Health ([place]) Interventions    Readmission Risk Interventions No flowsheet data found.

## 2019-01-17 NOTE — Progress Notes (Signed)
Discharge instructions and prescriptions reviewed with patient. Patient gave verbalization of understanding. Patient called her pharmacy to verify that prescriptions had been received. Patient with post-op shoe on and wheeled to [facility] for discharge.

## 2019-01-17 NOTE — Progress Notes (Signed)
1 Day Post-Op   Subjective/Chief Complaint: Patient seen.  Some pain at the amputation site.   Objective: Vital signs in last 24 hours: Temp:  [96.5 F (35.8 C)-98.4 F (36.9 C)] 98.3 F (36.8 C) (07/01 0822) Pulse Rate:  [82-101] 95 (07/01 0822) Resp:  [11-27] 17 (07/01 0822) BP: (103-153)/(63-90) 120/69 (07/01 0822) SpO2:  [94 %-100 %] 100 % (07/01 0822) Weight:  [86.6 kg] 86.6 kg (06/30 1103) Last BM Date: 01/15/19  Intake/Output from previous day: 06/30 0701 - 07/01 0700 In: 1596.1 [P.O.:240; I.V.:765.3; IV Piggyback:590.8] Out: -  Intake/Output this shift: No intake/output data recorded.  The bandage is dry and intact.  Some mild bleeding on the bandaging but no sign of any infection.  Minimal erythema at the second toe amputation site but overall incisions coapted and look good.  Lab Results:  Recent Labs    01/16/19 0310 01/17/19 0424  WBC 9.8 14.2*  HGB 10.7* 11.1*  HCT 32.4* 33.0*  PLT 252 284   BMET Recent Labs    01/16/19 0310 01/17/19 0424  NA 136 136  K 4.2 4.8  CL 104 106  CO2 26 21*  GLUCOSE 323* 380*  BUN 22* 22*  CREATININE 1.58* 1.58*  CALCIUM 8.2* 8.4*   PT/INR Recent Labs    01/15/19 1117  LABPROT 13.3  INR 1.0   ABG No results for input(s): PHART, HCO3 in the last 72 hours.  Invalid input(s): PCO2, PO2  Studies/Results: No results found.  Anti-infectives: Anti-infectives (From admission, onward)   Start     Dose/Rate Route Frequency Ordered Stop   01/17/19 0000  vancomycin (VANCOCIN) IVPB 1000 mg/200 mL premix  Status:  Discontinued     1,000 mg 200 mL/hr over 60 Minutes Intravenous Every 36 hours 01/15/19 1318 01/16/19 0929   01/16/19 1300  vancomycin (VANCOCIN) IVPB 750 mg/150 ml premix     750 mg 150 mL/hr over 60 Minutes Intravenous Every 24 hours 01/16/19 0929     01/15/19 1400  metroNIDAZOLE (FLAGYL) IVPB 500 mg     500 mg 100 mL/hr over 60 Minutes Intravenous Every 8 hours 01/15/19 1143     01/15/19 1400   aztreonam (AZACTAM) 2 g in sodium chloride 0.9 % 100 mL IVPB     2 g 200 mL/hr over 30 Minutes Intravenous Every 8 hours 01/15/19 1318     01/15/19 1145  vancomycin (VANCOCIN) 1,750 mg in sodium chloride 0.9 % 500 mL IVPB     1,750 mg 250 mL/hr over 120 Minutes Intravenous  Once 01/15/19 1138 01/15/19 1535   01/15/19 1145  aztreonam (AZACTAM) 2 g in sodium chloride 0.9 % 100 mL IVPB  Status:  Discontinued     2 g 200 mL/hr over 30 Minutes Intravenous  Once 01/15/19 1140 01/15/19 1318      Assessment/Plan: s/p Procedure(s): AMPUTATION TOE 1ST AND 2ND (Right) Betadine and sterile bandages reapplied to the amputation sites.  Patient should be stable for discharge today.  Resume her antibiotics at home.  We will send in a prescription for hydrocodone.  Follow-up in 1 week.  LOS: 2 days    [name] 01/17/2019

## 2019-01-17 NOTE — Discharge Summary (Signed)
Donna Fisher at [place] NAME: Donna Fisher    MR#:  [id number]  DATE OF BIRTH:  01-18-1966  DATE OF ADMISSION:  01/15/2019   ADMITTING PHYSICIAN: Donna Back, MD  DATE OF DISCHARGE: 01/17/2019 12:45 PM  PRIMARY CARE PHYSICIAN: [facility], Donna Fisher   ADMISSION DIAGNOSIS:   Gangrene of toe  DISCHARGE DIAGNOSIS:   Active Problems:   Type 2 diabetes mellitus with diabetic peripheral angiopathy with gangrene ([place])   Gangrene (Donna Fisher)   SECONDARY DIAGNOSIS:   Past Medical History:  Diagnosis Date  . Anxiety state, unspecified   . Diabetic neuropathy ([place])   . Genital herpes, unspecified   . Headache(784.0)   . Mini stroke (HCC)    Hx  . Other and unspecified hyperlipidemia   . Pain in joint, pelvic region and thigh   . Type II or unspecified type diabetes mellitus without mention of complication, uncontrolled   . Urinary tract infection, site not specified   . Vaginitis and vulvovaginitis, unspecified   . 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  1.  Right foot gangrene of first and second toes-nonhealing ulcerations.  Status post angiogram and balloon angioplasty as outpatient to the right leg last month. -Appreciate podiatry consult -Patient is status post amputation of right foot first and second toes secondary to chronic ulceration with gangrenous changes this admission.  Bulky dressing in place.  Weightbearing as tolerated and patient has a postop boot. -She was treated with IV antibiotics in the hospital, IV vancomycin, Flagyl and aztreonam -Podiatry felt comfortable discharging her on oral antibiotics.  She will be discharged on Cipro and doxycycline  at this time.  An outpatient follow-up visit with podiatry.  2.  Diabetes mellitus with hyperglycemia-sugars were elevated in the hospital because patient was not getting the right dose of her insulin. -A1c is 10.1 -Continue home meds at discharge.  She is on glipizide, Jardiance, Levemir, Victoza.  Dietary counseling given.  Outpatient follow-up needed  3.  CKD stage III-likely secondary to diabetes.  Creatinine seems to be stable.  Monitor for now  4.    Vaginal yeast infection while on antibiotics-prescribed Diflucan  5.    PAD-continue Plavix  6.  Neuropathy-gabapentin    Patient feels comfortable and will be discharged home today  DISCHARGE CONDITIONS:   [place] CONSULTS OBTAINED:   Podiatry consultation by Dr. Caryl Fisher  DRUG ALLERGIES:   Allergies  Allergen Reactions  . Penicillins Swelling    .Has patient had a PCN reaction causing immediate rash, facial/tongue/throat swelling, SOB or lightheadedness with hypotension: No Has patient had a PCN reaction causing severe rash involving mucus membranes or skin necrosis: No Has patient had a PCN reaction that required hospitalization: No Has patient had a PCN reaction occurring within the last 10 years: No If all of the above answers are "NO", then may proceed with Cephalosporin use.   . Codeine Itching  . Ceftriaxone Rash   DISCHARGE MEDICATIONS:   Allergies as of 01/17/2019      Reactions   Penicillins Swelling   .Has patient had a PCN reaction causing immediate rash, facial/tongue/throat swelling, SOB or lightheadedness with hypotension: No Has patient had a PCN reaction causing severe rash involving mucus membranes or skin necrosis: No Has  patient had a PCN reaction that required hospitalization: No Has patient had a PCN reaction occurring within the last 10 years: No If all of the above answers are "NO", then may proceed with Cephalosporin use.   Codeine Itching   Ceftriaxone Rash      Medication List     STOP taking these medications   Santyl ointment Generic drug: collagenase     TAKE these medications   amitriptyline 25 MG tablet Commonly known as: ELAVIL 1/2 pill each bedtime x 1 week, then 1 pill nightly x 1 week, then 1 1/2 pills nightly x 1 week, then 2 pills nightly thereafter. What changed:   how much to take  how to take this  when to take this  reasons to take this  additional instructions Notes to patient: Not given during this hospitalization    aspirin EC 81 MG tablet Take 1 tablet (81 mg total) by mouth daily.   Bayer Microlet Lancets lancets 1 each by Other route as needed for other. Use as instructed   ciprofloxacin 500 MG tablet Commonly known as: Cipro Take 1 tablet (500 mg total) by mouth 2 (two) times daily for 10 days.   clopidogrel 75 MG tablet Commonly known as: Plavix Take 1 tablet (75 mg total) by mouth daily. Notes to patient: Not taken during this hospitalization   CONTOUR NEXT EZ MONITOR w/Device Kit by Does not apply route 3 (three) times daily.   doxycycline 100 MG tablet Commonly known as: ADOXA Take 1 tablet (100 mg total) by mouth 2 (two) times daily for 10 days.   EPINEPHrine 0.3 mg/0.3 mL Soaj injection Commonly known as: EPI-PEN Inject 0.3 mg into the muscle daily as needed for anaphylaxis.   fluconazole 150 MG tablet Commonly known as: DIFLUCAN Take 1 tablet (150 mg total) by mouth every 3 (three) days for 7 days. Start taking on: January 19, 2019   gabapentin 300 MG capsule Commonly known as: NEURONTIN Take 300 mg by mouth at bedtime.   gabapentin 100 MG capsule Commonly known as: NEURONTIN Take 100 mg by mouth daily. In the morning   glipiZIDE 5 MG 24 hr tablet Commonly known as: GLUCOTROL XL Take 5 mg by mouth daily with breakfast. Notes to patient: Not taken during this hospitalization   glucose blood test strip 1 each by Other route as needed for other. Use as instructed   HYDROcodone-acetaminophen 5-325 MG  tablet Commonly known as: Norco Take 1 tablet by mouth every 4 (four) hours as needed for moderate pain.   Jardiance 10 MG Tabs tablet Generic drug: empagliflozin Take 10 mg by mouth daily. Notes to patient: Not taken during this hospitalization   Levemir 100 UNIT/ML injection Generic drug: insulin detemir Inject 50 Units into the skin 2 (two) times a day.   midodrine 10 MG tablet Commonly known as: PROAMATINE Take 1 tablet (10 mg total) by mouth 3 (three) times daily with meals.   pantoprazole 40 MG tablet Commonly known as: PROTONIX Take 40 mg by mouth daily.   Victoza 18 MG/3ML Sopn Generic drug: liraglutide Inject 1.8 mg into the skin at bedtime.        DISCHARGE INSTRUCTIONS:   1.  Podiatry follow-up within 1 week 2.  PCP follow-up in 1 to 2 weeks  DIET:   Diabetic diet  ACTIVITY:   Activity as tolerated  OXYGEN:   Home Oxygen: No.  Oxygen Delivery: room air  DISCHARGE LOCATION:   home   If you experience  worsening of your admission symptoms, develop shortness of breath, life threatening emergency, suicidal or homicidal thoughts you must seek medical attention immediately by calling 911 or calling your MD immediately  if symptoms less severe.  You Must read complete instructions/literature along with all the possible adverse reactions/side effects for all the Medicines you take and that have been prescribed to you. Take any new Medicines after you have completely understood and accpet all the possible adverse reactions/side effects.   Please note  You were cared for by a hospitalist during your hospital stay. If you have any questions about your discharge medications or the care you received while you were in the hospital after you are discharged, you can call the unit and asked to speak with the hospitalist on call if the hospitalist that took care of you is not available. Once you are discharged, your primary care physician will handle any further  medical issues. Please note that NO REFILLS for any discharge medications will be authorized once you are discharged, as it is imperative that you return to your primary care physician (or establish a relationship with a primary care physician if you do not have one) for your aftercare needs so that they can reassess your need for medications and monitor your lab values.    On the day of Discharge:  VITAL SIGNS:   Blood pressure 135/69, pulse 92, temperature 98.3 F (36.8 C), resp. rate 17, height 5' 4.5" (1.638 m), weight 86.6 kg, SpO2 100 %.  PHYSICAL EXAMINATION:    GENERAL:  53 y.o.-year-old patient sitting in the bed with no acute distress.  EYES: Pupils equal, round, reactive to light and accommodation. No scleral icterus. Extraocular muscles intact.  HEENT: Head atraumatic, normocephalic. Oropharynx and nasopharynx clear.  NECK:  Supple, no jugular venous distention. No thyroid enlargement, no tenderness.  LUNGS: Normal breath sounds bilaterally, no wheezing, rales,rhonchi or crepitation. No use of accessory muscles of respiration.  CARDIOVASCULAR: S1, S2 normal. No murmurs, rubs, or gallops.  ABDOMEN: Soft, nontender, nondistended. Bowel sounds present. No organomegaly or mass.  EXTREMITIES: No pedal edema, cyanosis, or clubbing. Right foot in a dressing NEUROLOGIC: Cranial nerves II through XII are intact. Muscle strength 5/5 in all extremities. Sensation intact. Gait not checked.  PSYCHIATRIC: The patient is alert and oriented x 3.  SKIN: No obvious rash, lesion, or ulcer.    DATA REVIEW:   CBC Recent Labs  Lab 01/17/19 0424  WBC 14.2*  HGB 11.1*  HCT 33.0*  PLT 284    Chemistries  Recent Labs  Lab 01/15/19 1117  01/17/19 0424  NA 137   < > 136  K 4.3   < > 4.8  CL 105   < > 106  CO2 23   < > 21*  GLUCOSE 227*   < > 380*  BUN 24*   < > 22*  CREATININE 1.86*   < > 1.58*  CALCIUM 9.2   < > 8.4*  AST 18  --   --   ALT 15  --   --   ALKPHOS 87  --   --    BILITOT 0.6  --   --    < > = values in this interval not displayed.     Microbiology Results  Results for orders placed or performed during the hospital encounter of 01/15/19  SARS Coronavirus 2 (CEPHEID - Performed in [facility] hospital lab), Hosp Order     Status: None   Collection Time: 01/15/19  12:19 PM   Specimen: Nasopharyngeal Swab  Result Value Ref Range Status   SARS Coronavirus 2 NEGATIVE NEGATIVE Final    Comment: (NOTE) If result is NEGATIVE SARS-CoV-2 target nucleic acids are NOT DETECTED. The SARS-CoV-2 RNA is generally detectable in upper and lower  respiratory specimens during the acute phase of infection. The lowest  concentration of SARS-CoV-2 viral copies this assay can detect is 250  copies / mL. A negative result does not preclude SARS-CoV-2 infection  and should not be used as the sole basis for treatment or other  patient management decisions.  A negative result may occur with  improper specimen collection / handling, submission of specimen other  than nasopharyngeal swab, presence of viral mutation(s) within the  areas targeted by this assay, and inadequate number of viral copies  (<250 copies / mL). A negative result must be combined with clinical  observations, patient history, and epidemiological information. If result is POSITIVE SARS-CoV-2 target nucleic acids are DETECTED. The SARS-CoV-2 RNA is generally detectable in upper and lower  respiratory specimens dur ing the acute phase of infection.  Positive  results are indicative of active infection with SARS-CoV-2.  Clinical  correlation with patient history and other diagnostic information is  necessary to determine patient infection status.  Positive results do  not rule out bacterial infection or co-infection with other viruses. If result is PRESUMPTIVE POSTIVE SARS-CoV-2 nucleic acids MAY BE PRESENT.   A presumptive positive result was obtained on the submitted specimen  and confirmed on  repeat testing.  While 2019 novel coronavirus  (SARS-CoV-2) nucleic acids may be present in the submitted sample  additional confirmatory testing may be necessary for epidemiological  and / or clinical management purposes  to differentiate between  SARS-CoV-2 and other Sarbecovirus currently known to infect humans.  If clinically indicated additional testing with an alternate test  methodology [phone]) is advised. The SARS-CoV-2 RNA is generally  detectable in upper and lower respiratory sp ecimens during the acute  phase of infection. The expected result is Negative. Fact Sheet for Patients:  StrictlyIdeas.no Fact Sheet for Healthcare Providers: BankingDealers.co.za This test is not yet approved or cleared by the [place] FDA and has been authorized for detection and/or diagnosis of SARS-CoV-2 by FDA under an Emergency Use Authorization (EUA).  This EUA will remain in effect (meaning this test can be used) for the duration of the COVID-19 declaration under Section 564(b)(1) of the Act, 21 U.S.C. section 360bbb-3(b)(1), unless the authorization is terminated or revoked sooner. Performed at [facility], [address] [place], [place] [address]   Aerobic/Anaerobic Culture (surgical/deep wound)     Status: None (Preliminary result)   Collection Time: 01/16/19 12:46 PM   Specimen: Wound  Result Value Ref Range Status   Specimen Description TISSUE  Final   Special Requests RT SECOND TOE OSTEOMYLETITIS  Final   Gram Stain   Final    FEW WBC PRESENT, PREDOMINANTLY PMN RARE GRAM NEGATIVE RODS RARE GRAM POSITIVE COCCI    Culture   Final    NO GROWTH < 24 HOURS Performed at [facility], [place] [address] [place], [place] [address]    Report Status PENDING  Incomplete    RADIOLOGY:  No results found.   Management plans discussed with the patient, family and they are in agreement.  CODE STATUS:     Code  Status Orders  (From admission, onward)         Start  Ordered   01/15/19 1056  Full code  Continuous     01/15/19 1055        Code Status History    Date Active Date Inactive Code Status Order ID Comments User Context   12/31/2016 1132 01/14/2017 0028 Full Code [id number]  Hillary Bow, MD ED   Advance Care Planning Activity      TOTAL TIME TAKING CARE OF THIS PATIENT: 38 minutes.    Gladstone Lighter M.D on 01/17/2019 at 3:18 PM  Between 7am to 6pm - Pager - [phone]  After 6pm go to [email or web address] - Technical brewer [place] [facility]  Office  [phone]  CC: Primary care physician; Center, [facility]   Note: This dictation was prepared with Dragon dictation along with smaller phrase technology. Any transcriptional errors that result from this process are unintentional.

## 2019-01-18 LAB — SURGICAL PATHOLOGY

## 2019-01-21 ENCOUNTER — Encounter: Payer: Self-pay | Admitting: Internal Medicine

## 2019-01-21 LAB — AEROBIC/ANAEROBIC CULTURE W GRAM STAIN (SURGICAL/DEEP WOUND)

## 2019-02-22 ENCOUNTER — Encounter: Payer: Medicaid Other | Attending: Internal Medicine | Admitting: Dietician

## 2019-02-22 ENCOUNTER — Encounter: Payer: Self-pay | Admitting: Dietician

## 2019-02-22 ENCOUNTER — Other Ambulatory Visit: Payer: Self-pay

## 2019-02-22 VITALS — Ht 64.0 in | Wt 195.7 lb

## 2019-02-22 DIAGNOSIS — E782 Mixed hyperlipidemia: Secondary | ICD-10-CM | POA: Insufficient documentation

## 2019-02-22 DIAGNOSIS — K3184 Gastroparesis: Secondary | ICD-10-CM | POA: Diagnosis not present

## 2019-02-22 DIAGNOSIS — E1143 Type 2 diabetes mellitus with diabetic autonomic (poly)neuropathy: Secondary | ICD-10-CM | POA: Insufficient documentation

## 2019-02-22 DIAGNOSIS — E118 Type 2 diabetes mellitus with unspecified complications: Secondary | ICD-10-CM | POA: Diagnosis not present

## 2019-02-22 DIAGNOSIS — I70211 Atherosclerosis of native arteries of extremities with intermittent claudication, right leg: Secondary | ICD-10-CM | POA: Diagnosis not present

## 2019-02-22 NOTE — Patient Instructions (Addendum)
   Try Fairlife milk (2% or 1%)for a lactose-free option.  Avoid most whole grain and high fiber foods to prevent gastroparesis symptoms, but continue to limit carb amount to 30grams per meal, not more than 45 grams at any one time.   OK to have a low-sugar nutrition drink like Boost Glucose Control or Glucerna, or a protein drink like Premier protein as a breakfast, or along with a breakfast. This will be easier to digest than solid food. Do not follow a total liquid diet though.

## 2019-02-22 NOTE — Progress Notes (Signed)
Medical Nutrition Therapy: Visit start time: 1330  end time: 1430  Assessment:  Diagnosis: Type 2 diabetes, gastroparesis Past medical history: low blood pressure since sepsis several years ago Psychosocial issues/ stress concerns: none  Preferred learning method:  . Auditory . Visual . Hands-on . No preference indicated  Current weight: 195.7lbs with shoes  Height: 5'4" Medications, supplements: reconciled list in medical record  Progress and evaluation:   Patient reports recent improvement in GI symptoms since making some diet changes. She has n/v once in past 2 weeks, and was having symptoms almost daily. Often during the night.    Stopped eating corn, popcorn, berries, grapes, tomatoes; avoids deep fried foods. She is usually able to eat only small portions of food at any one time.   Patient would like to lose weight, but has had difficulty despite making diet changes and limiting portions.   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   Dietary Intake:  Usual eating pattern includes 3 meals and 0-1 snacks per day( 3-4x a week). Dining out frequency: 4-6 meals per week.  Breakfast: peanut butter sandwich or oatmeal with fruit; occ cereal with minimal milk (lactose intolerance); fried bologna sandwich or sausage Snack: none Lunch: pb sandwich or leftovers; soup ie tomato with grilled cheese Snack: sometimes fruit-- peach, banana, 1/2 grapefruit; or pb crackers Supper: burger or hot dog and fries; sub sandwich; pot roast with potatoes, celery, carrots, onion flavor; chicken; chicken salad sandwich; avoids deep fried foods; less red meat recently Snack: none or same as pm Beverages: water, some with crystal light or lemon + sweet n low packets, occasional regular Dr. Malachi Bonds does not like diet soda. No coffee or tea  Nutrition Care Education: Topics covered: Diabetes, gastroparesis Basic  nutrition: basic food groups, appropriate nutrient balance Weight control: factors affecting weight and ability to lose weight; encouraged focusing efforts on controlling GI symptoms as well as BGs first, then adjusting eating pattern as needed and physical activity as tolerated to promote weight loss.  Diabetes: appropriate carb intake and balance, appropriate carb choices, importance of protein sources with meals and adequate daily intake Gastroparesis: Avoiding high fiber foods, eating small meals and snacks at regular intervals, avoiding high fat choices, options for liquid meals or snacks, chewing foods thoroughly and eating slowly, avoiding lying down after eating.   Nutritional Diagnosis:  [place]-2.2 Altered nutrition-related laboratory As related to Type 2 diabetes.  As evidenced by patient with recent HbA1C of 10.1%. [place]-1.4 Altered GI function As related to gastroparesis.  As evidenced by patient and MD reported symptoms of GI distress.  Intervention:   Instruction and discussion as noted above.  Patient has been making some positive diet changes to reduce fat and fiber intake. She reports some improvement in GI symptoms.   Discussed need to allow some flexibility with food choices for diabetes to avoid GI distress.   Established nutrition goals with direction from patient.  No RD follow-up scheduled at this time; patient will schedule later if needed.  Education Materials given:  . Plate Planner with food lists . Gastroparesis Nutrition Therapy (NCM) . Goals/ instructions   Learner/ who was taught:  . Patient   Level of understanding: Marland Kitchen Verbalizes/ demonstrates competency   Demonstrated degree of understanding via:   Teach back Learning barriers: . None  Willingness to learn/ readiness for change: . Eager, change in progress   Monitoring and Evaluation:  Dietary intake, exercise, BG control, GI symptoms, and body weight      follow up: prn

## 2019-03-19 ENCOUNTER — Encounter (INDEPENDENT_AMBULATORY_CARE_PROVIDER_SITE_OTHER): Payer: Self-pay | Admitting: Vascular Surgery

## 2019-03-19 ENCOUNTER — Ambulatory Visit (INDEPENDENT_AMBULATORY_CARE_PROVIDER_SITE_OTHER): Payer: Medicaid Other

## 2019-03-19 ENCOUNTER — Other Ambulatory Visit: Payer: Self-pay

## 2019-03-19 ENCOUNTER — Ambulatory Visit (INDEPENDENT_AMBULATORY_CARE_PROVIDER_SITE_OTHER): Payer: Medicaid Other | Admitting: Vascular Surgery

## 2019-03-19 VITALS — BP 92/58 | HR 97 | Resp 16 | Ht 65.0 in | Wt 198.8 lb

## 2019-03-19 DIAGNOSIS — E118 Type 2 diabetes mellitus with unspecified complications: Secondary | ICD-10-CM

## 2019-03-19 DIAGNOSIS — E782 Mixed hyperlipidemia: Secondary | ICD-10-CM

## 2019-03-19 DIAGNOSIS — E1143 Type 2 diabetes mellitus with diabetic autonomic (poly)neuropathy: Secondary | ICD-10-CM | POA: Diagnosis not present

## 2019-03-19 DIAGNOSIS — I70235 Atherosclerosis of native arteries of right leg with ulceration of other part of foot: Secondary | ICD-10-CM | POA: Diagnosis not present

## 2019-03-19 DIAGNOSIS — I70211 Atherosclerosis of native arteries of extremities with intermittent claudication, right leg: Secondary | ICD-10-CM | POA: Diagnosis not present

## 2019-03-19 DIAGNOSIS — K3184 Gastroparesis: Secondary | ICD-10-CM
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
## 2019-05-17 ENCOUNTER — Other Ambulatory Visit: Payer: Medicaid Other

## 2019-05-24 ENCOUNTER — Telehealth: Payer: Self-pay | Admitting: Internal Medicine

## 2019-05-24 ENCOUNTER — Inpatient Hospital Stay: Admission: AD | Admit: 2019-05-24 | Payer: Medicare Other | Source: Ambulatory Visit | Admitting: Internal Medicine

## 2019-05-24 NOTE — Telephone Encounter (Signed)
[facility] TELEPHONE ENCOUNTER NOTE  Patient: Donna Fisher [id number]   PCP: Patient, No Pcp Per DOB: Sep 03, 1965   DOS: 05/24/2019     Received a call from Dr. Caryl Comes from [facility], podiatry office.  Past medical history of peripheral vascular disease and active smoker.  Presents with cellulitis and possible gangrene of the right leg.  Patient will require IV antibiotics and treatment.  Dr. Luana Shu from podiatry will follow up on the patient.  Author:  Berle Mull, MD [facility] 05/24/2019  If 7PM-7AM, please contact night-coverage To reach On-call, see [email or web address]

## 2019-05-27 ENCOUNTER — Other Ambulatory Visit: Payer: Self-pay

## 2019-05-27 ENCOUNTER — Inpatient Hospital Stay
Admission: EM | Admit: 2019-05-27 | Discharge: 2019-05-29 | DRG: 253 | Disposition: A | Discharge status: Home / Self Care | Payer: Medicare Other | Attending: Internal Medicine | Admitting: Internal Medicine

## 2019-05-27 ENCOUNTER — Emergency Department: Payer: Medicare Other

## 2019-05-27 ENCOUNTER — Encounter: Payer: Self-pay | Admitting: Emergency Medicine

## 2019-05-27 DIAGNOSIS — E785 Hyperlipidemia, unspecified: Secondary | ICD-10-CM | POA: Diagnosis present

## 2019-05-27 DIAGNOSIS — Z803 Family history of malignant neoplasm of breast: Secondary | ICD-10-CM | POA: Diagnosis not present

## 2019-05-27 DIAGNOSIS — Z794 Long term (current) use of insulin: Secondary | ICD-10-CM

## 2019-05-27 DIAGNOSIS — I70262 Atherosclerosis of native arteries of extremities with gangrene, left leg: Secondary | ICD-10-CM | POA: Diagnosis present

## 2019-05-27 DIAGNOSIS — E1143 Type 2 diabetes mellitus with diabetic autonomic (poly)neuropathy: Secondary | ICD-10-CM | POA: Diagnosis present

## 2019-05-27 DIAGNOSIS — M869 Osteomyelitis, unspecified: Secondary | ICD-10-CM | POA: Diagnosis present

## 2019-05-27 DIAGNOSIS — K3184 Gastroparesis: Secondary | ICD-10-CM | POA: Diagnosis present

## 2019-05-27 DIAGNOSIS — Z8249 Family history of ischemic heart disease and other diseases of the circulatory system: Secondary | ICD-10-CM | POA: Diagnosis not present

## 2019-05-27 DIAGNOSIS — Z89421 Acquired absence of other right toe(s): Secondary | ICD-10-CM | POA: Diagnosis not present

## 2019-05-27 DIAGNOSIS — L97509 Non-pressure chronic ulcer of other part of unspecified foot with unspecified severity: Secondary | ICD-10-CM

## 2019-05-27 DIAGNOSIS — F17210 Nicotine dependence, cigarettes, uncomplicated: Secondary | ICD-10-CM | POA: Diagnosis present

## 2019-05-27 DIAGNOSIS — F419 Anxiety disorder, unspecified: Secondary | ICD-10-CM

## 2019-05-27 DIAGNOSIS — E1122 Type 2 diabetes mellitus with diabetic chronic kidney disease: Secondary | ICD-10-CM | POA: Diagnosis present

## 2019-05-27 DIAGNOSIS — I739 Peripheral vascular disease, unspecified: Secondary | ICD-10-CM | POA: Diagnosis present

## 2019-05-27 DIAGNOSIS — E1169 Type 2 diabetes mellitus with other specified complication: Secondary | ICD-10-CM | POA: Diagnosis present

## 2019-05-27 DIAGNOSIS — Z8679 Personal history of other diseases of the circulatory system: Secondary | ICD-10-CM

## 2019-05-27 DIAGNOSIS — Z885 Allergy status to narcotic agent status: Secondary | ICD-10-CM | POA: Diagnosis not present

## 2019-05-27 DIAGNOSIS — Z20828 Contact with and (suspected) exposure to other viral communicable diseases: Secondary | ICD-10-CM | POA: Diagnosis present

## 2019-05-27 DIAGNOSIS — E1152 Type 2 diabetes mellitus with diabetic peripheral angiopathy with gangrene: Secondary | ICD-10-CM | POA: Diagnosis present

## 2019-05-27 DIAGNOSIS — N1832 Chronic kidney disease, stage 3b: Secondary | ICD-10-CM | POA: Diagnosis present

## 2019-05-27 DIAGNOSIS — Z880 Allergy status to penicillin: Secondary | ICD-10-CM

## 2019-05-27 DIAGNOSIS — Z833 Family history of diabetes mellitus: Secondary | ICD-10-CM | POA: Diagnosis not present

## 2019-05-27 DIAGNOSIS — Z89411 Acquired absence of right great toe: Secondary | ICD-10-CM | POA: Diagnosis not present

## 2019-05-27 DIAGNOSIS — I959 Hypotension, unspecified: Secondary | ICD-10-CM | POA: Diagnosis present

## 2019-05-27 DIAGNOSIS — Z79899 Other long term (current) drug therapy: Secondary | ICD-10-CM

## 2019-05-27 DIAGNOSIS — E118 Type 2 diabetes mellitus with unspecified complications: Secondary | ICD-10-CM | POA: Diagnosis present

## 2019-05-27 DIAGNOSIS — E11621 Type 2 diabetes mellitus with foot ulcer: Secondary | ICD-10-CM

## 2019-05-27 DIAGNOSIS — N179 Acute kidney failure, unspecified: Secondary | ICD-10-CM | POA: Diagnosis present

## 2019-05-27 DIAGNOSIS — Z8673 Personal history of transient ischemic attack (TIA), and cerebral infarction without residual deficits: Secondary | ICD-10-CM

## 2019-05-27 DIAGNOSIS — Z7982 Long term (current) use of aspirin: Secondary | ICD-10-CM

## 2019-05-27 LAB — CBC WITH DIFFERENTIAL/PLATELET
Abs Immature Granulocytes: 0.05 10*3/uL (ref 0.00–0.07)
Basophils Absolute: 0.1 10*3/uL (ref 0.0–0.1)
Basophils Relative: 1 %
Eosinophils Absolute: 0.2 10*3/uL (ref 0.0–0.5)
Eosinophils Relative: 2 %
HCT: 40.3 % (ref 36.0–46.0)
Hemoglobin: 13.5 g/dL (ref 12.0–15.0)
Immature Granulocytes: 1 %
Lymphocytes Relative: 18 %
Lymphs Abs: 1.9 10*3/uL (ref 0.7–4.0)
MCH: 31.7 pg (ref 26.0–34.0)
MCHC: 33.5 g/dL (ref 30.0–36.0)
MCV: 94.6 fL (ref 80.0–100.0)
Monocytes Absolute: 0.8 10*3/uL (ref 0.1–1.0)
Monocytes Relative: 7 %
Neutro Abs: 7.8 10*3/uL — ABNORMAL HIGH (ref 1.7–7.7)
Neutrophils Relative %: 71 %
Platelets: 360 10*3/uL (ref 150–400)
RBC: 4.26 MIL/uL (ref 3.87–5.11)
RDW: 12.4 % (ref 11.5–15.5)
WBC: 10.8 10*3/uL — ABNORMAL HIGH (ref 4.0–10.5)
nRBC: 0 % (ref 0.0–0.2)

## 2019-05-27 LAB — BASIC METABOLIC PANEL
Anion gap: 11 (ref 5–15)
BUN: 24 mg/dL — ABNORMAL HIGH (ref 6–20)
CO2: 25 mmol/L (ref 22–32)
Calcium: 9.3 mg/dL (ref 8.9–10.3)
Chloride: 98 mmol/L (ref 98–111)
Creatinine, Ser: 1.73 mg/dL — ABNORMAL HIGH (ref 0.44–1.00)
GFR calc Af Amer: 38 mL/min — ABNORMAL LOW (ref >60)
GFR calc non Af Amer: 33 mL/min — ABNORMAL LOW (ref >60)
Glucose, Bld: 397 mg/dL — ABNORMAL HIGH (ref 70–99)
Potassium: 4.9 mmol/L (ref 3.5–5.1)
Sodium: 134 mmol/L — ABNORMAL LOW (ref 135–145)

## 2019-05-27 LAB — GLUCOSE, CAPILLARY: Glucose-Capillary: 341 mg/dL — ABNORMAL HIGH (ref 70–99)

## 2019-05-27 MED ORDER — VANCOMYCIN HCL IN DEXTROSE 1-5 GM/200ML-% IV SOLN
1000.0000 mg | Freq: Once | INTRAVENOUS | Status: AC
  Administered 2019-05-27: 1000 mg via INTRAVENOUS
  Filled 2019-05-27: qty 200

## 2019-05-27 MED ORDER — VANCOMYCIN HCL 500 MG IV SOLR
500.0000 mg | Freq: Once | INTRAVENOUS | Status: AC
  Administered 2019-05-28: 500 mg via INTRAVENOUS
  Filled 2019-05-27 (×2): qty 500

## 2019-05-27 MED ORDER — HYDROCODONE-ACETAMINOPHEN 5-325 MG PO TABS
1.0000 | ORAL_TABLET | ORAL | Status: DC | PRN
  Administered 2019-05-28 (×2): 1 via ORAL
  Filled 2019-05-27 (×2): qty 1

## 2019-05-27 MED ORDER — ACETAMINOPHEN 325 MG PO TABS
650.0000 mg | ORAL_TABLET | Freq: Four times a day (QID) | ORAL | Status: DC | PRN
  Administered 2019-05-29: 650 mg via ORAL
  Filled 2019-05-27: qty 2

## 2019-05-27 MED ORDER — FENTANYL CITRATE (PF) 100 MCG/2ML IJ SOLN
50.0000 ug | Freq: Once | INTRAMUSCULAR | Status: AC
  Administered 2019-05-27: 50 ug via INTRAVENOUS
  Filled 2019-05-27: qty 2

## 2019-05-27 NOTE — ED Notes (Signed)
Pt without c/o pain, able to walk to toilet by self. This RN wrapped left great toe with vaseline gauze and kurlex.

## 2019-05-27 NOTE — ED Triage Notes (Addendum)
Pt to ED via POV for left great toe infection. Pt states that she had an appt with Dr. Cleda Mccreedy on Friday, he wanted to admit her to amputate her toe because she has a non healing wound, pt states that he wanted to get her in with vascular prior to amputating but has not been able to do so. Pt states that Dr. Cleda Mccreedy told her to come to the ED today so that she could be admitted. Pt is in NAD at this time.   Pt states that wound has been there for about 3 weeks. Pt has large area of what appears to be an open wound and black tissue on both sides of her toe.

## 2019-05-27 NOTE — ED Notes (Signed)
EDP in room  

## 2019-05-27 NOTE — ED Notes (Signed)
Pt states she had a blister on tip of left big toe, and it progressed. Pt with necrotic areas on inside and outside of left great toe, open raw areas on tip and around nail bed. Pt states Dr. Caryl Comes is planning to amputate left great toe and told her to come to ED to be admitted.
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
## 2019-05-28 ENCOUNTER — Other Ambulatory Visit: Payer: Self-pay

## 2019-05-28 ENCOUNTER — Encounter
Admission: EM | Disposition: A | Discharge status: Home / Self Care | Payer: Self-pay | Source: Home / Self Care | Attending: Internal Medicine

## 2019-05-28 ENCOUNTER — Other Ambulatory Visit: Admission: RE | Admit: 2019-05-28 | Payer: Medicare Other | Source: Ambulatory Visit

## 2019-05-28 ENCOUNTER — Other Ambulatory Visit (INDEPENDENT_AMBULATORY_CARE_PROVIDER_SITE_OTHER): Payer: Self-pay | Admitting: Vascular Surgery

## 2019-05-28 DIAGNOSIS — Z8679 Personal history of other diseases of the circulatory system: Secondary | ICD-10-CM

## 2019-05-28 DIAGNOSIS — I70262 Atherosclerosis of native arteries of extremities with gangrene, left leg: Secondary | ICD-10-CM

## 2019-05-28 HISTORY — PX: LOWER EXTREMITY ANGIOGRAPHY: CATH118251

## 2019-05-28 LAB — CBC
HCT: 33.8 % — ABNORMAL LOW (ref 36.0–46.0)
Hemoglobin: 11.4 g/dL — ABNORMAL LOW (ref 12.0–15.0)
MCH: 32.2 pg (ref 26.0–34.0)
MCHC: 33.7 g/dL (ref 30.0–36.0)
MCV: 95.5 fL (ref 80.0–100.0)
Platelets: 302 10*3/uL (ref 150–400)
RBC: 3.54 MIL/uL — ABNORMAL LOW (ref 3.87–5.11)
RDW: 12.3 % (ref 11.5–15.5)
WBC: 11.3 10*3/uL — ABNORMAL HIGH (ref 4.0–10.5)
nRBC: 0 % (ref 0.0–0.2)

## 2019-05-28 LAB — GLUCOSE, CAPILLARY
Glucose-Capillary: 148 mg/dL — ABNORMAL HIGH (ref 70–99)
Glucose-Capillary: 170 mg/dL — ABNORMAL HIGH (ref 70–99)
Glucose-Capillary: 171 mg/dL — ABNORMAL HIGH (ref 70–99)
Glucose-Capillary: 230 mg/dL — ABNORMAL HIGH (ref 70–99)
Glucose-Capillary: 359 mg/dL — ABNORMAL HIGH (ref 70–99)

## 2019-05-28 LAB — BASIC METABOLIC PANEL
Anion gap: 12 (ref 5–15)
BUN: 24 mg/dL — ABNORMAL HIGH (ref 6–20)
CO2: 26 mmol/L (ref 22–32)
Calcium: 8.9 mg/dL (ref 8.9–10.3)
Chloride: 99 mmol/L (ref 98–111)
Creatinine, Ser: 1.68 mg/dL — ABNORMAL HIGH (ref 0.44–1.00)
GFR calc Af Amer: 40 mL/min — ABNORMAL LOW (ref >60)
GFR calc non Af Amer: 34 mL/min — ABNORMAL LOW (ref >60)
Glucose, Bld: 320 mg/dL — ABNORMAL HIGH (ref 70–99)
Potassium: 4.3 mmol/L (ref 3.5–5.1)
Sodium: 137 mmol/L (ref 135–145)

## 2019-05-28 LAB — SARS CORONAVIRUS 2 (TAT 6-24 HRS): SARS Coronavirus 2: NEGATIVE

## 2019-05-28 LAB — HEMOGLOBIN A1C
Hgb A1c MFr Bld: 9.9 % — ABNORMAL HIGH (ref 4.8–5.6)
Mean Plasma Glucose: 237.43 mg/dL

## 2019-05-28 SURGERY — LOWER EXTREMITY ANGIOGRAPHY
Anesthesia: Moderate Sedation | Laterality: Left

## 2019-05-28 MED ORDER — CLINDAMYCIN PHOSPHATE 300 MG/50ML IV SOLN
INTRAVENOUS | Status: AC
  Filled 2019-05-28: qty 50

## 2019-05-28 MED ORDER — HEPARIN SODIUM (PORCINE) 1000 UNIT/ML IJ SOLN
INTRAMUSCULAR | Status: DC | PRN
  Administered 2019-05-28: 4000 [IU] via INTRAVENOUS

## 2019-05-28 MED ORDER — METHYLPREDNISOLONE SODIUM SUCC 125 MG IJ SOLR: 125.0000 mg | Freq: Once | INTRAMUSCULAR | Status: DC | PRN

## 2019-05-28 MED ORDER — FENTANYL CITRATE (PF) 100 MCG/2ML IJ SOLN
INTRAMUSCULAR | Status: DC | PRN
  Administered 2019-05-28: 50 ug via INTRAVENOUS

## 2019-05-28 MED ORDER — ASPIRIN EC 81 MG PO TBEC
81.0000 mg | DELAYED_RELEASE_TABLET | Freq: Every day | ORAL | Status: DC
  Administered 2019-05-28: 10:00:00 81 mg via ORAL
  Filled 2019-05-28 (×2): qty 1

## 2019-05-28 MED ORDER — MIDAZOLAM HCL 2 MG/ML PO SYRP: 8.0000 mg | ORAL_SOLUTION | Freq: Once | ORAL | Status: DC | PRN

## 2019-05-28 MED ORDER — GABAPENTIN 300 MG PO CAPS
300.0000 mg | ORAL_CAPSULE | Freq: Two times a day (BID) | ORAL | Status: DC
  Administered 2019-05-28 (×3): 300 mg via ORAL
  Filled 2019-05-28 (×5): qty 1

## 2019-05-28 MED ORDER — INSULIN ASPART 100 UNIT/ML ~~LOC~~ SOLN: 0.0000 [IU] | Freq: Three times a day (TID) | SUBCUTANEOUS | Status: DC

## 2019-05-28 MED ORDER — CIPROFLOXACIN IN D5W 400 MG/200ML IV SOLN
400.0000 mg | Freq: Two times a day (BID) | INTRAVENOUS | Status: DC
  Administered 2019-05-28 – 2019-05-29 (×2): 400 mg via INTRAVENOUS
  Filled 2019-05-28 (×7): qty 200

## 2019-05-28 MED ORDER — SODIUM CHLORIDE 0.9 % IV SOLN: INTRAVENOUS | Status: DC

## 2019-05-28 MED ORDER — SODIUM CHLORIDE 0.9 % IV SOLN
INTRAVENOUS | Status: DC
  Administered 2019-05-28 – 2019-05-29 (×3): via INTRAVENOUS

## 2019-05-28 MED ORDER — PANTOPRAZOLE SODIUM 40 MG PO TBEC
40.0000 mg | DELAYED_RELEASE_TABLET | Freq: Every day | ORAL | Status: DC
  Administered 2019-05-28 – 2019-05-29 (×2): 40 mg via ORAL
  Filled 2019-05-28 (×2): qty 1

## 2019-05-28 MED ORDER — ONDANSETRON HCL 4 MG/2ML IJ SOLN: 4.0000 mg | Freq: Four times a day (QID) | INTRAMUSCULAR | Status: DC | PRN

## 2019-05-28 MED ORDER — CHLORHEXIDINE GLUCONATE CLOTH 2 % EX PADS
6.0000 | MEDICATED_PAD | Freq: Every day | CUTANEOUS | Status: DC
  Administered 2019-05-29 (×2): 6 via TOPICAL

## 2019-05-28 MED ORDER — MIDODRINE HCL 5 MG PO TABS
10.0000 mg | ORAL_TABLET | Freq: Three times a day (TID) | ORAL | Status: DC
  Administered 2019-05-28 – 2019-05-29 (×3): 10 mg via ORAL
  Filled 2019-05-28 (×7): qty 2

## 2019-05-28 MED ORDER — IODIXANOL 320 MG/ML IV SOLN
INTRAVENOUS | Status: DC | PRN
  Administered 2019-05-28: 80 mL via INTRA_ARTERIAL

## 2019-05-28 MED ORDER — DIPHENHYDRAMINE HCL 50 MG/ML IJ SOLN: 50.0000 mg | Freq: Once | INTRAMUSCULAR | Status: DC | PRN

## 2019-05-28 MED ORDER — INSULIN DETEMIR 100 UNIT/ML ~~LOC~~ SOLN
40.0000 [IU] | Freq: Two times a day (BID) | SUBCUTANEOUS | Status: DC
  Administered 2019-05-28 (×3): 40 [IU] via SUBCUTANEOUS
  Filled 2019-05-28 (×7): qty 0.4

## 2019-05-28 MED ORDER — SODIUM CHLORIDE 0.9 % IV SOLN
INTRAVENOUS | Status: DC
  Administered 2019-05-28: 14:00:00 via INTRAVENOUS

## 2019-05-28 MED ORDER — FAMOTIDINE 20 MG PO TABS: 40.0000 mg | ORAL_TABLET | Freq: Once | ORAL | Status: DC | PRN

## 2019-05-28 MED ORDER — PNEUMOCOCCAL VAC POLYVALENT 25 MCG/0.5ML IJ INJ: 0.5000 mL | INJECTION | INTRAMUSCULAR | Status: DC

## 2019-05-28 MED ORDER — FENTANYL CITRATE (PF) 100 MCG/2ML IJ SOLN
INTRAMUSCULAR | Status: AC
  Filled 2019-05-28: qty 2

## 2019-05-28 MED ORDER — HYDROXYZINE HCL 25 MG PO TABS
25.0000 mg | ORAL_TABLET | Freq: Two times a day (BID) | ORAL | Status: DC
  Administered 2019-05-28 (×3): 25 mg via ORAL
  Filled 2019-05-28 (×5): qty 1

## 2019-05-28 MED ORDER — INSULIN ASPART 100 UNIT/ML ~~LOC~~ SOLN
0.0000 [IU] | Freq: Four times a day (QID) | SUBCUTANEOUS | Status: DC
  Administered 2019-05-28: 15 [IU] via SUBCUTANEOUS
  Administered 2019-05-28: 3 [IU] via SUBCUTANEOUS
  Administered 2019-05-29: 5 [IU] via SUBCUTANEOUS
  Filled 2019-05-28 (×3): qty 1

## 2019-05-28 MED ORDER — MIDAZOLAM HCL 2 MG/2ML IJ SOLN
INTRAMUSCULAR | Status: DC | PRN
  Administered 2019-05-28: 2 mg via INTRAVENOUS

## 2019-05-28 MED ORDER — VANCOMYCIN HCL IN DEXTROSE 1-5 GM/200ML-% IV SOLN
1000.0000 mg | INTRAVENOUS | Status: DC
  Administered 2019-05-29: 11:00:00 1000 mg via INTRAVENOUS
  Filled 2019-05-28 (×2): qty 200

## 2019-05-28 MED ORDER — HYDROMORPHONE HCL 1 MG/ML IJ SOLN: 1.0000 mg | Freq: Once | INTRAMUSCULAR | Status: DC | PRN

## 2019-05-28 MED ORDER — HEPARIN SODIUM (PORCINE) 1000 UNIT/ML IJ SOLN
INTRAMUSCULAR | Status: AC
  Filled 2019-05-28: qty 1

## 2019-05-28 MED ORDER — CLINDAMYCIN PHOSPHATE 300 MG/50ML IV SOLN
300.0000 mg | Freq: Once | INTRAVENOUS | Status: AC
  Administered 2019-05-28: 300 mg via INTRAVENOUS
  Filled 2019-05-28: qty 50

## 2019-05-28 MED ORDER — MIDAZOLAM HCL 2 MG/2ML IJ SOLN
INTRAMUSCULAR | Status: AC
  Filled 2019-05-28: qty 2

## 2019-05-28 SURGICAL SUPPLY — 11 items
BALLN LUTONIX DCB 5X80X130 (BALLOONS) ×3
BALLOON LUTONIX DCB 5X80X130 (BALLOONS) ×1 IMPLANT
CATH PIG 70CM (CATHETERS) ×3 IMPLANT
DEVICE PRESTO INFLATION (MISCELLANEOUS) ×3 IMPLANT
DEVICE STARCLOSE SE CLOSURE (Vascular Products) ×3 IMPLANT
GLIDEWIRE ADV .035X260CM (WIRE) ×3 IMPLANT
PACK ANGIOGRAPHY (CUSTOM PROCEDURE TRAY) ×3 IMPLANT
SHEATH ANL2 6FRX45 HC (SHEATH) ×3 IMPLANT
SHEATH BRITE TIP 5FRX11 (SHEATH) ×3 IMPLANT
STENT LIFESTENT 5F 6X80X135 (Permanent Stent) ×3 IMPLANT
WIRE J 3MM .035X145CM (WIRE) ×3 IMPLANT

## 2019-05-28 NOTE — Progress Notes (Addendum)
Pharmacy Antibiotic Note  Donna Fisher is a 53 y.o. female admitted on 05/27/2019 with possible osteomyelitis.  Pharmacy has been consulted for vanc/ciprofloxacin dosing.  Plan: Patient received a total of vanc 1.5g IV load  Vancomycin 1000 mg IV Q 36 hrs. Goal AUC 400-550. Expected AUC: 518.5 SCr used: 1.68 Cssmin: 12.4  Will continue cipro 400 mg IV q12h, patient currently appears to be in AKI unsure if she has a h/o CKD, no documentation, will continue to monitor renal function w/ am labs and adjust as needed.  Height: 5\' 2"  (157.5 cm) Weight: 190 lb (86.2 kg) IBW/kg (Calculated) : 50.1  Temp (24hrs), Avg:98.2 F (36.8 C), Min:98.2 F (36.8 C), Max:98.2 F (36.8 C)  Recent Labs  Lab 05/27/19 1328 05/28/19 0201  WBC 10.8* 11.3*  CREATININE 1.73* 1.68*    Estimated Creatinine Clearance: 39.4 mL/min (A) (by C-G formula based on SCr of 1.68 mg/dL (H)).    Allergies  Allergen Reactions  . Penicillins Swelling    .Has patient had a PCN reaction causing immediate rash, facial/tongue/throat swelling, SOB or lightheadedness with hypotension: No Has patient had a PCN reaction causing severe rash involving mucus membranes or skin necrosis: No Has patient had a PCN reaction that required hospitalization: No Has patient had a PCN reaction occurring within the last 10 years: No If all of the above answers are "NO", then may proceed with Cephalosporin use.   . Codeine Itching  . Ceftriaxone Rash    Thank you for allowing pharmacy to be a part of this patient's care.  Tobie Lords, PharmD, BCPS Clinical Pharmacist 05/28/2019 4:28 AM

## 2019-05-28 NOTE — Progress Notes (Signed)
Dr. Lucky Cowboy at bedside, speaking with pt. Re: procedural results. Report called to floor RN now.

## 2019-05-28 NOTE — ED Notes (Signed)
Pt CBG 230, Greg,RN made aware. Pt made aware that she is NPO at this time. Drinks thrown away in the trash.

## 2019-05-28 NOTE — Progress Notes (Signed)
Linus Salmons, PA at bedside, examining pt. Left great toe: ulcerative, "blister-popped" per pt. Drsg. To left great toe removed by PA, examined, then redressed with 2x2's and paper tape. Pt. Tolerated well. Noted also pt. Missing Right great and 2nd toe(s).

## 2019-05-28 NOTE — ED Notes (Signed)
Admission Provider at bedside. 
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
## 2019-05-28 NOTE — Progress Notes (Addendum)
PROGRESS NOTE  Donna Fisher  DOB: 1966/03/27  PCP: Patient, No Pcp Per [id number]  DOA: 05/27/2019  LOS: 1 day   Brief narrative: Donna Fisher is a 53 y.o. female with PMH of DM2, HLD, hypOtension, PAD, diabetic neuropathy, gastroparesis, history of diabetic foot with toe amputations. Patient presented to the ED on 05/27/2019 at the request of her podiatrist Dr. Cleda Mccreedy for planned amputation of her left great toe.  Patient first started to notice a blister on her left great toe about 3 weeks ago with progressively worsening.  11/5, saw podiatrist Dr. Caryl Comes in the office, 2 appeared gangrenous.  Inpatient admission as well as vascular surgery evaluation for angiography was planned.  However, hospital bed was not available for direct admission. Patient was discharged to home on oral doxycycline and ciprofloxacin.  She was asked to present to the ED on 11/8 for admission. In the ED, patient was afebrile, tachycardic to 105, normotensive, breathing comfortably on room air. Work-up showed WBC count of 10.8, sodium 134, BUN/creatinine 24/1.73. Left great toe X-ray showed increased subtle bony erosion of the lateral tuft of the great toe distal phalanx concerning for osteomyelitis.  She was admitted under hospitalist service for further evaluation and management.  Subjective: Patient was seen and examined this morning.  Pleasant middle-aged Caucasian female.  Lying down in bed.  Not in distress.  No new symptoms.  Pain controlled.  I discussed with podiatrist Dr. Caryl Comes and vascular surgery as well.  Patient will get lower extremity angiogram today.  I made her n.p.o.  Assessment/Plan: Left great toe osteomyelitis  -consulted Podiatry for planned amputation  -Currently on IV vancomycin and IV Cipro -last vascular ABI in August/2020 showed left lower extremity with no evidence of arterial disease  -continue IV vancomycin and IV ciprofloxacin. -PRN Hydrocodone q4hr for pain control.  Type  2 diabetes with neuropathy, gastroparesis -normally on 50units BID of Levermir at home -Currently on 40 units BID -Since patient is n.p.o., start on sliding scale 7 every 6 hours.  Postprocedure, will switch to [facility] at bedtime. -continue gabapentin.  Continue PPI -Pending HbA1C   AKI on CKD 3b -Creatinine at baseline 1.58 from July 2020.  Presented with creatinine of 1.73, 1.68 today. -Since patient will get contrast with angiogram.  We will start the patient on normal saline at 100/h at this time.  Hypotension -continue home midodrine  Anxiety -continue daily Atarax  Mobility: Needs PT evaluation postprocedure DVT prophylaxis:  SCDs Code Status:   Code Status: Full Code  Family Communication:  Expected Discharge:  Hopefully home post to amputation  Consultants:  Podiatry, vascular surgery  Procedures:  11/9, plan for lower extremity angiogram  Antimicrobials: Anti-infectives (From admission, onward)   Start     Dose/Rate Route Frequency Ordered Stop   05/29/19 0800  vancomycin (VANCOCIN) IVPB 1000 mg/200 mL premix     1,000 mg 200 mL/hr over 60 Minutes Intravenous Every 36 hours 05/28/19 0427     05/28/19 0145  ciprofloxacin (CIPRO) IVPB 400 mg     400 mg 200 mL/hr over 60 Minutes Intravenous Every 12 hours 05/28/19 0134     05/28/19 0000  vancomycin (VANCOCIN) 500 mg in sodium chloride 0.9 % 100 mL IVPB     500 mg 100 mL/hr over 60 Minutes Intravenous  Once 05/27/19 2358 05/28/19 0300   05/27/19 1815  vancomycin (VANCOCIN) IVPB 1000 mg/200 mL premix     1,000 mg 200 mL/hr over 60 Minutes Intravenous  Once 05/27/19  1807 05/27/19 2030      Diet Order            Diet NPO time specified  Diet effective now              Infusions:  . ciprofloxacin Stopped (05/28/19 0336)  . [START ON 05/29/2019] vancomycin      Scheduled Meds: . aspirin EC  81 mg Oral Daily  . gabapentin  300 mg Oral BID  . hydrOXYzine  25 mg Oral BID  . insulin aspart  0-15 Units  Subcutaneous Q6H  . insulin detemir  40 Units Subcutaneous BID  . midodrine  10 mg Oral TID WC  . pantoprazole  40 mg Oral Daily    PRN meds: acetaminophen, HYDROcodone-acetaminophen   Objective: Vitals:   05/28/19 0100 05/28/19 0428  BP: 131/85 (!) 89/69  Pulse: 99 95  Resp: 13 18  Temp:    SpO2: 97% 93%    Intake/Output Summary (Last 24 hours) at 05/28/2019 1004 Last data filed at 05/27/2019 2030 Gross per 24 hour  Intake 200 ml  Output -  Net 200 ml   Filed Weights   05/27/19 1325  Weight: 86.2 kg   Weight change:  Body mass index is 34.75 kg/m.   Physical Exam: General exam: Appears calm and comfortable.  Skin: No rashes, lesions or ulcers. HEENT: Atraumatic, normocephalic, supple neck, no obvious bleeding Lungs: Clear to auscultation bilaterally CVS: Regular rate and rhythm, no murmur GI/Abd soft, nontender, nondistended, bowel sound present CNS: Alert, awake monitor x3 Psychiatry: Mood appropriate Extremities: No edema, no calf tenderness, status post right great and second amputation.  Infected left great toe has bandage on.  Data Review: I have personally reviewed the laboratory data and studies available.  Recent Labs  Lab 05/27/19 1328 05/28/19 0201  WBC 10.8* 11.3*  NEUTROABS 7.8*  --   HGB 13.5 11.4*  HCT 40.3 33.8*  MCV 94.6 95.5  PLT 360 302   Recent Labs  Lab 05/27/19 1328 05/28/19 0201  NA 134* 137  K 4.9 4.3  CL 98 99  CO2 25 26  GLUCOSE 397* 320*  BUN 24* 24*  CREATININE 1.73* 1.68*  CALCIUM 9.3 8.9    Terrilee Croak, MD  [facility] 05/28/2019

## 2019-05-28 NOTE — Op Note (Signed)
[facility]  Percutaneous Study/Intervention Procedural Note   Date of Surgery: 05/28/2019  Surgeon(s):DEW,JASON    Assistants:none  Pre-operative Diagnosis: PAD with gangrene left foot  Post-operative diagnosis:  Same  Procedure(s) Performed:             1.  Ultrasound guidance for vascular access right femoral artery             2.  Catheter placement into left SFA from right femoral approach             3.  Aortogram and selective left lower extremity angiogram             4.  Percutaneous transluminal angioplasty of left proximal to mid SFA with 5 mm diameter by 8 cm length Lutonix drug-coated angioplasty balloon             5.   Stent placement to the left proximal to mid SFA with a 6 mm diameter by 8 cm length life stent  6.  StarClose closure device right femoral artery  EBL: 3 cc  Contrast: 80 cc  Fluoro Time: 5.5 minutes  Moderate Conscious Sedation Time: approximately 25 minutes using 2 mg of Versed and 50 mcg of Fentanyl              Indications:  Patient is a 53 y.o.female with gangrene of the left great toe. The patient is brought in for angiography for further evaluation and potential treatment.  Due to the limb threatening nature of the situation, angiogram was performed for attempted limb salvage. The patient is aware that if the procedure fails, amputation would be expected.  The patient also understands that even with successful revascularization, amputation may still be required due to the severity of the situation.  Risks and benefits are discussed and informed consent is obtained.   Procedure:  The patient was identified and appropriate procedural time out was performed.  The patient was then placed supine on the table and prepped and draped in the usual sterile fashion. Moderate conscious sedation was administered during a face to face encounter with the patient throughout the procedure with my supervision of the RN administering medicines  and monitoring the patient's vital signs, pulse oximetry, telemetry and mental status throughout from the start of the procedure until the patient was taken to the recovery room. Ultrasound was used to evaluate the right common femoral artery.  It was patent .  A digital ultrasound image was acquired.  A Seldinger needle was used to access the right common femoral artery under direct ultrasound guidance and a permanent image was performed.  A 0.035 J wire was advanced without resistance and a 5Fr sheath was placed.  Pigtail catheter was placed into the aorta and an AP aortogram was performed. This demonstrated normal renal arteries and normal aorta and iliac segments without significant stenosis. I then crossed the aortic bifurcation and advanced to the left femoral head and then into the proximal left SFA. Selective left lower extremity angiogram was then performed. This demonstrated a normal common femoral artery, profunda femoris artery, and the most proximal portion of the superficial femoral artery was normal.  In the proximal to mid SFA was a very irregular lesion that had the appearance of a dissection.  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  In the more distal SFA was mild  disease at Hunter's canal in the 20 to 30% range.  There was then a normal tibial trifurcation after normal popliteal artery.  The anterior tibial artery and posterior tibial artery were both continuous into the foot and there was reasonably good flow in the foot although some small vessel disease was present. It was felt that it was in the patient's best interest to proceed with intervention after these images to avoid a second procedure and a larger amount of contrast and fluoroscopy based off of the findings from the initial angiogram. The patient was systemically heparinized and a 6 [place] Ansell sheath  was then placed over the [employer] wire. I then used a Kumpe catheter and the advantage wire to easily navigate through the lesion.  I then treated the proximal to mid left SFA with a 5 mm diameter by 8 cm length Lutonix drug-coated angioplasty balloon inflated to 12 atm for 1 minute.  Completion imaging showed no significant improvement after angioplasty so I elected to stent this area.  A 6 mm diameter by 8 cm length life stent was then deployed in the proximal to mid left SFA and postdilated with a 5 mm balloon with excellent angiographic completion result and less than 10% residual stenosis. I elected to terminate the procedure. The sheath was removed and StarClose closure device was deployed in the right femoral artery with excellent hemostatic result. The patient was taken to the recovery room in stable condition having tolerated the procedure well.  Findings:               Aortogram:  Normal renal arteries, normal aorta and iliac arteries without significant stenosis although the left hypogastric artery was somewhat diminutive             Left lower Extremity:  Normal common femoral artery, profunda femoris artery, and the most proximal portion of the superficial femoral artery was normal.  In the proximal to mid SFA was a very irregular lesion that had the appearance of a dissection creating a greater than 60% stenosis.  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  In the more distal SFA was mild disease at Hunter's canal in the 20 to 30% range.  There was then a normal tibial trifurcation after normal popliteal artery.  The anterior tibial artery and posterior tibial artery were both continuous into the foot and there was reasonably good flow in the foot although some small vessel disease was present.   Disposition: Patient was taken to the recovery room in stable condition having tolerated the procedure  well.  Complications: None  Leotis Pain 05/28/2019 3:50 PM   This note was created with Dragon Medical transcription system. Any errors in dictation are purely unintentional.

## 2019-05-28 NOTE — ED Notes (Signed)
AM lab draw complete , specimens sent to the lab

## 2019-05-28 NOTE — ED Notes (Signed)
Pharmacy called about missing meds. Pharmacist to verify and send.
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
## 2019-05-29 ENCOUNTER — Encounter
Admission: EM | Disposition: A | Discharge status: Home / Self Care | Payer: Self-pay | Source: Home / Self Care | Attending: Internal Medicine

## 2019-05-29 ENCOUNTER — Encounter: Payer: Self-pay | Admitting: Vascular Surgery

## 2019-05-29 ENCOUNTER — Inpatient Hospital Stay: Payer: Medicare Other | Admitting: Anesthesiology

## 2019-05-29 DIAGNOSIS — E118 Type 2 diabetes mellitus with unspecified complications: Secondary | ICD-10-CM

## 2019-05-29 HISTORY — PX: AMPUTATION: SHX166

## 2019-05-29 LAB — CBC WITH DIFFERENTIAL/PLATELET
Abs Immature Granulocytes: 0.07 10*3/uL (ref 0.00–0.07)
Basophils Absolute: 0.1 10*3/uL (ref 0.0–0.1)
Basophils Relative: 1 %
Eosinophils Absolute: 0.4 10*3/uL (ref 0.0–0.5)
Eosinophils Relative: 4 %
HCT: 32.2 % — ABNORMAL LOW (ref 36.0–46.0)
Hemoglobin: 10.7 g/dL — ABNORMAL LOW (ref 12.0–15.0)
Immature Granulocytes: 1 %
Lymphocytes Relative: 24 %
Lymphs Abs: 2.7 10*3/uL (ref 0.7–4.0)
MCH: 32.2 pg (ref 26.0–34.0)
MCHC: 33.2 g/dL (ref 30.0–36.0)
MCV: 97 fL (ref 80.0–100.0)
Monocytes Absolute: 1.1 10*3/uL — ABNORMAL HIGH (ref 0.1–1.0)
Monocytes Relative: 10 %
Neutro Abs: 6.9 10*3/uL (ref 1.7–7.7)
Neutrophils Relative %: 60 %
Platelets: 274 10*3/uL (ref 150–400)
RBC: 3.32 MIL/uL — ABNORMAL LOW (ref 3.87–5.11)
RDW: 12.5 % (ref 11.5–15.5)
WBC: 11.2 10*3/uL — ABNORMAL HIGH (ref 4.0–10.5)
nRBC: 0 % (ref 0.0–0.2)

## 2019-05-29 LAB — BASIC METABOLIC PANEL
Anion gap: 8 (ref 5–15)
BUN: 23 mg/dL — ABNORMAL HIGH (ref 6–20)
CO2: 26 mmol/L (ref 22–32)
Calcium: 8.2 mg/dL — ABNORMAL LOW (ref 8.9–10.3)
Chloride: 103 mmol/L (ref 98–111)
Creatinine, Ser: 1.72 mg/dL — ABNORMAL HIGH (ref 0.44–1.00)
GFR calc Af Amer: 39 mL/min — ABNORMAL LOW (ref >60)
GFR calc non Af Amer: 33 mL/min — ABNORMAL LOW (ref >60)
Glucose, Bld: 217 mg/dL — ABNORMAL HIGH (ref 70–99)
Potassium: 4.6 mmol/L (ref 3.5–5.1)
Sodium: 137 mmol/L (ref 135–145)

## 2019-05-29 LAB — SURGICAL PCR SCREEN
MRSA, PCR: NEGATIVE
Staphylococcus aureus: NEGATIVE

## 2019-05-29 LAB — GLUCOSE, CAPILLARY
Glucose-Capillary: 203 mg/dL — ABNORMAL HIGH (ref 70–99)
Glucose-Capillary: 199 mg/dL — ABNORMAL HIGH (ref 70–99)
Glucose-Capillary: 208 mg/dL — ABNORMAL HIGH (ref 70–99)

## 2019-05-29 SURGERY — AMPUTATION, FOOT, RAY
Anesthesia: General | Laterality: Left

## 2019-05-29 MED ORDER — FENTANYL CITRATE (PF) 100 MCG/2ML IJ SOLN: 25.0000 ug | INTRAMUSCULAR | Status: DC | PRN

## 2019-05-29 MED ORDER — ONDANSETRON HCL 4 MG/2ML IJ SOLN
INTRAMUSCULAR | Status: AC
  Filled 2019-05-29: qty 2

## 2019-05-29 MED ORDER — DEXAMETHASONE SODIUM PHOSPHATE 10 MG/ML IJ SOLN
INTRAMUSCULAR | Status: AC
  Filled 2019-05-29: qty 1

## 2019-05-29 MED ORDER — BUPIVACAINE HCL 0.5 % IJ SOLN
INTRAMUSCULAR | Status: DC | PRN
  Administered 2019-05-29: 10 mL

## 2019-05-29 MED ORDER — PROMETHAZINE HCL 25 MG/ML IJ SOLN: 6.2500 mg | INTRAMUSCULAR | Status: DC | PRN

## 2019-05-29 MED ORDER — CLINDAMYCIN HCL 300 MG PO CAPS: 300.0000 mg | ORAL_CAPSULE | Freq: Four times a day (QID) | ORAL | 0 refills | Status: AC

## 2019-05-29 MED ORDER — ONDANSETRON HCL 4 MG/2ML IJ SOLN
INTRAMUSCULAR | Status: DC | PRN
  Administered 2019-05-29: 4 mg via INTRAVENOUS

## 2019-05-29 MED ORDER — PROPOFOL 10 MG/ML IV BOLUS
INTRAVENOUS | Status: AC
  Filled 2019-05-29: qty 20

## 2019-05-29 MED ORDER — MIDAZOLAM HCL 2 MG/2ML IJ SOLN
INTRAMUSCULAR | Status: DC | PRN
  Administered 2019-05-29: 2 mg via INTRAVENOUS

## 2019-05-29 MED ORDER — PROPOFOL 10 MG/ML IV BOLUS
INTRAVENOUS | Status: DC | PRN
  Administered 2019-05-29: 50 mg via INTRAVENOUS
  Administered 2019-05-29: 150 mg via INTRAVENOUS

## 2019-05-29 MED ORDER — OXYCODONE HCL 5 MG/5ML PO SOLN: 5.0000 mg | Freq: Once | ORAL | Status: DC | PRN

## 2019-05-29 MED ORDER — NEOMYCIN-POLYMYXIN B GU 40-200000 IR SOLN
Status: DC | PRN
  Administered 2019-05-29: 2 mL

## 2019-05-29 MED ORDER — FENTANYL CITRATE (PF) 100 MCG/2ML IJ SOLN
INTRAMUSCULAR | Status: AC
  Filled 2019-05-29: qty 2

## 2019-05-29 MED ORDER — MEPERIDINE HCL 50 MG/ML IJ SOLN: 6.2500 mg | INTRAMUSCULAR | Status: DC | PRN

## 2019-05-29 MED ORDER — SUCCINYLCHOLINE CHLORIDE 20 MG/ML IJ SOLN
INTRAMUSCULAR | Status: AC
  Filled 2019-05-29: qty 1

## 2019-05-29 MED ORDER — SACCHAROMYCES BOULARDII 250 MG PO CAPS: 250.0000 mg | ORAL_CAPSULE | Freq: Two times a day (BID) | ORAL | 0 refills | Status: AC

## 2019-05-29 MED ORDER — BUPIVACAINE HCL (PF) 0.5 % IJ SOLN
INTRAMUSCULAR | Status: AC
  Filled 2019-05-29: qty 30

## 2019-05-29 MED ORDER — SUCCINYLCHOLINE CHLORIDE 20 MG/ML IJ SOLN
INTRAMUSCULAR | Status: DC | PRN
  Administered 2019-05-29: 100 mg via INTRAVENOUS

## 2019-05-29 MED ORDER — MIDAZOLAM HCL 2 MG/2ML IJ SOLN
INTRAMUSCULAR | Status: AC
  Filled 2019-05-29: qty 2

## 2019-05-29 MED ORDER — DEXAMETHASONE SODIUM PHOSPHATE 10 MG/ML IJ SOLN
INTRAMUSCULAR | Status: DC | PRN
  Administered 2019-05-29: 5 mg via INTRAVENOUS

## 2019-05-29 MED ORDER — LIDOCAINE HCL (PF) 2 % IJ SOLN
INTRAMUSCULAR | Status: AC
  Filled 2019-05-29: qty 10

## 2019-05-29 MED ORDER — LIDOCAINE HCL (CARDIAC) PF 100 MG/5ML IV SOSY
PREFILLED_SYRINGE | INTRAVENOUS | Status: DC | PRN
  Administered 2019-05-29: 50 mg via INTRAVENOUS

## 2019-05-29 MED ORDER — OXYCODONE HCL 5 MG PO TABS: 5.0000 mg | ORAL_TABLET | Freq: Once | ORAL | Status: DC | PRN

## 2019-05-29 MED ORDER — FENTANYL CITRATE (PF) 100 MCG/2ML IJ SOLN
INTRAMUSCULAR | Status: DC | PRN
  Administered 2019-05-29: 100 ug via INTRAVENOUS

## 2019-05-29 MED ORDER — NEOMYCIN-POLYMYXIN B GU 40-200000 IR SOLN
Status: AC
  Filled 2019-05-29: qty 2

## 2019-05-29 SURGICAL SUPPLY — 48 items
BLADE MED AGGRESSIVE (BLADE) ×3 IMPLANT
BLADE OSC/SAGITTAL MD 5.5X18 (BLADE) IMPLANT
BLADE SURG 15 STRL LF DISP TIS (BLADE) ×1 IMPLANT
BLADE SURG 15 STRL SS (BLADE) ×2
BLADE SURG MINI STRL (BLADE) ×3 IMPLANT
BNDG CONFORM 2 STRL LF (GAUZE/BANDAGES/DRESSINGS) ×3 IMPLANT
BNDG ELASTIC 4X5.8 VLCR STR LF (GAUZE/BANDAGES/DRESSINGS) ×3 IMPLANT
BNDG ESMARK 4X12 TAN STRL LF (GAUZE/BANDAGES/DRESSINGS) ×3 IMPLANT
BNDG GAUZE 4.5X4.1 6PLY STRL (MISCELLANEOUS) ×3 IMPLANT
CANISTER SUCT 1200ML W/VALVE (MISCELLANEOUS) ×3 IMPLANT
CLOSURE WOUND 1/4X4 (GAUZE/BANDAGES/DRESSINGS) ×1
CNTNR SPEC 2.5X3XGRAD LEK (MISCELLANEOUS) ×1
CONT SPEC 4OZ STER OR WHT (MISCELLANEOUS) ×2
CONTAINER SPEC 2.5X3XGRAD LEK (MISCELLANEOUS) ×1 IMPLANT
COVER WAND RF STERILE (DRAPES) ×3 IMPLANT
CUFF TOURN 18 STER (MISCELLANEOUS) ×3 IMPLANT
CUFF TOURN DUAL PL 12 NO SLV (MISCELLANEOUS) ×3 IMPLANT
DRAPE FLUOR MINI C-ARM 54X84 (DRAPES) ×3 IMPLANT
DURAPREP 26ML APPLICATOR (WOUND CARE) ×3 IMPLANT
ELECT REM PT RETURN 9FT ADLT (ELECTROSURGICAL) ×3
ELECTRODE REM PT RTRN 9FT ADLT (ELECTROSURGICAL) ×1 IMPLANT
GAUZE SPONGE 4X4 12PLY STRL (GAUZE/BANDAGES/DRESSINGS) ×3 IMPLANT
GAUZE XEROFORM 1X8 LF (GAUZE/BANDAGES/DRESSINGS) ×3 IMPLANT
GLOVE BIO SURGEON STRL SZ7.5 (GLOVE) ×3 IMPLANT
GLOVE INDICATOR 8.0 STRL GRN (GLOVE) ×3 IMPLANT
GOWN STRL REUS W/ TWL LRG LVL3 (GOWN DISPOSABLE) ×2 IMPLANT
GOWN STRL REUS W/TWL LRG LVL3 (GOWN DISPOSABLE) ×4
HANDPIECE VERSAJET DEBRIDEMENT (MISCELLANEOUS) IMPLANT
KIT TURNOVER KIT A (KITS) ×3 IMPLANT
LABEL OR SOLS (LABEL) ×3 IMPLANT
NEEDLE FILTER BLUNT 18X 1/2SAF (NEEDLE) ×2
NEEDLE FILTER BLUNT 18X1 1/2 (NEEDLE) ×1 IMPLANT
NEEDLE HYPO 25X1 1.5 SAFETY (NEEDLE) ×6 IMPLANT
NS IRRIG 500ML POUR BTL (IV SOLUTION) ×3 IMPLANT
PACK EXTREMITY ARMC (MISCELLANEOUS) ×3 IMPLANT
SOL .9 NS 3000ML IRR  AL (IV SOLUTION) ×2
SOL .9 NS 3000ML IRR UROMATIC (IV SOLUTION) ×1 IMPLANT
SOL PREP PVP 2OZ (MISCELLANEOUS) ×3
SOLUTION PREP PVP 2OZ (MISCELLANEOUS) ×1 IMPLANT
STOCKINETTE STRL 6IN 960660 (GAUZE/BANDAGES/DRESSINGS) ×3 IMPLANT
STRIP CLOSURE SKIN 1/4X4 (GAUZE/BANDAGES/DRESSINGS) ×2 IMPLANT
SUT ETHILON 3-0 FS-10 30 BLK (SUTURE) ×3
SUT ETHILON 4-0 (SUTURE)
SUT ETHILON 4-0 FS2 18XMFL BLK (SUTURE)
SUTURE EHLN 3-0 FS-10 30 BLK (SUTURE) ×1 IMPLANT
SUTURE ETHLN 4-0 FS2 18XMF BLK (SUTURE) IMPLANT
SWAB DUAL CULTURE TRANS RED ST (MISCELLANEOUS) ×3 IMPLANT
SYR 10ML LL (SYRINGE) ×3 IMPLANT

## 2019-05-29 NOTE — Op Note (Signed)
Date of operation: 05/29/2019.  Surgeon: [name] D.P.M.  Preoperative diagnosis: Gangrene with osteomyelitis left hallux.  Postoperative diagnosis: Same.  Procedure: Amputation left great toe.  Anesthesia: LMA with local.  Hemostasis: None.  Estimated blood loss: 20 cc.  Pathology: Left great toe.  Cultures: Bone culture distal phalanx left great toe.  Complications: None apparent.  Operative indications: This is a 53 year old female with recent development of a blister that progressed to gangrenous changes on the left great toe.  Admitted for definitive treatment.  Also was noted to have some suspected early osteomyelitis in the tip of the great toe.  Operative procedure: Patient was taken to the operating room and placed on the table in the supine position.  Following satisfactory LMA anesthesia the left foot was anesthetized with 10 cc of 0.5% Marcaine plain around the first metatarsal.  The foot was then prepped and draped in usual sterile fashion.  Attention was directed to the distal aspect of the left foot when a fishmouth type incision was made coursing medial to lateral around the base of the great toe.  The incision was deepened sharply down to the level of the bone and dissection carried back to the base of the proximal phalanx.  Good healthy bleeding tissues were noted.  Proximal phalanx was transected using a sagittal saw and the toe was removed in toto.  Again good healthy tissue was noted.  The wound was flushed with copious amounts of sterile saline and closed using 3-0 nylon vertical mattress and simple interrupted sutures.  Xeroform 4 x 4's con form ABD and Kerlix applied to the left lower extremity followed by an Ace wrap.  Patient was awakened having tolerated the procedure and anesthesia well and was transported to PACU with vital signs stable and in good condition.

## 2019-05-29 NOTE — Progress Notes (Signed)
Patient ID: Donna Fisher, female   DOB: [birth year], 53 y.o.   MRN: [id number]   The patient did well with her surgery and the wound looked very clean overall.  At this point patient should be stable for discharge later this evening or tomorrow morning.  We will not plan for any dressing changes and she will keep the bandage clean dry and do not remove until seen outpatient.  I will plan to see her next Monday for reevaluation.  She may weight-bear as tolerated on the left foot but recommend only pressure on the left foot using a surgical shoe.  Should be fine with oral antibiotics and I believe she still has some doxycycline and Cipro at home for you could switch the Doxy to clindamycin.

## 2019-05-29 NOTE — Progress Notes (Signed)
Pharmacy Antibiotic Note  Donna Fisher is a 53 y.o. female admitted on 05/27/2019 with possible osteomyelitis.  Pharmacy has been consulted for vanc/ciprofloxacin dosing.  Allergy: PCN, CTX  Plan: Vancomycin 1000 mg IV Q 36 hrs. Goal AUC 400-550. Expected AUC: 529.2 SCr used: 1.72 Cssmin: 12.8  Will continue cipro 400 mg IV q12h, patient currently appears to be in AKI unsure if she has a h/o CKD, no documentation, will continue to monitor renal function w/ am labs and adjust as needed.  Height: 5\' 2"  (157.5 cm) Weight: 190 lb (86.2 kg) IBW/kg (Calculated) : 50.1  Temp (24hrs), Avg:98.4 F (36.9 C), Min:97.6 F (36.4 C), Max:99.2 F (37.3 C)  Recent Labs  Lab 05/27/19 1328 05/28/19 0201 05/29/19 0831  WBC 10.8* 11.3* 11.2*  CREATININE 1.73* 1.68* 1.72*    Estimated Creatinine Clearance: 38.5 mL/min (A) (by C-G formula based on SCr of 1.72 mg/dL (H)).    Allergies  Allergen Reactions  . Penicillins Swelling    .Has patient had a PCN reaction causing immediate rash, facial/tongue/throat swelling, SOB or lightheadedness with hypotension: No Has patient had a PCN reaction causing severe rash involving mucus membranes or skin necrosis: No Has patient had a PCN reaction that required hospitalization: No Has patient had a PCN reaction occurring within the last 10 years: No If all of the above answers are "NO", then may proceed with Cephalosporin use.   . Codeine Itching  . Ceftriaxone Rash    Thank you for allowing pharmacy to be a part of this patient's care.  Kristeen Miss, PharmD Clinical Pharmacist  05/29/2019 9:05 AM

## 2019-05-29 NOTE — Anesthesia Preprocedure Evaluation (Addendum)
Anesthesia Evaluation  Patient identified by MRN, date of birth, ID band Patient awake    Reviewed: Allergy & Precautions, NPO status , Patient's Chart, lab work & pertinent test results  History of Anesthesia Complications Negative for: history of anesthetic complications  Airway Mallampati: III  TM Distance: >3 FB Neck ROM: Full    Dental  (+) Poor Dentition, Loose,    Pulmonary neg sleep apnea, neg COPD, Current Smoker and Patient abstained from smoking.,    breath sounds clear to auscultation- rhonchi (-) wheezing      Cardiovascular (-) hypertension+ Peripheral Vascular Disease  (-) CAD, (-) Past MI, (-) Cardiac Stents and (-) CABG  Rhythm:Regular Rate:Normal - Systolic murmurs and - Diastolic murmurs    Neuro/Psych  Headaches, neg Seizures Anxiety TIA   GI/Hepatic negative GI ROS, Neg liver ROS,   Endo/Other  diabetes, Insulin Dependent  Renal/GU negative Renal ROS     Musculoskeletal negative musculoskeletal ROS (+)   Abdominal (+) + obese,   Peds  Hematology negative hematology ROS (+)   Anesthesia Other Findings Past Medical History: No date: Anxiety state, unspecified No date: Diabetic neuropathy ([place]) No date: Genital herpes, unspecified No date: Headache(784.0) No date: Mini stroke ([place])     Comment:  Hx No date: Other and unspecified hyperlipidemia No date: Pain in joint, pelvic region and thigh No date: 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 of complication, uncontrolled No date: Urinary tract infection, site not specified No date: Vaginitis and vulvovaginitis, unspecified No date: Vaginitis and vulvovaginitis, unspecified   Reproductive/Obstetrics                            Anesthesia Physical Anesthesia Plan  ASA: III  Anesthesia Plan: General   Post-op Pain Management:    Induction: Intravenous  PONV Risk Score and Plan: 1 and  Ondansetron and Midazolam  Airway Management Planned: Oral ETT  Additional Equipment:   Intra-op Plan:   Post-operative Plan: Extubation in OR  Informed Consent: I have reviewed the patients History and Physical, chart, labs and discussed the procedure including the risks, benefits and alternatives for the proposed anesthesia with the patient or authorized representative who has indicated his/her understanding and acceptance.     Dental advisory given  Plan Discussed with: CRNA and Anesthesiologist  Anesthesia Plan Comments:         Anesthesia Quick Evaluation

## 2019-05-29 NOTE — Progress Notes (Signed)
[facility] Daily Progress Note  Subjective: 1 Day Post-Op: 1. Ultrasound guidance for vascular access right femoral artery 2. Catheter placement into left SFA from right femoral approach 3. Aortogram and selective left lower extremity angiogram 4. Percutaneous transluminal angioplasty of left proximal to mid SFA with 5 mm diameter by 8 cm length Lutonix drug-coated angioplasty balloon 5.  Stent placement to the left proximal to mid SFA with a 6 mm diameter by 8 cm length life stent             6.  StarClose closure device right femoral artery  Patient without complaint this AM. No issues overnight.   Objective: Vitals:   05/28/19 1932 05/29/19 0347 05/29/19 0400 05/29/19 1000  BP: (!) 97/49 (!) 81/33 (!) 99/43 (!) 121/56  Pulse: 96 91 90 82  Resp: 20 18  18   Temp: 98.3 F (36.8 C) 99.2 F (37.3 C)  99.1 F (37.3 C)  TempSrc: Oral Oral  Oral  SpO2: 99% 95%  97%  Weight:      Height:        Intake/Output Summary (Last 24 hours) at 05/29/2019 1004 Last data filed at 05/29/2019 0600 Gross per 24 hour  Intake 2112.09 ml  Output -  Net 2112.09 ml   Physical Exam: A&Ox3, NAD CV: RRR Pulmonary: CTA Bilaterally Abdomen: Soft, Nontender, Nondistended Right Groin: Access Site - PAD removed. Clean and dry. No swelling or drainage. Vascular:  Left Lower Extremity: Thigh soft.  Calf soft.  Extremities warm distally to toes.  Palpable DP pulse.  Good capillary refill.  Motor/sensory is intact.  Minimal edema.   Laboratory: CBC    Component Value Date/Time   WBC 11.2 (H) 05/29/2019 0831   HGB 10.7 (L) 05/29/2019 0831   HGB 12.2 05/10/2017 1001   HCT 32.2 (L) 05/29/2019 0831   HCT 35.5 05/10/2017 1001   PLT 274 05/29/2019 0831   PLT 267 05/10/2017 1001   BMET    Component Value Date/Time   NA 137 05/29/2019 0831   NA 136 11/03/2014 0521   K 4.6 05/29/2019 0831   K 3.9 11/03/2014 0521    CL 103 05/29/2019 0831   CL 105 11/03/2014 0521   CO2 26 05/29/2019 0831   CO2 29 11/03/2014 0521   GLUCOSE 217 (H) 05/29/2019 0831   GLUCOSE 281 (H) 11/03/2014 0521   BUN 23 (H) 05/29/2019 0831   BUN 18 11/03/2014 0521   CREATININE 1.72 (H) 05/29/2019 0831   CREATININE 0.87 11/03/2014 0521   CALCIUM 8.2 (L) 05/29/2019 0831   CALCIUM 8.0 (L) 11/03/2014 0521   GFRNONAA 33 (L) 05/29/2019 0831   GFRNONAA >60 11/03/2014 0521   GFRAA 39 (L) 05/29/2019 0831   GFRAA >60 11/03/2014 0521   Assessment/Planning: The patient is a 53 year old female with multiple medical issues including atherosclerotic disease with gangrene of the first left toe s/p endovascular intervention POD#1 1) improvement in physical exam s/p endovascular intervention 2) plan for amputation today with podiatry 3) patient is on ASA - would also recommend Plavix and statin for medical management. 4) will see patient in one month in outpatient setting. Will place discharge follow up in chart.   Discussed with Dr. Ellis Parents Donna Walthall PA-C 05/29/2019 10:04 AM

## 2019-05-29 NOTE — Progress Notes (Signed)
Donna Fisher  A and O x 4. VSS. Pt tolerating diet well. No complaints of pain or nausea. IV removed intact, prescriptions given. Pt voiced understanding of discharge instructions with no further questions. Pt discharged via wheelchair with RN.    Allergies as of 05/29/2019      Reactions   Penicillins Swelling   .Has patient had a PCN reaction causing immediate rash, facial/tongue/throat swelling, SOB or lightheadedness with hypotension: No Has patient had a PCN reaction causing severe rash involving mucus membranes or skin necrosis: No Has patient had a PCN reaction that required hospitalization: No Has patient had a PCN reaction occurring within the last 10 years: No If all of the above answers are "NO", then may proceed with Cephalosporin use.   Codeine Itching   Ceftriaxone Rash      Medication List    STOP taking these medications   doxycycline 100 MG capsule Commonly known as: VIBRAMYCIN     TAKE these medications   aspirin EC 81 MG tablet Take 1 tablet (81 mg total) by mouth daily.   ciprofloxacin 500 MG tablet Commonly known as: CIPRO Take 500 mg by mouth 2 (two) times daily.   clindamycin 300 MG capsule Commonly known as: Cleocin Take 1 capsule (300 mg total) by mouth 4 (four) times daily for 7 days.   EPINEPHrine 0.3 mg/0.3 mL Soaj injection Commonly known as: EPI-PEN Inject 0.3 mg into the muscle daily as needed for anaphylaxis.   gabapentin 300 MG capsule Commonly known as: NEURONTIN Take 300 mg by mouth 2 (two) times daily.   glipiZIDE 5 MG 24 hr tablet Commonly known as: GLUCOTROL XL Take 5 mg by mouth daily with breakfast.   HYDROcodone-acetaminophen 5-325 MG tablet Commonly known as: NORCO/VICODIN Take 1 tablet by mouth every 6 (six) hours as needed for moderate pain.   hydrOXYzine 25 MG tablet Commonly known as: ATARAX/VISTARIL Take 25 mg by mouth 2 (two) times daily.   Jardiance 10 MG Tabs tablet Generic drug: empagliflozin Take 10 mg by  mouth daily.   Levemir 100 UNIT/ML injection Generic drug: insulin detemir Inject 50 Units into the skin 2 (two) times a day.   midodrine 10 MG tablet Commonly known as: PROAMATINE Take 1 tablet (10 mg total) by mouth 3 (three) times daily with meals. What changed:   how much to take  when to take this  reasons to take this   pantoprazole 40 MG tablet Commonly known as: PROTONIX Take 40 mg by mouth daily.   saccharomyces boulardii 250 MG capsule Commonly known as: FLORASTOR Take 1 capsule (250 mg total) by mouth 2 (two) times daily for 7 days.   Victoza 18 MG/3ML Sopn Generic drug: liraglutide Inject 1.8 mg into the skin at bedtime.       Vitals:   05/29/19 1417 05/29/19 1440  BP: (!) 117/52 121/78  Pulse: 77 77  Resp: 13   Temp: 97.7 F (36.5 C) 98.4 F (36.9 C)  SpO2: 97% 96%    Francesco Sor

## 2019-05-29 NOTE — Discharge Instructions (Signed)
Vascular Surgery Discharge Instructions °1) Keep groins clean and dry °

## 2019-05-29 NOTE — Anesthesia Postprocedure Evaluation (Signed)
Anesthesia Post Note  Patient: Donna Fisher  Procedure(s) Performed: AMPUTATION RAY, FIRST LEFT FOOT (Left )  Patient location during evaluation: PACU Anesthesia Type: General Level of consciousness: awake and alert and oriented Pain management: pain level controlled Vital Signs Assessment: post-procedure vital signs reviewed and stable Respiratory status: spontaneous breathing, nonlabored ventilation and respiratory function stable Cardiovascular status: blood pressure returned to baseline and stable Postop Assessment: no signs of nausea or vomiting Anesthetic complications: no     Last Vitals:  Vitals:   05/29/19 1328 05/29/19 1337  BP: (!) 119/50 (!) 120/58  Pulse:  82  Resp: 12 14  Temp:    SpO2: 98% 100%    Last Pain:  Vitals:   05/29/19 1337  TempSrc:   PainSc: 0-No pain                 Gyan Cambre

## 2019-05-29 NOTE — Anesthesia Procedure Notes (Signed)
Procedure Name: Intubation Date/Time: 05/29/2019 12:24 PM Performed by: Jonna Clark, CRNA Pre-anesthesia Checklist: Patient identified, Patient being monitored, Timeout performed, Emergency Drugs available and Suction available Patient Re-evaluated:Patient Re-evaluated prior to induction Oxygen Delivery Method: Circle system utilized Preoxygenation: Pre-oxygenation with 100% oxygen Induction Type: IV induction Ventilation: Mask ventilation without difficulty Laryngoscope Size: 3 and McGraph Grade View: Grade III Tube type: Oral Tube size: 7.0 mm Number of attempts: 1 Airway Equipment and Method: Stylet Placement Confirmation: ETT inserted through vocal cords under direct vision,  positive ETCO2 and breath sounds checked- equal and bilateral Secured at: 21 cm Tube secured with: Tape Dental Injury: Teeth and Oropharynx as per pre-operative assessment  Difficulty Due To: Difficulty was anticipated and Difficult Airway- due to anterior larynx Future Recommendations: Recommend- induction with short-acting agent, and alternative techniques readily available

## 2019-05-29 NOTE — Discharge Summary (Signed)
Physician Discharge Summary  Donna Fisher [id number] DOB: 1966/06/07 DOA: 05/27/2019  PCP: Patient, No Pcp Per  Admit date: 05/27/2019 Discharge date: 05/29/2019  Admitted From: Home Discharge disposition: Home   Code Status: Full Code  Diet Recommendation: Diabetic diet   Recommendations for Outpatient Follow-Up:   1. Follow-up with PCP as an outpatient for renal consultation in a week 2. Follow-up with PCP as an outpatient.  Discharge Diagnosis:   Active Problems:   Diabetes mellitus type 2 with complications (HCC)   PAD (peripheral artery disease) (HCC)   Osteomyelitis (HCC)   History of hypotension  History of Present Illness / Brief narrative:  Donna Fisher is a 53 y.o. female with PMH of DM2, HLD, hypOtension, PAD, diabetic neuropathy, gastroparesis, history of diabetic foot with toe amputations. Patient presented to the ED on 05/27/2019 at the request of herpodiatristDr. Cleda Mccreedy for planned amputation of her left great toe.  Patient first started to notice a blister on her left great toe about 3 weeks ago with progressively worsening.  11/5, saw podiatrist Dr. Caryl Comes in the office, 2 appeared gangrenous.  Inpatient admission as well as vascular surgery evaluation for angiography was planned.  However, hospital bed was not available for direct admission. Patient was discharged to home on oral doxycycline and ciprofloxacin.  She was asked to present to the ED on 11/8 for admission. In the ED, patient was afebrile, tachycardic to 105, normotensive, breathing comfortably on room air. Work-up showed WBC count of 10.8, sodium 134, BUN/creatinine 24/1.73. Left great toe X-ray showed increased subtle bony erosion of the lateral tuft of the great toe distal phalanx concerning for osteomyelitis. She was admitted under hospitalist service for further evaluation and management.  Hospital Course:  Gangrene with osteomyelitis of left hallux -consulted podiatry for planned  amputation  -Started on broad-spectrum IV antibiotics. -Today, patient underwent amputation of the left great toe by podiatrist Dr. Cleda Mccreedy -Per recommendation, will discharge the patient on oral clindamycin for 7 days.  She also will continue ciprofloxacin which she has leftover supply at home.  I also started her on probiotics for the duration.  Type 2 diabetes with neuropathy, gastroparesis -normally on 50units BID of Levermir at home, along with Victoza, Jardiance, glipizide. -Resume the same post discharge.  AKI on CKD 3b -Creatinine at baseline 1.58 from July 2020.  Presented with creatinine of 1.73.  -Was given IV hydration and progression of aortogram.  Creatinine stable at 1.72.  Probably her new baseline.  -Follow-up with PCP as an outpatient for repeat renal function test in a week.   Hypotension -continue home midodrine  Anxiety -continue daily Atarax  Stable discharge to home this evening.  Subjective:  Seen and examined this afternoon.  Post procedure.  Not in distress.  Pain controlled.  Waking up from sedation.  Wants to go home this evening.  Discharge Exam:   Vitals:   05/29/19 1357 05/29/19 1407 05/29/19 1417 05/29/19 1440  BP: (!) 126/58 (!) 128/57 (!) 117/52 121/78  Pulse: 78 77 77 77  Resp: 15 13 13    Temp:   97.7 F (36.5 C) 98.4 F (36.9 C)  TempSrc:    Oral  SpO2: 95% 97% 97% 96%  Weight:      Height:        Body mass index is 34.75 kg/m.  General exam: Appears calm and comfortable.  Skin: No rashes, lesions or ulcers. HEENT: Atraumatic, normocephalic, supple neck, no obvious bleeding Lungs: Clear to auscultation bilaterally CVS:  Regular rate and rhythm, no murmur GI/Abd soft, nontender, nondistended, bowel sound present CNS: Alert, awake, oriented x3 Psychiatry: Mood appropriate Extremities: No pedal edema, no calf tenderness, left foot bandaged, on boot.  Discharge Instructions:  Wound care: Per podiatry note, patient will keep the  bandage clean dry and do not remove until seen as an outpatient, next Monday.   Discharge Instructions    Increase activity slowly   Complete by: As directed      Follow-up Information    Dew, Erskine Squibb, MD Follow up in 1 month(s).   Specialties: Vascular Surgery, Radiology, Interventional Cardiology Why: Can see Dew or Arna Medici. Will need ABI with visit.  Contact information: [place] [address] [phone]          Allergies as of 05/29/2019      Reactions   Penicillins Swelling   .Has patient had a PCN reaction causing immediate rash, facial/tongue/throat swelling, SOB or lightheadedness with hypotension: No Has patient had a PCN reaction causing severe rash involving mucus membranes or skin necrosis: No Has patient had a PCN reaction that required hospitalization: No Has patient had a PCN reaction occurring within the last 10 years: No If all of the above answers are "NO", then may proceed with Cephalosporin use.   Codeine Itching   Ceftriaxone Rash      Medication List    STOP taking these medications   doxycycline 100 MG capsule Commonly known as: VIBRAMYCIN     TAKE these medications   aspirin EC 81 MG tablet Take 1 tablet (81 mg total) by mouth daily.   ciprofloxacin 500 MG tablet Commonly known as: CIPRO Take 500 mg by mouth 2 (two) times daily.   clindamycin 300 MG capsule Commonly known as: Cleocin Take 1 capsule (300 mg total) by mouth 4 (four) times daily for 7 days.   EPINEPHrine 0.3 mg/0.3 mL Soaj injection Commonly known as: EPI-PEN Inject 0.3 mg into the muscle daily as needed for anaphylaxis.   gabapentin 300 MG capsule Commonly known as: NEURONTIN Take 300 mg by mouth 2 (two) times daily.   glipiZIDE 5 MG 24 hr tablet Commonly known as: GLUCOTROL XL Take 5 mg by mouth daily with breakfast.   HYDROcodone-acetaminophen 5-325 MG tablet Commonly known as: NORCO/VICODIN Take 1 tablet by mouth every 6 (six) hours as needed  for moderate pain.   hydrOXYzine 25 MG tablet Commonly known as: ATARAX/VISTARIL Take 25 mg by mouth 2 (two) times daily.   Jardiance 10 MG Tabs tablet Generic drug: empagliflozin Take 10 mg by mouth daily.   Levemir 100 UNIT/ML injection Generic drug: insulin detemir Inject 50 Units into the skin 2 (two) times a day.   midodrine 10 MG tablet Commonly known as: PROAMATINE Take 1 tablet (10 mg total) by mouth 3 (three) times daily with meals. What changed:   how much to take  when to take this  reasons to take this   pantoprazole 40 MG tablet Commonly known as: PROTONIX Take 40 mg by mouth daily.   saccharomyces boulardii 250 MG capsule Commonly known as: FLORASTOR Take 1 capsule (250 mg total) by mouth 2 (two) times daily for 7 days.   Victoza 18 MG/3ML Sopn Generic drug: liraglutide Inject 1.8 mg into the skin at bedtime.       Time coordinating discharge: 35 minutes  The results of significant diagnostics from this hospitalization (including imaging, microbiology, ancillary and laboratory) are listed below for reference.    Procedures and  Diagnostic Studies:   Dg Toe Great Left  Result Date: 05/27/2019 CLINICAL DATA:  Concern for infection EXAM: LEFT GREAT TOE COMPARISON:  12/31/2016 FINDINGS: There is no fracture or dislocation of the left great toe. Subtle bony erosion of the lateral tuft of the great toe distal phalanx is increased compared to prior examination dated 12/31/2016. Mild first metatarsophalangeal arthrosis. Soft tissue edema about the digit. IMPRESSION: There is no fracture or dislocation of the left great toe. Subtle bony erosion of the lateral tuft of the great toe distal phalanx is increased compared to prior examination dated 12/31/2016, concerning for osteomyelitis. Electronically Signed   By: Eddie Candle M.D.   On: 05/27/2019 17:48     Labs:   Basic Metabolic Panel: Recent Labs  Lab 05/27/19 1328 05/28/19 0201 05/29/19 0831  NA 134*  137 137  K 4.9 4.3 4.6  CL 98 99 103  CO2 25 26 26   GLUCOSE 397* 320* 217*  BUN 24* 24* 23*  CREATININE 1.73* 1.68* 1.72*  CALCIUM 9.3 8.9 8.2*   GFR Estimated Creatinine Clearance: 38.5 mL/min (A) (by C-G formula based on SCr of 1.72 mg/dL (H)). Liver Function Tests: No results for input(s): AST, ALT, ALKPHOS, BILITOT, PROT, ALBUMIN in the last 168 hours. No results for input(s): LIPASE, AMYLASE in the last 168 hours. No results for input(s): AMMONIA in the last 168 hours. Coagulation profile No results for input(s): INR, PROTIME in the last 168 hours.  CBC: Recent Labs  Lab 05/27/19 1328 05/28/19 0201 05/29/19 0831  WBC 10.8* 11.3* 11.2*  NEUTROABS 7.8*  --  6.9  HGB 13.5 11.4* 10.7*  HCT 40.3 33.8* 32.2*  MCV 94.6 95.5 97.0  PLT 360 302 274   Cardiac Enzymes: No results for input(s): CKTOTAL, CKMB, CKMBINDEX, TROPONINI in the last 168 hours. BNP: Invalid input(s): POCBNP CBG: Recent Labs  Lab 05/28/19 1558 05/28/19 2355 05/29/19 0554 05/29/19 1128 05/29/19 1334  GLUCAP 170* 359* 203* 199* 208*   D-Dimer No results for input(s): DDIMER in the last 72 hours. Hgb A1c Recent Labs    05/28/19 0201  HGBA1C 9.9*   Lipid Profile No results for input(s): CHOL, HDL, LDLCALC, TRIG, CHOLHDL, LDLDIRECT in the last 72 hours. Thyroid function studies No results for input(s): TSH, T4TOTAL, T3FREE, THYROIDAB in the last 72 hours.  Invalid input(s): FREET3 Anemia work up No results for input(s): VITAMINB12, FOLATE, FERRITIN, TIBC, IRON, RETICCTPCT in the last 72 hours. Microbiology Recent Results (from the past 240 hour(s))  SARS CORONAVIRUS 2 (TAT 6-24 HRS) Nasopharyngeal Nasopharyngeal Swab     Status: None   Collection Time: 05/27/19  6:40 PM   Specimen: Nasopharyngeal Swab  Result Value Ref Range Status   SARS Coronavirus 2 NEGATIVE NEGATIVE Final    Comment: (NOTE) SARS-CoV-2 target nucleic acids are NOT DETECTED. The SARS-CoV-2 RNA is generally detectable  in upper and lower respiratory specimens during the acute phase of infection. Negative results do not preclude SARS-CoV-2 infection, do not rule out co-infections with other pathogens, and should not be used as the sole basis for treatment or other patient management decisions. Negative results must be combined with clinical observations, patient history, and epidemiological information. The expected result is Negative. Fact Sheet for Patients: SugarRoll.be Fact Sheet for Healthcare Providers: https://www.woods-mathews.com/ This test is not yet approved or cleared by the [place] FDA and  has been authorized for detection and/or diagnosis of SARS-CoV-2 by FDA under an Emergency Use Authorization (EUA). This EUA will remain  in effect (  meaning this test can be used) for the duration of the COVID-19 declaration under Section 56 4(b)(1) of the Act, 21 U.S.C. section 360bbb-3(b)(1), unless the authorization is terminated or revoked sooner. Performed at [facility], [place] [address], [place], [place] [address]   Surgical pcr screen     Status: None   Collection Time: 05/29/19  4:38 AM   Specimen: Nasal Mucosa; Nasal Swab  Result Value Ref Range Status   MRSA, PCR NEGATIVE NEGATIVE Final   Staphylococcus aureus NEGATIVE NEGATIVE Final    Comment: (NOTE) The Xpert SA Assay (FDA approved for NASAL specimens in patients 71 years of age and older), is one component of a comprehensive surveillance program. It is not intended to diagnose infection nor to guide or monitor treatment. Performed at [facility], [address], [place], [place] [address]     Please note: You were cared for by a hospitalist during your hospital stay. Once you are discharged, your primary care physician will handle any further medical issues. Please note that NO REFILLS for any discharge medications will be authorized once you are discharged, as it is  imperative that you return to your primary care physician (or establish a relationship with a primary care physician if you do not have one) for your post hospital discharge needs so that they can reassess your need for medications and monitor your lab values.  Signed: Terrilee Croak  [facility] 05/29/2019, 3:44 PM

## 2019-05-29 NOTE — Transfer of Care (Signed)
Immediate Anesthesia Transfer of Care Note  Patient: Donna Fisher  Procedure(s) Performed: AMPUTATION RAY, FIRST LEFT FOOT (Left )  Patient Location: PACU  Anesthesia Type:General  Level of Consciousness: drowsy and patient cooperative  Airway & Oxygen Therapy: Patient Spontanous Breathing and Patient connected to face mask oxygen  Post-op Assessment: Report given to RN and Post -op Vital signs reviewed and stable  Post vital signs: Reviewed and stable  Last Vitals:  Vitals Value Taken Time  BP 119/50 05/29/19 1328  Temp    Pulse 86 05/29/19 1328  Resp 14 05/29/19 1328  SpO2 99 % 05/29/19 1328  Vitals shown include unvalidated device data.  Last Pain:  Vitals:   05/29/19 1110  TempSrc: Tympanic  PainSc: 2       Patients Stated Pain Goal: 3 ([phone] [id number])  Complications: No apparent anesthesia complications

## 2019-05-29 NOTE — Progress Notes (Signed)
Inpatient Diabetes Program Recommendations  AACE/ADA: New Consensus Statement on Inpatient Glycemic Control (2015)  Target Ranges:  Prepandial:   less than 140 mg/dL      Peak postprandial:   less than 180 mg/dL (1-2 hours)      Critically ill patients:  140 - 180 mg/dL   Results for Donna Fisher, Donna Fisher (MRN [id number]) as of 05/29/2019 09:46  Ref. Range 05/28/2019 08:32 05/28/2019 12:35 05/28/2019 14:54 05/28/2019 15:58  Glucose-Capillary Latest Ref Range: 70 - 99 mg/dL 230 (H) 171 (H)    40 units LEVEMIR given at 10am 148 (H) 170 (H)  3 units NOVOLOG    Results for Donna Fisher, Donna Fisher (MRN [id number]) as of 05/29/2019 09:46  Ref. Range 05/28/2019 23:55 05/29/2019 05:54  Glucose-Capillary Latest Ref Range: 70 - 99 mg/dL 359 (H)  15 units NOVOLOG +  40 units LEVEMIR  203 (H)  5 units NOVOLOG      Admit Gangrenous L great toe--Needs Amputation  History: DM2, PAD   Home DM Meds: Levemir 50 units BID      Jardiance 10 mg Daily      Glipizide 5 mg Daily      Victoza 1.8 mg QHS   Current Orders: Levemir 40 units BID       Novolog Moderate Correction Scale/ SSI (0-15 units) Q6 hours    MD- Please consider the following in-hospital insulin adjustments after surgery today:  1. Increase Levemir to 45 units BID  2. Change Novolog SSI timing to TID AC + HS      --Will follow patient during hospitalization--  Wyn Quaker RN, MSN, CDE Diabetes Coordinator Inpatient Glycemic Control Team Team Pager: [phone] (8a-5p)

## 2019-05-29 NOTE — Anesthesia Post-op Follow-up Note (Signed)
Anesthesia QCDR form completed.        

## 2019-05-30 ENCOUNTER — Encounter: Payer: Self-pay | Admitting: Podiatry

## 2019-05-30 LAB — SURGICAL PATHOLOGY

## 2019-06-03 LAB — AEROBIC/ANAEROBIC CULTURE W GRAM STAIN (SURGICAL/DEEP WOUND)

## 2019-06-26 ENCOUNTER — Other Ambulatory Visit (INDEPENDENT_AMBULATORY_CARE_PROVIDER_SITE_OTHER): Payer: Self-pay | Admitting: Vascular Surgery

## 2019-06-26 DIAGNOSIS — Z95820 Peripheral vascular angioplasty status with implants and grafts: Secondary | ICD-10-CM

## 2019-06-26 DIAGNOSIS — I70262 Atherosclerosis of native arteries of extremities with gangrene, left leg: Secondary | ICD-10-CM

## 2019-06-29 ENCOUNTER — Ambulatory Visit (INDEPENDENT_AMBULATORY_CARE_PROVIDER_SITE_OTHER): Payer: Medicare Other

## 2019-06-29 ENCOUNTER — Encounter (INDEPENDENT_AMBULATORY_CARE_PROVIDER_SITE_OTHER): Payer: Self-pay | Admitting: Nurse Practitioner

## 2019-06-29 ENCOUNTER — Other Ambulatory Visit: Payer: Self-pay

## 2019-06-29 ENCOUNTER — Ambulatory Visit (INDEPENDENT_AMBULATORY_CARE_PROVIDER_SITE_OTHER): Payer: Medicare Other | Admitting: Nurse Practitioner

## 2019-06-29 VITALS — BP 85/54 | HR 99 | Resp 16 | Wt 198.4 lb

## 2019-06-29 DIAGNOSIS — Z95820 Peripheral vascular angioplasty status with implants and grafts: Secondary | ICD-10-CM | POA: Diagnosis not present

## 2019-06-29 DIAGNOSIS — I70262 Atherosclerosis of native arteries of extremities with gangrene, left leg: Secondary | ICD-10-CM | POA: Diagnosis not present

## 2019-06-29 DIAGNOSIS — Z794 Long term (current) use of insulin: Secondary | ICD-10-CM | POA: Diagnosis not present

## 2019-06-29 DIAGNOSIS — E1152 Type 2 diabetes mellitus with diabetic peripheral angiopathy with gangrene: Secondary | ICD-10-CM | POA: Diagnosis not present

## 2019-06-29 DIAGNOSIS — I739 Peripheral vascular disease, unspecified: Secondary | ICD-10-CM | POA: Diagnosis not present

## 2019-06-29 DIAGNOSIS — I959 Hypotension, unspecified: Secondary | ICD-10-CM | POA: Diagnosis not present

## 2019-07-04 ENCOUNTER — Encounter (INDEPENDENT_AMBULATORY_CARE_PROVIDER_SITE_OTHER): Payer: Self-pay | Admitting: Nurse Practitioner
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
## 2019-08-02 ENCOUNTER — Ambulatory Visit (INDEPENDENT_AMBULATORY_CARE_PROVIDER_SITE_OTHER): Payer: Medicare Other | Admitting: Physician Assistant

## 2019-08-02 ENCOUNTER — Encounter: Payer: Self-pay | Admitting: Physician Assistant

## 2019-08-02 ENCOUNTER — Other Ambulatory Visit: Payer: Self-pay

## 2019-08-02 VITALS — BP 110/70 | HR 95 | Ht 65.0 in | Wt 197.5 lb

## 2019-08-02 DIAGNOSIS — I951 Orthostatic hypotension: Secondary | ICD-10-CM

## 2019-08-02 DIAGNOSIS — E785 Hyperlipidemia, unspecified: Secondary | ICD-10-CM | POA: Diagnosis not present

## 2019-08-02 DIAGNOSIS — E1152 Type 2 diabetes mellitus with diabetic peripheral angiopathy with gangrene: Secondary | ICD-10-CM

## 2019-08-02 DIAGNOSIS — F17200 Nicotine dependence, unspecified, uncomplicated: Secondary | ICD-10-CM | POA: Diagnosis not present

## 2019-08-02 DIAGNOSIS — Z794 Long term (current) use of insulin: Secondary | ICD-10-CM

## 2019-08-02 NOTE — Patient Instructions (Signed)
Medication Instructions:  No changes  *If you need a refill on your cardiac medications before your next appointment, please call your pharmacy*  Lab Work: Lipid panel. Go to [facility] to have labs done. Nothing to eat or drink after midnight before except water with pills.   If you have labs (blood work) drawn today and your tests are completely normal, you will receive your results only by: Marland Kitchen MyChart Message (if you have MyChart) OR . A paper copy in the mail If you have any lab test that is abnormal or we need to change your treatment, we will call you to review the results.  Testing/Procedures: None  Follow-Up: At [facility], you and your health needs are our priority.  As part of our continuing mission to provide you with exceptional heart care, we have created designated Provider Care Teams.  These Care Teams include your primary Cardiologist (physician) and Advanced Practice Providers (APPs -  Physician Assistants and Nurse Practitioners) who all work together to provide you with the care you need, when you need it.  Your next appointment:   1 year(s)  The format for your next appointment:   In Person  Provider:    You may see Dr. Ida Rogue or one of the following Advanced Practice Providers on your designated Care Team:    Murray Hodgkins, NP  Christell Faith, PA-C  Marrianne Mood, PA-C
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
## 2019-08-03 ENCOUNTER — Other Ambulatory Visit
Admission: RE | Admit: 2019-08-03 | Discharge: 2019-08-03 | Disposition: A | Discharge status: Home / Self Care | Payer: Medicare Other | Source: Ambulatory Visit | Attending: Physician Assistant | Admitting: Physician Assistant

## 2019-08-03 ENCOUNTER — Telehealth: Payer: Self-pay | Admitting: *Deleted

## 2019-08-03 DIAGNOSIS — E785 Hyperlipidemia, unspecified: Secondary | ICD-10-CM | POA: Insufficient documentation

## 2019-08-03 DIAGNOSIS — E782 Mixed hyperlipidemia: Secondary | ICD-10-CM

## 2019-08-03 DIAGNOSIS — Z79899 Other long term (current) drug therapy: Secondary | ICD-10-CM

## 2019-08-03 LAB — LIPID PANEL
Cholesterol: 258 mg/dL — ABNORMAL HIGH (ref 0–200)
HDL: 28 mg/dL — ABNORMAL LOW (ref >40)
LDL Cholesterol: UNDETERMINED mg/dL (ref 0–99)
Total CHOL/HDL Ratio: 9.2 RATIO
Triglycerides: 426 mg/dL — ABNORMAL HIGH (ref <150)
VLDL: UNDETERMINED mg/dL (ref 0–40)

## 2019-08-03 LAB — LDL CHOLESTEROL, DIRECT: Direct LDL: 151.2 mg/dL — ABNORMAL HIGH (ref 0–99)

## 2019-08-03 MED ORDER — EZETIMIBE 10 MG PO TABS: 10.0000 mg | ORAL_TABLET | Freq: Every day | ORAL | 3 refills | Status: DC

## 2019-08-03 NOTE — Telephone Encounter (Signed)
Results called to pt. Pt verbalized understanding of results and recommendations. She is willing to try the zetia. Rx sent to pharmacy. She is to f/u in 1 year. She asked if she needed to get repeat lab work before then. Routing to [employer], [place] for review.

## 2019-08-03 NOTE — Telephone Encounter (Signed)
-----   Message from Arvil Chaco, PA-C sent at 08/03/2019 12:40 PM EST ----- Please let Ms. Dicioccio know that the results of her follow-up lipid panel showed total cholesterol 258, triglycerides 426, HDL 28, and LDL unable to be calculated due to elevated triglycerides. Given her continued elevated cholesterol above 200 and continued elevated triglycerides with low HDL, would recommend that she trial Zetia 10mg  daily and if she does not tolerate this medication, consider PCSK9i. If she is amenable to starting this medication, let's start her on Zetia 10mg  daily.

## 2019-08-06 NOTE — Telephone Encounter (Signed)
Refer to LDL result note for plans for repeat labs.

## 2019-08-09 NOTE — Telephone Encounter (Signed)
Arvil Chaco, PA-C  08/05/2019 6:41 AM EST    Please let Ms. Morine know that her direct LDL is 151.2. She should continue her Zetia and let us know how she tolerates it. We will recheck her lipids and LFTs in 2-3 months on this medication.    Patient notified and verbalized understanding of plan of care. She knows she needs to be fasting and go to the [facility] in 2-3 months.

## 2019-08-22 ENCOUNTER — Other Ambulatory Visit: Payer: Self-pay | Admitting: Family Medicine

## 2019-08-22 DIAGNOSIS — Z1231 Encounter for screening mammogram for malignant neoplasm of breast: Secondary | ICD-10-CM

## 2019-09-13 ENCOUNTER — Other Ambulatory Visit (INDEPENDENT_AMBULATORY_CARE_PROVIDER_SITE_OTHER): Payer: Self-pay | Admitting: Vascular Surgery

## 2019-09-13 DIAGNOSIS — I739 Peripheral vascular disease, unspecified: Secondary | ICD-10-CM

## 2019-09-17 ENCOUNTER — Ambulatory Visit (INDEPENDENT_AMBULATORY_CARE_PROVIDER_SITE_OTHER): Payer: Medicare Other

## 2019-09-17 ENCOUNTER — Ambulatory Visit (INDEPENDENT_AMBULATORY_CARE_PROVIDER_SITE_OTHER): Payer: Medicare Other | Admitting: Vascular Surgery

## 2019-09-17 ENCOUNTER — Encounter (INDEPENDENT_AMBULATORY_CARE_PROVIDER_SITE_OTHER): Payer: Self-pay | Admitting: Vascular Surgery

## 2019-09-17 ENCOUNTER — Other Ambulatory Visit: Payer: Self-pay

## 2019-09-17 VITALS — BP 86/54 | HR 102 | Resp 16 | Wt 196.0 lb

## 2019-09-17 DIAGNOSIS — E118 Type 2 diabetes mellitus with unspecified complications: Secondary | ICD-10-CM

## 2019-09-17 DIAGNOSIS — E782 Mixed hyperlipidemia: Secondary | ICD-10-CM | POA: Diagnosis not present

## 2019-09-17 DIAGNOSIS — I739 Peripheral vascular disease, unspecified: Secondary | ICD-10-CM

## 2019-09-17 DIAGNOSIS — I70211 Atherosclerosis of native arteries of extremities with intermittent claudication, right leg: Secondary | ICD-10-CM | POA: Diagnosis not present

## 2019-09-19 ENCOUNTER — Encounter (INDEPENDENT_AMBULATORY_CARE_PROVIDER_SITE_OTHER): Payer: Self-pay | Admitting: Vascular Surgery

## 2019-09-19 ENCOUNTER — Ambulatory Visit
Admission: RE | Admit: 2019-09-19 | Discharge: 2019-09-19 | Disposition: A | Discharge status: Home / Self Care | Payer: Medicare Other | Source: Ambulatory Visit | Attending: Family Medicine | Admitting: Family Medicine

## 2019-09-19 DIAGNOSIS — Z1231 Encounter for screening mammogram for malignant neoplasm of breast: Secondary | ICD-10-CM
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
## 2019-10-02 ENCOUNTER — Encounter (INDEPENDENT_AMBULATORY_CARE_PROVIDER_SITE_OTHER): Payer: Medicare Other

## 2019-10-02 ENCOUNTER — Ambulatory Visit (INDEPENDENT_AMBULATORY_CARE_PROVIDER_SITE_OTHER): Payer: Medicare Other | Admitting: Vascular Surgery

## 2019-11-07 ENCOUNTER — Other Ambulatory Visit: Payer: Self-pay | Admitting: Podiatry

## 2019-11-07 NOTE — Addendum Note (Signed)
Addended by: Caroline More on: 11/07/2019 04:22 PM   Modules accepted: Orders

## 2019-11-08 ENCOUNTER — Other Ambulatory Visit
Admission: RE | Admit: 2019-11-08 | Discharge: 2019-11-08 | Disposition: A | Discharge status: Home / Self Care | Payer: Medicare Other | Source: Ambulatory Visit | Attending: Podiatry | Admitting: Podiatry

## 2019-11-08 ENCOUNTER — Other Ambulatory Visit: Payer: Self-pay

## 2019-11-08 DIAGNOSIS — Z01812 Encounter for preprocedural laboratory examination: Secondary | ICD-10-CM | POA: Diagnosis present

## 2019-11-08 DIAGNOSIS — Z20822 Contact with and (suspected) exposure to covid-19: Secondary | ICD-10-CM | POA: Diagnosis not present

## 2019-11-08 LAB — SARS CORONAVIRUS 2 (TAT 6-24 HRS): SARS Coronavirus 2: NEGATIVE

## 2019-11-09 ENCOUNTER — Ambulatory Visit: Payer: Medicare Other | Admitting: Anesthesiology

## 2019-11-09 ENCOUNTER — Other Ambulatory Visit: Payer: Self-pay

## 2019-11-09 ENCOUNTER — Encounter
Admission: RE | Disposition: A | Discharge status: Home / Self Care | Payer: Self-pay | Source: Home / Self Care | Attending: Podiatry

## 2019-11-09 ENCOUNTER — Encounter: Payer: Self-pay | Admitting: Podiatry

## 2019-11-09 ENCOUNTER — Ambulatory Visit
Admission: RE | Admit: 2019-11-09 | Discharge: 2019-11-09 | Disposition: A | Discharge status: Home / Self Care | Payer: Medicare Other | Attending: Podiatry | Admitting: Podiatry

## 2019-11-09 DIAGNOSIS — E118 Type 2 diabetes mellitus with unspecified complications: Secondary | ICD-10-CM

## 2019-11-09 DIAGNOSIS — Z7982 Long term (current) use of aspirin: Secondary | ICD-10-CM | POA: Insufficient documentation

## 2019-11-09 DIAGNOSIS — M869 Osteomyelitis, unspecified: Secondary | ICD-10-CM | POA: Insufficient documentation

## 2019-11-09 DIAGNOSIS — F17210 Nicotine dependence, cigarettes, uncomplicated: Secondary | ICD-10-CM | POA: Diagnosis not present

## 2019-11-09 DIAGNOSIS — L97524 Non-pressure chronic ulcer of other part of left foot with necrosis of bone: Secondary | ICD-10-CM | POA: Diagnosis not present

## 2019-11-09 DIAGNOSIS — E1142 Type 2 diabetes mellitus with diabetic polyneuropathy: Secondary | ICD-10-CM | POA: Insufficient documentation

## 2019-11-09 DIAGNOSIS — E785 Hyperlipidemia, unspecified: Secondary | ICD-10-CM | POA: Diagnosis not present

## 2019-11-09 DIAGNOSIS — Z79899 Other long term (current) drug therapy: Secondary | ICD-10-CM | POA: Insufficient documentation

## 2019-11-09 DIAGNOSIS — F419 Anxiety disorder, unspecified: Secondary | ICD-10-CM | POA: Insufficient documentation

## 2019-11-09 DIAGNOSIS — Z8673 Personal history of transient ischemic attack (TIA), and cerebral infarction without residual deficits: Secondary | ICD-10-CM | POA: Diagnosis not present

## 2019-11-09 DIAGNOSIS — E11621 Type 2 diabetes mellitus with foot ulcer: Secondary | ICD-10-CM | POA: Diagnosis not present

## 2019-11-09 DIAGNOSIS — Z794 Long term (current) use of insulin: Secondary | ICD-10-CM | POA: Insufficient documentation

## 2019-11-09 HISTORY — PX: AMPUTATION TOE: SHX6595

## 2019-11-09 LAB — GLUCOSE, CAPILLARY
Glucose-Capillary: 173 mg/dL — ABNORMAL HIGH (ref 70–99)
Glucose-Capillary: 138 mg/dL — ABNORMAL HIGH (ref 70–99)

## 2019-11-09 SURGERY — AMPUTATION, TOE
Anesthesia: General | Site: Toe | Laterality: Left

## 2019-11-09 MED ORDER — NEOMYCIN-POLYMYXIN B GU 40-200000 IR SOLN
Status: DC | PRN
  Administered 2019-11-09: 2 mL

## 2019-11-09 MED ORDER — FENTANYL CITRATE (PF) 100 MCG/2ML IJ SOLN
INTRAMUSCULAR | Status: AC
  Filled 2019-11-09: qty 2

## 2019-11-09 MED ORDER — ASPIRIN EC 325 MG PO TBEC: 325.0000 mg | DELAYED_RELEASE_TABLET | Freq: Every day | ORAL | 0 refills | Status: DC

## 2019-11-09 MED ORDER — PROPOFOL 10 MG/ML IV BOLUS
INTRAVENOUS | Status: DC | PRN
  Administered 2019-11-09: 20 mg via INTRAVENOUS
  Administered 2019-11-09: 30 mg via INTRAVENOUS

## 2019-11-09 MED ORDER — CLINDAMYCIN PHOSPHATE 900 MG/50ML IV SOLN
900.0000 mg | INTRAVENOUS | Status: AC
  Administered 2019-11-09: 900 mg via INTRAVENOUS

## 2019-11-09 MED ORDER — DOXYCYCLINE HYCLATE 50 MG PO CAPS
50.0000 mg | ORAL_CAPSULE | Freq: Two times a day (BID) | ORAL | 0 refills | Status: AC
Start: 2019-11-09 — End: 2019-11-16

## 2019-11-09 MED ORDER — BUPIVACAINE HCL 0.5 % IJ SOLN
INTRAMUSCULAR | Status: DC | PRN
  Administered 2019-11-09: 10 mL

## 2019-11-09 MED ORDER — ONDANSETRON HCL 4 MG/2ML IJ SOLN
INTRAMUSCULAR | Status: DC | PRN
  Administered 2019-11-09: 4 mg via INTRAVENOUS

## 2019-11-09 MED ORDER — PROPOFOL 500 MG/50ML IV EMUL
INTRAVENOUS | Status: AC
  Filled 2019-11-09: qty 50

## 2019-11-09 MED ORDER — MIDAZOLAM HCL 2 MG/2ML IJ SOLN
INTRAMUSCULAR | Status: AC
  Filled 2019-11-09: qty 2

## 2019-11-09 MED ORDER — ONDANSETRON HCL 4 MG PO TABS: 4.0000 mg | ORAL_TABLET | Freq: Three times a day (TID) | ORAL | 0 refills | Status: AC | PRN

## 2019-11-09 MED ORDER — ASPIRIN EC 81 MG PO TBEC: 81.0000 mg | DELAYED_RELEASE_TABLET | Freq: Every day | ORAL | 2 refills | Status: DC

## 2019-11-09 MED ORDER — CLINDAMYCIN PHOSPHATE 900 MG/50ML IV SOLN
INTRAVENOUS | Status: AC
  Filled 2019-11-09: qty 50

## 2019-11-09 MED ORDER — SODIUM CHLORIDE 0.9 % IV SOLN
INTRAVENOUS | Status: DC
  Administered 2019-11-09: 12:00:00 50 mL/h via INTRAVENOUS

## 2019-11-09 MED ORDER — PROPOFOL 500 MG/50ML IV EMUL
INTRAVENOUS | Status: DC | PRN
  Administered 2019-11-09: 50 ug/kg/min via INTRAVENOUS

## 2019-11-09 MED ORDER — POVIDONE-IODINE 7.5 % EX SOLN
Freq: Once | CUTANEOUS | Status: DC
  Filled 2019-11-09: qty 118

## 2019-11-09 MED ORDER — MIDAZOLAM HCL 2 MG/2ML IJ SOLN
INTRAMUSCULAR | Status: DC | PRN
  Administered 2019-11-09: 5 mg via INTRAVENOUS

## 2019-11-09 MED ORDER — FENTANYL CITRATE (PF) 100 MCG/2ML IJ SOLN: 25.0000 ug | INTRAMUSCULAR | Status: DC | PRN

## 2019-11-09 MED ORDER — ACETAMINOPHEN 10 MG/ML IV SOLN: 1000.0000 mg | Freq: Once | INTRAVENOUS | Status: DC | PRN

## 2019-11-09 MED ORDER — ONDANSETRON HCL 4 MG/2ML IJ SOLN: 4.0000 mg | Freq: Once | INTRAMUSCULAR | Status: DC | PRN

## 2019-11-09 MED ORDER — TRAMADOL HCL 50 MG PO TABS: 50.0000 mg | ORAL_TABLET | Freq: Four times a day (QID) | ORAL | 0 refills | Status: AC | PRN

## 2019-11-09 SURGICAL SUPPLY — 47 items
BLADE MED AGGRESSIVE (BLADE) ×1 IMPLANT
BLADE OSC/SAGITTAL MD 5.5X18 (BLADE) ×2 IMPLANT
BLADE SURG 15 STRL LF DISP TIS (BLADE) IMPLANT
BLADE SURG 15 STRL SS (BLADE)
BLADE SURG MINI STRL (BLADE) IMPLANT
BNDG CONFORM 2 STRL LF (GAUZE/BANDAGES/DRESSINGS) IMPLANT
BNDG ELASTIC 4X5.8 VLCR STR LF (GAUZE/BANDAGES/DRESSINGS) ×3 IMPLANT
BNDG ESMARK 4X12 TAN STRL LF (GAUZE/BANDAGES/DRESSINGS) ×3 IMPLANT
BNDG GAUZE 4.5X4.1 6PLY STRL (MISCELLANEOUS) ×3 IMPLANT
CANISTER SUCT 1200ML W/VALVE (MISCELLANEOUS) ×3 IMPLANT
CNTNR SPEC 2.5X3XGRAD LEK (MISCELLANEOUS) ×1
CONT SPEC 4OZ STER OR WHT (MISCELLANEOUS) ×2
CONTAINER SPEC 2.5X3XGRAD LEK (MISCELLANEOUS) ×1 IMPLANT
COVER WAND RF STERILE (DRAPES) ×3 IMPLANT
CUFF TOURN 18 STER (MISCELLANEOUS) ×2 IMPLANT
CUFF TOURN DUAL PL 12 NO SLV (MISCELLANEOUS) IMPLANT
DRAPE FLUOR MINI C-ARM 54X84 (DRAPES) IMPLANT
DURAPREP 26ML APPLICATOR (WOUND CARE) ×3 IMPLANT
ELECT REM PT RETURN 9FT ADLT (ELECTROSURGICAL) ×3
ELECTRODE REM PT RTRN 9FT ADLT (ELECTROSURGICAL) ×1 IMPLANT
GAUZE SPONGE 4X4 12PLY STRL (GAUZE/BANDAGES/DRESSINGS) ×6 IMPLANT
GAUZE XEROFORM 1X8 LF (GAUZE/BANDAGES/DRESSINGS) ×3 IMPLANT
GLOVE BIO SURGEON STRL SZ7 (GLOVE) ×3 IMPLANT
GLOVE INDICATOR 7.0 STRL GRN (GLOVE) ×3 IMPLANT
GOWN STRL REUS W/ TWL LRG LVL3 (GOWN DISPOSABLE) ×2 IMPLANT
GOWN STRL REUS W/TWL LRG LVL3 (GOWN DISPOSABLE) ×4
HANDPIECE VERSAJET DEBRIDEMENT (MISCELLANEOUS) IMPLANT
KIT TURNOVER KIT A (KITS) ×3 IMPLANT
LABEL OR SOLS (LABEL) ×3 IMPLANT
NDL FILTER BLUNT 18X1 1/2 (NEEDLE) ×1 IMPLANT
NDL HYPO 25X1 1.5 SAFETY (NEEDLE) ×2 IMPLANT
NEEDLE FILTER BLUNT 18X 1/2SAF (NEEDLE) ×2
NEEDLE FILTER BLUNT 18X1 1/2 (NEEDLE) ×1 IMPLANT
NEEDLE HYPO 25X1 1.5 SAFETY (NEEDLE) ×6 IMPLANT
NS IRRIG 500ML POUR BTL (IV SOLUTION) ×3 IMPLANT
PACK EXTREMITY (MISCELLANEOUS) ×3 IMPLANT
SOL .9 NS 3000ML IRR  AL (IV SOLUTION)
SOL .9 NS 3000ML IRR UROMATIC (IV SOLUTION) IMPLANT
SOL PREP PVP 2OZ (MISCELLANEOUS) ×3
SOLUTION PREP PVP 2OZ (MISCELLANEOUS) ×1 IMPLANT
STOCKINETTE STRL 6IN 960660 (GAUZE/BANDAGES/DRESSINGS) ×3 IMPLANT
SUT ETHILON 3-0 FS-10 30 BLK (SUTURE) ×6
SUT VIC AB 3-0 SH 27 (SUTURE) ×2
SUT VIC AB 3-0 SH 27X BRD (SUTURE) ×1 IMPLANT
SUTURE EHLN 3-0 FS-10 30 BLK (SUTURE) ×2 IMPLANT
SWAB CULTURE AMIES ANAERIB BLU (MISCELLANEOUS) IMPLANT
SYR 10ML LL (SYRINGE) ×3 IMPLANT

## 2019-11-09 NOTE — Transfer of Care (Signed)
Immediate Anesthesia Transfer of Care Note  Patient: Donna Fisher  Procedure(s) Performed: left  second toe partial amputation (Left Toe)  Patient Location: PACU  Anesthesia Type:General  Level of Consciousness: sedated  Airway & Oxygen Therapy: Patient Spontanous Breathing and Patient connected to face mask oxygen  Post-op Assessment: Report given to RN and Post -op Vital signs reviewed and stable  Post vital signs: Reviewed and stable  Last Vitals:  Vitals Value Taken Time  BP 120/79 11/09/19 1329  Temp    Pulse 99 11/09/19 1330  Resp 31 11/09/19 1330  SpO2 100 % 11/09/19 1330  Vitals shown include unvalidated device data.  Last Pain:  Vitals:   11/09/19 1135  TempSrc: Tympanic  PainSc: 4          Complications: No apparent anesthesia complications

## 2019-11-09 NOTE — H&P (Signed)
HISTORY AND PHYSICAL INTERVAL NOTE:  11/09/2019  12:22 PM  [place]  has presented today for surgery, with the diagnosis of E11.621, L97.524  DIABETIC ULCER TOE LEFT FOOT K74.259 OSTEOMYELITIS LEFT FOOT.  The various methods of treatment have been discussed with the patient.  No guarantees were given.  After consideration of risks, benefits and other options for treatment, the patient has consented to surgery.  I have reviewed the patients' chart and labs.    PROCEDURE: LEFT 2ND TOE PARTIAL AMPUTATION   A history and physical examination was performed in my office.  The patient was reexamined.  There have been no changes to this history and physical examination.  Caroline More, DPM

## 2019-11-09 NOTE — Anesthesia Postprocedure Evaluation (Signed)
Anesthesia Post Note  Patient: Donna Fisher  Procedure(s) Performed: left  second toe partial amputation (Left Toe)  Patient location during evaluation: PACU Anesthesia Type: General Level of consciousness: awake and alert Pain management: pain level controlled Vital Signs Assessment: post-procedure vital signs reviewed and stable Respiratory status: spontaneous breathing, nonlabored ventilation, respiratory function stable and patient connected to nasal cannula oxygen Cardiovascular status: blood pressure returned to baseline and stable Postop Assessment: no apparent nausea or vomiting Anesthetic complications: no     Last Vitals:  Vitals:   11/09/19 1407 11/09/19 1429  BP: 136/61 (!) 142/77  Pulse: 100 98  Resp: 16 16  Temp: (!) 36.2 C   SpO2: 99% 100%    Last Pain:  Vitals:   11/09/19 1429  TempSrc:   PainSc: 0-No pain                 Arita Miss

## 2019-11-09 NOTE — Discharge Instructions (Addendum)
[place] DR. TROXLER, DR. Vickki Muff, AND DR. Hawthorne                                                                                    PARTIAL WEIGHT BEARING IS 50% WEIGHT ONLY  1. Take your medication as prescribed.  Pain medication should be taken only as needed.  2. Keep the dressing clean, dry and intact.  Remain partial weightbearing with heel contact only in a surgical shoe and try to stay off of the left foot as much possible.  3. Keep your foot elevated above the heart level for the first 48 hours.  4. Walking to the bathroom and brief periods of walking are acceptable, unless we have instructed you to be non-weight bearing.  5. Always wear your post-op shoe when walking.  Always use your crutches if you are to be non-weight bearing.  6. Do not take a shower. Baths are permissible as long as the foot is kept out of the water.   7. Every hour you are awake:  - Bend your knee 15 times. - Flex foot 15 times - Massage calf 15 times  8. Call [facility] [phone]) if any of the following problems occur: - You develop a temperature or fever. - The bandage becomes saturated with blood. - Medication does not stop your pain. - Injury of the foot occurs. - Any symptoms of infection including redness, odor, or red streaks running from wound.     AMBULATORY SURGERY  DISCHARGE INSTRUCTIONS   1) The drugs that you were given will stay in your system until tomorrow so for the next 24 hours you should not:  A) Drive an automobile B) Make any legal decisions C) Drink any alcoholic beverage   2) You may resume regular meals tomorrow.  Today it is better to start with liquids and gradually work up to solid foods.  You may eat anything you prefer, but it is better to start with liquids, then soup and crackers, and gradually work up to solid foods.   3) Please  notify your doctor immediately if you have any unusual bleeding, trouble breathing, redness and pain at the surgery site, drainage, fever, or pain not relieved by medication. 4)   5) Your post-operative visit with Dr.                                     is: Date:                        Time:    Please call to schedule your post-operative visit.  6) Additional Instructions:

## 2019-11-09 NOTE — Op Note (Addendum)
PODIATRY / FOOT AND ANKLE SURGERY OPERATIVE REPORT    SURGEON: Caroline More, DPM  PRE-OPERATIVE DIAGNOSIS:  1.  Left second toe osteomyelitis 2.  Left second toe distal tip ulceration with associated cellulitis with necrosis of bone. 3.  Diabetes type 2 with polyneuropathy  POST-OPERATIVE DIAGNOSIS: Same  PROCEDURE(S): 1. Left second toe partial amputation  HEMOSTASIS: None  ANESTHESIA: MAC  ESTIMATED BLOOD LOSS: 25cc  FINDING(S): 1.  Left second toe distal phalanx DIPJ osteomyelitis and associated cellulitis  PATHOLOGY/SPECIMEN(S): Left second toe with proximal margin marked in purple ink  INDICATIONS:   Donna Fisher is a 54 y.o. female who presents with an ulceration to the distal tip of the left second toe with associated cellulitis.  The wound on examination probes to bone.  Dr. Elvina Mattes saw the patient earlier this week along with myself.  X-rays were taken showing osteomyelitic changes to the DIPJ level of the left second toe with associated hammertoe contracture.  Patient has been placed on doxycycline medication and a bone culture has been taken and sent off.  Discussed all treatment options the patient both conservative and surgical attempts at correction include potential risks and complications of surgical intervention at this time patient is elected for surgery consisting of left second toe partial amputation.   DESCRIPTION: After obtaining full informed written consent, the patient was brought back to the operating room and placed supine upon the operating table.  The patient received IV antibiotics prior to induction.  10 cc of half percent Marcaine plain was direct about the second ray.  After obtaining adequate anesthesia, the patient was prepped and draped in the standard fashion.  No tourniquet was applied during the procedure.  Attention was then directed to the left second digit over the level of the PIPJ where a fishmouth incision was made creating dorsal and  plantar full-thickness flaps.  At this time an extensor tenotomy and capsulotomy was performed followed by release the collateral ligaments and release of the flexor tendon.  The middle and distal phalanx were then disarticulated and passed off the operative site.  Attention was then directed to the head of the proximal phalanx which was circumferentially dissected to the level of the distal shaft and a sagittal bone saw was used to resect the head of the proximal phalanx at the operative site was passed off.  The extensor tendon and flexor tendons were cut as far proximally as possible.  Bovie cauterization was performed to any bleeding vessels.  Hemostasis was well achieved.  The head of the proximal phalanx was marked in purple ink at the proximal margin and the second toe that was resected was passed off in the operative site and sent for pathology.  The surgical site was flushed with copious amounts normal sterile saline.  The skin was then reapproximated well coapted with 3-0 nylon in simple type stitching.  The patient tolerated the procedure and anesthesia well.  A postoperative dressing was applied consisting of Xeroform followed by 4 x 4 gauze, Kerlix, Ace wrap.  The patient tolerated the procedure and anesthesia well was transferred to recovery room vital signs stable vascular status intact to all toes left foot.  Following.  Postoperative monitoring the patient be discharged home the following written oral postop instructions: Keep surgical dressings clean, dry, and intact, ice and elevate left lower extremity when at rest, take postoperative pain medication, antibiotics, and antinausea medicine as prescribed, wear surgical shoe at all times when ambulating and try to stay off the  left foot is much as possible and ambulate with heel contact only.  COMPLICATIONS: None  CONDITION: Good, stable  Caroline More, DPM

## 2019-11-09 NOTE — Anesthesia Preprocedure Evaluation (Signed)
Anesthesia Evaluation  Patient identified by MRN, date of birth, ID band Patient awake    Reviewed: Allergy & Precautions, NPO status , Patient's Chart, lab work & pertinent test results  History of Anesthesia Complications Negative for: history of anesthetic complications  Airway Mallampati: II  TM Distance: >3 FB Neck ROM: Full   Comment: Prior grade 3 view during last intubation (first attempt successful) Dental  (+) Edentulous Upper, Poor Dentition, Loose, Dental Advisory Given,    Pulmonary neg pulmonary ROS, neg sleep apnea, neg COPD, Current Smoker and Patient abstained from smoking.,    Pulmonary exam normal breath sounds clear to auscultation       Cardiovascular Exercise Tolerance: Good METS(-) hypertension+ Peripheral Vascular Disease  (-) CAD and (-) Past MI (-) dysrhythmias  Rhythm:Regular Rate:Normal - Systolic murmurs    Neuro/Psych  Headaches, PSYCHIATRIC DISORDERS Anxiety "mini stroke " in 2015, no residual symptoms CVA, No Residual Symptoms    GI/Hepatic neg GERD  ,(+)     (-) substance abuse  ,   Endo/Other  diabetes, Insulin Dependent  Renal/GU negative Renal ROS     Musculoskeletal   Abdominal   Peds  Hematology   Anesthesia Other Findings Past Medical History: No date: Anxiety state, unspecified No date: Diabetic neuropathy ([place]) No date: Genital herpes, unspecified No date: Headache(784.0) No date: Mini stroke ([place])     Comment:  Hx No date: Other and unspecified hyperlipidemia No date: Pain in joint, pelvic region and thigh No date: 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 of complication, uncontrolled No date: Urinary tract infection, site not specified No date: Vaginitis and vulvovaginitis, unspecified No date: Vaginitis and vulvovaginitis, unspecified  Reproductive/Obstetrics                             Anesthesia  Physical Anesthesia Plan  ASA: III  Anesthesia Plan: General   Post-op Pain Management:    Induction: Intravenous  PONV Risk Score and Plan: 2 and Ondansetron, Propofol infusion and TIVA  Airway Management Planned: Nasal Cannula  Additional Equipment: None  Intra-op Plan:   Post-operative Plan:   Informed Consent: I have reviewed the patients History and Physical, chart, labs and discussed the procedure including the risks, benefits and alternatives for the proposed anesthesia with the patient or authorized representative who has indicated his/her understanding and acceptance.     Dental advisory given  Plan Discussed with: CRNA and Surgeon  Anesthesia Plan Comments: (Discussed risks of anesthesia with patient, including possibility of difficulty with spontaneous ventilation under anesthesia necessitating airway intervention, PONV, and rare risks such as cardiac or respiratory or neurological events. Patient understands.)        Anesthesia Quick Evaluation

## 2019-11-13 LAB — SURGICAL PATHOLOGY

## 2019-12-24 ENCOUNTER — Encounter: Payer: Self-pay | Admitting: Certified Nurse Midwife

## 2019-12-24 ENCOUNTER — Ambulatory Visit (INDEPENDENT_AMBULATORY_CARE_PROVIDER_SITE_OTHER): Payer: Medicare Other | Admitting: Certified Nurse Midwife

## 2019-12-24 ENCOUNTER — Other Ambulatory Visit: Payer: Self-pay

## 2019-12-24 VITALS — BP 103/61 | HR 96 | Ht 65.0 in | Wt 195.1 lb

## 2019-12-24 DIAGNOSIS — I70211 Atherosclerosis of native arteries of extremities with intermittent claudication, right leg: Secondary | ICD-10-CM

## 2019-12-24 DIAGNOSIS — N3946 Mixed incontinence: Secondary | ICD-10-CM | POA: Diagnosis not present

## 2019-12-24 LAB — POCT URINALYSIS DIPSTICK
Bilirubin, UA: NEGATIVE
Blood, UA: NEGATIVE
Glucose, UA: POSITIVE — AB
Ketones, UA: NEGATIVE
Leukocytes, UA: NEGATIVE
Nitrite, UA: NEGATIVE
Protein, UA: NEGATIVE
Spec Grav, UA: 1.025 (ref 1.010–1.025)
Urobilinogen, UA: 0.2 E.U./dL
pH, UA: 5 (ref 5.0–8.0)

## 2019-12-24 MED ORDER — TOLTERODINE TARTRATE ER 4 MG PO CP24: 4.0000 mg | ORAL_CAPSULE | Freq: Every day | ORAL | 0 refills | Status: DC

## 2019-12-24 NOTE — Progress Notes (Signed)
I have seen, interviewed, and examined the patient in conjunction with the [facility] Nurse Practitioner student and affirm the diagnosis and management plan.   Diona Fanti, CNM [facility], [facility] 12/24/19 3:22 PM

## 2019-12-24 NOTE — Patient Instructions (Addendum)
Tolterodine extended-release capsules What is this medicine? TOLTERODINE (tole TER a deen) is used to treat overactive bladder. This medicine reduces the amount of bathroom visits. It may also help to control wetting accidents. This medicine may be used for other purposes; ask your health care provider or pharmacist if you have questions. COMMON BRAND NAME(S): Detrol LA What should I tell my health care provider before I take this medicine? They need to know if you have any of these conditions:  difficulty passing urine  glaucoma  intestinal obstruction  irregular heartbeat or you have a family member with irregular heartbeat  kidney disease  liver disease  myasthenia gravis  an unusual or allergic reaction to tolterodine, fesoterodine, other medicines, foods, dyes, or preservatives  pregnant or trying to get pregnant  breast-feeding How should I use this medicine? Take this medicine by mouth with a glass of water. Swallow whole, do not crush, cut, or chew. Follow the directions on the prescription label. Take your doses at regular intervals. Do not take your medicine more often than directed. Talk to your pediatrician regarding the use of this medicine in children. Special care may be needed. Overdosage: If you think you have taken too much of this medicine contact a poison control center or emergency room at once. NOTE: This medicine is only for you. Do not share this medicine with others. What if I miss a dose? If you miss a dose, take it as soon as you can. If it is almost time for your next dose, take only that dose. Do not take double or extra doses. What may interact with this medicine?  clarithromycin  cyclosporine  erythromycin  fluoxetine  medicines for fungal infections, like fluconazole, itraconazole, ketoconazole or voriconazole  medicines for memory problems like galantamine, donepezil, tacrine  vinblastine This list may not describe all possible  interactions. Give your health care provider a list of all the medicines, herbs, non-prescription drugs, or dietary supplements you use. Also tell them if you smoke, drink alcohol, or use illegal drugs. Some items may interact with your medicine. What should I watch for while using this medicine? It may take 2 or 3 months to notice the full benefit from this medicine. Your health care professional may also recommend techniques that may help improve control of your bladder and sphincter muscles. These techniques will help you need the bathroom less frequently. You may need to limit your intake tea, coffee, caffeinated sodas, and alcohol. These drinks may make your symptoms worse. Keeping healthy bowel habits may lessen bladder symptoms. If you currently smoke, quitting smoking may help reduce irritation to the bladder muscle. You may get drowsy or dizzy. Do not drive, use machinery, or do anything that needs mental alertness until you know how this drug affects you. Do not stand or sit up quickly, especially if you are an older patient. This reduces the risk of dizzy or fainting spells. Your mouth may get dry. Chewing sugarless gum or sucking hard candy, and drinking plenty of water, will help. This medicine may cause dry eyes and blurred vision. If you wear contact lenses you may feel some discomfort. Lubricating drops may help. See your eye doctor if the problem does not go away or is severe. What side effects may I notice from receiving this medicine? Side effects that you should report to your doctor or health care professional as soon as possible:  allergic reactions like skin rash, itching or hives, swelling of the face, lips, or tongue  breathing problems  confusion  difficulty passing urine  fast, irregular heartbeat  hallucinations  memory problems  swelling in feet, hands Side effects that usually do not require medical attention (report to your doctor or health care professional if  they continue or are bothersome):  changes in vision  constipation  dry eyes, mouth  headache  dizziness, drowsiness  stomach upset This list may not describe all possible side effects. Call your doctor for medical advice about side effects. You may report side effects to FDA at 1-800-FDA-1088. Where should I keep my medicine? Keep out of the reach of children. Store at room temperature between 15 and 30 degrees C (59 and 86 degrees F). Protect from light. Throw away any unused medicine after the expiration date. NOTE: This sheet is a summary. It may not cover all possible information. If you have questions about this medicine, talk to your doctor, pharmacist, or health care provider.  2020 Elsevier/Gold Standard (2010-04-14 17:20:26)   Kegel Exercises  Kegel exercises can help strengthen your pelvic floor muscles. The pelvic floor is a group of muscles that support your rectum, small intestine, and bladder. In females, pelvic floor muscles also help support the womb (uterus). These muscles help you control the flow of urine and stool. Kegel exercises are painless and simple, and they do not require any equipment. Your provider may suggest Kegel exercises to:  Improve bladder and bowel control.  Improve sexual response.  Improve weak pelvic floor muscles after surgery to remove the uterus (hysterectomy) or pregnancy (females).  Improve weak pelvic floor muscles after prostate gland removal or surgery (males). Kegel exercises involve squeezing your pelvic floor muscles, which are the same muscles you squeeze when you try to stop the flow of urine or keep from passing gas. The exercises can be done while sitting, standing, or lying down, but it is best to vary your position. Exercises How to do Kegel exercises: 1. Squeeze your pelvic floor muscles tight. You should feel a tight lift in your rectal area. If you are a female, you should also feel a tightness in your vaginal area.  Keep your stomach, buttocks, and legs relaxed. 2. Hold the muscles tight for up to 10 seconds. 3. Breathe normally. 4. Relax your muscles. 5. Repeat as told by your health care provider. Repeat this exercise daily as told by your health care provider. Continue to do this exercise for at least 4-6 weeks, or for as long as told by your health care provider. You may be referred to a physical therapist who can help you learn more about how to do Kegel exercises. Depending on your condition, your health care provider may recommend:  Varying how long you squeeze your muscles.  Doing several sets of exercises every day.  Doing exercises for several weeks.  Making Kegel exercises a part of your regular exercise routine. This information is not intended to replace advice given to you by your health care provider. Make sure you discuss any questions you have with your health care provider. Document Revised: 02/22/2018 Document Reviewed: 02/22/2018 Elsevier Patient Education  [place].   Overactive Bladder, Adult  Overactive bladder refers to a condition in which a person has a sudden need to pass urine. The person may leak urine if he or she cannot get to the bathroom fast enough (urinary incontinence). A person with this condition may also wake up several times in the night to go to the bathroom. Overactive bladder is associated with poor  nerve signals between your bladder and your brain. Your bladder may get the signal to empty before it is full. You may also have very sensitive muscles that make your bladder squeeze too soon. These symptoms might interfere with daily work or social activities. What are the causes? This condition may be associated with or caused by:  Urinary tract infection.  Infection of nearby tissues, such as the prostate.  Prostate enlargement.  Surgery on the uterus or urethra.  Bladder stones, inflammation, or tumors.  Drinking too much caffeine or  alcohol.  Certain medicines, especially medicines that get rid of extra fluid in the body (diuretics).  Muscle or nerve weakness, especially from: ? A spinal cord injury. ? Stroke. ? Multiple sclerosis. ? Parkinson's disease.  Diabetes.  Constipation. What increases the risk? You may be at greater risk for overactive bladder if you:  Are an older adult.  Smoke.  Are going through menopause.  Have prostate problems.  Have a neurological disease, such as stroke, dementia, Parkinson's disease, or multiple sclerosis (MS).  Eat or drink things that irritate the bladder. These include alcohol, spicy food, and caffeine.  Are overweight or obese. What are the signs or symptoms? Symptoms of this condition include:  Sudden, strong urge to urinate.  Leaking urine.  Urinating 8 or more times a day.  Waking up to urinate 2 or more times a night. How is this diagnosed? Your health care provider may suspect overactive bladder based on your symptoms. He or she will diagnose this condition by:  A physical exam and medical history.  Blood or urine tests. You might need bladder or urine tests to help determine what is causing your overactive bladder. You might also need to see a health care provider who specializes in urinary tract problems (urologist). How is this treated? Treatment for overactive bladder depends on the cause of your condition and whether it is mild or severe. You can also make lifestyle changes at home. Options include:  Bladder training. This may include: ? Learning to control the urge to urinate by following a schedule that directs you to urinate at regular intervals (timed voiding). ? Doing Kegel exercises to strengthen your pelvic floor muscles, which support your bladder. Toning these muscles can help you control urination, even if your bladder muscles are overactive.  Special devices. This may include: ? Biofeedback, which uses sensors to help you become  aware of your body's signals. ? Electrical stimulation, which uses electrodes placed inside the body (implanted) or outside the body. These electrodes send gentle pulses of electricity to strengthen the nerves or muscles that control the bladder. ? Women may use a plastic device that fits into the vagina and supports the bladder (pessary).  Medicines. ? Antibiotics to treat bladder infection. ? Antispasmodics to stop the bladder from releasing urine at the wrong time. ? Tricyclic antidepressants to relax bladder muscles. ? Injections of botulinum toxin type A directly into the bladder tissue to relax bladder muscles.  Lifestyle changes. This may include: ? Weight loss. Talk to your health care provider about weight loss methods that would work best for you. ? Diet changes. This may include reducing how much alcohol and caffeine you consume, or drinking fluids at different times of the day. ? Not smoking. Do not use any products that contain nicotine or tobacco, such as cigarettes and e-cigarettes. If you need help quitting, ask your health care provider.  Surgery. ? A device may be implanted to help manage the nerve  signals that control urination. ? An electrode may be implanted to stimulate electrical signals in the bladder. ? A procedure may be done to change the shape of the bladder. This is done only in very severe cases. Follow these instructions at home: Lifestyle  Make any diet or lifestyle changes that are recommended by your health care provider. These may include: ? Drinking less fluid or drinking fluids at different times of the day. ? Cutting down on caffeine or alcohol. ? Doing Kegel exercises. ? Losing weight if needed. ? Eating a healthy and balanced diet to prevent constipation. This may include:  Eating foods that are high in fiber, such as fresh fruits and vegetables, whole grains, and beans.  Limiting foods that are high in fat and processed sugars, such as fried and  sweet foods. General instructions  Take over-the-counter and prescription medicines only as told by your health care provider.  If you were prescribed an antibiotic medicine, take it as told by your health care provider. Do not stop taking the antibiotic even if you start to feel better.  Use any implants or pessary as told by your health care provider.  If needed, wear pads to absorb urine leakage.  Keep a journal or log to track how much and when you drink and when you feel the need to urinate. This will help your health care provider monitor your condition.  Keep all follow-up visits as told by your health care provider. This is important. Contact a health care provider if:  You have a fever.  Your symptoms do not get better with treatment.  Your pain and discomfort get worse.  You have more frequent urges to urinate. Get help right away if:  You are not able to control your bladder. Summary  Overactive bladder refers to a condition in which a person has a sudden need to pass urine.  Several conditions may lead to an overactive bladder.  Treatment for overactive bladder depends on the cause and severity of your condition.  Follow your health care provider's instructions about lifestyle changes, doing Kegel exercises, keeping a journal, and taking medicines. This information is not intended to replace advice given to you by your health care provider. Make sure you discuss any questions you have with your health care provider. Document Revised: 10/26/2018 Document Reviewed: 07/21/2017 Elsevier Patient Education  [place].
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
## 2019-12-26 LAB — URINE CULTURE

## 2020-01-02 ENCOUNTER — Telehealth: Payer: Self-pay | Admitting: Certified Nurse Midwife

## 2020-01-02 NOTE — Telephone Encounter (Signed)
Patient called in saying the provider was going to send her to someone with physical therapy. Patient states she hasn't heard anything from anyone since. Could you please advise?

## 2020-01-03 NOTE — Telephone Encounter (Signed)
Spoke with Baili at [facility].  They have the referral. They currently have 2 providers doing pelvic floor therapy. There is a delay is scheduling.   Pt aware via VM. Advised pt if she had not received a call in 5 business days to contact them at [phone].

## 2020-01-03 NOTE — Telephone Encounter (Signed)
Status of physical therapy referral? JML

## 2020-01-15 ENCOUNTER — Other Ambulatory Visit: Payer: Self-pay | Admitting: Certified Nurse Midwife

## 2020-01-15 DIAGNOSIS — N3946 Mixed incontinence: Secondary | ICD-10-CM

## 2020-02-19 ENCOUNTER — Other Ambulatory Visit: Payer: Self-pay | Admitting: Certified Nurse Midwife

## 2020-02-19 ENCOUNTER — Other Ambulatory Visit: Payer: Self-pay

## 2020-02-19 DIAGNOSIS — N3946 Mixed incontinence: Secondary | ICD-10-CM

## 2020-02-19 MED ORDER — TOLTERODINE TARTRATE ER 4 MG PO CP24: 4.0000 mg | ORAL_CAPSULE | Freq: Every day | ORAL | 1 refills | Status: DC

## 2020-03-17 ENCOUNTER — Other Ambulatory Visit: Payer: Self-pay | Admitting: Certified Nurse Midwife

## 2020-03-17 DIAGNOSIS — N3946 Mixed incontinence: Secondary | ICD-10-CM

## 2020-03-20 ENCOUNTER — Ambulatory Visit (INDEPENDENT_AMBULATORY_CARE_PROVIDER_SITE_OTHER): Payer: Medicare Other

## 2020-03-20 ENCOUNTER — Ambulatory Visit (INDEPENDENT_AMBULATORY_CARE_PROVIDER_SITE_OTHER): Payer: Medicare Other | Admitting: Vascular Surgery

## 2020-03-20 ENCOUNTER — Other Ambulatory Visit: Payer: Self-pay

## 2020-03-20 ENCOUNTER — Encounter (INDEPENDENT_AMBULATORY_CARE_PROVIDER_SITE_OTHER): Payer: Self-pay | Admitting: Vascular Surgery

## 2020-03-20 VITALS — BP 88/56 | HR 108 | Ht 63.0 in | Wt 200.0 lb

## 2020-03-20 DIAGNOSIS — E118 Type 2 diabetes mellitus with unspecified complications: Secondary | ICD-10-CM | POA: Diagnosis not present

## 2020-03-20 DIAGNOSIS — I70211 Atherosclerosis of native arteries of extremities with intermittent claudication, right leg: Secondary | ICD-10-CM | POA: Diagnosis not present

## 2020-03-20 DIAGNOSIS — E782 Mixed hyperlipidemia: Secondary | ICD-10-CM
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
## 2020-03-21 ENCOUNTER — Encounter (INDEPENDENT_AMBULATORY_CARE_PROVIDER_SITE_OTHER): Payer: Self-pay | Admitting: Vascular Surgery

## 2020-04-18 ENCOUNTER — Other Ambulatory Visit: Payer: Self-pay

## 2020-04-18 ENCOUNTER — Encounter: Payer: Self-pay | Admitting: Certified Nurse Midwife

## 2020-04-18 ENCOUNTER — Ambulatory Visit (INDEPENDENT_AMBULATORY_CARE_PROVIDER_SITE_OTHER): Payer: Medicare Other | Admitting: Certified Nurse Midwife

## 2020-04-18 ENCOUNTER — Telehealth: Payer: Self-pay

## 2020-04-18 VITALS — BP 92/52 | HR 103 | Ht 63.0 in | Wt 203.8 lb

## 2020-04-18 DIAGNOSIS — Z0000 Encounter for general adult medical examination without abnormal findings: Secondary | ICD-10-CM

## 2020-04-18 DIAGNOSIS — N8110 Cystocele, unspecified: Secondary | ICD-10-CM | POA: Diagnosis not present

## 2020-04-18 DIAGNOSIS — N3946 Mixed incontinence: Secondary | ICD-10-CM

## 2020-04-18 DIAGNOSIS — Z01419 Encounter for gynecological examination (general) (routine) without abnormal findings: Secondary | ICD-10-CM | POA: Diagnosis not present

## 2020-04-18 DIAGNOSIS — Z1231 Encounter for screening mammogram for malignant neoplasm of breast: Secondary | ICD-10-CM

## 2020-04-18 MED ORDER — TOLTERODINE TARTRATE ER 4 MG PO CP24: 4.0000 mg | ORAL_CAPSULE | Freq: Every day | ORAL | 5 refills | Status: DC
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
## 2020-04-18 NOTE — Telephone Encounter (Signed)
Pt was checking out and she said that she needs a refill on her tolterodine sent to the [facility]. Please advise

## 2020-04-18 NOTE — Patient Instructions (Addendum)
Preventive Care 40-54 Years Old, Female Preventive care refers to visits with your health care provider and lifestyle choices that can promote health and wellness. This includes:  A yearly physical exam. This may also be called an annual well check.  Regular dental visits and eye exams.  Immunizations.  Screening for certain conditions.  Healthy lifestyle choices, such as eating a healthy diet, getting regular exercise, not using drugs or products that contain nicotine and tobacco, and limiting alcohol use. What can I expect for my preventive care visit? Physical exam Your health care provider will check your:  Height and weight. This may be used to calculate body mass index (BMI), which tells if you are at a healthy weight.  Heart rate and blood pressure.  Skin for abnormal spots. Counseling Your health care provider may ask you questions about your:  Alcohol, tobacco, and drug use.  Emotional well-being.  Home and relationship well-being.  Sexual activity.  Eating habits.  Work and work environment.  Method of birth control.  Menstrual cycle.  Pregnancy history. What immunizations do I need?  Influenza (flu) vaccine  This is recommended every year. Tetanus, diphtheria, and pertussis (Tdap) vaccine  You may need a Td booster every 10 years. Varicella (chickenpox) vaccine  You may need this if you have not been vaccinated. Zoster (shingles) vaccine  You may need this after age 60. Measles, mumps, and rubella (MMR) vaccine  You may need at least one dose of MMR if you were born in 1957 or later. You may also need a second dose. Pneumococcal conjugate (PCV13) vaccine  You may need this if you have certain conditions and were not previously vaccinated. Pneumococcal polysaccharide (PPSV23) vaccine  You may need one or two doses if you smoke cigarettes or if you have certain conditions. Meningococcal conjugate (MenACWY) vaccine  You may need this if you  have certain conditions. Hepatitis A vaccine  You may need this if you have certain conditions or if you travel or work in places where you may be exposed to hepatitis A. Hepatitis B vaccine  You may need this if you have certain conditions or if you travel or work in places where you may be exposed to hepatitis B. Haemophilus influenzae type b (Hib) vaccine  You may need this if you have certain conditions. Human papillomavirus (HPV) vaccine  If recommended by your health care provider, you may need three doses over 6 months. You may receive vaccines as individual doses or as more than one vaccine together in one shot (combination vaccines). Talk with your health care provider about the risks and benefits of combination vaccines. What tests do I need? Blood tests  Lipid and cholesterol levels. These may be checked every 5 years, or more frequently if you are over 50 years old.  Hepatitis C test.  Hepatitis B test. Screening  Lung cancer screening. You may have this screening every year starting at age 55 if you have a 30-pack-year history of smoking and currently smoke or have quit within the past 15 years.  Colorectal cancer screening. All adults should have this screening starting at age 50 and continuing until age 75. Your health care provider may recommend screening at age 45 if you are at increased risk. You will have tests every 1-10 years, depending on your results and the type of screening test.  Diabetes screening. This is done by checking your blood sugar (glucose) after you have not eaten for a while (fasting). You may have this   done every 1-3 years.  Mammogram. This may be done every 1-2 years. Talk with your health care provider about when you should start having regular mammograms. This may depend on whether you have a family history of breast cancer.  BRCA-related cancer screening. This may be done if you have a family history of breast, ovarian, tubal, or peritoneal  cancers.  Pelvic exam and Pap test. This may be done every 3 years starting at age 21. Starting at age 30, this may be done every 5 years if you have a Pap test in combination with an HPV test. Other tests  Sexually transmitted disease (STD) testing.  Bone density scan. This is done to screen for osteoporosis. You may have this scan if you are at high risk for osteoporosis. Follow these instructions at home: Eating and drinking  Eat a diet that includes fresh fruits and vegetables, whole grains, lean protein, and low-fat dairy.  Take vitamin and mineral supplements as recommended by your health care provider.  Do not drink alcohol if: ? Your health care provider tells you not to drink. ? You are pregnant, may be pregnant, or are planning to become pregnant.  If you drink alcohol: ? Limit how much you have to 0-1 drink a day. ? Be aware of how much alcohol is in your drink. In the [place] one drink equals one 12 oz bottle of beer (355 mL), one 5 oz glass of wine (148 mL), or one 1 oz glass of hard liquor (44 mL). Lifestyle  Take daily care of your teeth and gums.  Stay active. Exercise for at least 30 minutes on 5 or more days each week.  Do not use any products that contain nicotine or tobacco, such as cigarettes, e-cigarettes, and chewing tobacco. If you need help quitting, ask your health care provider.  If you are sexually active, practice safe sex. Use a condom or other form of birth control (contraception) in order to prevent pregnancy and STIs (sexually transmitted infections).  If told by your health care provider, take low-dose aspirin daily starting at age 50. What's next?  Visit your health care provider once a year for a well check visit.  Ask your health care provider how often you should have your eyes and teeth checked.  Stay up to date on all vaccines. This information is not intended to replace advice given to you by your health care provider. Make sure you  discuss any questions you have with your health care provider. Document Revised: 03/16/2018 Document Reviewed: 03/16/2018 Elsevier Patient Education  2020 Elsevier Inc.    Kegel Exercises  Kegel exercises can help strengthen your pelvic floor muscles. The pelvic floor is a group of muscles that support your rectum, small intestine, and bladder. In females, pelvic floor muscles also help support the womb (uterus). These muscles help you control the flow of urine and stool. Kegel exercises are painless and simple, and they do not require any equipment. Your provider may suggest Kegel exercises to:  Improve bladder and bowel control.  Improve sexual response.  Improve weak pelvic floor muscles after surgery to remove the uterus (hysterectomy) or pregnancy (females).  Improve weak pelvic floor muscles after prostate gland removal or surgery (males). Kegel exercises involve squeezing your pelvic floor muscles, which are the same muscles you squeeze when you try to stop the flow of urine or keep from passing gas. The exercises can be done while sitting, standing, or lying down, but it is best to vary   your position. Exercises How to do Kegel exercises: 1. Squeeze your pelvic floor muscles tight. You should feel a tight lift in your rectal area. If you are a female, you should also feel a tightness in your vaginal area. Keep your stomach, buttocks, and legs relaxed. 2. Hold the muscles tight for up to 10 seconds. 3. Breathe normally. 4. Relax your muscles. 5. Repeat as told by your health care provider. Repeat this exercise daily as told by your health care provider. Continue to do this exercise for at least 4-6 weeks, or for as long as told by your health care provider. You may be referred to a physical therapist who can help you learn more about how to do Kegel exercises. Depending on your condition, your health care provider may recommend:  Varying how long you squeeze your  muscles.  Doing several sets of exercises every day.  Doing exercises for several weeks.  Making Kegel exercises a part of your regular exercise routine. This information is not intended to replace advice given to you by your health care provider. Make sure you discuss any questions you have with your health care provider. Document Revised: 02/22/2018 Document Reviewed: 02/22/2018 Elsevier Patient Education  2020 Elsevier Inc.  

## 2020-04-22 ENCOUNTER — Encounter: Payer: Medicare Other | Admitting: Physical Therapy

## 2020-04-29 ENCOUNTER — Encounter: Payer: Self-pay | Admitting: Physical Therapy

## 2020-04-29 ENCOUNTER — Ambulatory Visit: Payer: Medicare Other | Attending: Certified Nurse Midwife | Admitting: Physical Therapy

## 2020-04-29 ENCOUNTER — Other Ambulatory Visit: Payer: Self-pay

## 2020-04-29 DIAGNOSIS — M6208 Separation of muscle (nontraumatic), other site: Secondary | ICD-10-CM | POA: Insufficient documentation

## 2020-04-29 DIAGNOSIS — M791 Myalgia, unspecified site: Secondary | ICD-10-CM | POA: Insufficient documentation

## 2020-04-29 DIAGNOSIS — M6281 Muscle weakness (generalized): Secondary | ICD-10-CM | POA: Diagnosis present

## 2020-04-29 DIAGNOSIS — R293 Abnormal posture: Secondary | ICD-10-CM | POA: Diagnosis present

## 2020-04-29 NOTE — Patient Instructions (Addendum)
16 fl oz lemonade, 12 fl oz soda =   32 fl oz bladder irritant  ( see handout)  48 fl oz water.    __  Try decreasing lemonade from 16 fl oz to 8 fl oz.  To make ration between bladder irritants to water 1:2   _  Improve flexibility to thoracic spine   Lengthen Back rib by _ shoulder    Lie on   side , pillow between knees  Pull  arm overhead over mattress, grab the edge of mattress,pull it upward, drawing elbow away from ears  Breathing   Open book (handout)  Lying on  _ side , rotating  _   ___  Improve L hip strength    Clam Shell 45 Degrees  Lying with hips and knees bent 45, one pillow between knees and ankles. Heel together, toes apart like ballerina,  Lift knee with exhale while pressing heels together. Be sure pelvis does not roll backward. Do not arch back. Do 20 times L  leg, 2 times per day.     Complimentary stretch: Figure-4
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
## 2020-05-06 ENCOUNTER — Ambulatory Visit: Payer: Medicare Other | Admitting: Physical Therapy

## 2020-05-13 ENCOUNTER — Ambulatory Visit: Payer: Medicare Other | Admitting: Physical Therapy

## 2020-05-20 ENCOUNTER — Ambulatory Visit: Payer: Medicare Other | Admitting: Physical Therapy

## 2020-05-27 ENCOUNTER — Ambulatory Visit: Payer: Medicare Other | Attending: Certified Nurse Midwife | Admitting: Physical Therapy

## 2020-05-27 ENCOUNTER — Other Ambulatory Visit: Payer: Self-pay

## 2020-05-27 DIAGNOSIS — M6208 Separation of muscle (nontraumatic), other site: Secondary | ICD-10-CM

## 2020-05-27 DIAGNOSIS — R293 Abnormal posture: Secondary | ICD-10-CM

## 2020-05-27 DIAGNOSIS — M533 Sacrococcygeal disorders, not elsewhere classified: Secondary | ICD-10-CM | POA: Insufficient documentation

## 2020-05-27 DIAGNOSIS — M791 Myalgia, unspecified site: Secondary | ICD-10-CM

## 2020-05-27 DIAGNOSIS — M6281 Muscle weakness (generalized): Secondary | ICD-10-CM | POA: Diagnosis not present
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
## 2020-05-27 NOTE — Patient Instructions (Signed)
Abdominal massage upward from L,  R , center to belly button 3 stroke x 3 , pressure is gentle and light with all fingers flat, not using finger tips    Deep core level 1 and 2  ( handout)

## 2020-06-03 ENCOUNTER — Ambulatory Visit: Payer: Medicare Other | Admitting: Physical Therapy

## 2020-06-10 ENCOUNTER — Other Ambulatory Visit: Payer: Self-pay | Admitting: Cardiovascular Disease

## 2020-06-10 ENCOUNTER — Ambulatory Visit: Payer: Medicare Other | Admitting: Physical Therapy

## 2020-06-10 MED ORDER — EZETIMIBE 10 MG PO TABS: 10.0000 mg | ORAL_TABLET | Freq: Every day | ORAL | 3 refills | Status: DC

## 2020-06-10 NOTE — Telephone Encounter (Signed)
Requested Prescriptions   Signed Prescriptions Disp Refills   ezetimibe (ZETIA) 10 MG tablet 90 tablet 3    Sig: Take 1 tablet (10 mg total) by mouth daily.    Authorizing Provider: GOLLAN, TIMOTHY J    Ordering User: Rachell Druckenmiller C    

## 2020-06-10 NOTE — Telephone Encounter (Signed)
*  STAT* If patient is at the pharmacy, call can be transferred to refill team.   1. Which medications need to be refilled? (please list name of each medication and dose if known)    Zetia 10 mg po q d   2. Which pharmacy/location (including street and city if local pharmacy) is medication to be sent to?  [place]   3. Do they need a 30 day or 90 day supply? [place]

## 2020-06-17 ENCOUNTER — Ambulatory Visit: Payer: Medicare Other | Admitting: Physical Therapy

## 2020-06-24 ENCOUNTER — Ambulatory Visit: Payer: Medicare Other | Attending: Certified Nurse Midwife | Admitting: Physical Therapy

## 2020-07-01 ENCOUNTER — Ambulatory Visit: Payer: Medicare Other | Admitting: Physical Therapy

## 2020-07-08 ENCOUNTER — Ambulatory Visit: Payer: Medicare Other | Admitting: Physical Therapy

## 2020-07-17 ENCOUNTER — Other Ambulatory Visit: Payer: Self-pay | Admitting: Nephrology

## 2020-07-17 DIAGNOSIS — D649 Anemia, unspecified: Secondary | ICD-10-CM

## 2020-07-17 DIAGNOSIS — N1832 Chronic kidney disease, stage 3b: Secondary | ICD-10-CM

## 2020-07-17 DIAGNOSIS — R809 Proteinuria, unspecified: Secondary | ICD-10-CM

## 2020-07-17 DIAGNOSIS — E1122 Type 2 diabetes mellitus with diabetic chronic kidney disease: Secondary | ICD-10-CM

## 2020-07-31 ENCOUNTER — Other Ambulatory Visit: Payer: Self-pay

## 2020-07-31 ENCOUNTER — Telehealth: Payer: Self-pay | Admitting: Cardiovascular Disease

## 2020-07-31 ENCOUNTER — Ambulatory Visit
Admission: RE | Admit: 2020-07-31 | Discharge: 2020-07-31 | Disposition: A | Discharge status: Home / Self Care | Payer: Medicare HMO | Source: Ambulatory Visit | Attending: Nephrology | Admitting: Nephrology

## 2020-07-31 ENCOUNTER — Ambulatory Visit: Payer: Medicare HMO

## 2020-07-31 DIAGNOSIS — E1122 Type 2 diabetes mellitus with diabetic chronic kidney disease: Secondary | ICD-10-CM | POA: Insufficient documentation

## 2020-07-31 DIAGNOSIS — N1832 Chronic kidney disease, stage 3b: Secondary | ICD-10-CM | POA: Insufficient documentation

## 2020-07-31 DIAGNOSIS — R809 Proteinuria, unspecified: Secondary | ICD-10-CM | POA: Insufficient documentation

## 2020-07-31 DIAGNOSIS — D649 Anemia, unspecified: Secondary | ICD-10-CM | POA: Insufficient documentation

## 2020-07-31 NOTE — Telephone Encounter (Signed)
  Patient Consent for Virtual Visit         Donna Fisher has provided verbal consent on 07/31/2020 for a virtual visit (video or telephone).   CONSENT FOR VIRTUAL VISIT FOR:  Donna Fisher  By participating in this virtual visit I agree to the following:  I hereby voluntarily request, consent and authorize [facility] and its employed or contracted physicians, physician assistants, nurse practitioners or other licensed health care professionals (the Practitioner), to provide me with telemedicine health care services (the "Services") as deemed necessary by the treating Practitioner. I acknowledge and consent to receive the Services by the Practitioner via telemedicine. I understand that the telemedicine visit will involve communicating with the Practitioner through live audiovisual communication technology and the disclosure of certain medical information by electronic transmission. I acknowledge that I have been given the opportunity to request an in-person assessment or other available alternative prior to the telemedicine visit and am voluntarily participating in the telemedicine visit.  I understand that I have the right to withhold or withdraw my consent to the use of telemedicine in the course of my care at any time, without affecting my right to future care or treatment, and that the Practitioner or I may terminate the telemedicine visit at any time. I understand that I have the right to inspect all information obtained and/or recorded in the course of the telemedicine visit and may receive copies of available information for a reasonable fee.  I understand that some of the potential risks of receiving the Services via telemedicine include:  [place] Delay or interruption in medical evaluation due to technological equipment failure or disruption; . Information transmitted may not be sufficient (e.g. poor resolution of images) to allow for appropriate medical decision making by the Practitioner;  and/or  . In rare instances, security protocols could fail, causing a breach of personal health information.  Furthermore, I acknowledge that it is my responsibility to provide information about my medical history, conditions and care that is complete and accurate to the best of my ability. I acknowledge that Practitioner's advice, recommendations, and/or decision may be based on factors not within their control, such as incomplete or inaccurate data provided by me or distortions of diagnostic images or specimens that may result from electronic transmissions. I understand that the practice of medicine is not an exact science and that Practitioner makes no warranties or guarantees regarding treatment outcomes. I acknowledge that a copy of this consent can be made available to me via my patient portal ([facility]), or I can request a printed copy by calling the office of [facility].    I understand that my insurance will be billed for this visit.   I have read or had this consent read to me. . I understand the contents of this consent, which adequately explains the benefits and risks of the Services being provided via telemedicine.  . I have been provided ample opportunity to ask questions regarding this consent and the Services and have had my questions answered to my satisfaction. . I give my informed consent for the services to be provided through the use of telemedicine in my medical care
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
## 2020-08-04 ENCOUNTER — Telehealth (INDEPENDENT_AMBULATORY_CARE_PROVIDER_SITE_OTHER): Payer: Medicare HMO | Admitting: Cardiovascular Disease

## 2020-08-04 ENCOUNTER — Encounter: Payer: Self-pay | Admitting: Cardiovascular Disease

## 2020-08-04 ENCOUNTER — Other Ambulatory Visit: Payer: Self-pay

## 2020-08-04 VITALS — BP 128/68 | HR 96 | Ht 64.0 in | Wt 203.0 lb

## 2020-08-04 DIAGNOSIS — I739 Peripheral vascular disease, unspecified: Secondary | ICD-10-CM | POA: Diagnosis not present

## 2020-08-04 DIAGNOSIS — Z794 Long term (current) use of insulin: Secondary | ICD-10-CM

## 2020-08-04 DIAGNOSIS — E1152 Type 2 diabetes mellitus with diabetic peripheral angiopathy with gangrene: Secondary | ICD-10-CM

## 2020-08-04 DIAGNOSIS — E782 Mixed hyperlipidemia: Secondary | ICD-10-CM

## 2020-08-04 DIAGNOSIS — I951 Orthostatic hypotension: Secondary | ICD-10-CM

## 2020-08-04 MED ORDER — MIDODRINE HCL 10 MG PO TABS: 10.0000 mg | ORAL_TABLET | Freq: Three times a day (TID) | ORAL | 3 refills | Status: DC | PRN

## 2020-08-04 MED ORDER — EZETIMIBE 10 MG PO TABS: 10.0000 mg | ORAL_TABLET | Freq: Every day | ORAL | 3 refills | Status: DC

## 2020-08-04 MED ORDER — ASPIRIN EC 325 MG PO TBEC: 325.0000 mg | DELAYED_RELEASE_TABLET | Freq: Every day | ORAL | 3 refills | Status: DC

## 2020-08-04 NOTE — Patient Instructions (Addendum)
Medication Instructions:  [place] consult for repatha/praluent was sent to help with patient assistance due to intolerance of statin drugs (crestor lipitor) All your cardiac related medications have refills added to them, may refill when you need  Lab work: No new labs needed  Testing/Procedures: No new testing needed   Follow-Up: At [facility], you and your health needs are our priority.  As part of our continuing mission to provide you with exceptional heart care, we have created designated Provider Care Teams.  These Care Teams include your primary Cardiologist (physician) and Advanced Practice Providers (APPs -  Physician Assistants and Nurse Practitioners) who all work together to provide you with the care you need, when you need it.  . You will need a follow up appointment in 6 months  . Providers on your designated Care Team:   . Murray Hodgkins, NP . Christell Faith, PA-C . Marrianne Mood, PA-C  Any Other Special Instructions Will Be Listed Below (If Applicable).  COVID-19 Vaccine Information can be found at: ShippingScam.co.uk For questions related to vaccine distribution or appointments, please email vaccine@[email or web address] .com or call [phone].

## 2020-08-05 DIAGNOSIS — E782 Mixed hyperlipidemia: Secondary | ICD-10-CM

## 2020-08-05 NOTE — Progress Notes (Signed)
Referral sent to [facility] for Repatha assistance, for appointment with [facility] (pharmacist) at [place] or [place] location to discuss PCSK9i initiation. Pt needs assistance with repatha/praluent, pt is intolerance of statin (crestor, lipitor)

## 2020-08-06 ENCOUNTER — Telehealth: Payer: Self-pay | Admitting: Cardiovascular Disease

## 2020-08-06 DIAGNOSIS — E782 Mixed hyperlipidemia: Secondary | ICD-10-CM

## 2020-08-06 MED ORDER — EVOLOCUMAB 140 MG/ML ~~LOC~~ SOAJ: 1.0000 mL | SUBCUTANEOUS | 1 refills | Status: DC

## 2020-08-06 NOTE — Telephone Encounter (Signed)
Rx sent to pharmacy.  Patient reports she has [employer], however only has Medicaid on file.  Will likely be a prior authorization.  Please find out if patient would like to schedule an appointment for [place] training.

## 2020-08-06 NOTE — Telephone Encounter (Signed)
°*  STAT* If patient is at the pharmacy, call can be transferred to refill team.   1. Which medications need to be refilled? (please list name of each medication and dose if known) repatha  2. Which pharmacy/location (including street and city if local pharmacy) is medication to be sent to? [facility]  3. Do they need a 30 day or 90 day supply? [place]

## 2020-08-06 NOTE — Telephone Encounter (Signed)
Patient is calling regarding her Repatha. Please call to discuss details.

## 2020-08-06 NOTE — Telephone Encounter (Signed)
Spoke with pt about her Repatha, advised the lipid clinic from [facility] has a referral and they should be in touch within the few days or next week (weather permitting) for consult, pt verbalized understanding, advised if have not received a call by end of next week to call back.

## 2020-08-06 NOTE — Telephone Encounter (Signed)
fyi

## 2020-08-06 NOTE — Telephone Encounter (Signed)
Is this a new medication for patient?

## 2020-08-06 NOTE — Telephone Encounter (Signed)
Its possible, she said that [place] had her check with insurance before he prescibed and she did and now wants it

## 2020-08-06 NOTE — Telephone Encounter (Signed)
PA approved for Repatha 

## 2020-08-18 ENCOUNTER — Encounter: Payer: Self-pay | Admitting: Family Medicine

## 2020-08-18 ENCOUNTER — Telehealth: Payer: Self-pay

## 2020-08-18 NOTE — Telephone Encounter (Signed)
Unable to reach pt, several attempts have been made. A [facility] appt for lipid management (repatha assistance) has been unsuccessful, the clinic attempted to contact this patient to schedule 3 times last week with no success. Several voice messages were left. They have closed the referral, but if the patient calls [place] back when they can still schedule her. MyChart message sent to pt in hope she will reach out.

## 2020-09-02 ENCOUNTER — Telehealth: Payer: Self-pay | Admitting: Cardiovascular Disease

## 2020-09-02 NOTE — Telephone Encounter (Signed)
Patient states Dr. Rockey Situ ordered her to have blood work in [place] for tomorrow, and she would like that to be done here in [place]. She states this is for her cholesterol. Please call to discuss.

## 2020-09-02 NOTE — Telephone Encounter (Signed)
Was able to call and speak with pt, noted that there are not any labs orders that I can see. Pt reports received a letter in the mail stating she has missed several appts, letter was from [facility], pt reports she called the number and the lady "said I needed labs so she put them in to [place]". Advised pt that may have been the [place] office, I did not see in pt's chart of any telephone notes from yesterday, last note was 08/18/2020 and did not see any current lab orders. Pt verbalized understanding and will call [place].

## 2020-09-03 ENCOUNTER — Encounter: Payer: Self-pay | Admitting: General Practice

## 2020-09-19 ENCOUNTER — Ambulatory Visit: Payer: Medicare HMO

## 2020-09-23 ENCOUNTER — Ambulatory Visit
Admission: RE | Admit: 2020-09-23 | Discharge: 2020-09-23 | Disposition: A | Discharge status: Home / Self Care | Payer: Medicare HMO | Source: Ambulatory Visit | Attending: Certified Nurse Midwife | Admitting: Certified Nurse Midwife

## 2020-09-23 ENCOUNTER — Other Ambulatory Visit: Payer: Self-pay

## 2020-09-23 DIAGNOSIS — Z01419 Encounter for gynecological examination (general) (routine) without abnormal findings: Secondary | ICD-10-CM

## 2020-09-23 DIAGNOSIS — Z1231 Encounter for screening mammogram for malignant neoplasm of breast: Secondary | ICD-10-CM | POA: Diagnosis present

## 2020-10-22 ENCOUNTER — Other Ambulatory Visit: Payer: Self-pay | Admitting: Cardiovascular Disease

## 2020-10-22 DIAGNOSIS — E782 Mixed hyperlipidemia: Secondary | ICD-10-CM

## 2020-10-22 NOTE — Telephone Encounter (Signed)
*  STAT* If patient is at the pharmacy, call can be transferred to refill team.   1. Which medications need to be refilled? (please list name of each medication and dose if known) Repatha 140 MG   2. Which pharmacy/location (including street and city if local pharmacy) is medication to be sent to? [facility]  3. Do they need a 30 day or 90 day supply?

## 2020-10-23 ENCOUNTER — Other Ambulatory Visit: Payer: Self-pay

## 2020-10-23 ENCOUNTER — Encounter: Payer: Self-pay | Admitting: Ophthalmology

## 2020-10-23 DIAGNOSIS — E782 Mixed hyperlipidemia: Secondary | ICD-10-CM

## 2020-10-23 MED ORDER — REPATHA SURECLICK 140 MG/ML ~~LOC~~ SOAJ: SUBCUTANEOUS | 3 refills | Status: DC

## 2020-10-26 DIAGNOSIS — N3946 Mixed incontinence: Secondary | ICD-10-CM

## 2020-10-29 NOTE — Discharge Instructions (Signed)

## 2020-10-31 ENCOUNTER — Encounter: Payer: Self-pay | Admitting: Ophthalmology

## 2020-10-31 ENCOUNTER — Other Ambulatory Visit: Payer: Self-pay

## 2020-10-31 ENCOUNTER — Encounter
Admission: RE | Disposition: A | Discharge status: Home / Self Care | Payer: Self-pay | Source: Home / Self Care | Attending: Ophthalmology

## 2020-10-31 ENCOUNTER — Ambulatory Visit: Payer: Medicare HMO | Admitting: Anesthesiology

## 2020-10-31 ENCOUNTER — Ambulatory Visit
Admission: RE | Admit: 2020-10-31 | Discharge: 2020-10-31 | Disposition: A | Discharge status: Home / Self Care | Payer: Medicare HMO | Source: Home / Self Care | Attending: Ophthalmology | Admitting: Ophthalmology

## 2020-10-31 DIAGNOSIS — Z885 Allergy status to narcotic agent status: Secondary | ICD-10-CM | POA: Insufficient documentation

## 2020-10-31 DIAGNOSIS — Z794 Long term (current) use of insulin: Secondary | ICD-10-CM | POA: Insufficient documentation

## 2020-10-31 DIAGNOSIS — H2512 Age-related nuclear cataract, left eye: Secondary | ICD-10-CM | POA: Insufficient documentation

## 2020-10-31 DIAGNOSIS — Z880 Allergy status to penicillin: Secondary | ICD-10-CM | POA: Insufficient documentation

## 2020-10-31 DIAGNOSIS — Z7982 Long term (current) use of aspirin: Secondary | ICD-10-CM | POA: Insufficient documentation

## 2020-10-31 DIAGNOSIS — Z7984 Long term (current) use of oral hypoglycemic drugs: Secondary | ICD-10-CM | POA: Insufficient documentation

## 2020-10-31 DIAGNOSIS — F17210 Nicotine dependence, cigarettes, uncomplicated: Secondary | ICD-10-CM | POA: Insufficient documentation

## 2020-10-31 DIAGNOSIS — E1136 Type 2 diabetes mellitus with diabetic cataract: Secondary | ICD-10-CM | POA: Insufficient documentation

## 2020-10-31 DIAGNOSIS — Z888 Allergy status to other drugs, medicaments and biological substances status: Secondary | ICD-10-CM | POA: Insufficient documentation

## 2020-10-31 DIAGNOSIS — Z881 Allergy status to other antibiotic agents status: Secondary | ICD-10-CM | POA: Insufficient documentation

## 2020-10-31 DIAGNOSIS — Z79899 Other long term (current) drug therapy: Secondary | ICD-10-CM | POA: Insufficient documentation

## 2020-10-31 DIAGNOSIS — E11621 Type 2 diabetes mellitus with foot ulcer: Secondary | ICD-10-CM | POA: Diagnosis not present

## 2020-10-31 DIAGNOSIS — A419 Sepsis, unspecified organism: Secondary | ICD-10-CM | POA: Diagnosis not present

## 2020-10-31 HISTORY — DX: Other specified postprocedural states: Z98.890

## 2020-10-31 HISTORY — DX: Myoneural disorder, unspecified: G70.9

## 2020-10-31 HISTORY — DX: Nausea with vomiting, unspecified: R11.2

## 2020-10-31 HISTORY — PX: CATARACT EXTRACTION W/PHACO: SHX586

## 2020-10-31 LAB — GLUCOSE, CAPILLARY
Glucose-Capillary: 100 mg/dL — ABNORMAL HIGH (ref 70–99)
Glucose-Capillary: 122 mg/dL — ABNORMAL HIGH (ref 70–99)

## 2020-10-31 SURGERY — PHACOEMULSIFICATION, CATARACT, WITH IOL INSERTION
Anesthesia: Monitor Anesthesia Care | Site: Eye | Laterality: Left

## 2020-10-31 MED ORDER — MOXIFLOXACIN HCL 0.5 % OP SOLN
OPHTHALMIC | Status: DC | PRN
  Administered 2020-10-31: 0.2 mL via OPHTHALMIC

## 2020-10-31 MED ORDER — LIDOCAINE HCL (PF) 2 % IJ SOLN
INTRAOCULAR | Status: DC | PRN
  Administered 2020-10-31: 1 mL

## 2020-10-31 MED ORDER — NA HYALUR & NA CHOND-NA HYALUR 0.4-0.35 ML IO KIT
PACK | INTRAOCULAR | Status: DC | PRN
  Administered 2020-10-31: 1 mL via INTRAOCULAR

## 2020-10-31 MED ORDER — MIDAZOLAM HCL 2 MG/2ML IJ SOLN
INTRAMUSCULAR | Status: DC | PRN
  Administered 2020-10-31: 2 mg via INTRAVENOUS

## 2020-10-31 MED ORDER — FENTANYL CITRATE (PF) 100 MCG/2ML IJ SOLN
INTRAMUSCULAR | Status: DC | PRN
  Administered 2020-10-31: 50 ug via INTRAVENOUS

## 2020-10-31 MED ORDER — TETRACAINE HCL 0.5 % OP SOLN
1.0000 [drp] | OPHTHALMIC | Status: DC | PRN
  Administered 2020-10-31 (×3): 1 [drp] via OPHTHALMIC

## 2020-10-31 MED ORDER — BRIMONIDINE TARTRATE-TIMOLOL 0.2-0.5 % OP SOLN
OPHTHALMIC | Status: DC | PRN
  Administered 2020-10-31: 1 [drp] via OPHTHALMIC

## 2020-10-31 MED ORDER — ARMC OPHTHALMIC DILATING DROPS
1.0000 "application " | OPHTHALMIC | Status: DC | PRN
  Administered 2020-10-31 (×3): 1 via OPHTHALMIC

## 2020-10-31 MED ORDER — EPINEPHRINE PF 1 MG/ML IJ SOLN
INTRAOCULAR | Status: DC | PRN
  Administered 2020-10-31: 71 mL via OPHTHALMIC

## 2020-10-31 SURGICAL SUPPLY — 17 items
CANNULA ANT/CHMB 27GA (MISCELLANEOUS) ×2 IMPLANT
GLOVE SURG TRIUMPH 8.0 PF LTX (GLOVE) ×2 IMPLANT
GOWN STRL REUS W/ TWL LRG LVL3 (GOWN DISPOSABLE) ×2 IMPLANT
GOWN STRL REUS W/TWL LRG LVL3 (GOWN DISPOSABLE) ×4
LENS IOL TECNIS EYHANCE 16.5 (Intraocular Lens) ×2 IMPLANT
MARKER SKIN DUAL TIP RULER LAB (MISCELLANEOUS) ×2 IMPLANT
NEEDLE CAPSULORHEX 25GA (NEEDLE) ×2 IMPLANT
NEEDLE FILTER BLUNT 18X 1/2SAF (NEEDLE) ×2
NEEDLE FILTER BLUNT 18X1 1/2 (NEEDLE) ×2 IMPLANT
PACK CATARACT BRASINGTON (MISCELLANEOUS) ×2 IMPLANT
PACK EYE AFTER SURG (MISCELLANEOUS) ×2 IMPLANT
PACK OPTHALMIC (MISCELLANEOUS) ×2 IMPLANT
SOLUTION OPHTHALMIC SALT (MISCELLANEOUS) ×2 IMPLANT
SYR 3ML LL SCALE MARK (SYRINGE) ×4 IMPLANT
SYR TB 1ML LUER SLIP (SYRINGE) ×2 IMPLANT
WATER STERILE IRR 250ML POUR (IV SOLUTION) ×2 IMPLANT
WIPE NON LINTING 3.25X3.25 (MISCELLANEOUS) ×2 IMPLANT

## 2020-10-31 NOTE — Anesthesia Postprocedure Evaluation (Signed)
Anesthesia Post Note  Patient: Donna Fisher  Procedure(s) Performed: CATARACT EXTRACTION PHACO AND INTRAOCULAR LENS PLACEMENT (IOC) LEFT DIABETIC 8.30 01:14.8 11.1% (Left Eye)     Patient location during evaluation: PACU Anesthesia Type: MAC Level of consciousness: awake and alert Pain management: pain level controlled Vital Signs Assessment: post-procedure vital signs reviewed and stable Respiratory status: spontaneous breathing Cardiovascular status: blood pressure returned to baseline Postop Assessment: no apparent nausea or vomiting, adequate PO intake and no headache Anesthetic complications: no   No complications documented.  Adele Barthel Christophr Calix

## 2020-10-31 NOTE — Anesthesia Procedure Notes (Signed)
Procedure Name: MAC Performed by: Dewey Viens, CRNA Pre-anesthesia Checklist: Patient identified, Emergency Drugs available, Suction available, Timeout performed and Patient being monitored Patient Re-evaluated:Patient Re-evaluated prior to induction Oxygen Delivery Method: Nasal cannula Placement Confirmation: positive ETCO2       

## 2020-10-31 NOTE — Op Note (Signed)
    OPERATIVE NOTE  Donna Fisher [id number] 10/31/2020   PREOPERATIVE DIAGNOSIS:  Nuclear sclerotic cataract left eye. H25.12   POSTOPERATIVE DIAGNOSIS:    Nuclear sclerotic cataract left eye.     PROCEDURE:  Phacoemusification with posterior chamber intraocular lens placement of the left eye  Ultrasound time: Procedure(s): CATARACT EXTRACTION PHACO AND INTRAOCULAR LENS PLACEMENT (IOC) LEFT DIABETIC 8.30 01:14.8 11.1% (Left)  LENS:   Implant Name Type Inv. Item Serial No. Manufacturer Lot No. LRB No. Used Action  LENS IOL TECNIS EYHANCE 16.5 - [id number] Intraocular Lens LENS IOL TECNIS EYHANCE 16.5 [id number]   Left 1 Implanted      SURGEON:  Wyonia Hough, MD   ANESTHESIA:  Topical with tetracaine drops and 2% Xylocaine jelly, augmented with 1% preservative-free intracameral lidocaine.    COMPLICATIONS:  None.   DESCRIPTION OF PROCEDURE:  The patient was identified in the holding room and transported to the operating room and placed in the supine position under the operating microscope.  The left eye was identified as the operative eye and it was prepped and draped in the usual sterile ophthalmic fashion.   A 1 millimeter clear-corneal paracentesis was made at the 1:30 position.  0.5 ml of preservative-free 1% lidocaine was injected into the anterior chamber.  The anterior chamber was filled with Viscoat viscoelastic.  A 2.4 millimeter keratome was used to make a near-clear corneal incision at the 10:30 position.  .  A curvilinear capsulorrhexis was made with a cystotome and capsulorrhexis forceps.  Balanced salt solution was used to hydrodissect and hydrodelineate the nucleus.   Phacoemulsification was then used in stop and chop fashion to remove the lens nucleus and epinucleus.  The remaining cortex was then removed using the irrigation and aspiration handpiece. Provisc was then placed into the capsular bag to distend it for lens placement.  A lens was then  injected into the capsular bag.  The remaining viscoelastic was aspirated.   Wounds were hydrated with balanced salt solution.  The anterior chamber was inflated to a physiologic pressure with balanced salt solution.  No wound leaks were noted. Vigamox 0.2 ml of a '1mg'$  per ml solution was injected into the anterior chamber for a dose of 0.2 mg of intracameral antibiotic at the completion of the case.   Timolol and Brimonidine drops were applied to the eye.  The patient was taken to the recovery room in stable condition without complications of anesthesia or surgery.  Avantae Bither 10/31/2020, 8:33 AM

## 2020-10-31 NOTE — Anesthesia Preprocedure Evaluation (Signed)
Anesthesia Evaluation  Patient identified by MRN, date of birth, ID band Patient awake    History of Anesthesia Complications Negative for: history of anesthetic complications  Airway Mallampati: III  TM Distance: >3 FB Neck ROM: Full    Dental no notable dental hx.    Pulmonary Current Smoker (1/2 - 1ppd)Patient did not abstain from smoking.,    Pulmonary exam normal        Cardiovascular Exercise Tolerance: Poor Normal cardiovascular exam  Hypotension, on midodrine   Neuro/Psych Diabetic neuropathy and foot ulcer TIA (2015)   GI/Hepatic negative GI ROS, Neg liver ROS,   Endo/Other  diabetes, Poorly Controlled, Type 2, Insulin Dependent, Oral Hypoglycemic Agents  Renal/GU Renal disease (stage 3 CKD)     Musculoskeletal   Abdominal   Peds  Hematology   Anesthesia Other Findings   Reproductive/Obstetrics                             Anesthesia Physical Anesthesia Plan  ASA: III  Anesthesia Plan: MAC   Post-op Pain Management:    Induction: Intravenous  PONV Risk Score and Plan: 1 and Midazolam, TIVA and Treatment may vary due to age or medical condition  Airway Management Planned: Nasal Cannula and Natural Airway  Additional Equipment: None  Intra-op Plan:   Post-operative Plan:   Informed Consent: I have reviewed the patients History and Physical, chart, labs and discussed the procedure including the risks, benefits and alternatives for the proposed anesthesia with the patient or authorized representative who has indicated his/her understanding and acceptance.       Plan Discussed with: CRNA  Anesthesia Plan Comments:         Anesthesia Quick Evaluation

## 2020-10-31 NOTE — Transfer of Care (Signed)
Immediate Anesthesia Transfer of Care Note  Patient: Donna Fisher  Procedure(s) Performed: CATARACT EXTRACTION PHACO AND INTRAOCULAR LENS PLACEMENT (IOC) LEFT DIABETIC 8.30 01:14.8 11.1% (Left Eye)  Patient Location: PACU  Anesthesia Type: MAC  Level of Consciousness: awake, alert  and patient cooperative  Airway and Oxygen Therapy: Patient Spontanous Breathing and Patient connected to supplemental oxygen  Post-op Assessment: Post-op Vital signs reviewed, Patient's Cardiovascular Status Stable, Respiratory Function Stable, Patent Airway and No signs of Nausea or vomiting  Post-op Vital Signs: Reviewed and stable  Complications: No complications documented.
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
## 2020-11-03 ENCOUNTER — Inpatient Hospital Stay: Payer: Medicare HMO

## 2020-11-03 ENCOUNTER — Inpatient Hospital Stay
Admission: EM | Admit: 2020-11-03 | Discharge: 2020-11-06 | DRG: 854 | Disposition: A | Discharge status: Home Health | Payer: Medicare HMO | Source: Ambulatory Visit | Attending: Internal Medicine | Admitting: Internal Medicine

## 2020-11-03 ENCOUNTER — Encounter: Payer: Self-pay | Admitting: Ophthalmology

## 2020-11-03 ENCOUNTER — Other Ambulatory Visit: Payer: Self-pay

## 2020-11-03 DIAGNOSIS — E785 Hyperlipidemia, unspecified: Secondary | ICD-10-CM | POA: Diagnosis present

## 2020-11-03 DIAGNOSIS — E1169 Type 2 diabetes mellitus with other specified complication: Secondary | ICD-10-CM | POA: Diagnosis present

## 2020-11-03 DIAGNOSIS — E1151 Type 2 diabetes mellitus with diabetic peripheral angiopathy without gangrene: Secondary | ICD-10-CM | POA: Diagnosis present

## 2020-11-03 DIAGNOSIS — A419 Sepsis, unspecified organism: Principal | ICD-10-CM | POA: Diagnosis present

## 2020-11-03 DIAGNOSIS — E1122 Type 2 diabetes mellitus with diabetic chronic kidney disease: Secondary | ICD-10-CM | POA: Diagnosis present

## 2020-11-03 DIAGNOSIS — Z885 Allergy status to narcotic agent status: Secondary | ICD-10-CM

## 2020-11-03 DIAGNOSIS — E11621 Type 2 diabetes mellitus with foot ulcer: Secondary | ICD-10-CM | POA: Diagnosis present

## 2020-11-03 DIAGNOSIS — E1136 Type 2 diabetes mellitus with diabetic cataract: Secondary | ICD-10-CM | POA: Diagnosis present

## 2020-11-03 DIAGNOSIS — N1832 Chronic kidney disease, stage 3b: Secondary | ICD-10-CM | POA: Diagnosis present

## 2020-11-03 DIAGNOSIS — I129 Hypertensive chronic kidney disease with stage 1 through stage 4 chronic kidney disease, or unspecified chronic kidney disease: Secondary | ICD-10-CM | POA: Diagnosis present

## 2020-11-03 DIAGNOSIS — Z89412 Acquired absence of left great toe: Secondary | ICD-10-CM

## 2020-11-03 DIAGNOSIS — E1165 Type 2 diabetes mellitus with hyperglycemia: Secondary | ICD-10-CM | POA: Diagnosis present

## 2020-11-03 DIAGNOSIS — E872 Acidosis: Secondary | ICD-10-CM | POA: Diagnosis present

## 2020-11-03 DIAGNOSIS — F17210 Nicotine dependence, cigarettes, uncomplicated: Secondary | ICD-10-CM | POA: Diagnosis present

## 2020-11-03 DIAGNOSIS — Z880 Allergy status to penicillin: Secondary | ICD-10-CM | POA: Diagnosis not present

## 2020-11-03 DIAGNOSIS — N393 Stress incontinence (female) (male): Secondary | ICD-10-CM | POA: Diagnosis present

## 2020-11-03 DIAGNOSIS — L97509 Non-pressure chronic ulcer of other part of unspecified foot with unspecified severity: Secondary | ICD-10-CM

## 2020-11-03 DIAGNOSIS — Z89422 Acquired absence of other left toe(s): Secondary | ICD-10-CM

## 2020-11-03 DIAGNOSIS — F32A Depression, unspecified: Secondary | ICD-10-CM | POA: Diagnosis present

## 2020-11-03 DIAGNOSIS — M86171 Other acute osteomyelitis, right ankle and foot: Secondary | ICD-10-CM | POA: Diagnosis present

## 2020-11-03 DIAGNOSIS — Z794 Long term (current) use of insulin: Secondary | ICD-10-CM

## 2020-11-03 DIAGNOSIS — Z8249 Family history of ischemic heart disease and other diseases of the circulatory system: Secondary | ICD-10-CM

## 2020-11-03 DIAGNOSIS — Z888 Allergy status to other drugs, medicaments and biological substances status: Secondary | ICD-10-CM

## 2020-11-03 DIAGNOSIS — Z20822 Contact with and (suspected) exposure to covid-19: Secondary | ICD-10-CM | POA: Diagnosis present

## 2020-11-03 DIAGNOSIS — N179 Acute kidney failure, unspecified: Secondary | ICD-10-CM | POA: Diagnosis present

## 2020-11-03 DIAGNOSIS — M869 Osteomyelitis, unspecified: Secondary | ICD-10-CM

## 2020-11-03 DIAGNOSIS — L02611 Cutaneous abscess of right foot: Secondary | ICD-10-CM | POA: Diagnosis present

## 2020-11-03 DIAGNOSIS — Z720 Tobacco use: Secondary | ICD-10-CM | POA: Diagnosis not present

## 2020-11-03 DIAGNOSIS — I9589 Other hypotension: Secondary | ICD-10-CM | POA: Diagnosis present

## 2020-11-03 DIAGNOSIS — Z7984 Long term (current) use of oral hypoglycemic drugs: Secondary | ICD-10-CM

## 2020-11-03 DIAGNOSIS — L97418 Non-pressure chronic ulcer of right heel and midfoot with other specified severity: Secondary | ICD-10-CM

## 2020-11-03 DIAGNOSIS — Z7982 Long term (current) use of aspirin: Secondary | ICD-10-CM

## 2020-11-03 DIAGNOSIS — E1140 Type 2 diabetes mellitus with diabetic neuropathy, unspecified: Secondary | ICD-10-CM | POA: Diagnosis present

## 2020-11-03 DIAGNOSIS — H2512 Age-related nuclear cataract, left eye: Secondary | ICD-10-CM | POA: Diagnosis present

## 2020-11-03 DIAGNOSIS — L97416 Non-pressure chronic ulcer of right heel and midfoot with bone involvement without evidence of necrosis: Secondary | ICD-10-CM | POA: Diagnosis present

## 2020-11-03 DIAGNOSIS — I959 Hypotension, unspecified: Secondary | ICD-10-CM | POA: Diagnosis not present

## 2020-11-03 DIAGNOSIS — Z79899 Other long term (current) drug therapy: Secondary | ICD-10-CM

## 2020-11-03 DIAGNOSIS — Z8673 Personal history of transient ischemic attack (TIA), and cerebral infarction without residual deficits: Secondary | ICD-10-CM

## 2020-11-03 DIAGNOSIS — Z833 Family history of diabetes mellitus: Secondary | ICD-10-CM

## 2020-11-03 DIAGNOSIS — F411 Generalized anxiety disorder: Secondary | ICD-10-CM | POA: Diagnosis present

## 2020-11-03 DIAGNOSIS — L97419 Non-pressure chronic ulcer of right heel and midfoot with unspecified severity: Secondary | ICD-10-CM | POA: Diagnosis not present

## 2020-11-03 DIAGNOSIS — Z89411 Acquired absence of right great toe: Secondary | ICD-10-CM

## 2020-11-03 LAB — URINALYSIS, COMPLETE (UACMP) WITH MICROSCOPIC
Bilirubin Urine: NEGATIVE
Glucose, UA: >=500 mg/dL — AB
Hgb urine dipstick: NEGATIVE
Ketones, ur: NEGATIVE mg/dL
Leukocytes,Ua: NEGATIVE
Nitrite: NEGATIVE
Protein, ur: 30 mg/dL — AB
Specific Gravity, Urine: 1.029 (ref 1.005–1.030)
Squamous Epithelial / HPF: >50 — ABNORMAL HIGH (ref 0–5)
pH: 5 (ref 5.0–8.0)

## 2020-11-03 LAB — COMPREHENSIVE METABOLIC PANEL
ALT: 13 U/L (ref 0–44)
AST: 31 U/L (ref 15–41)
Albumin: 3.4 g/dL — ABNORMAL LOW (ref 3.5–5.0)
Alkaline Phosphatase: 92 U/L (ref 38–126)
Anion gap: 12 (ref 5–15)
BUN: 26 mg/dL — ABNORMAL HIGH (ref 6–20)
CO2: 22 mmol/L (ref 22–32)
Calcium: 8.6 mg/dL — ABNORMAL LOW (ref 8.9–10.3)
Chloride: 103 mmol/L (ref 98–111)
Creatinine, Ser: 1.83 mg/dL — ABNORMAL HIGH (ref 0.44–1.00)
GFR, Estimated: 32 mL/min — ABNORMAL LOW (ref >60)
Glucose, Bld: 123 mg/dL — ABNORMAL HIGH (ref 70–99)
Potassium: 5 mmol/L (ref 3.5–5.1)
Sodium: 137 mmol/L (ref 135–145)
Total Bilirubin: 1.2 mg/dL (ref 0.3–1.2)
Total Protein: 8.7 g/dL — ABNORMAL HIGH (ref 6.5–8.1)

## 2020-11-03 LAB — CBC WITH DIFFERENTIAL/PLATELET
Abs Immature Granulocytes: 0.06 10*3/uL (ref 0.00–0.07)
Basophils Absolute: 0.1 10*3/uL (ref 0.0–0.1)
Basophils Relative: 0 %
Eosinophils Absolute: 0.3 10*3/uL (ref 0.0–0.5)
Eosinophils Relative: 2 %
HCT: 39.4 % (ref 36.0–46.0)
Hemoglobin: 12.8 g/dL (ref 12.0–15.0)
Immature Granulocytes: 0 %
Lymphocytes Relative: 6 %
Lymphs Abs: 1 10*3/uL (ref 0.7–4.0)
MCH: 30.9 pg (ref 26.0–34.0)
MCHC: 32.5 g/dL (ref 30.0–36.0)
MCV: 95.2 fL (ref 80.0–100.0)
Monocytes Absolute: 0.7 10*3/uL (ref 0.1–1.0)
Monocytes Relative: 5 %
Neutro Abs: 13.5 10*3/uL — ABNORMAL HIGH (ref 1.7–7.7)
Neutrophils Relative %: 87 %
Platelets: 270 10*3/uL (ref 150–400)
RBC: 4.14 MIL/uL (ref 3.87–5.11)
RDW: 13.2 % (ref 11.5–15.5)
WBC: 15.6 10*3/uL — ABNORMAL HIGH (ref 4.0–10.5)
nRBC: 0 % (ref 0.0–0.2)

## 2020-11-03 LAB — APTT: aPTT: 32 seconds (ref 24–36)

## 2020-11-03 LAB — LACTIC ACID, PLASMA: Lactic Acid, Venous: 1.6 mmol/L (ref 0.5–1.9)

## 2020-11-03 LAB — PROTIME-INR
INR: 1.1 (ref 0.8–1.2)
Prothrombin Time: 14.4 seconds (ref 11.4–15.2)

## 2020-11-03 LAB — RESP PANEL BY RT-PCR (FLU A&B, COVID) ARPGX2
Influenza A by PCR: NEGATIVE
Influenza B by PCR: NEGATIVE
SARS Coronavirus 2 by RT PCR: NEGATIVE

## 2020-11-03 LAB — GLUCOSE, CAPILLARY: Glucose-Capillary: 123 mg/dL — ABNORMAL HIGH (ref 70–99)

## 2020-11-03 MED ORDER — SODIUM CHLORIDE 0.9 % IV BOLUS (SEPSIS)
1000.0000 mL | Freq: Once | INTRAVENOUS | Status: AC
  Administered 2020-11-03: 1000 mL via INTRAVENOUS

## 2020-11-03 MED ORDER — EZETIMIBE 10 MG PO TABS
10.0000 mg | ORAL_TABLET | Freq: Every day | ORAL | Status: DC
  Administered 2020-11-04 – 2020-11-06 (×2): 10 mg via ORAL
  Filled 2020-11-03 (×3): qty 1

## 2020-11-03 MED ORDER — AMITRIPTYLINE HCL 25 MG PO TABS: 25.0000 mg | ORAL_TABLET | Freq: Every evening | ORAL | Status: DC | PRN

## 2020-11-03 MED ORDER — POLYETHYLENE GLYCOL 3350 17 G PO PACK: 17.0000 g | PACK | Freq: Every day | ORAL | Status: DC | PRN

## 2020-11-03 MED ORDER — INSULIN ASPART 100 UNIT/ML ~~LOC~~ SOLN
0.0000 [IU] | SUBCUTANEOUS | Status: DC
  Administered 2020-11-04: 1 [IU] via SUBCUTANEOUS
  Administered 2020-11-04: 4 [IU] via SUBCUTANEOUS
  Administered 2020-11-04: 3 [IU] via SUBCUTANEOUS
  Administered 2020-11-05: 4 [IU] via SUBCUTANEOUS
  Administered 2020-11-05: 1 [IU] via SUBCUTANEOUS
  Administered 2020-11-06: 6 [IU] via SUBCUTANEOUS
  Administered 2020-11-06: 5 [IU] via SUBCUTANEOUS
  Administered 2020-11-06: 6 [IU] via SUBCUTANEOUS
  Filled 2020-11-03 (×10): qty 1

## 2020-11-03 MED ORDER — VALACYCLOVIR HCL 500 MG PO TABS
1000.0000 mg | ORAL_TABLET | Freq: Every day | ORAL | Status: DC
  Filled 2020-11-03 (×3): qty 2

## 2020-11-03 MED ORDER — VANCOMYCIN HCL 1250 MG/250ML IV SOLN: 1250.0000 mg | INTRAVENOUS | Status: DC

## 2020-11-03 MED ORDER — MIDODRINE HCL 5 MG PO TABS
10.0000 mg | ORAL_TABLET | Freq: Three times a day (TID) | ORAL | Status: DC | PRN
  Filled 2020-11-03: qty 2

## 2020-11-03 MED ORDER — FESOTERODINE FUMARATE ER 4 MG PO TB24
4.0000 mg | ORAL_TABLET | Freq: Every day | ORAL | Status: DC
  Administered 2020-11-06: 4 mg via ORAL
  Filled 2020-11-03 (×3): qty 1

## 2020-11-03 MED ORDER — VANCOMYCIN HCL IN DEXTROSE 1-5 GM/200ML-% IV SOLN
1000.0000 mg | Freq: Once | INTRAVENOUS | Status: AC
  Administered 2020-11-03: 1000 mg via INTRAVENOUS
  Filled 2020-11-03: qty 200

## 2020-11-03 MED ORDER — ACETAMINOPHEN 325 MG PO TABS: 650.0000 mg | ORAL_TABLET | Freq: Four times a day (QID) | ORAL | Status: DC | PRN

## 2020-11-03 MED ORDER — SODIUM CHLORIDE 0.9% FLUSH
3.0000 mL | Freq: Two times a day (BID) | INTRAVENOUS | Status: DC
  Administered 2020-11-03 – 2020-11-06 (×5): 3 mL via INTRAVENOUS

## 2020-11-03 MED ORDER — SODIUM CHLORIDE 0.9 % IV SOLN
2.0000 g | Freq: Three times a day (TID) | INTRAVENOUS | Status: DC
  Administered 2020-11-03 – 2020-11-06 (×8): 2 g via INTRAVENOUS
  Filled 2020-11-03 (×11): qty 2

## 2020-11-03 MED ORDER — SODIUM CHLORIDE 0.9 % IV SOLN
2.0000 g | Freq: Once | INTRAVENOUS | Status: AC
Start: 2020-11-03 — End: 2020-11-03
  Administered 2020-11-03: 2 g via INTRAVENOUS
  Filled 2020-11-03: qty 2

## 2020-11-03 MED ORDER — OXYCODONE HCL 5 MG PO TABS
5.0000 mg | ORAL_TABLET | ORAL | Status: AC | PRN
  Administered 2020-11-03 – 2020-11-04 (×2): 5 mg via ORAL
  Filled 2020-11-03 (×2): qty 1

## 2020-11-03 MED ORDER — ONDANSETRON HCL 4 MG/2ML IJ SOLN
4.0000 mg | Freq: Once | INTRAMUSCULAR | Status: AC
  Administered 2020-11-03: 4 mg via INTRAVENOUS
  Filled 2020-11-03: qty 2

## 2020-11-03 MED ORDER — ENOXAPARIN SODIUM 60 MG/0.6ML ~~LOC~~ SOLN
0.5000 mg/kg | SUBCUTANEOUS | Status: DC
  Administered 2020-11-03 – 2020-11-05 (×3): 45 mg via SUBCUTANEOUS
  Filled 2020-11-03 (×3): qty 0.6

## 2020-11-03 MED ORDER — INSULIN DETEMIR 100 UNIT/ML ~~LOC~~ SOLN
20.0000 [IU] | Freq: Two times a day (BID) | SUBCUTANEOUS | Status: DC
  Administered 2020-11-04 – 2020-11-05 (×4): 20 [IU] via SUBCUTANEOUS
  Filled 2020-11-03 (×7): qty 0.2

## 2020-11-03 MED ORDER — LIRAGLUTIDE 18 MG/3ML ~~LOC~~ SOPN: 1.8000 mg | PEN_INJECTOR | Freq: Every day | SUBCUTANEOUS | Status: DC

## 2020-11-03 MED ORDER — HYDROMORPHONE HCL 1 MG/ML IJ SOLN
1.0000 mg | Freq: Once | INTRAMUSCULAR | Status: AC
Start: 2020-11-03 — End: 2020-11-03
  Administered 2020-11-03: 1 mg via INTRAVENOUS
  Filled 2020-11-03: qty 1

## 2020-11-03 MED ORDER — HYDROXYZINE HCL 25 MG PO TABS
25.0000 mg | ORAL_TABLET | Freq: Three times a day (TID) | ORAL | Status: DC | PRN
  Filled 2020-11-03: qty 1

## 2020-11-03 MED ORDER — NICOTINE 21 MG/24HR TD PT24
21.0000 mg | MEDICATED_PATCH | Freq: Every day | TRANSDERMAL | Status: DC
  Filled 2020-11-03 (×3): qty 1

## 2020-11-03 MED ORDER — SODIUM CHLORIDE 0.9 % IV SOLN: INTRAVENOUS | Status: DC

## 2020-11-03 MED ORDER — LORAZEPAM 0.5 MG PO TABS
0.5000 mg | ORAL_TABLET | Freq: Two times a day (BID) | ORAL | Status: DC
  Administered 2020-11-03 – 2020-11-06 (×4): 0.5 mg via ORAL
  Filled 2020-11-03 (×4): qty 1

## 2020-11-03 MED ORDER — VANCOMYCIN HCL 750 MG IV SOLR
750.0000 mg | INTRAVENOUS | Status: DC
  Administered 2020-11-04: 750 mg via INTRAVENOUS
  Filled 2020-11-03 (×2): qty 750

## 2020-11-03 MED ORDER — GABAPENTIN 300 MG PO CAPS
600.0000 mg | ORAL_CAPSULE | Freq: Two times a day (BID) | ORAL | Status: DC
  Administered 2020-11-03 – 2020-11-06 (×5): 600 mg via ORAL
  Filled 2020-11-03 (×5): qty 2

## 2020-11-03 MED ORDER — ENOXAPARIN SODIUM 40 MG/0.4ML ~~LOC~~ SOLN: 40.0000 mg | SUBCUTANEOUS | Status: DC

## 2020-11-03 NOTE — ED Triage Notes (Signed)
Pt was sent over for on going infection in the right lateral foot for the past 2 months, states over the weekend she started having fever and chills with foul odor from the wound

## 2020-11-03 NOTE — Consult Note (Signed)
Pharmacy Antibiotic Note  Nolee Licano Kieran is a 55 y.o. female admitted on 11/03/2020 with Right foot infection. Patient with PMH significant for DM and prior toe amputations to RLE.  Pharmacy has been consulted for Vancomycin and aztreonam dosing. 4/18 Xray: + for osteomyelitis  Patient developed hives and mild throat swelling with oral penicillin requiring use of Epi-pen and ED visit. Patient received ceftriaxone in 2018 and developed a rash with associated skin peeling  Plan:  Will give additional vancomycin 1000 mg dose for total LD of 2000 mg x 1  Maintenance regimen of 750 mg Q24H. Goal AUC: 400-600  Estiamted AUC: 555  SCr used: 1.83  Aztreonam 2 gram Q8H  Follow up cultures/source control plans  Monitor renal function daily  Height: '5\' 4"'$  (162.6 cm) Weight: 91.2 kg (201 lb) IBW/kg (Calculated) : 54.7  Temp (24hrs), Avg:98.3 F (36.8 C), Min:98.1 F (36.7 C), Max:98.7 F (37.1 C)  Recent Labs  Lab 11/03/20 1406 11/03/20 1424  WBC 15.6*  --   CREATININE 1.83*  --   LATICACIDVEN  --  1.6    Estimated Creatinine Clearance: 38.4 mL/min (A) (by C-G formula based on SCr of 1.83 mg/dL (H)).    Allergies  Allergen Reactions  . Atorvastatin   . Penicillins Swelling    .Has patient had a PCN reaction causing immediate rash, facial/tongue/throat swelling, SOB or lightheadedness with hypotension: No Has patient had a PCN reaction causing severe rash involving mucus membranes or skin necrosis: No Has patient had a PCN reaction that required hospitalization: No Has patient had a PCN reaction occurring within the last 10 years: No If all of the above answers are "NO", then may proceed with Cephalosporin use.  .Has patient had a PCN reaction causing immediate rash, facial/tongue/throat swelling, SOB or lightheadedness with hypotension: No Has patient had a PCN reaction causing severe rash involving mucus membranes or skin necrosis: No Has patient had a PCN reaction that  required hospitalization: No Has patient had a PCN reaction occurring within the last 10 years: No If all of the above answers are "NO", then may proceed with Cephalosporin use. .Has patient had a PCN reaction causing immediate rash, facial/tongue/throat swelling, SOB or lightheadedness with hypotension: No Has patient had a PCN reaction causing severe rash involving mucus membranes or skin necrosis: No Has patient had a PCN reaction that required hospitalization: No Has patient had a PCN reaction occurring within the last 10 years: No If all of the above answers are "NO", then may proceed with Cephalosporin use.  . Codeine Itching  . Ceftriaxone Rash    Antimicrobials this admission: 4/18 Vancomycin >>  4/18 Aztreonam >>   Dose adjustments this admission: n/a  Microbiology results: 4/18 BCx: sent 4/48 UCx: sent   Thank you for allowing pharmacy to be a part of this patient's care.  Dorothe Pea, PharmD, BCPS 11/03/2020 6:52 PM

## 2020-11-03 NOTE — Progress Notes (Signed)
PHARMACY -  BRIEF ANTIBIOTIC NOTE   Pharmacy has received consult(s) for vancomycin and aztreonam from an ED provider.  The patient's profile has been reviewed for ht/wt/allergies/indication/available labs.    Antibiotic indication is cellulitis. Labs are still pending. Patient has a PCN allergy (swelling). Patient has received ceftriaxone in our system but appears she developed rash. Discussed further with patient. Patient developed hives and mild throat swelling with oral penicillin requiring use of Epi-pen and ED visit. Patient received ceftriaxone in 2018 and developed a rash with associated skin peeling.   One time order(s) placed for  --Vancomycin 1 g --Aztreonam 2 g  Further antibiotics/pharmacy consults should be ordered by admitting physician if indicated.                       Thank you, Benita Gutter 11/03/2020  2:15 PM

## 2020-11-03 NOTE — Hospital Course (Signed)
55 year old white female NIDDM Diabetic neuropathy nephropathy Anxiety Chronic prior ulcerations right first second toes status post angiogram 01/2019 with amputation of the same at that time Osteomyelitis of left hallux 05/29/2019 Baseline CKD 3B likely secondary to diabetes/HTN PAD on Plavix   On arrival comes to [facility] ED with foot pain foul smell Found to have lactic acid 1.6 BUN/creatinine 23/1.7->26/1.8 Potassium 5.0 White count 15.6  Code sepsis called Started on Azactam vancomycin weight-based bolus NS given kept n.p.o.
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
## 2020-11-03 NOTE — Progress Notes (Signed)
CODE SEPSIS - PHARMACY COMMUNICATION  **Broad Spectrum Antibiotics should be administered within 1 hour of Sepsis diagnosis**  Time Code Sepsis Called/Page Received: 1406  Antibiotics Ordered: Aztreonam / vancomycin  Time of 1st antibiotic administration: 1500  Additional action taken by pharmacy: N/A  Benita Gutter 11/03/2020  2:25 PM

## 2020-11-03 NOTE — Consult Note (Signed)
Brief note. Pt seen in outpt clinic with worsening diabetic foot infection. Will order xray and MRI. Will plan to evaluate in am for further recommendations.

## 2020-11-03 NOTE — Progress Notes (Signed)
PHARMACIST - PHYSICIAN COMMUNICATION  CONCERNING:  Enoxaparin (Lovenox) for DVT Prophylaxis    RECOMMENDATION: Patient was prescribed enoxaprin '40mg'$  q24 hours for VTE prophylaxis.   Filed Weights   11/03/20 1401  Weight: 91.2 kg (201 lb)    Body mass index is 34.5 kg/m.  Estimated Creatinine Clearance: 38.4 mL/min (A) (by C-G formula based on SCr of 1.83 mg/dL (H)).   Based on [place] patient is candidate for enoxaparin 0.'5mg'$ /kg TBW SQ every 24 hours based on BMI being >30.  DESCRIPTION: Pharmacy has adjusted enoxaparin dose per [facility] policy.  Patient is now receiving enoxaparin 45 mg every 24 hours    Berta Minor, PharmD Clinical Pharmacist  11/03/2020 4:53 PM
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
## 2020-11-04 LAB — GLUCOSE, CAPILLARY
Glucose-Capillary: 123 mg/dL — ABNORMAL HIGH (ref 70–99)
Glucose-Capillary: 125 mg/dL — ABNORMAL HIGH (ref 70–99)
Glucose-Capillary: 157 mg/dL — ABNORMAL HIGH (ref 70–99)
Glucose-Capillary: 281 mg/dL — ABNORMAL HIGH (ref 70–99)
Glucose-Capillary: 311 mg/dL — ABNORMAL HIGH (ref 70–99)
Glucose-Capillary: 327 mg/dL — ABNORMAL HIGH (ref 70–99)

## 2020-11-04 LAB — CBC
HCT: 33.4 % — ABNORMAL LOW (ref 36.0–46.0)
Hemoglobin: 11 g/dL — ABNORMAL LOW (ref 12.0–15.0)
MCH: 31.8 pg (ref 26.0–34.0)
MCHC: 32.9 g/dL (ref 30.0–36.0)
MCV: 96.5 fL (ref 80.0–100.0)
Platelets: 303 10*3/uL (ref 150–400)
RBC: 3.46 MIL/uL — ABNORMAL LOW (ref 3.87–5.11)
RDW: 13.2 % (ref 11.5–15.5)
WBC: 9.4 10*3/uL (ref 4.0–10.5)
nRBC: 0 % (ref 0.0–0.2)

## 2020-11-04 LAB — COMPREHENSIVE METABOLIC PANEL
ALT: 11 U/L (ref 0–44)
AST: 18 U/L (ref 15–41)
Albumin: 2.7 g/dL — ABNORMAL LOW (ref 3.5–5.0)
Alkaline Phosphatase: 79 U/L (ref 38–126)
Anion gap: 6 (ref 5–15)
BUN: 27 mg/dL — ABNORMAL HIGH (ref 6–20)
CO2: 24 mmol/L (ref 22–32)
Calcium: 8.1 mg/dL — ABNORMAL LOW (ref 8.9–10.3)
Chloride: 109 mmol/L (ref 98–111)
Creatinine, Ser: 1.68 mg/dL — ABNORMAL HIGH (ref 0.44–1.00)
GFR, Estimated: 36 mL/min — ABNORMAL LOW (ref >60)
Glucose, Bld: 130 mg/dL — ABNORMAL HIGH (ref 70–99)
Potassium: 4.3 mmol/L (ref 3.5–5.1)
Sodium: 139 mmol/L (ref 135–145)
Total Bilirubin: 0.7 mg/dL (ref 0.3–1.2)
Total Protein: 7.3 g/dL (ref 6.5–8.1)

## 2020-11-04 LAB — HEMOGLOBIN A1C
Hgb A1c MFr Bld: 8.7 % — ABNORMAL HIGH (ref 4.8–5.6)
Mean Plasma Glucose: 202.99 mg/dL

## 2020-11-04 LAB — HIV ANTIBODY (ROUTINE TESTING W REFLEX): HIV Screen 4th Generation wRfx: NONREACTIVE

## 2020-11-04 MED ORDER — NEPAFENAC 0.3 % OP SUSP
1.0000 [drp] | Freq: Every day | OPHTHALMIC | Status: DC
  Administered 2020-11-04 – 2020-11-05 (×2): 1 [drp] via OPHTHALMIC
  Filled 2020-11-04: qty 3

## 2020-11-04 MED ORDER — OXYCODONE HCL 5 MG PO TABS
5.0000 mg | ORAL_TABLET | Freq: Four times a day (QID) | ORAL | Status: DC | PRN
  Administered 2020-11-04 – 2020-11-05 (×2): 5 mg via ORAL
  Filled 2020-11-04 (×2): qty 1

## 2020-11-04 MED ORDER — CHLORHEXIDINE GLUCONATE 4 % EX LIQD
60.0000 mL | Freq: Once | CUTANEOUS | Status: AC
  Administered 2020-11-05: 4 via TOPICAL

## 2020-11-04 MED ORDER — DIFLUPREDNATE 0.05 % OP EMUL
1.0000 [drp] | Freq: Two times a day (BID) | OPHTHALMIC | Status: DC
  Administered 2020-11-04 – 2020-11-06 (×4): 1 [drp] via OPHTHALMIC
  Filled 2020-11-04: qty 5

## 2020-11-04 MED ORDER — POVIDONE-IODINE 10 % EX SWAB: 2.0000 "application " | Freq: Once | CUTANEOUS | Status: DC

## 2020-11-04 MED ORDER — MOXIFLOXACIN HCL 0.5 % OP SOLN
1.0000 [drp] | Freq: Four times a day (QID) | OPHTHALMIC | Status: DC
  Administered 2020-11-04 – 2020-11-06 (×6): 1 [drp] via OPHTHALMIC
  Filled 2020-11-04: qty 3

## 2020-11-04 MED ORDER — METRONIDAZOLE IN NACL 5-0.79 MG/ML-% IV SOLN
500.0000 mg | Freq: Three times a day (TID) | INTRAVENOUS | Status: DC
  Administered 2020-11-04 – 2020-11-06 (×7): 500 mg via INTRAVENOUS
  Filled 2020-11-04 (×10): qty 100

## 2020-11-04 MED ORDER — GATIFLOXACIN 0.5 % OP SOLN
1.0000 [drp] | Freq: Three times a day (TID) | OPHTHALMIC | Status: DC
  Filled 2020-11-04: qty 2.5

## 2020-11-04 MED ORDER — PREDNISOLONE ACETATE 1 % OP SUSP
1.0000 [drp] | Freq: Two times a day (BID) | OPHTHALMIC | Status: DC
  Filled 2020-11-04: qty 1

## 2020-11-04 NOTE — TOC Initial Note (Signed)
Transition of Care [facility]) - Initial/Assessment Note    Patient Details  Name: Donna Fisher MRN: [id number] Date of Birth: Mar 29, 1966  [facility] CM/SW Contact:    Su Hilt, RN Phone Number: 11/04/2020, 2:31 PM  Clinical Narrative:       The patient lives at home with her husband, having a scheduled Debridement tomorrow and will likely need Long term IV ABX, Notified [place] with [facility] of the need for RN and PT I notified Carolynn Sayers at [facility] of the potential for IV ABX     The patient would like a rollator, I notified Rhonda at [facility]              Patient Goals and CMS Choice        Expected Discharge Plan and Services                                                Prior Living Arrangements/Services                       Activities of Daily Living Home Assistive Devices/Equipment: None ADL Screening (condition at time of admission) Patient's cognitive ability adequate to safely complete daily activities?: Yes Is the patient deaf or have difficulty hearing?: Yes Does the patient have difficulty seeing, even when wearing glasses/contacts?: No Does the patient have difficulty concentrating, remembering, or making decisions?: No Patient able to express need for assistance with ADLs?: Yes Does the patient have difficulty dressing or bathing?: No Independently performs ADLs?: Yes (appropriate for developmental age) Does the patient have difficulty walking or climbing stairs?: No Weakness of Legs: Both Weakness of Arms/Hands: Both  Permission Sought/Granted                  Emotional Assessment              Admission diagnosis:  Diabetic foot ulcer ([place]) [id number], L97.509] Sepsis ([place]) [A41.9] Diabetic ulcer of right midfoot associated with type 2 diabetes mellitus, unspecified ulcer stage ([place]) [id number], L97.419] Patient Active Problem List   Diagnosis Date Noted  . Sepsis ([place]) 11/03/2020   . History of hypotension 05/28/2019  . Osteomyelitis ([place]) 05/27/2019  . Type 2 diabetes mellitus with diabetic peripheral angiopathy with gangrene ([place]) 01/15/2019  . Gangrene ([place]) 01/15/2019  . Anticoagulant long-term use 12/28/2018  . Chronic idiopathic constipation 12/28/2018  . 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 Other constipation 12/28/2018  . Change in bowel habits 11/23/2018  . 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 PAD (peripheral artery disease) ([place]) 11/22/2018  . Atherosclerosis of native arteries of extremity with intermittent claudication ([place]) 11/22/2018  . Diabetes mellitus type 2 with complications ([place]) [id number]  . Hypotension 06/20/2018  . Smoker 06/20/2018  . Hyperlipidemia 06/20/2018  . Cellulitis of left foot 12/31/2016  . Headache 02/06/2013   PCP:  Frazier Richards, MD Pharmacy:   [facility] [address] [place] [address] Phone: [phone] Fax: [place], [place] [facility] [address] [place] [address] Phone: [phone] Fax: [phone]     Social Determinants of Health (SDOH) Interventions    Readmission Risk Interventions No flowsheet data found.

## 2020-11-04 NOTE — Consult Note (Signed)
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 swelling and edema. No organized fluid collection. IMPRESSION: 1. Soft tissue ulceration at the lateral aspect of the forefoot adjacent to the level of the fifth MTP joint. Acute osteomyelitis of the fifth metatarsal and fifth toe proximal phalanx, as described above. 2. Small fifth MTP joint effusion compatible with septic arthritis. 3. Mild subcortical marrow edema within the fourth metatarsal  head. This may represent reactive osteitis versus early osteomyelitis. 4. Marked tendinosis of the flexor hallucis longus tendon within the forefoot. 5. Mild diffuse edema-like signal of the intrinsic foot musculature which may reflect denervation changes and/or myositis. Findings are in agreement with the preliminary report provided by Dr. Marguerita Merles. The findings were communicated to Dr Myna Hidalgo by Dr. Marguerita Merles by telephone at the time of preliminary report. Electronically Signed   By: Davina Poke D.O.   On: 11/04/2020 08:21   DG Foot Complete Right  Result Date: 11/03/2020 CLINICAL DATA:  Foot infection laterally for several months. EXAM: RIGHT FOOT COMPLETE - 3+ VIEW COMPARISON:  12/25/2015 FINDINGS: There is been interval amputation of the first toe in the midportion of the proximal phalanx as well as the entire second toe. Fragmentation in the distal aspect of  the fifth metatarsal is noted with associated overlying soft tissue wound. Similar findings are noted at the base of the fifth proximal phalanx with erosive change and fragmentation. These changes are consistent with underlying osteomyelitis. IMPRESSION: Osteomyelitis in the distal aspect of the fifth metatarsal as well as the fifth proximal phalanx Electronically Signed   By: Inez Catalina M.D.   On: 11/03/2020 16:53    Assessment:   Donna Fisher is a 55 y.o. female with poorly controlled DM, with PN with nonhealing R foot wound now progressed to infection with underlying osteo.  For surgery tomorrow.  Recommendations Will need picc Cont vanco aztreonam and add flaygl  Thank you very much for allowing me to participate in the care of this patient. Please call with questions.   Cheral Marker. Ola Spurr, MD

## 2020-11-04 NOTE — TOC Progression Note (Signed)
Transition of Care [facility]) - Progression Note    Patient Details  Name: Donna Fisher MRN: [id number] Date of Birth: September 29, 1965  [facility] CM/SW Contact  Su Hilt, RN Phone Number: 11/04/2020, 3:12 PM  Clinical Narrative:   [place] with [facility] notified me that they would not be able to accept the patient, [name] has agreed to accept the patient         Expected Discharge Plan and Services                                                 Social Determinants of Health (SDOH) Interventions    Readmission Risk Interventions No flowsheet data found.
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
## 2020-11-04 NOTE — Progress Notes (Signed)
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 Anxiety Chronic prior ulcerations right first second toes status post angiogram 01/2019 with amputation of the same at that time Osteomyelitis of left hallux 05/29/2019 Baseline CKD 3B likely secondary to diabetes/HTN PAD on Plavix  Recent cataract surgery 4 days prior-over the weekend increasing pain in right lower extremity Seen at podiatry office because of pain fever as well as oozing from right lateral foot  Patient admitted with frank purulence of foot foul smell lactic acidosis mild AKI and sepsis from infected foot Work-up revealed osteomyelitis  Hospital-Problem based course  Osteomyelitis R fifth MTPJ Definitive management Dr. Vickki Muff podiatrist-?  Surgery 4/20 Await ID Dr. Ola Spurr input regarding duration ABX/PICC line? 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 continue midodrine 10 3 times daily Recent cataract surgery Resume all drops [employer] Repatha, statin at this time Valacyclovir query indication query HSV Continue Detrol 4 urinary incontinence Continuing hydroxyzine    DVT prophylaxis: Lovenox Code Status: Full Family Communication: No family present today Disposition:  Status is: Inpatient-patient will need definitive surgery and will probably need another 48 to 72 hours in the hospital  Remains inpatient appropriate because:Hemodynamically unstable, Persistent severe electrolyte disturbances, Ongoing active pain requiring inpatient pain  management and Altered mental status   Dispo: The patient is from: Home              Anticipated d/c is to: Home              Patient currently is not medically stable to d/c.   Difficult to place patient No       Consultants:   Vickki Muff D.P.M.  [place] ID  Procedures: None yet  Antimicrobials: Azactam vancomycin   Subjective: Doing well Pain is 4/10 Placed on oxygen earlier today for unclear reason-nurse rechecking No fever chills at this time Tolerating diet  Objective: Vitals:   11/04/20 0004 11/04/20 0342 11/04/20 0414 11/04/20 0810  BP: 117/67  111/64 124/70  Pulse: 93 89 91 90  Resp: '17  13 18  '$ Temp: 98.2 F (36.8 C)  97.9 F (36.6 C) 98.7 F (37.1 C)  TempSrc: Oral  Oral   SpO2: 94% 95% 94% (!) 89%  Weight:      Height:        Intake/Output Summary (Last 24 hours) at 11/04/2020 1358 Last data filed at 11/03/2020 2318 Gross per 24 hour  Intake 1029.22 ml  Output --  Net 1029.22 ml   Filed Weights   11/03/20 1401  Weight: 91.2 kg    Examination:   EOMI NCAT no focal deficit Postoperative changes to both eyes Chest clear Abdomen soft No rebound no guarding Wound wrapped-wound not examined Psych euthymic  Data Reviewed: personally reviewed   CBC    Component Value Date/Time   WBC 9.4 11/04/2020 0421   RBC 3.46 (L) 11/04/2020 0421   HGB 11.0 (L) 11/04/2020 0421   HGB 12.2 05/10/2017 1001   HCT 33.4 (L) 11/04/2020 0421   HCT 35.5 05/10/2017 1001   PLT 303 11/04/2020 0421   PLT 267  05/10/2017 1001   MCV 96.5 11/04/2020 0421   MCV 88 05/10/2017 1001   MCV 90 11/03/2014 0521   MCH 31.8 11/04/2020 0421   MCHC 32.9 11/04/2020 0421   RDW 13.2 11/04/2020 0421   RDW 14.9 05/10/2017 1001   RDW 12.3 11/03/2014 0521   LYMPHSABS 1.0 11/03/2020 1406   LYMPHSABS 3.3 11/03/2014 0521   MONOABS 0.7 11/03/2020 1406   MONOABS 0.7 11/03/2014 0521   EOSABS 0.3 11/03/2020 1406   EOSABS 0.4 11/03/2014 0521   BASOSABS 0.1 11/03/2020 1406    BASOSABS 0.1 11/03/2014 0521   CMP Latest Ref Rng & Units 11/04/2020 11/03/2020 05/29/2019  Glucose 70 - 99 mg/dL 130(H) 123(H) 217(H)  BUN 6 - 20 mg/dL 27(H) 26(H) 23(H)  Creatinine 0.44 - 1.00 mg/dL 1.68(H) 1.83(H) 1.72(H)  Sodium 135 - 145 mmol/L 139 137 137  Potassium 3.5 - 5.1 mmol/L 4.3 5.0 4.6  Chloride 98 - 111 mmol/L 109 103 103  CO2 22 - 32 mmol/L '24 22 26  '$ Calcium 8.9 - 10.3 mg/dL 8.1(L) 8.6(L) 8.2(L)  Total Protein 6.5 - 8.1 g/dL 7.3 8.7(H) -  Total Bilirubin 0.3 - 1.2 mg/dL 0.7 1.2 -  Alkaline Phos 38 - 126 U/L 79 92 -  AST 15 - 41 U/L 18 31 -  ALT 0 - 44 U/L 11 13 -     Radiology Studies: MR FOOT RIGHT WO CONTRAST  Result Date: 11/04/2020 CLINICAL DATA:  Diabetic foot infection.  Osteomyelitis EXAM: MRI OF THE RIGHT FOREFOOT WITHOUT CONTRAST TECHNIQUE: Multiplanar, multisequence MR imaging of the right forefoot was performed. No intravenous contrast was administered. COMPARISON:  X-ray 11/03/2020 FINDINGS: Bones/Joint/Cartilage Acute osteomyelitis involving the fifth metatarsal head and neck with bony destruction and pathologic fracture. Extensive high T2/STIR signal throughout the fifth metatarsal diaphysis extending to the level of the proximal metaphysis with associated confluent low T1 signal change. Small fifth MTP joint effusion compatible with septic arthritis. There is also destruction and marrow replacement of the fifth toe proximal phalanx base and diaphysis. Fracture through the proximal metaphysis of the proximal phalanx, better seen radiographically. Preserved T1 marrow signal within the fifth toe proximal phalanx adjacent to the PIP joint. The middle and distal phalanx of the fifth toe are within normal limits. There is mild subcortical marrow edema within the fourth metatarsal head with preserved T1 marrow signal. Prior amputation of the second toe and great toe proximal phalanx. The remaining osseous structures are intact without fracture or malalignment. Ligaments  Intact Lisfranc ligament. Muscles and Tendons Mild diffuse edema-like signal of the intrinsic foot musculature which may reflect denervation changes and/or myositis. Fifth toe flexor tendon closely approximates the ulcer base at the level of the fifth MTP joint. Marked tendinosis of the flexor hallucis longus tendon within the forefoot. No tenosynovial fluid collection. Soft tissues Soft tissue ulceration at the lateral aspect of the forefoot adjacent to the level of the fifth MTP joint. Surrounding soft tissue swelling and edema. No organized fluid collection. IMPRESSION: 1. Soft tissue ulceration at the lateral aspect of the forefoot adjacent to the level of the fifth MTP joint. Acute osteomyelitis of the fifth metatarsal and fifth toe proximal phalanx, as described above. 2. Small fifth MTP joint effusion compatible with septic arthritis. 3. Mild subcortical marrow edema within the fourth metatarsal head. This may represent reactive osteitis versus early osteomyelitis. 4. Marked tendinosis of the flexor hallucis longus tendon within the forefoot. 5. Mild diffuse edema-like signal of the intrinsic foot musculature which may reflect denervation  changes and/or myositis. Findings are in agreement with the preliminary report provided by Dr. Marguerita Merles. The findings were communicated to Dr Myna Hidalgo by Dr. Marguerita Merles by telephone at the time of preliminary report. Electronically Signed   By: Davina Poke D.O.   On: 11/04/2020 08:21   DG Foot Complete Right  Result Date: 11/03/2020 CLINICAL DATA:  Foot infection laterally for several months. EXAM: RIGHT FOOT COMPLETE - 3+ VIEW COMPARISON:  12/25/2015 FINDINGS: There is been interval amputation of the first toe in the midportion of the proximal phalanx as well as the entire second toe. Fragmentation in the distal aspect of the fifth metatarsal is noted with associated overlying soft tissue wound. Similar findings are noted at the base of the fifth proximal phalanx with erosive  change and fragmentation. These changes are consistent with underlying osteomyelitis. IMPRESSION: Osteomyelitis in the distal aspect of the fifth metatarsal as well as the fifth proximal phalanx Electronically Signed   By: Inez Catalina M.D.   On: 11/03/2020 16:53     Scheduled Meds: . enoxaparin (LOVENOX) injection  0.5 mg/kg Subcutaneous Q24H  . ezetimibe  10 mg Oral Daily  . fesoterodine  4 mg Oral Daily  . gabapentin  600 mg Oral BID  . gatifloxacin  1 drop Left Eye TID AC & HS  . insulin aspart  0-6 Units Subcutaneous Q4H  . insulin detemir  20 Units Subcutaneous BID  . liraglutide  1.8 mg Subcutaneous QHS  . LORazepam  0.5 mg Oral BID  . nicotine  21 mg Transdermal Daily  . prednisoLONE acetate  1 drop Left Eye BID  . sodium chloride flush  3 mL Intravenous Q12H  . valACYclovir  1,000 mg Oral Daily   Continuous Infusions: . sodium chloride 75 mL/hr at 11/04/20 0958  . aztreonam 2 g (11/04/20 1246)  . vancomycin       LOS: 1 day   Time spent: 30 minutes  Nita Sells, MD [facility] To contact the attending provider between 7A-7P or the covering provider during after hours 7P-7A, please log into the web site [email or web address] and access using universal [place] password for that web site. If you do not have the password, please call the hospital operator.  11/04/2020, 1:58 PM

## 2020-11-05 ENCOUNTER — Inpatient Hospital Stay: Payer: Self-pay

## 2020-11-05 ENCOUNTER — Inpatient Hospital Stay: Payer: Medicare HMO | Admitting: Certified Registered Nurse Anesthetist

## 2020-11-05 ENCOUNTER — Inpatient Hospital Stay: Payer: Medicare HMO

## 2020-11-05 ENCOUNTER — Encounter: Payer: Self-pay | Admitting: Family Medicine

## 2020-11-05 ENCOUNTER — Encounter
Admission: EM | Disposition: A | Discharge status: Home / Self Care | Payer: Self-pay | Source: Home / Self Care | Attending: Family Medicine

## 2020-11-05 DIAGNOSIS — E11621 Type 2 diabetes mellitus with foot ulcer: Secondary | ICD-10-CM

## 2020-11-05 DIAGNOSIS — E785 Hyperlipidemia, unspecified: Secondary | ICD-10-CM

## 2020-11-05 DIAGNOSIS — M869 Osteomyelitis, unspecified: Secondary | ICD-10-CM

## 2020-11-05 DIAGNOSIS — I959 Hypotension, unspecified: Secondary | ICD-10-CM

## 2020-11-05 HISTORY — PX: AMPUTATION: SHX166

## 2020-11-05 LAB — GLUCOSE, CAPILLARY
Glucose-Capillary: 118 mg/dL — ABNORMAL HIGH (ref 70–99)
Glucose-Capillary: 129 mg/dL — ABNORMAL HIGH (ref 70–99)
Glucose-Capillary: 138 mg/dL — ABNORMAL HIGH (ref 70–99)
Glucose-Capillary: 195 mg/dL — ABNORMAL HIGH (ref 70–99)
Glucose-Capillary: 308 mg/dL — ABNORMAL HIGH (ref 70–99)
Glucose-Capillary: 79 mg/dL (ref 70–99)
Glucose-Capillary: 95 mg/dL (ref 70–99)

## 2020-11-05 LAB — COMPREHENSIVE METABOLIC PANEL
ALT: 12 U/L (ref 0–44)
AST: 14 U/L — ABNORMAL LOW (ref 15–41)
Albumin: 2.7 g/dL — ABNORMAL LOW (ref 3.5–5.0)
Alkaline Phosphatase: 73 U/L (ref 38–126)
Anion gap: 5 (ref 5–15)
BUN: 24 mg/dL — ABNORMAL HIGH (ref 6–20)
CO2: 23 mmol/L (ref 22–32)
Calcium: 8.1 mg/dL — ABNORMAL LOW (ref 8.9–10.3)
Chloride: 111 mmol/L (ref 98–111)
Creatinine, Ser: 1.49 mg/dL — ABNORMAL HIGH (ref 0.44–1.00)
GFR, Estimated: 41 mL/min — ABNORMAL LOW (ref >60)
Glucose, Bld: 139 mg/dL — ABNORMAL HIGH (ref 70–99)
Potassium: 4.7 mmol/L (ref 3.5–5.1)
Sodium: 139 mmol/L (ref 135–145)
Total Bilirubin: 0.5 mg/dL (ref 0.3–1.2)
Total Protein: 7.4 g/dL (ref 6.5–8.1)

## 2020-11-05 LAB — URINE CULTURE

## 2020-11-05 LAB — CBC WITH DIFFERENTIAL/PLATELET
Abs Immature Granulocytes: 0.03 10*3/uL (ref 0.00–0.07)
Basophils Absolute: 0 10*3/uL (ref 0.0–0.1)
Basophils Relative: 1 %
Eosinophils Absolute: 0.3 10*3/uL (ref 0.0–0.5)
Eosinophils Relative: 4 %
HCT: 32.1 % — ABNORMAL LOW (ref 36.0–46.0)
Hemoglobin: 10.5 g/dL — ABNORMAL LOW (ref 12.0–15.0)
Immature Granulocytes: 0 %
Lymphocytes Relative: 25 %
Lymphs Abs: 1.8 10*3/uL (ref 0.7–4.0)
MCH: 31.7 pg (ref 26.0–34.0)
MCHC: 32.7 g/dL (ref 30.0–36.0)
MCV: 97 fL (ref 80.0–100.0)
Monocytes Absolute: 0.9 10*3/uL (ref 0.1–1.0)
Monocytes Relative: 12 %
Neutro Abs: 4.2 10*3/uL (ref 1.7–7.7)
Neutrophils Relative %: 58 %
Platelets: 283 10*3/uL (ref 150–400)
RBC: 3.31 MIL/uL — ABNORMAL LOW (ref 3.87–5.11)
RDW: 13.2 % (ref 11.5–15.5)
WBC: 7.3 10*3/uL (ref 4.0–10.5)
nRBC: 0 % (ref 0.0–0.2)

## 2020-11-05 SURGERY — AMPUTATION, FOOT, RAY
Anesthesia: General | Laterality: Right

## 2020-11-05 MED ORDER — FENTANYL CITRATE (PF) 100 MCG/2ML IJ SOLN: 25.0000 ug | INTRAMUSCULAR | Status: DC | PRN

## 2020-11-05 MED ORDER — VANCOMYCIN HCL 1250 MG/250ML IV SOLN
1250.0000 mg | INTRAVENOUS | Status: DC
  Administered 2020-11-05: 1250 mg via INTRAVENOUS
  Filled 2020-11-05 (×3): qty 250

## 2020-11-05 MED ORDER — PROPOFOL 500 MG/50ML IV EMUL
INTRAVENOUS | Status: DC | PRN
  Administered 2020-11-05: 175 ug/kg/min via INTRAVENOUS

## 2020-11-05 MED ORDER — DEXAMETHASONE SODIUM PHOSPHATE 10 MG/ML IJ SOLN
INTRAMUSCULAR | Status: AC
  Filled 2020-11-05: qty 1

## 2020-11-05 MED ORDER — BUPIVACAINE HCL 0.5 % IJ SOLN
INTRAMUSCULAR | Status: DC | PRN
  Administered 2020-11-05: 10 mL

## 2020-11-05 MED ORDER — VANCOMYCIN HCL 1000 MG IV SOLR
INTRAVENOUS | Status: DC | PRN
  Administered 2020-11-05: 1 g

## 2020-11-05 MED ORDER — PROPOFOL 1000 MG/100ML IV EMUL
INTRAVENOUS | Status: AC
  Filled 2020-11-05: qty 100

## 2020-11-05 MED ORDER — ONDANSETRON HCL 4 MG/2ML IJ SOLN: 4.0000 mg | Freq: Once | INTRAMUSCULAR | Status: DC | PRN

## 2020-11-05 MED ORDER — ONDANSETRON HCL 4 MG/2ML IJ SOLN
INTRAMUSCULAR | Status: DC | PRN
  Administered 2020-11-05: 4 mg via INTRAVENOUS

## 2020-11-05 MED ORDER — DEXTROSE-NACL 5-0.45 % IV SOLN: Freq: Once | INTRAVENOUS | Status: AC

## 2020-11-05 MED ORDER — LIDOCAINE HCL (PF) 2 % IJ SOLN
INTRAMUSCULAR | Status: AC
  Filled 2020-11-05: qty 5

## 2020-11-05 MED ORDER — MIDAZOLAM HCL 2 MG/2ML IJ SOLN
INTRAMUSCULAR | Status: AC
  Filled 2020-11-05: qty 2

## 2020-11-05 MED ORDER — LIDOCAINE HCL (CARDIAC) PF 100 MG/5ML IV SOSY
PREFILLED_SYRINGE | INTRAVENOUS | Status: DC | PRN
  Administered 2020-11-05: 100 mg via INTRAVENOUS

## 2020-11-05 MED ORDER — DEXAMETHASONE SODIUM PHOSPHATE 10 MG/ML IJ SOLN
INTRAMUSCULAR | Status: DC | PRN
  Administered 2020-11-05: 5 mg via INTRAVENOUS

## 2020-11-05 MED ORDER — BUPIVACAINE LIPOSOME 1.3 % IJ SUSP
INTRAMUSCULAR | Status: DC | PRN
  Administered 2020-11-05: 10 mL

## 2020-11-05 MED ORDER — PHENYLEPHRINE HCL (PRESSORS) 10 MG/ML IV SOLN
INTRAVENOUS | Status: DC | PRN
  Administered 2020-11-05: 50 ug via INTRAVENOUS
  Administered 2020-11-05: 100 ug via INTRAVENOUS
  Administered 2020-11-05: 150 ug via INTRAVENOUS
  Administered 2020-11-05: 100 ug via INTRAVENOUS
  Administered 2020-11-05: 150 ug via INTRAVENOUS
  Administered 2020-11-05: 100 ug via INTRAVENOUS
  Administered 2020-11-05: 150 ug via INTRAVENOUS
  Administered 2020-11-05: 100 ug via INTRAVENOUS

## 2020-11-05 MED ORDER — FENTANYL CITRATE (PF) 100 MCG/2ML IJ SOLN
INTRAMUSCULAR | Status: AC
  Filled 2020-11-05: qty 2

## 2020-11-05 MED ORDER — VANCOMYCIN HCL 1000 MG IV SOLR
INTRAVENOUS | Status: AC
  Filled 2020-11-05: qty 1000

## 2020-11-05 MED ORDER — ONDANSETRON HCL 4 MG/2ML IJ SOLN
INTRAMUSCULAR | Status: AC
  Filled 2020-11-05: qty 2

## 2020-11-05 MED ORDER — PROPOFOL 10 MG/ML IV BOLUS
INTRAVENOUS | Status: AC
  Filled 2020-11-05: qty 20

## 2020-11-05 MED ORDER — FENTANYL CITRATE (PF) 100 MCG/2ML IJ SOLN
INTRAMUSCULAR | Status: DC | PRN
  Administered 2020-11-05: 50 ug via INTRAVENOUS

## 2020-11-05 MED ORDER — BUPIVACAINE LIPOSOME 1.3 % IJ SUSP
INTRAMUSCULAR | Status: AC
  Filled 2020-11-05: qty 20

## 2020-11-05 MED ORDER — PROPOFOL 10 MG/ML IV BOLUS
INTRAVENOUS | Status: DC | PRN
  Administered 2020-11-05: 30 mg via INTRAVENOUS
  Administered 2020-11-05: 140 mg via INTRAVENOUS

## 2020-11-05 MED ORDER — MIDAZOLAM HCL 2 MG/2ML IJ SOLN
INTRAMUSCULAR | Status: DC | PRN
  Administered 2020-11-05: 2 mg via INTRAVENOUS

## 2020-11-05 SURGICAL SUPPLY — 37 items
BAG COUNTER SPONGE EZ (MISCELLANEOUS) IMPLANT
BLADE OSC/SAGITTAL 5.5X25 (BLADE) ×2 IMPLANT
BNDG COHESIVE 4X5 TAN STRL (GAUZE/BANDAGES/DRESSINGS) IMPLANT
BNDG ELASTIC 4X5.8 VLCR NS LF (GAUZE/BANDAGES/DRESSINGS) IMPLANT
BNDG ESMARK 4X12 TAN STRL LF (GAUZE/BANDAGES/DRESSINGS) ×2 IMPLANT
BNDG GAUZE 4.5X4.1 6PLY STRL (MISCELLANEOUS) ×2 IMPLANT
BNDG STRETCH 4X75 STRL LF (GAUZE/BANDAGES/DRESSINGS) ×2 IMPLANT
COVER WAND RF STERILE (DRAPES) ×2 IMPLANT
CUFF TOURN SGL QUICK 12 (TOURNIQUET CUFF) ×2 IMPLANT
CUFF TOURN SGL QUICK 18X4 (TOURNIQUET CUFF) IMPLANT
DRAIN PENROSE 1/4X12 LTX STRL (WOUND CARE) IMPLANT
DURAPREP 26ML APPLICATOR (WOUND CARE) ×2 IMPLANT
ELECT REM PT RETURN 9FT ADLT (ELECTROSURGICAL) ×2
ELECTRODE REM PT RTRN 9FT ADLT (ELECTROSURGICAL) ×1 IMPLANT
GAUZE SPONGE 4X4 12PLY STRL (GAUZE/BANDAGES/DRESSINGS) ×2 IMPLANT
GLOVE SURG ENC MOIS LTX SZ7.5 (GLOVE) ×2 IMPLANT
GLOVE SURG UNDER LTX SZ8 (GLOVE) ×2 IMPLANT
GOWN STRL REUS W/ TWL XL LVL3 (GOWN DISPOSABLE) ×2 IMPLANT
GOWN STRL REUS W/TWL XL LVL3 (GOWN DISPOSABLE) ×2
HANDLE YANKAUER SUCT BULB TIP (MISCELLANEOUS) ×2 IMPLANT
KIT STIMULAN RAPID CURE 5CC (Orthopedic Implant) ×2 IMPLANT
KIT TURNOVER KIT A (KITS) ×2 IMPLANT
LABEL OR SOLS (LABEL) IMPLANT
MANIFOLD NEPTUNE II (INSTRUMENTS) ×2 IMPLANT
NDL SAFETY ECLIPSE 18X1.5 (NEEDLE) ×1 IMPLANT
NEEDLE FILTER BLUNT 18X 1/2SAF (NEEDLE) ×1
NEEDLE FILTER BLUNT 18X1 1/2 (NEEDLE) ×1 IMPLANT
NEEDLE HYPO 18GX1.5 SHARP (NEEDLE) ×1
NS IRRIG 500ML POUR BTL (IV SOLUTION) ×2 IMPLANT
PACK EXTREMITY ARMC (MISCELLANEOUS) ×2 IMPLANT
PAD ABD DERMACEA PRESS 5X9 (GAUZE/BANDAGES/DRESSINGS) ×4 IMPLANT
SOL PREP PVP 2OZ (MISCELLANEOUS) ×2
SOLUTION PREP PVP 2OZ (MISCELLANEOUS) ×1 IMPLANT
SPONGE LAP 18X18 RF (DISPOSABLE) ×2 IMPLANT
STOCKINETTE M/LG 89821 (MISCELLANEOUS) ×2 IMPLANT
SUT ETHILON 2 0 FS 18 (SUTURE) ×6 IMPLANT
SYR 10ML LL (SYRINGE) ×4 IMPLANT

## 2020-11-05 NOTE — Op Note (Signed)
Operative note   Surgeon:Richerd Grime Lawyer: None    Preop diagnosis: Osteomyelitis right fifth metatarsal phalangeal joint    Postop diagnosis: Same    Procedure: 1.  Right fifth ray amputation 2.  Implantation vancomycin impregnated beads    EBL: Minimal    Anesthesia:local and general.  Local consisted of 10 cc of Exparel long-acting anesthetic and 10 cc of 0.5% bupivacaine    Hemostasis: Ankle tourniquet inflated to 200 mmHg for approximately 20 minutes    Specimen: Osteomyelitis right fifth metatarsal for pathology and culture    Complications: None    Operative indications:Donna Fisher is an 55 y.o. that presents today for surgical intervention.  The risks/benefits/alternatives/complications have been discussed and consent has been given.    Procedure:  Patient was brought into the OR and placed on the operating table in thesupine position. After anesthesia was obtained theright lower extremity was prepped and draped in usual sterile fashion.  Attention was directed to the lateral aspect of the right foot where a large necrotic ulceration with full-thickness ulceration down to bone was noted surrounding the fifth MTPJ.  Incision was made circumferential around the ulcerative site and including the fifth toe.  This was taken back proximal to the proximal one third of the fifth metatarsal.  An osteotomy was created.  The toe and residual fifth ray was then removed from the surgical in toto.  A portion of the obviously infected bone was sent for culture and the remainder was sent for pathology.  The proximal margin was inked.  The wound was then flushed with copious amounts of irrigation.  A small sinus tract was noted into the medial arch.  This was opened.  The entire area was then flushed appropriately.  Copious quantities of fluid were used.  Next the medullary shaft of the fifth metatarsal was initially packed with vancomycin impregnated calcium sulfate in the medial  arch packed with vancomycin beads.  After final flush good skin perfusion was noted and the tourniquet was dropped.  All bleeders were Bovie cauterized as appropriate.  Closure was then performed with a 2-0 nylon.  A large bulky sterile dressing was applied.    Patient tolerated the procedure and anesthesia well.  Was transported from the OR to the PACU with all vital signs stable and vascular status intact. To be discharged per routine protocol.  Will follow up in approximately 1 week in the outpatient clinic.

## 2020-11-05 NOTE — Consult Note (Signed)
Pharmacy Antibiotic Note  Donna Fisher is a 55 y.o. female admitted on 11/03/2020 with Right foot infection. Patient with PMH significant for DM and prior toe amputations to RLE.  Pharmacy has been consulted for Vancomycin and aztreonam dosing. 4/18 Xray: + for osteomyelitis  Patient developed hives and mild throat swelling with oral penicillin requiring use of Epi-pen and ED visit. Patient received ceftriaxone in 2018 and developed a rash with associated skin peeling. Patient will undergo debridement & fifth ray amputation of right foot 11/05/2020.  WBC 7.3, trending down Scr 1.49 trending down Afebrile  Plan:  Increase vancomycin dose to 1250 mg Q24H. Goal AUC: 400-600  Estimated AUC: 538.7  SCr used: 1.49  Vd used: 0.72  Continue aztreonam 2 gram Q8H & metronidazole 500 mg IV Q8h  Follow up current blood cultures  Follow up surgical wound cultures  Monitor renal function daily  Height: '5\' 4"'$  (162.6 cm) Weight: 91.2 kg (201 lb) IBW/kg (Calculated) : 54.7  Temp (24hrs), Avg:99.1 F (37.3 C), Min:98.4 F (36.9 C), Max:99.6 F (37.6 C)  Recent Labs  Lab 11/03/20 1406 11/03/20 1424 11/04/20 0421 11/05/20 0449  WBC 15.6*  --  9.4 7.3  CREATININE 1.83*  --  1.68* 1.49*  LATICACIDVEN  --  1.6  --   --     Estimated Creatinine Clearance: 47.2 mL/min (A) (by C-G formula based on SCr of 1.49 mg/dL (H)).    Allergies  Allergen Reactions  . Atorvastatin   . Penicillins Swelling    .Has patient had a PCN reaction causing immediate rash, facial/tongue/throat swelling, SOB or lightheadedness with hypotension: No Has patient had a PCN reaction causing severe rash involving mucus membranes or skin necrosis: No Has patient had a PCN reaction that required hospitalization: No Has patient had a PCN reaction occurring within the last 10 years: No If all of the above answers are "NO", then may proceed with Cephalosporin use.  .Has patient had a PCN reaction causing immediate  rash, facial/tongue/throat swelling, SOB or lightheadedness with hypotension: No Has patient had a PCN reaction causing severe rash involving mucus membranes or skin necrosis: No Has patient had a PCN reaction that required hospitalization: No Has patient had a PCN reaction occurring within the last 10 years: No If all of the above answers are "NO", then may proceed with Cephalosporin use. .Has patient had a PCN reaction causing immediate rash, facial/tongue/throat swelling, SOB or lightheadedness with hypotension: No Has patient had a PCN reaction causing severe rash involving mucus membranes or skin necrosis: No Has patient had a PCN reaction that required hospitalization: No Has patient had a PCN reaction occurring within the last 10 years: No If all of the above answers are "NO", then may proceed with Cephalosporin use.  . Codeine Itching  . Ceftriaxone Rash    Antimicrobials this admission: 4/18 Vancomycin >>  4/18 Aztreonam >>  4/19 Metronidazole >>  Microbiology results: 4/18 BCx: no growth 2 days 4/48 UCx: multiple species present  Thank you for allowing pharmacy to be a part of this patient's care.  Beckey Rutter  PharmD Candidate, Class of 2022 [place]

## 2020-11-05 NOTE — Transfer of Care (Signed)
Immediate Anesthesia Transfer of Care Note  Patient: Donna Fisher  Procedure(s) Performed: AMPUTATION RAY - Right Fifth (Right )  Patient Location: PACU  Anesthesia Type:General  Level of Consciousness: drowsy  Airway & Oxygen Therapy: Patient Spontanous Breathing and Patient connected to face mask oxygen  Post-op Assessment: Report given to RN and Post -op Vital signs reviewed and stable  Post vital signs: Reviewed and stable  Last Vitals:  Vitals Value Taken Time  BP 104/52 11/05/20 1750  Temp    Pulse 77 11/05/20 1754  Resp 19 11/05/20 1754  SpO2 99 % 11/05/20 1754  Vitals shown include unvalidated device data.  Last Pain:  Vitals:   11/05/20 1443  TempSrc: Oral  PainSc: 3       Patients Stated Pain Goal: 3 ([id number])  Complications: No complications documented.   Oral airway in place, communicated with PACU RN

## 2020-11-05 NOTE — Anesthesia Postprocedure Evaluation (Signed)
Anesthesia Post Note  Patient: Donna Fisher  Procedure(s) Performed: AMPUTATION RAY - Right Fifth (Right )  Patient location during evaluation: PACU Anesthesia Type: General Level of consciousness: awake and alert Pain management: pain level controlled Vital Signs Assessment: post-procedure vital signs reviewed and stable Respiratory status: spontaneous breathing, nonlabored ventilation, respiratory function stable and patient connected to nasal cannula oxygen Cardiovascular status: blood pressure returned to baseline and stable Postop Assessment: no apparent nausea or vomiting Anesthetic complications: no   No complications documented.   Last Vitals:  Vitals:   11/05/20 1900 11/05/20 1929  BP: (!) 107/59 113/64  Pulse: 74 76  Resp: 11 18  Temp: (!) 36.1 C 36.9 C  SpO2: 93% 99%    Last Pain:  Vitals:   11/05/20 1900  TempSrc:   PainSc: 0-No pain                 Martha Clan

## 2020-11-05 NOTE — Progress Notes (Signed)
   11/05/20 1000  Clinical Encounter Type  Visited With Patient  Visit Type Initial  Referral From Chaplain  Consult/Referral To [place] attempted visit with Pt in room 138A, Pt [place]. Pt was asleep will try again another time. Chaplain Autumn Patty said a prayer before departing the room.

## 2020-11-05 NOTE — Anesthesia Preprocedure Evaluation (Signed)
Anesthesia Evaluation  Patient identified by MRN, date of birth, ID band Patient awake    Reviewed: Allergy & Precautions, NPO status , Patient's Chart, lab work & pertinent test results  History of Anesthesia Complications (+) PONV and history of anesthetic complications  Airway Mallampati: III       Dental  (+) Chipped, Missing, Poor Dentition, Loose,    Pulmonary neg sleep apnea, neg COPD, Current Smoker,           Cardiovascular (-) hypertension(-) Past MI and (-) CHF (-) dysrhythmias (-) Valvular Problems/Murmurs  Chronic hypotension   Neuro/Psych neg Seizures Anxiety TIA (R sided weakness, resolved in less than 1 day)   GI/Hepatic Neg liver ROS, neg GERD  ,  Endo/Other  diabetes, Type 2, Oral Hypoglycemic Agents, Insulin Dependent  Renal/GU Renal InsufficiencyRenal disease     Musculoskeletal   Abdominal   Peds  Hematology   Anesthesia Other Findings   Reproductive/Obstetrics                             Anesthesia Physical Anesthesia Plan  ASA: III  Anesthesia Plan: General   Post-op Pain Management:    Induction: Intravenous  PONV Risk Score and Plan: 3 and Propofol infusion and TIVA  Airway Management Planned:   Additional Equipment:   Intra-op Plan:   Post-operative Plan:   Informed Consent: I have reviewed the patients History and Physical, chart, labs and discussed the procedure including the risks, benefits and alternatives for the proposed anesthesia with the patient or authorized representative who has indicated his/her understanding and acceptance.       Plan Discussed with:   Anesthesia Plan Comments:         Anesthesia Quick Evaluation

## 2020-11-05 NOTE — Progress Notes (Signed)
[facility] NAME: Nechy Dominquez    MR#:  [id number]  DATE OF BIRTH:  [birth year]  SUBJECTIVE:   Patient awaiting her right foot surgery. REVIEW OF SYSTEMS:   Review of Systems  Constitutional: Negative for chills, fever and weight loss.  HENT: Negative for ear discharge, ear pain and nosebleeds.   Eyes: Negative for blurred vision, pain and discharge.  Respiratory: Negative for sputum production, shortness of breath, wheezing and stridor.   Cardiovascular: Negative for chest pain, palpitations, orthopnea and PND.  Gastrointestinal: Negative for abdominal pain, diarrhea, nausea and vomiting.  Genitourinary: Negative for frequency and urgency.  Musculoskeletal: Negative for back pain and joint pain.  Neurological: Positive for weakness. Negative for sensory change, speech change and focal weakness.  Psychiatric/Behavioral: Negative for depression and hallucinations. The patient is not nervous/anxious.    Tolerating Diet:npo Tolerating PT:   DRUG ALLERGIES:   Allergies  Allergen Reactions  . Atorvastatin   . Penicillins Swelling    .Has patient had a PCN reaction causing immediate rash, facial/tongue/throat swelling, SOB or lightheadedness with hypotension: No Has patient had a PCN reaction causing severe rash involving mucus membranes or skin necrosis: No Has patient had a PCN reaction that required hospitalization: No Has patient had a PCN reaction occurring within the last 10 years: No If all of the above answers are "NO", then may proceed with Cephalosporin use.  .Has patient had a PCN reaction causing immediate rash, facial/tongue/throat swelling, SOB or lightheadedness with hypotension: No Has patient had a PCN reaction causing severe rash involving mucus membranes or skin necrosis: No Has patient had a PCN reaction that required hospitalization: No Has patient had a PCN reaction occurring within the last 10 years: No If  all of the above answers are "NO", then may proceed with Cephalosporin use. .Has patient had a PCN reaction causing immediate rash, facial/tongue/throat swelling, SOB or lightheadedness with hypotension: No Has patient had a PCN reaction causing severe rash involving mucus membranes or skin necrosis: No Has patient had a PCN reaction that required hospitalization: No Has patient had a PCN reaction occurring within the last 10 years: No If all of the above answers are "NO", then may proceed with Cephalosporin use.  . Codeine Itching  . Ceftriaxone Rash    VITALS:  Blood pressure (!) 103/59, pulse 81, temperature 98.3 F (36.8 C), temperature source Oral, resp. rate 17, height '5\' 4"'$  (1.626 m), weight 91.2 kg, SpO2 93 %.  PHYSICAL EXAMINATION:   Physical Exam  GENERAL:  55 y.o.-year-old patient lying in the bed with no acute distress.  LUNGS: Normal breath sounds bilaterally, no wheezing, rales, rhonchi. No use of accessory muscles of respiration.  CARDIOVASCULAR: S1, S2 normal. No murmurs, rubs, or gallops.  ABDOMEN: Soft, nontender, nondistended. Bowel sounds present. No organomegaly or mass.  EXTREMITIES:       NEUROLOGIC: grossly non focal   PSYCHIATRIC:  patient is alert and oriented x 3.  SKIN: above right foot  LABORATORY PANEL:  CBC Recent Labs  Lab 11/05/20 0449  WBC 7.3  HGB 10.5*  HCT 32.1*  PLT 283    Chemistries  Recent Labs  Lab 11/05/20 0449  NA 139  K 4.7  CL 111  CO2 23  GLUCOSE 139*  BUN 24*  CREATININE 1.49*  CALCIUM 8.1*  AST 14*  ALT 12  ALKPHOS 73  BILITOT 0.5   Cardiac Enzymes No results for input(s): TROPONINI in the last  168 hours. RADIOLOGY:  MR FOOT RIGHT WO CONTRAST  Result Date: 11/04/2020 CLINICAL DATA:  Diabetic foot infection.  Osteomyelitis EXAM: MRI OF THE RIGHT FOREFOOT WITHOUT CONTRAST TECHNIQUE: Multiplanar, multisequence MR imaging of the right forefoot was performed. No intravenous contrast was administered. COMPARISON:   X-ray 11/03/2020 FINDINGS: Bones/Joint/Cartilage Acute osteomyelitis involving the fifth metatarsal head and neck with bony destruction and pathologic fracture. Extensive high T2/STIR signal throughout the fifth metatarsal diaphysis extending to the level of the proximal metaphysis with associated confluent low T1 signal change. Small fifth MTP joint effusion compatible with septic arthritis. There is also destruction and marrow replacement of the fifth toe proximal phalanx base and diaphysis. Fracture through the proximal metaphysis of the proximal phalanx, better seen radiographically. Preserved T1 marrow signal within the fifth toe proximal phalanx adjacent to the PIP joint. The middle and distal phalanx of the fifth toe are within normal limits. There is mild subcortical marrow edema within the fourth metatarsal head with preserved T1 marrow signal. Prior amputation of the second toe and great toe proximal phalanx. The remaining osseous structures are intact without fracture or malalignment. Ligaments Intact Lisfranc ligament. Muscles and Tendons Mild diffuse edema-like signal of the intrinsic foot musculature which may reflect denervation changes and/or myositis. Fifth toe flexor tendon closely approximates the ulcer base at the level of the fifth MTP joint. Marked tendinosis of the flexor hallucis longus tendon within the forefoot. No tenosynovial fluid collection. Soft tissues Soft tissue ulceration at the lateral aspect of the forefoot adjacent to the level of the fifth MTP joint. Surrounding soft tissue swelling and edema. No organized fluid collection. IMPRESSION: 1. Soft tissue ulceration at the lateral aspect of the forefoot adjacent to the level of the fifth MTP joint. Acute osteomyelitis of the fifth metatarsal and fifth toe proximal phalanx, as described above. 2. Small fifth MTP joint effusion compatible with septic arthritis. 3. Mild subcortical marrow edema within the fourth metatarsal head. This  may represent reactive osteitis versus early osteomyelitis. 4. Marked tendinosis of the flexor hallucis longus tendon within the forefoot. 5. Mild diffuse edema-like signal of the intrinsic foot musculature which may reflect denervation changes and/or myositis. Findings are in agreement with the preliminary report provided by Dr. Marguerita Merles. The findings were communicated to Dr Myna Hidalgo by Dr. Marguerita Merles by telephone at the time of preliminary report. Electronically Signed   By: Davina Poke D.O.   On: 11/04/2020 08:21   DG Foot Complete Right  Result Date: 11/03/2020 CLINICAL DATA:  Foot infection laterally for several months. EXAM: RIGHT FOOT COMPLETE - 3+ VIEW COMPARISON:  12/25/2015 FINDINGS: There is been interval amputation of the first toe in the midportion of the proximal phalanx as well as the entire second toe. Fragmentation in the distal aspect of the fifth metatarsal is noted with associated overlying soft tissue wound. Similar findings are noted at the base of the fifth proximal phalanx with erosive change and fragmentation. These changes are consistent with underlying osteomyelitis. IMPRESSION: Osteomyelitis in the distal aspect of the fifth metatarsal as well as the fifth proximal phalanx Electronically Signed   By: Inez Catalina M.D.   On: 11/03/2020 16:53   [place] EKG SITE RITE  Result Date: 11/05/2020 If Site Rite image not attached, placement could not be confirmed due to current cardiac rhythm.  ASSESSMENT AND PLAN:  CESILY COSTENBADER is a 55 y.o. female with a past medical history of diabetes, hyperlipidemia, presents to the emergency department for a right foot infection.  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  She has been following up with podiatry for this but it has gotten worse and now over the weekend patient having fever as high as 102.  Patient was seen at  podiatry clinic and sent to the emergency department for admission and antibiotics  Osteomyelitis R fifth MTPJ --Definitive management Dr. Vickki Muff podiatrist- Surgery 4/20 -- ID Dr. Ola Spurr input appreciated. recommends PICC line and IV abxs --Resume aspirin as per surgeon  DM Type 2-neuropathy, nephropathy  A1c 8.7 --Sensitive sliding scale --Hold glipizide XL 10 mg, hold Jardiance --Continue low-dose Levemir 20 twice daily since surgery will be on 4/20 (will resume Levemir 80 twice daily) --Continue gabapentin 600 twice daily  Chronic hypotension --Probably related to neuropathy continue midodrine 10 mg tid  Recent cataract surgery Resume all drops  HLD --Holding Repatha, statin at this time  urinary incontinence--chronic --cont Detrol LA   Procedures: Family communication :none today Consults :ID, podiatry CODE STATUS: Full DVT Prophylaxis :lovenox Level of care: Med-Surg Status is: Inpatient  Remains inpatient appropriate because:Inpatient level of care appropriate due to severity of illness   Dispo: The patient is from: Home              Anticipated d/c is to: Home              Patient currently is not medically stable to d/c.   Difficult to place patient No        TOTAL TIME TAKING CARE OF THIS PATIENT: 25 minutes.  >50% time spent on counselling and coordination of care  Note: This dictation was prepared with Dragon dictation along with smaller phrase technology. Any transcriptional errors that result from this process are unintentional.  Fritzi Mandes M.D    [facility]   CC: Primary care physician; Frazier Richards, MDPatient ID: [name], female   DOB: 10-25-1965, 55 y.o.   MRN: [id number]

## 2020-11-05 NOTE — Anesthesia Procedure Notes (Signed)
Procedure Name: LMA Insertion Date/Time: 11/05/2020 4:47 PM Performed by: Lia Foyer, CRNA Pre-anesthesia Checklist: Patient identified, Emergency Drugs available, Suction available and Patient being monitored Patient Re-evaluated:Patient Re-evaluated prior to induction Oxygen Delivery Method: Circle system utilized Preoxygenation: Pre-oxygenation with 100% oxygen Induction Type: IV induction Ventilation: Mask ventilation without difficulty LMA: LMA flexible inserted LMA Size: 3.5 Tube type: Oral Number of attempts: 1 Placement Confirmation: positive ETCO2 and breath sounds checked- equal and bilateral Tube secured with: Tape Dental Injury: Teeth and Oropharynx as per pre-operative assessment

## 2020-11-05 NOTE — Progress Notes (Signed)
[place] INFECTIOUS DISEASE PROGRESS NOTE Date of Admission:  11/03/2020     ID: Donna Fisher is a 55 y.o. female with  DFI with osteo Active Problems:   Sepsis ([place])   Diabetic ulcer of right midfoot associated with type 2 diabetes mellitus ([place])   Subjective: S/p surgery 4/20 With findings of ulceration down to bone around the fifth MTP joint and a sinus tract to the medial arch.  She underwent fifth ray amputation and implantation of vancomycin impregnated beads.  Cultures and path were sent from the bone.  ROS  Eleven systems are reviewed and negative except per hpi  Medications:  Antibiotics Given (last 72 hours)    Date/Time Action Medication Dose Rate   11/03/20 1500 New Bag/Given   aztreonam (AZACTAM) 2 g in sodium chloride 0.9 % 100 mL IVPB 2 g 200 mL/hr   11/03/20 1549 New Bag/Given   vancomycin (VANCOCIN) IVPB 1000 mg/200 mL premix 1,000 mg 200 mL/hr   11/03/20 2048 New Bag/Given   vancomycin (VANCOCIN) IVPB 1000 mg/200 mL premix 1,000 mg 200 mL/hr   11/03/20 2318 New Bag/Given   [MAR Hold] aztreonam (AZACTAM) 2 g in sodium chloride 0.9 % 100 mL IVPB (MAR Hold since Wed 11/05/2020 at 1437.Hold Reason: Transfer to a Procedural area.) 2 g 200 mL/hr   11/04/20 0604 New Bag/Given   [MAR Hold] aztreonam (AZACTAM) 2 g in sodium chloride 0.9 % 100 mL IVPB (MAR Hold since Wed 11/05/2020 at 1437.Hold Reason: Transfer to a Procedural area.) 2 g 200 mL/hr   11/04/20 1246 New Bag/Given   [MAR Hold] aztreonam (AZACTAM) 2 g in sodium chloride 0.9 % 100 mL IVPB (MAR Hold since Wed 11/05/2020 at 1437.Hold Reason: Transfer to a Procedural area.) 2 g 200 mL/hr   11/04/20 1633 New Bag/Given   vancomycin (VANCOCIN) 750 mg in sodium chloride 0.9 % 250 mL IVPB 750 mg 250 mL/hr   11/04/20 1822 New Bag/Given   [MAR Hold] metroNIDAZOLE (FLAGYL) IVPB 500 mg (MAR Hold since Wed 11/05/2020 at 1437.Hold Reason: Transfer to a Procedural area.) 500 mg 100 mL/hr   11/04/20 2130 New Bag/Given    [MAR Hold] aztreonam (AZACTAM) 2 g in sodium chloride 0.9 % 100 mL IVPB (MAR Hold since Wed 11/05/2020 at 1437.Hold Reason: Transfer to a Procedural area.) 2 g 200 mL/hr   11/04/20 2300 New Bag/Given   [MAR Hold] metroNIDAZOLE (FLAGYL) IVPB 500 mg (MAR Hold since Wed 11/05/2020 at 1437.Hold Reason: Transfer to a Procedural area.) 500 mg 100 mL/hr   11/05/20 0508 New Bag/Given   [MAR Hold] aztreonam (AZACTAM) 2 g in sodium chloride 0.9 % 100 mL IVPB (MAR Hold since Wed 11/05/2020 at 1437.Hold Reason: Transfer to a Procedural area.) 2 g 200 mL/hr   11/05/20 0546 New Bag/Given   [MAR Hold] metroNIDAZOLE (FLAGYL) IVPB 500 mg (MAR Hold since Wed 11/05/2020 at 1437.Hold Reason: Transfer to a Procedural area.) 500 mg 100 mL/hr     . [MAR Hold] Difluprednate  1 drop Left Eye BID  . [MAR Hold] enoxaparin (LOVENOX) injection  0.5 mg/kg Subcutaneous Q24H  . [MAR Hold] ezetimibe  10 mg Oral Daily  . [MAR Hold] fesoterodine  4 mg Oral Daily  . [MAR Hold] gabapentin  600 mg Oral BID  . [MAR Hold] insulin aspart  0-6 Units Subcutaneous Q4H  . [MAR Hold] insulin detemir  20 Units Subcutaneous BID  . [MAR Hold] LORazepam  0.5 mg Oral BID  . [MAR Hold] moxifloxacin  1 drop Left Eye  QID  . [MAR Hold] nepafenac  1 drop Left Eye Daily  . [MAR Hold] nicotine  21 mg Transdermal Daily  . povidone-iodine  2 application Topical Once  . [MAR Hold] sodium chloride flush  3 mL Intravenous Q12H  . [MAR Hold] valACYclovir  1,000 mg Oral Daily    Objective: Vital signs in last 24 hours: Temp:  [97.9 F (36.6 C)-99.6 F (37.6 C)] 97.9 F (36.6 C) (04/20 1443) Pulse Rate:  [81-94] 81 (04/20 1443) Resp:  [16-18] 16 (04/20 1443) BP: (100-144)/(59-85) 129/67 (04/20 1443) SpO2:  [91 %-96 %] 96 % (04/20 1443) Weight:  [91.2 kg] 91.2 kg (04/20 1443) Physical Exam  Constitutional:  oriented to person, place, and time. appears well-developed and well-nourished. No distress.  HENT: anictericc Mouth/Throat: Oropharynx is  clear and moist. No oropharyngeal exudate.  Cardiovascular: Normal rate, regular rhythm and normal heart sounds.  PICC RUE.  Neurological: He is alert and oriented to person, place, and time.  Skin: R foot wrapped post op Psychiatric: He has a normal mood and affect. His behavior is normal.     Lab Results Recent Labs    11/04/20 0421 11/05/20 0449  WBC 9.4 7.3  HGB 11.0* 10.5*  HCT 33.4* 32.1*  NA 139 139  K 4.3 4.7  CL 109 111  CO2 24 23  BUN 27* 24*  CREATININE 1.68* 1.49*    Microbiology: Results for orders placed or performed during the hospital encounter of 11/03/20  Blood Culture (routine x 2)     Status: None (Preliminary result)   Collection Time: 11/03/20  2:06 PM   Specimen: BLOOD  Result Value Ref Range Status   Specimen Description BLOOD BLOOD LEFT FOREARM  Final   Special Requests   Final    BOTTLES DRAWN AEROBIC AND ANAEROBIC Blood Culture adequate volume   Culture   Final    NO GROWTH 3 DAYS Performed at [facility], [address] [place], [place] [address]    Report Status PENDING  Incomplete  Blood Culture (routine x 2)     Status: None (Preliminary result)   Collection Time: 11/03/20  2:11 PM   Specimen: BLOOD  Result Value Ref Range Status   Specimen Description BLOOD BLOOD LEFT WRIST  Final   Special Requests   Final    BOTTLES DRAWN AEROBIC AND ANAEROBIC Blood Culture adequate volume   Culture   Final    NO GROWTH 3 DAYS Performed at [facility], [address], [place], [place] [address]    Report Status PENDING  Incomplete  Resp Panel by RT-PCR (Flu A&B, Covid) Nasopharyngeal Swab     Status: None   Collection Time: 11/03/20  2:24 PM   Specimen: Nasopharyngeal Swab; Nasopharyngeal(NP) swabs in vial transport medium  Result Value Ref Range Status   SARS Coronavirus 2 by RT PCR NEGATIVE NEGATIVE Final    Comment: (NOTE) SARS-CoV-2 target nucleic acids are NOT DETECTED.  The SARS-CoV-2 RNA is generally detectable  in upper respiratory specimens during the acute phase of infection. The lowest concentration of SARS-CoV-2 viral copies this assay can detect is 138 copies/mL. A negative result does not preclude SARS-Cov-2 infection and should not be used as the sole basis for treatment or other patient management decisions. A negative result may occur with  improper specimen collection/handling, submission of specimen other than nasopharyngeal swab, presence of viral mutation(s) within the areas targeted by this assay, and inadequate number of viral copies(<138 copies/mL). A negative result must  be combined with clinical observations, patient history, and epidemiological information. The expected result is Negative.  Fact Sheet for Patients:  EntrepreneurPulse.com.au  Fact Sheet for Healthcare Providers:  IncredibleEmployment.be  This test is no t yet approved or cleared by the [place] FDA and  has been authorized for detection and/or diagnosis of SARS-CoV-2 by FDA under an Emergency Use Authorization (EUA). This EUA will remain  in effect (meaning this test can be used) for the duration of the COVID-19 declaration under Section 564(b)(1) of the Act, 21 U.S.C.section 360bbb-3(b)(1), unless the authorization is terminated  or revoked sooner.       Influenza A by PCR NEGATIVE NEGATIVE Final   Influenza B by PCR NEGATIVE NEGATIVE Final    Comment: (NOTE) The Xpert Xpress SARS-CoV-2/FLU/RSV plus assay is intended as an aid in the diagnosis of influenza from Nasopharyngeal swab specimens and should not be used as a sole basis for treatment. Nasal washings and aspirates are unacceptable for Xpert Xpress SARS-CoV-2/FLU/RSV testing.  Fact Sheet for Patients: EntrepreneurPulse.com.au  Fact Sheet for Healthcare Providers: IncredibleEmployment.be  This test is not yet approved or cleared by the [place] FDA and has  been authorized for detection and/or diagnosis of SARS-CoV-2 by FDA under an Emergency Use Authorization (EUA). This EUA will remain in effect (meaning this test can be used) for the duration of the COVID-19 declaration under Section 564(b)(1) of the Act, 21 U.S.C. section 360bbb-3(b)(1), unless the authorization is terminated or revoked.  Performed at [facility], [address], [place], [place] [address]   Urine culture     Status: Abnormal   Collection Time: 11/03/20  2:24 PM   Specimen: Urine, Random  Result Value Ref Range Status   Specimen Description   Final    URINE, RANDOM Performed at [facility], [address] [place], [place] [address]    Special Requests   Final    NONE Performed at [facility], [address] [place],  [address]    Culture MULTIPLE SPECIES PRESENT, SUGGEST RECOLLECTION (A)  Final   Report Status 11/05/2020 FINAL  Final     Studies/Results: MR FOOT RIGHT WO CONTRAST  Result Date: 11/04/2020 CLINICAL DATA:  Diabetic foot infection.  Osteomyelitis EXAM: MRI OF THE RIGHT FOREFOOT WITHOUT CONTRAST TECHNIQUE: Multiplanar, multisequence MR imaging of the right forefoot was performed. No intravenous contrast was administered. COMPARISON:  X-ray 11/03/2020 FINDINGS: Bones/Joint/Cartilage Acute osteomyelitis involving the fifth metatarsal head and neck with bony destruction and pathologic fracture. Extensive high T2/STIR signal throughout the fifth metatarsal diaphysis extending to the level of the proximal metaphysis with associated confluent low T1 signal change. Small fifth MTP joint effusion compatible with septic arthritis. There is also destruction and marrow replacement of the fifth toe proximal phalanx base and diaphysis. Fracture through the proximal metaphysis of the proximal phalanx, better seen radiographically. Preserved T1 marrow signal within the fifth toe proximal phalanx adjacent to the PIP joint. The  middle and distal phalanx of the fifth toe are within normal limits. There is mild subcortical marrow edema within the fourth metatarsal head with preserved T1 marrow signal. Prior amputation of the second toe and great toe proximal phalanx. The remaining osseous structures are intact without fracture or malalignment. Ligaments Intact Lisfranc ligament. Muscles and Tendons Mild diffuse edema-like signal of the intrinsic foot musculature which may reflect denervation changes and/or myositis. Fifth toe flexor tendon closely approximates the ulcer base at the level of the fifth MTP joint. Marked tendinosis of  the flexor hallucis longus tendon within the forefoot. No tenosynovial fluid collection. Soft tissues Soft tissue ulceration at the lateral aspect of the forefoot adjacent to the level of the fifth MTP joint. Surrounding soft tissue swelling and edema. No organized fluid collection. IMPRESSION: 1. Soft tissue ulceration at the lateral aspect of the forefoot adjacent to the level of the fifth MTP joint. Acute osteomyelitis of the fifth metatarsal and fifth toe proximal phalanx, as described above. 2. Small fifth MTP joint effusion compatible with septic arthritis. 3. Mild subcortical marrow edema within the fourth metatarsal head. This may represent reactive osteitis versus early osteomyelitis. 4. Marked tendinosis of the flexor hallucis longus tendon within the forefoot. 5. Mild diffuse edema-like signal of the intrinsic foot musculature which may reflect denervation changes and/or myositis. Findings are in agreement with the preliminary report provided by Dr. Marguerita Merles. The findings were communicated to Dr Myna Hidalgo by Dr. Marguerita Merles by telephone at the time of preliminary report. Electronically Signed   By: Davina Poke D.O.   On: 11/04/2020 08:21   DG Foot Complete Right  Result Date: 11/03/2020 CLINICAL DATA:  Foot infection laterally for several months. EXAM: RIGHT FOOT COMPLETE - 3+ VIEW COMPARISON:  12/25/2015  FINDINGS: There is been interval amputation of the first toe in the midportion of the proximal phalanx as well as the entire second toe. Fragmentation in the distal aspect of the fifth metatarsal is noted with associated overlying soft tissue wound. Similar findings are noted at the base of the fifth proximal phalanx with erosive change and fragmentation. These changes are consistent with underlying osteomyelitis. IMPRESSION: Osteomyelitis in the distal aspect of the fifth metatarsal as well as the fifth proximal phalanx Electronically Signed   By: Inez Catalina M.D.   On: 11/03/2020 16:53   [place] EKG SITE RITE  Result Date: 11/05/2020 If Site Rite image not attached, placement could not be confirmed due to current cardiac rhythm.   Assessment/Plan: Donna Fisher is a 55 y.o. female with poorly controlled DM, with PN with nonhealing R foot wound now progressed to infection with underlying osteo.  S/p surgery 4/2- with 5th MT resection. Cx and path pending. Picc placement.  Recommendations See opat note 4 weeks dapto, oral levo and 5 days oral flagyl   Thank you very much for the consult. Will follow with you.  Leonel Ramsay   11/05/2020, 2:51 PM

## 2020-11-06 ENCOUNTER — Encounter: Payer: Self-pay | Admitting: Podiatry

## 2020-11-06 DIAGNOSIS — Z720 Tobacco use: Secondary | ICD-10-CM

## 2020-11-06 LAB — COMPREHENSIVE METABOLIC PANEL
ALT: 13 U/L (ref 0–44)
AST: 14 U/L — ABNORMAL LOW (ref 15–41)
Albumin: 2.7 g/dL — ABNORMAL LOW (ref 3.5–5.0)
Alkaline Phosphatase: 78 U/L (ref 38–126)
Anion gap: 6 (ref 5–15)
BUN: 22 mg/dL — ABNORMAL HIGH (ref 6–20)
CO2: 24 mmol/L (ref 22–32)
Calcium: 8.3 mg/dL — ABNORMAL LOW (ref 8.9–10.3)
Chloride: 108 mmol/L (ref 98–111)
Creatinine, Ser: 1.42 mg/dL — ABNORMAL HIGH (ref 0.44–1.00)
GFR, Estimated: 44 mL/min — ABNORMAL LOW (ref >60)
Glucose, Bld: 376 mg/dL — ABNORMAL HIGH (ref 70–99)
Potassium: 5.6 mmol/L — ABNORMAL HIGH (ref 3.5–5.1)
Sodium: 138 mmol/L (ref 135–145)
Total Bilirubin: 0.7 mg/dL (ref 0.3–1.2)
Total Protein: 7.7 g/dL (ref 6.5–8.1)

## 2020-11-06 LAB — CBC WITH DIFFERENTIAL/PLATELET
Abs Immature Granulocytes: 0.03 10*3/uL (ref 0.00–0.07)
Basophils Absolute: 0 10*3/uL (ref 0.0–0.1)
Basophils Relative: 0 %
Eosinophils Absolute: 0 10*3/uL (ref 0.0–0.5)
Eosinophils Relative: 0 %
HCT: 36.9 % (ref 36.0–46.0)
Hemoglobin: 11.9 g/dL — ABNORMAL LOW (ref 12.0–15.0)
Immature Granulocytes: 0 %
Lymphocytes Relative: 8 %
Lymphs Abs: 0.6 10*3/uL — ABNORMAL LOW (ref 0.7–4.0)
MCH: 31 pg (ref 26.0–34.0)
MCHC: 32.2 g/dL (ref 30.0–36.0)
MCV: 96.1 fL (ref 80.0–100.0)
Monocytes Absolute: 0.3 10*3/uL (ref 0.1–1.0)
Monocytes Relative: 3 %
Neutro Abs: 7 10*3/uL (ref 1.7–7.7)
Neutrophils Relative %: 89 %
Platelets: 301 10*3/uL (ref 150–400)
RBC: 3.84 MIL/uL — ABNORMAL LOW (ref 3.87–5.11)
RDW: 13.1 % (ref 11.5–15.5)
WBC: 8 10*3/uL (ref 4.0–10.5)
nRBC: 0 % (ref 0.0–0.2)

## 2020-11-06 LAB — GLUCOSE, CAPILLARY
Glucose-Capillary: 352 mg/dL — ABNORMAL HIGH (ref 70–99)
Glucose-Capillary: 452 mg/dL — ABNORMAL HIGH (ref 70–99)
Glucose-Capillary: 467 mg/dL — ABNORMAL HIGH (ref 70–99)
Glucose-Capillary: 501 mg/dL (ref 70–99)
Glucose-Capillary: 512 mg/dL (ref 70–99)
Glucose-Capillary: 558 mg/dL (ref 70–99)

## 2020-11-06 LAB — CK: Total CK: 39 U/L (ref 38–234)

## 2020-11-06 MED ORDER — LIRAGLUTIDE 18 MG/3ML ~~LOC~~ SOPN: 1.8000 mg | PEN_INJECTOR | Freq: Every day | SUBCUTANEOUS | Status: DC

## 2020-11-06 MED ORDER — GLIPIZIDE ER 10 MG PO TB24
10.0000 mg | ORAL_TABLET | Freq: Every day | ORAL | Status: DC
  Administered 2020-11-06: 10 mg via ORAL
  Filled 2020-11-06 (×2): qty 1

## 2020-11-06 MED ORDER — SODIUM CHLORIDE 0.9% FLUSH: 10.0000 mL | Freq: Two times a day (BID) | INTRAVENOUS | Status: DC

## 2020-11-06 MED ORDER — INSULIN ASPART 100 UNIT/ML ~~LOC~~ SOLN
40.0000 [IU] | Freq: Once | SUBCUTANEOUS | Status: AC
  Administered 2020-11-06: 40 [IU] via SUBCUTANEOUS
  Filled 2020-11-06: qty 1

## 2020-11-06 MED ORDER — EMPAGLIFLOZIN 25 MG PO TABS
25.0000 mg | ORAL_TABLET | Freq: Every day | ORAL | Status: DC
  Administered 2020-11-06: 25 mg via ORAL
  Filled 2020-11-06: qty 1

## 2020-11-06 MED ORDER — VALACYCLOVIR HCL 500 MG PO TABS
1000.0000 mg | ORAL_TABLET | Freq: Every day | ORAL | Status: DC | PRN
  Filled 2020-11-06: qty 2

## 2020-11-06 MED ORDER — INSULIN ASPART 100 UNIT/ML ~~LOC~~ SOLN: 30.0000 [IU] | Freq: Once | SUBCUTANEOUS | Status: DC

## 2020-11-06 MED ORDER — CHLORHEXIDINE GLUCONATE CLOTH 2 % EX PADS: 6.0000 | MEDICATED_PAD | Freq: Every day | CUTANEOUS | Status: DC

## 2020-11-06 MED ORDER — METRONIDAZOLE 500 MG PO TABS: 500.0000 mg | ORAL_TABLET | Freq: Three times a day (TID) | ORAL | 0 refills | Status: AC

## 2020-11-06 MED ORDER — DAPTOMYCIN IV (FOR PTA / DISCHARGE USE ONLY): 600.0000 mg | INTRAVENOUS | 0 refills | Status: AC

## 2020-11-06 MED ORDER — SODIUM CHLORIDE 0.9% FLUSH: 10.0000 mL | INTRAVENOUS | Status: DC | PRN

## 2020-11-06 MED ORDER — LEVOFLOXACIN 750 MG PO TABS: 750.0000 mg | ORAL_TABLET | Freq: Every day | ORAL | 0 refills | Status: AC

## 2020-11-06 MED ORDER — SODIUM CHLORIDE 0.9 % IV SOLN
600.0000 mg | Freq: Every day | INTRAVENOUS | Status: DC
  Administered 2020-11-06: 600 mg via INTRAVENOUS
  Filled 2020-11-06: qty 12

## 2020-11-06 MED ORDER — INSULIN DETEMIR 100 UNIT/ML ~~LOC~~ SOLN
35.0000 [IU] | Freq: Two times a day (BID) | SUBCUTANEOUS | Status: DC
  Administered 2020-11-06: 35 [IU] via SUBCUTANEOUS
  Filled 2020-11-06 (×4): qty 0.35

## 2020-11-06 MED ORDER — GLIPIZIDE ER 10 MG PO TB24: 10.0000 mg | ORAL_TABLET | Freq: Every day | ORAL | Status: DC

## 2020-11-06 MED ORDER — INSULIN ASPART 100 UNIT/ML ~~LOC~~ SOLN
20.0000 [IU] | Freq: Once | SUBCUTANEOUS | Status: AC
  Administered 2020-11-06: 20 [IU] via SUBCUTANEOUS

## 2020-11-06 NOTE — Consult Note (Addendum)
Pharmacy Antibiotic Note  Donna Fisher is a 55 y.o. female admitted on 11/03/2020 with Right foot infection. Patient with PMH significant for DM and prior toe amputations to RLE.  Pharmacy has been consulted for Daptomycin and aztreonam dosing. 4/18 Xray: + for osteomyelitis  Patient developed hives and mild throat swelling with oral penicillin requiring use of Epi-pen and ED visit. Patient received ceftriaxone in 2018 and developed a rash with associated skin peeling. Patient s/p  debridement & fifth ray amputation of right foot 11/05/2020.  Today, 11/06/2020 Day #3 vanco/aztreonam/metronidazole  Renal: SCr stable  WBC WNL  Afebrile  Bone/tissue culture from OR pending  Plan for daptomycin IV + oral antibiotics as outpatient pending final cultures  CK = 39  Plan:  Daptomycin '600mg'$  IV q24h ('8mg'$ /kg per Adj BW for BMI = 35)  Weekly CK  Continue aztreonam 2 gram Q8H & metronidazole 500 mg IV Q8h  Follow up current blood cultures  Follow up surgical wound cultures  Monitor renal function daily  Height: '5\' 4"'$  (162.6 cm) Weight: 91.2 kg (201 lb) IBW/kg (Calculated) : 54.7  Temp (24hrs), Avg:97.8 F (36.6 C), Min:96.9 F (36.1 C), Max:98.5 F (36.9 C)  Recent Labs  Lab 11/03/20 1406 11/03/20 1424 11/04/20 0421 11/05/20 0449 11/06/20 0444  WBC 15.6*  --  9.4 7.3 8.0  CREATININE 1.83*  --  1.68* 1.49* 1.42*  LATICACIDVEN  --  1.6  --   --   --     Estimated Creatinine Clearance: 49.5 mL/min (A) (by C-G formula based on SCr of 1.42 mg/dL (H)).    Allergies  Allergen Reactions  . Atorvastatin   . Penicillins Swelling    .Has patient had a PCN reaction causing immediate rash, facial/tongue/throat swelling, SOB or lightheadedness with hypotension: No Has patient had a PCN reaction causing severe rash involving mucus membranes or skin necrosis: No Has patient had a PCN reaction that required hospitalization: No Has patient had a PCN reaction occurring within the last  10 years: No If all of the above answers are "NO", then may proceed with Cephalosporin use.  .Has patient had a PCN reaction causing immediate rash, facial/tongue/throat swelling, SOB or lightheadedness with hypotension: No Has patient had a PCN reaction causing severe rash involving mucus membranes or skin necrosis: No Has patient had a PCN reaction that required hospitalization: No Has patient had a PCN reaction occurring within the last 10 years: No If all of the above answers are "NO", then may proceed with Cephalosporin use. .Has patient had a PCN reaction causing immediate rash, facial/tongue/throat swelling, SOB or lightheadedness with hypotension: No Has patient had a PCN reaction causing severe rash involving mucus membranes or skin necrosis: No Has patient had a PCN reaction that required hospitalization: No Has patient had a PCN reaction occurring within the last 10 years: No If all of the above answers are "NO", then may proceed with Cephalosporin use.  . Codeine Itching  . Ceftriaxone Rash    Antimicrobials this admission: 4/18 Vancomycin >> 4/21 to daptomycin  4/18 Aztreonam >>  4/19 Metronidazole >>  Microbiology results: 4/18 BCx: no growth 2 days 4/48 UCx: multiple species present  Thank you for allowing pharmacy to be a part of this patient's care.  Doreene Eland, PharmD, BCPS.   Work Cell: [phone] 11/06/2020 2:13 PM

## 2020-11-06 NOTE — Discharge Summary (Addendum)
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 Diabetic foot with h/o Tobacco abuse  SECONDARY DIAGNOSIS:   Past Medical History:  Diagnosis Date  . Anxiety state, unspecified   . Chronic kidney disease    Stage III B   . Diabetic neuropathy ([place])   . Genital herpes, unspecified   . Headache(784.0)   . Lichen planus   . Mini stroke ([facility])    Hx  . Neuromuscular disorder ([facility])    neuropathy  . Other and unspecified hyperlipidemia   . Pain in joint, pelvic region and thigh   . PONV (postoperative nausea and vomiting)   . Type II or unspecified type diabetes mellitus without mention of complication, uncontrolled   . Urinary tract infection, site not specified   . Vaginitis and vulvovaginitis, unspecified   . Vaginitis and vulvovaginitis, unspecified     HOSPITAL COURSE:  Donna D Beverlyis a 55 y.o.femalewith a past medical history of diabetes, hyperlipidemia, presents to the emergency department for a right foot infection. Patient has received prior toe amputations to the right lower extremity, states over the past several months she has had an ulceration to the lateral aspect of her right foot. She has been following up with podiatry for this but it has gotten worse and now over the weekend patient having fever as high as 102. Patient was seen at podiatry clinic and sent to the emergency  department for admission and antibiotics  Osteomyelitis R fifth MTPJ --Definitive management Dr. Vickki Muff podiatrist-status post right fifth ray amputation and impregnation of vancomycin beads -- ID Dr. Ola Spurr input appreciated. recommends PICC line and IV Daptomycin, po levaquin for 4 weeks and po flagyl for 5 days --Resume aspirin as per surgeon -Clinically Sepsis resolved.  DM Type 2-neuropathy, nephropathy  A1c 8.7 --Sensitive sliding scale --Resumed glipizide XL 10 mg bid, Jardiance and Victoza at discharge --will resume Levemir 80 twice daily--dose confirmed withpt --Continue gabapentin 600 twice daily -- patient's sugars are uncontrolled today given her not getting home dose of her diabetes meds since they were on hold for her surgery. She received extra dose of NovoLog 20 and 40 units.  Chronic hypotension --Probably related to neuropathy continue midodrine 10 mg tid prn  Recent cataract surgery --Resume all drops -- patient will follow-up with ophthalmology tomorrow  HLD - resumed Repatha, statin   urinary incontinence--chronic --cont Detrol LA   Procedures: Family communication :none today Consults :ID, podiatry CODE STATUS: Full DVT Prophylaxis :lovenox Level of care: Med-Surg Status is: Inpatient    Dispo: The patient is from: Home  Anticipated d/c is to: Home  Patient currently is medically stable to d/c.              Difficult to place patient No     CONSULTS OBTAINED:    DRUG ALLERGIES:   Allergies  Allergen Reactions  . Atorvastatin   . Penicillins Swelling    .Has patient had a PCN reaction causing  immediate rash, facial/tongue/throat swelling, SOB or lightheadedness with hypotension: No Has patient had a PCN reaction causing severe rash involving mucus membranes or skin necrosis: No Has patient had a PCN reaction that required hospitalization: No Has patient had a PCN reaction occurring within the  last 10 years: No If all of the above answers are "NO", then may proceed with Cephalosporin use.  .Has patient had a PCN reaction causing immediate rash, facial/tongue/throat swelling, SOB or lightheadedness with hypotension: No Has patient had a PCN reaction causing severe rash involving mucus membranes or skin necrosis: No Has patient had a PCN reaction that required hospitalization: No Has patient had a PCN reaction occurring within the last 10 years: No If all of the above answers are "NO", then may proceed with Cephalosporin use. .Has patient had a PCN reaction causing immediate rash, facial/tongue/throat swelling, SOB or lightheadedness with hypotension: No Has patient had a PCN reaction causing severe rash involving mucus membranes or skin necrosis: No Has patient had a PCN reaction that required hospitalization: No Has patient had a PCN reaction occurring within the last 10 years: No If all of the above answers are "NO", then may proceed with Cephalosporin use.  . Codeine Itching  . Ceftriaxone Rash    DISCHARGE MEDICATIONS:   Allergies as of 11/06/2020      Reactions   Atorvastatin    Penicillins Swelling   .Has patient had a PCN reaction causing immediate rash, facial/tongue/throat swelling, SOB or lightheadedness with hypotension: No Has patient had a PCN reaction causing severe rash involving mucus membranes or skin necrosis: No Has patient had a PCN reaction that required hospitalization: No Has patient had a PCN reaction occurring within the last 10 years: No If all of the above answers are "NO", then may proceed with Cephalosporin use. .Has patient had a PCN reaction causing immediate rash, facial/tongue/throat swelling, SOB or lightheadedness with hypotension: No Has patient had a PCN reaction causing severe rash involving mucus membranes or skin necrosis: No Has patient had a PCN reaction that required hospitalization: No Has patient had a PCN reaction occurring within  the last 10 years: No If all of the above answers are "NO", then may proceed with Cephalosporin use. .Has patient had a PCN reaction causing immediate rash, facial/tongue/throat swelling, SOB or lightheadedness with hypotension: No Has patient had a PCN reaction causing severe rash involving mucus membranes or skin necrosis: No Has patient had a PCN reaction that required hospitalization: No Has patient had a PCN reaction occurring within the last 10 years: No If all of the above answers are "NO", then may proceed with Cephalosporin use.   Codeine Itching   Ceftriaxone Rash      Medication List    TAKE these medications   amitriptyline 25 MG tablet Commonly known as: ELAVIL Take 25 mg by mouth at bedtime as needed.   aspirin EC 325 MG tablet Take 1 tablet (325 mg total) by mouth daily.   calcitRIOL 0.25 MCG capsule Commonly known as: ROCALTROL Take 0.25 mcg by mouth daily.   daptomycin  IVPB Commonly known as: CUBICIN Inject 600 mg into the vein daily for 27 days. Indication: diabetic foot infection and osteomyelitis First Dose: Yes Last Day of Therapy:  12/04/2020 Labs - Once weekly:  CBC/D, CMP, CRP, and CPK Method of administration: IV Push Method of administration may be changed at the discretion of home infusion pharmacist based upon assessment of the patient and/or caregiver's ability to self-administer the medication ordered. Start  taking on: November 07, 2020   Durezol 0.05 % Emul Generic drug: Difluprednate Place 1 drop into the left eye 2 (two) times daily. Breakfast and bedtime   empagliflozin 25 MG Tabs tablet Commonly known as: JARDIANCE Take 25 mg by mouth daily.   EPINEPHrine 0.3 mg/0.3 mL Soaj injection Commonly known as: EPI-PEN Inject 0.3 mg into the muscle daily as needed for anaphylaxis.   ezetimibe 10 MG tablet Commonly known as: ZETIA Take 1 tablet (10 mg total) by mouth daily.   gabapentin 300 MG capsule Commonly known as: NEURONTIN Take 600 mg  by mouth 2 (two) times daily.   glipiZIDE 10 MG 24 hr tablet Commonly known as: GLUCOTROL XL Take 10 mg by mouth in the morning and at bedtime.   hydrOXYzine 25 MG tablet Commonly known as: ATARAX/VISTARIL Take 25 mg by mouth 3 (three) times daily as needed.   Ilevro 0.3 % ophthalmic suspension Generic drug: nepafenac   insulin detemir 100 UNIT/ML injection Commonly known as: LEVEMIR Inject 80 Units into the skin 2 (two) times daily.   levofloxacin 750 MG tablet Commonly known as: Levaquin Take 1 tablet (750 mg total) by mouth daily for 28 days.   liraglutide 18 MG/3ML Sopn Commonly known as: VICTOZA Inject 1.8 mg into the skin at bedtime.   metroNIDAZOLE 500 MG tablet Commonly known as: Flagyl Take 1 tablet (500 mg total) by mouth 3 (three) times daily for 5 days.   midodrine 10 MG tablet Commonly known as: PROAMATINE Take 1 tablet (10 mg total) by mouth 3 (three) times daily as needed (low blood pressure).   moxifloxacin 0.5 % ophthalmic solution Commonly known as: VIGAMOX Place 1 drop into the left eye 4 (four) times daily -  before meals and at bedtime.   Repatha SureClick [id number] MG/ML Soaj Generic drug: Evolocumab Inject 1 mL into the skin every 14 (fourteen) days.   Santyl ointment Generic drug: collagenase Apply topically.   tolterodine 4 MG 24 hr capsule Commonly known as: DETROL LA Take 1 capsule (4 mg total) by mouth daily.   valACYclovir 1000 MG tablet Commonly known as: VALTREX Take 1,000 mg by mouth as needed.            Durable Medical Equipment  (From admission, onward)         Start     Ordered   11/04/20 1438  For home use only DME 4 wheeled rolling walker with seat  Once       Question:  Patient needs a walker to treat with the following condition  Answer:  Weakness   11/04/20 1437           Discharge Care Instructions  (From admission, onward)         Start     Ordered   11/06/20 0000  Change dressing on IV access line  weekly and PRN  (Home infusion instructions - [facility] )        11/06/20 1503          If you experience worsening of your admission symptoms, develop shortness of breath, life threatening emergency, suicidal or homicidal thoughts you must seek medical attention immediately by calling 911 or calling your MD immediately  if symptoms less severe.  You Must read complete instructions/literature along with all the possible adverse reactions/side effects for all the Medicines you take and that have been prescribed to you. Take any new Medicines after you have completely understood and accept all the possible  adverse reactions/side effects.   Please note  You were cared for by a hospitalist during your hospital stay. If you have any questions about your discharge medications or the care you received while you were in the hospital after you are discharged, you can call the unit and asked to speak with the hospitalist on call if the hospitalist that took care of you is not available. Once you are discharged, your primary care physician will handle any further medical issues. Please note that NO REFILLS for any discharge medications will be authorized once you are discharged, as it is imperative that you return to your primary care physician (or establish a relationship with a primary care physician if you do not have one) for your aftercare needs so that they can reassess your need for medications and monitor your lab values. Today   SUBJECTIVE   Anxious to go home. But on the higher side. Patient asymptomatic  VITAL SIGNS:  Blood pressure (!) 123/59, pulse 80, temperature 98.3 F (36.8 C), resp. rate 18, height '5\' 4"'$  (1.626 m), weight 91.2 kg, SpO2 97 %.  I/O:    Intake/Output Summary (Last 24 hours) at 11/06/2020 1511 Last data filed at 11/06/2020 1025 Gross per 24 hour  Intake 920 ml  Output 10 ml  Net 910 ml    PHYSICAL EXAMINATION:  GENERAL:  55 y.o.-year-old patient  lying in the bed with no acute distress. appears chronically ill LUNGS: Normal breath sounds bilaterally, no wheezing, rales,rhonchi or crepitation. No use of accessory muscles of respiration.  CARDIOVASCULAR: S1, S2 normal. No murmurs, rubs, or gallops.  ABDOMEN: Soft, non-tender, non-distended. Bowel sounds present. No organomegaly or mass.  EXTREMITIES: right foot surgical dressing+ left foot chronic post surgical changes + NEUROLOGIC:groslly non focal PSYCHIATRIC: The patient is alert and oriented x 3.  SKIN: No obvious rash, lesion  DATA REVIEW:   CBC  Recent Labs  Lab 11/06/20 0444  WBC 8.0  HGB 11.9*  HCT 36.9  PLT 301    Chemistries  Recent Labs  Lab 11/06/20 0444  NA 138  K 5.6*  CL 108  CO2 24  GLUCOSE 376*  BUN 22*  CREATININE 1.42*  CALCIUM 8.3*  AST 14*  ALT 13  ALKPHOS 78  BILITOT 0.7    Microbiology Results   Recent Results (from the past 240 hour(s))  Blood Culture (routine x 2)     Status: None (Preliminary result)   Collection Time: 11/03/20  2:06 PM   Specimen: BLOOD  Result Value Ref Range Status   Specimen Description BLOOD BLOOD LEFT FOREARM  Final   Special Requests   Final    BOTTLES DRAWN AEROBIC AND ANAEROBIC Blood Culture adequate volume   Culture   Final    NO GROWTH 3 DAYS Performed at [facility], [address] [place], [place] [address]    Report Status PENDING  Incomplete  Blood Culture (routine x 2)     Status: None (Preliminary result)   Collection Time: 11/03/20  2:11 PM   Specimen: BLOOD  Result Value Ref Range Status   Specimen Description BLOOD BLOOD LEFT WRIST  Final   Special Requests   Final    BOTTLES DRAWN AEROBIC AND ANAEROBIC Blood Culture adequate volume   Culture   Final    NO GROWTH 3 DAYS Performed at [facility], [address] [place], [place] [address]    Report Status PENDING  Incomplete  Resp Panel by RT-PCR (Flu A&B, Covid) Nasopharyngeal  Swab     Status: None    Collection Time: 11/03/20  2:24 PM   Specimen: Nasopharyngeal Swab; Nasopharyngeal(NP) swabs in vial transport medium  Result Value Ref Range Status   SARS Coronavirus 2 by RT PCR NEGATIVE NEGATIVE Final    Comment: (NOTE) SARS-CoV-2 target nucleic acids are NOT DETECTED.  The SARS-CoV-2 RNA is generally detectable in upper respiratory specimens during the acute phase of infection. The lowest concentration of SARS-CoV-2 viral copies this assay can detect is 138 copies/mL. A negative result does not preclude SARS-Cov-2 infection and should not be used as the sole basis for treatment or other patient management decisions. A negative result may occur with  improper specimen collection/handling, submission of specimen other than nasopharyngeal swab, presence of viral mutation(s) within the areas targeted by this assay, and inadequate number of viral copies(<138 copies/mL). A negative result must be combined with clinical observations, patient history, and epidemiological information. The expected result is Negative.  Fact Sheet for Patients:  EntrepreneurPulse.com.au  Fact Sheet for Healthcare Providers:  IncredibleEmployment.be  This test is no t yet approved or cleared by the [place] FDA and  has been authorized for detection and/or diagnosis of SARS-CoV-2 by FDA under an Emergency Use Authorization (EUA). This EUA will remain  in effect (meaning this test can be used) for the duration of the COVID-19 declaration under Section 564(b)(1) of the Act, 21 U.S.C.section 360bbb-3(b)(1), unless the authorization is terminated  or revoked sooner.       Influenza A by PCR NEGATIVE NEGATIVE Final   Influenza B by PCR NEGATIVE NEGATIVE Final    Comment: (NOTE) The Xpert Xpress SARS-CoV-2/FLU/RSV plus assay is intended as an aid in the diagnosis of influenza from Nasopharyngeal swab specimens and should not be used as a sole basis for treatment.  Nasal washings and aspirates are unacceptable for Xpert Xpress SARS-CoV-2/FLU/RSV testing.  Fact Sheet for Patients: EntrepreneurPulse.com.au  Fact Sheet for Healthcare Providers: IncredibleEmployment.be  This test is not yet approved or cleared by the [place] FDA and has been authorized for detection and/or diagnosis of SARS-CoV-2 by FDA under an Emergency Use Authorization (EUA). This EUA will remain in effect (meaning this test can be used) for the duration of the COVID-19 declaration under Section 564(b)(1) of the Act, 21 U.S.C. section 360bbb-3(b)(1), unless the authorization is terminated or revoked.  Performed at [facility], [address] [place], [place] [address]   Urine culture     Status: Abnormal   Collection Time: 11/03/20  2:24 PM   Specimen: Urine, Random  Result Value Ref Range Status   Specimen Description   Final    URINE, RANDOM Performed at [facility], [address], [place], [place] [address]    Special Requests   Final    NONE Performed at [facility], [address] [place], [place] [address]    Culture MULTIPLE SPECIES PRESENT, SUGGEST RECOLLECTION (A)  Final   Report Status 11/05/2020 FINAL  Final    RADIOLOGY:  DG MINI C-ARM IMAGE ONLY  Result Date: 11/05/2020 There is no interpretation for this exam.  This order is for images obtained during a surgical procedure.  Please See "Surgeries" Tab for more information regarding the procedure.   [place] EKG SITE RITE  Result Date: 11/05/2020 If Site Rite image not attached, placement could not be confirmed due to current cardiac rhythm.    CODE STATUS:     Code Status Orders  (From admission, onward)  Start     Ordered   11/03/20 1636  Full code  Continuous        11/03/20 1638        Code Status History    Date Active Date Inactive Code Status Order ID Comments User Context   05/27/2019 2346 05/29/2019  2111 Full Code [id number]  Orene Desanctis, DO ED   01/15/2019 1055 01/17/2019 1747 Full Code [id number]  Saundra Shelling, MD Inpatient   12/31/2016 1132 01/14/2017 0028 Full Code [id number]  Hillary Bow, MD ED   Advance Care Planning Activity       TOTAL TIME TAKING CARE OF THIS PATIENT: *40* minutes.    Fritzi Mandes M.D  [facility]    CC: Primary care physician; Frazier Richards, MD

## 2020-11-06 NOTE — Progress Notes (Signed)
Patients blood sugar is 452 6  Units administered at this time. Sharion Settler notified at this time no new orders at this time. Husband brought chic filet and a large lemonade that is gone after surgery.

## 2020-11-06 NOTE — Progress Notes (Signed)
Daily Progress Note   Subjective  - 1 Day Post-Op  Follow-up fifth ray amputation right foot.  Pain was minimal overnight.  Objective Vitals:   11/06/20 0032 11/06/20 0442 11/06/20 0755 11/06/20 1128  BP: 133/73 123/73 121/65 (!) 123/59  Pulse: 78 67 75 80  Resp: '18 18 19 18  '$ Temp: 97.6 F (36.4 C) 97.6 F (36.4 C) 98.3 F (36.8 C) 98.3 F (36.8 C)  TempSrc:      SpO2: 97% 97% 97% 97%  Weight:      Height:        Physical Exam: The incision is completely coapted.  Good perfusion of the skin flaps.  No signs of stress.  No drainage  Laboratory CBC    Component Value Date/Time   WBC 8.0 11/06/2020 0444   HGB 11.9 (L) 11/06/2020 0444   HGB 12.2 05/10/2017 1001   HCT 36.9 11/06/2020 0444   HCT 35.5 05/10/2017 1001   PLT 301 11/06/2020 0444   PLT 267 05/10/2017 1001    BMET    Component Value Date/Time   NA 138 11/06/2020 0444   NA 136 11/03/2014 0521   K 5.6 (H) 11/06/2020 0444   K 3.9 11/03/2014 0521   CL 108 11/06/2020 0444   CL 105 11/03/2014 0521   CO2 24 11/06/2020 0444   CO2 29 11/03/2014 0521   GLUCOSE 376 (H) 11/06/2020 0444   GLUCOSE 281 (H) 11/03/2014 0521   BUN 22 (H) 11/06/2020 0444   BUN 18 11/03/2014 0521   CREATININE 1.42 (H) 11/06/2020 0444   CREATININE 0.87 11/03/2014 0521   CALCIUM 8.3 (L) 11/06/2020 0444   CALCIUM 8.0 (L) 11/03/2014 0521   GFRNONAA 44 (L) 11/06/2020 0444   GFRNONAA >60 11/03/2014 0521   GFRAA 39 (L) 05/29/2019 0831   GFRAA >60 11/03/2014 0521    Assessment/Planning: Osteomyelitis status post fifth ray amputation   At this point should remain nonweightbearing but she can do heel weightbearing for transfer.  New dressing applied today.  She is awaiting a PICC line to be placed.  Once the PICC is in place and antibiotics have been cited on for discharge from my standpoint patient is stable for discharge.  No dressing changes needed for 1 week unless the dressing becomes saturated or become soiled.  They can change  with a dry gauze dressing.  Follow-up with me in 1 week  Elesa Hacker  11/06/2020, 12:07 PM

## 2020-11-06 NOTE — Discharge Instructions (Signed)
PICC line per protocol   Per Dr Vickki Muff :No dressing changes needed for 1 week unless the dressing becomes saturated or become soiled.  They can change with a dry gauze dressing.A  t this point should remain nonweightbearing but she can do heel weightbearing for transfer

## 2020-11-06 NOTE — Evaluation (Signed)
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  Clinical Impression  Upon evaluation, patient alert and oriented; follows commands and agreeable to participation with session. Denies pain in R LE; post-op dressing and ace wrap intact and dry.  Bilat UE/LE strength and ROM grossly symmetrical and WFL; no focal weakness appreciated.  Able to complete bed mobility with mod indep; sit/stand, basic transfers (SPT to/from 4WRW) with cga/close sup; good ability to maintain heel WBing R LE. 4WRW present in room upon arrival to evaluation; patient reports it was "brought to me and recommended by the lady from [place]".  Discussed concerns with 4WRW (and various other options-RW with nail apron, manual WC, knee scooter); however, patient voicing plan and preference to utilize 4WRW as transport/WC, self-propelling with LEs in seated position when needing to move over distances.  Extensive education about safety needs, use, positioning, skin protection to L foot if electing this method of transportation.  Patient/significant other voiced understanding and agreement. Would benefit from skilled PT to address above deficits and promote optimal return to PLOF.; provided HEP for use immediately upon discharge.  Recommend transition to HHPT once WBing status advanced and progressive mobility appropriate.     Follow Up Recommendations  (To be initiated per podiatrist once WBing status advnaced)    Equipment Recommendations       Recommendations for Other Services       Precautions / Restrictions Precautions Precautions: Fall Restrictions Weight Bearing Restrictions: Yes RLE Weight Bearing:  Non weight bearing Other Position/Activity Restrictions: heel WBing R LE with transfers      Mobility  Bed Mobility Overal bed mobility: Modified Independent                  Transfers Overall transfer level: Needs assistance   Transfers: Sit to/from Stand;Stand Pivot Transfers Sit to Stand: Min guard;Supervision         General transfer comment: to/from 4WRW  Ambulation/Gait             General Gait Details: declined, preferring to utilize 4WRW as manual WC until physician advances WBing restrictions  Stairs            Wheelchair Mobility    Modified Rankin (Stroke Patients Only)       Balance Overall balance assessment: Needs assistance Sitting-balance support: No upper extremity supported;Feet supported Sitting balance-Leahy Scale: Good     Standing balance support: Bilateral upper extremity supported Standing balance-Leahy Scale: Fair                               Pertinent Vitals/Pain Pain Assessment: No/denies pain    Home Living Family/patient expects to be discharged to:: Private residence Living Arrangements: Spouse/significant other Available Help at Discharge: Family Type of Home: House Home Access: Ramped entrance     Home Layout: One level Home Equipment: None      Prior Function Level of Independence: Independent with assistive device(s)         Comments: Ambulatory with SPC prior to admission     Hand Dominance        Extremity/Trunk Assessment   Upper Extremity Assessment Upper Extremity Assessment: Overall WFL for tasks  assessed    Lower Extremity Assessment Lower Extremity Assessment: Overall WFL for tasks assessed (R foot with post-op dressing, ace bandage intat, ankle ROM grossly WFL)       Communication   Communication: No difficulties  Cognition Arousal/Alertness: Awake/alert Behavior During Therapy: WFL for tasks assessed/performed Overall Cognitive Status: Within Functional  Limits for tasks assessed                                        General Comments      Exercises Other Exercises Other Exercises: 4WRW present in room upon arrival to evaluation; patient reports it was "brought to me and recommended by the lady from [place]".  Discussed concerns with 4WRW (and various other options-RW with nail apron, manual WC, knee scooter); however, patient voicing plan and preference to utilize 4WRW as transport/WC, self-propelling with LEs in seated position when needing to move over distances. Other Exercises: Demonstrates safe transfer technique for sit/stand, SPT to/from 4WRW with close sup; good ability to maintain heel WBing R LE. Other Exercises: Reviewed WBing and mobility restrictions, importance of skin protection to L foot (wearing shoe at all times), safety needs and set up/positioning of 4WRW, car transfer technique.  Patient and significant other voiced understanding of all information. Other Exercises: Issued handout with written and pictorial descriptions of seated and supine UE/LE therex for use as HEP.  Encouraged peformance of therex until physician advances WBing and more progressive therapy warranted.  Patient/significant other voiced understanding.   Assessment/Plan    PT Assessment Patient needs continued PT services  PT Problem List Decreased strength;Decreased activity tolerance;Decreased balance;Decreased mobility;Decreased knowledge of precautions;Decreased knowledge of use of DME;Decreased safety awareness       PT Treatment Interventions DME instruction;Gait training;Functional mobility training;Therapeutic activities;Therapeutic exercise;Balance training;Patient/family education    PT Goals (Current goals can be found in the Care Plan section)  Acute Rehab PT Goals Patient Stated Goal: to return home PT Goal Formulation: With patient/family Time For Goal Achievement: 11/20/20 Potential to Achieve Goals: Good    Frequency  Min 2X/week   Barriers to discharge        Co-evaluation               AM-PAC PT "6 Clicks" Mobility  Outcome Measure Help needed turning from your back to your side while in a flat bed without using bedrails?: None Help needed moving from lying on your back to sitting on the side of a flat bed without using bedrails?: None Help needed moving to and from a bed to a chair (including a wheelchair)?: A Little Help needed standing up from a chair using your arms (e.g., wheelchair or bedside chair)?: A Little Help needed to walk in hospital room?: A Little Help needed climbing 3-5 steps with a railing? : A Lot 6 Click Score: 19    End of Session   Activity Tolerance: Patient tolerated treatment well Patient left: in bed;with bed alarm set;with call bell/phone within reach;with family/visitor present Nurse Communication: Mobility status PT Visit Diagnosis: Muscle weakness (generalized) (M62.81);Difficulty in walking, not elsewhere classified (R26.2)    Time: [id number] PT Time Calculation (min) (ACUTE ONLY): 37 min   Charges:   PT Evaluation $PT Eval Moderate Complexity: 1 Mod PT Treatments $Therapeutic Activity: 23-37 mins       Ukiah Trawick H. Owens Shark, PT, DPT, NCS 11/06/20, 5:11 PM [phone]

## 2020-11-06 NOTE — Progress Notes (Signed)
Peripherally Inserted Central Catheter Placement  The IV Nurse has discussed with the patient and/or persons authorized to consent for the patient, the purpose of this procedure and the potential benefits and risks involved with this procedure.  The benefits include less needle sticks, lab draws from the catheter, and the patient may be discharged home with the catheter. Risks include, but not limited to, infection, bleeding, blood clot (thrombus formation), and puncture of an artery; nerve damage and irregular heartbeat and possibility to perform a PICC exchange if needed/ordered by physician.  Alternatives to this procedure were also discussed.  Bard Power PICC patient education guide, fact sheet on infection prevention and patient information card has been provided to patient /or left at bedside.    PICC Placement Documentation  PICC Single Lumen [id number] Right Basilic 38 cm 0 cm (Active)  Indication for Insertion or Continuance of Line Home intravenous therapies (PICC only) 11/06/20 1300  Exposed Catheter (cm) 0 cm 11/06/20 1300  Site Assessment Clean;Dry;Intact 11/06/20 1300  Line Status Flushed;Blood return noted 11/06/20 1300  Dressing Type Transparent 11/06/20 1300  Dressing Status Clean;Dry;Intact 11/06/20 1300  Antimicrobial disc in place? Yes 11/06/20 1300  Dressing Change Due 11/13/20 11/06/20 1300       Jule Economy Horton 11/06/2020, 1:51 PM

## 2020-11-06 NOTE — Progress Notes (Signed)
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 Atorvastatin   . Penicillins Swelling    .Has patient had a PCN reaction causing immediate rash, facial/tongue/throat swelling, SOB or lightheadedness with hypotension: No Has patient had a PCN reaction causing severe rash involving mucus membranes or skin necrosis: No Has patient had a PCN reaction that required hospitalization: No Has patient had a PCN reaction occurring within the last 10 years: No If all of the above answers are "NO", then may proceed with Cephalosporin use.  .Has patient had a PCN reaction causing immediate rash, facial/tongue/throat swelling, SOB or lightheadedness with hypotension: No Has patient had a PCN reaction causing severe rash involving mucus membranes or skin necrosis: No Has patient had a PCN reaction that required hospitalization: No Has patient had a PCN reaction occurring within the last 10 years: No If all of the above answers are "NO", then may proceed with Cephalosporin use. .Has patient had a PCN reaction causing immediate rash, facial/tongue/throat swelling, SOB or lightheadedness with hypotension: No Has patient had a PCN reaction causing severe rash involving mucus membranes or skin necrosis: No Has patient had a PCN reaction that required hospitalization: No Has patient had a PCN reaction occurring within the last 10 years: No If all of the above answers are "NO", then may proceed  with Cephalosporin use.  . Codeine Itching  . Ceftriaxone Rash    Discharge antibiotics Daptomycin 8 mg/kg mg every  24 hours   (weekly CPK) Oral Levofloxacin 750 mg qd  Oral flagyl 500 mg TID for 5 days then stop  PICC Care per protocol Labs weekly while on IV antibiotics -FAX weekly labs to [phone] CBC w diff   Comprehensive met panel CPK    CRP   Planned duration of antibiotics 4 weeks  Stop date May 19th  Follow up clinic date 2-3 weeks   Leonel Ramsay, MD

## 2020-11-06 NOTE — Progress Notes (Signed)
PHARMACY CONSULT NOTE FOR:  OUTPATIENT  PARENTERAL ANTIBIOTIC THERAPY (OPAT)  Indication: diabetic foot infection and osteomyelitis Regimen: daptomycin '600mg'$  IV q24h (~'8mg'$ /kg per Adj BW for BMI = 35) End date: 12/04/2020  Patient also to be discharged on levofloxacin '750mg'$  PO q24h x 4weeks and metronidazole PO x 5 days  IV antibiotic discharge orders are pended. To discharging provider:  please sign these orders via discharge navigator,  Select New Orders & click on the button choice - Manage This Unsigned Work.     Thank you for allowing pharmacy to be a part of this patient's care.  Doreene Eland, PharmD, BCPS.   Work Cell: [phone] 11/06/2020 2:41 PM

## 2020-11-06 NOTE — Progress Notes (Signed)
Patient ID: Donna Fisher, female   DOB: 10-06-1965, 55 y.o.   MRN: [id number] discussed with patient that her sugars are improving now. She is received total of 60 unit NovoLog and her diabetes meds including insulin Lantus 35 units. Patient understands and knows how to manage her high sugars. She will continue to take her 80 units BID Lantus starting tonight along with her oral meds and injection victoza. She is recommended to keep log of her sugars and review with primary care physician. She is anxious to go home and will discharge her with outpatient follow-up podiatry PCP and infectious disease.

## 2020-11-06 NOTE — Progress Notes (Signed)
MD notified that pts sugar is 501 this AM. Insulin administered per order. Medications added and given by RN. MD stated no order for lab draw needed at this time. Pt asymptomatic. RN will continue to monitor.

## 2020-11-06 NOTE — TOC Progression Note (Signed)
Transition of Care [facility]) - Progression Note    Patient Details  Name: Donna Fisher MRN: [id number] Date of Birth: September 07, 1965  Transition of Care [facility]) CM/SW Contact  Su Hilt, RN Phone Number: 11/06/2020, 10:31 AM  Clinical Narrative:   Spoke with the patient and reviewed [facility] is set up for [facility], [place] with [facility] will come teach about Home ABX, the patient has a rollator and will be ordering a knee scooter, she would like a 3 in 1, to be brought, I notified Suanne Marker with [employer] She has transportation and does not have any other needs         Expected Discharge Plan and Services                                                 Social Determinants of Health (SDOH) Interventions    Readmission Risk Interventions No flowsheet data found.

## 2020-11-06 NOTE — Progress Notes (Addendum)
Dr. Posey Pronto notified pts blood sugar is 558 at this time. All scheduled medications have been administered and order placed for insulin now. Pt asymptomatic at this time. No lab draw required at this time per MD. RN will continue to monitor.

## 2020-11-06 NOTE — Progress Notes (Signed)
Pt provided discharge instructions. PICC line teaching completed. Prescriptions provided. VSS.   11/06/20 1556  Vitals  Temp 98.1 F (36.7 C)  Temp Source Oral  BP 108/61  MAP (mmHg) 76  BP Location Left Arm  BP Method Automatic  Patient Position (if appropriate) Sitting  Pulse Rate 87  Pulse Rate Source Monitor  Resp 18  MEWS COLOR  MEWS Score Color Green  Oxygen Therapy  SpO2 97 %  O2 Device Room Air

## 2020-11-08 LAB — CULTURE, BLOOD (ROUTINE X 2)
Culture: NO GROWTH
Culture: NO GROWTH
Special Requests: ADEQUATE
Special Requests: ADEQUATE

## 2020-11-10 LAB — SURGICAL PATHOLOGY

## 2020-11-11 LAB — AEROBIC/ANAEROBIC CULTURE W GRAM STAIN (SURGICAL/DEEP WOUND)

## 2020-11-13 ENCOUNTER — Encounter: Payer: Medicare Other | Admitting: Certified Nurse Midwife

## 2020-11-14 ENCOUNTER — Ambulatory Visit (INDEPENDENT_AMBULATORY_CARE_PROVIDER_SITE_OTHER): Payer: Medicare HMO | Admitting: Certified Nurse Midwife

## 2020-11-14 ENCOUNTER — Other Ambulatory Visit: Payer: Self-pay

## 2020-11-14 ENCOUNTER — Encounter: Payer: Self-pay | Admitting: Certified Nurse Midwife

## 2020-11-14 DIAGNOSIS — N183 Chronic kidney disease, stage 3 unspecified: Secondary | ICD-10-CM | POA: Diagnosis not present

## 2020-11-14 DIAGNOSIS — Z79899 Other long term (current) drug therapy: Secondary | ICD-10-CM | POA: Diagnosis not present

## 2020-11-14 DIAGNOSIS — N3946 Mixed incontinence: Secondary | ICD-10-CM | POA: Diagnosis not present

## 2020-11-14 DIAGNOSIS — E1122 Type 2 diabetes mellitus with diabetic chronic kidney disease: Secondary | ICD-10-CM

## 2020-11-14 MED ORDER — TOLTERODINE TARTRATE ER 2 MG PO CP24: 4.0000 mg | ORAL_CAPSULE | Freq: Every day | ORAL | 5 refills | Status: DC

## 2020-11-14 MED ORDER — TOLTERODINE TARTRATE ER 2 MG PO CP24: 2.0000 mg | ORAL_CAPSULE | Freq: Every day | ORAL | 5 refills | Status: DC

## 2020-11-14 NOTE — Progress Notes (Signed)
Virtual Visit via Telephone Note  I connected with Donna Fisher on 11/14/20 at  4:15 PM EDT by telephone and verified that I am speaking with the correct person using two identifiers.  Location:  Patient: Donna Fisher (home)  Provider: Dani Gobble, CNM ([facility], [facility])   I discussed the limitations, risks, security and privacy concerns of performing an evaluation and management service by telephone and the availability of in person appointments. I also discussed with the patient that there may be a patient responsible charge related to this service. The patient expressed understanding and agreed to proceed.   History of Present Illness:  Patient requests refill of Detrol LA, notes continued periods of incontinence but wishes to remain on medications at this time.   Follow by Dr. Lanora Manis for Chronic Kidney Disease.    Observations/Objective:  CrCl: 31.2-39.4  Assessment:  .Mixed incontinence urge and stress - Plan: tolterodine (DETROL LA) 2 MG 24 hr capsule, DISCONTINUED: tolterodine (DETROL LA) 2 MG 24 hr capsule  Stage 3 chronic kidney disease due to diabetes mellitus ([place])  Medication management   Plan:  Rx Detrol 2 mg daily, see orders.   Patient requested information regarding bladder surgery sent via mail.   Reviewed red flag symptoms and when to call.   RTC as previously scheduled or sooner if needed.   Follow Up Instructions:    I discussed the assessment and treatment plan with the patient. The patient was provided an opportunity to ask questions and all were answered. The patient agreed with the plan and demonstrated an understanding of the instructions.   The patient was advised to call back or seek an in-person evaluation if the symptoms worsen or if the condition fails to improve as anticipated.  I provided 6 minutes of non-face-to-face time during this encounter.   Dani Gobble, CNM [facility],  [facility] 11/14/20 4:37 PM

## 2020-11-14 NOTE — Patient Instructions (Signed)
Tolterodine tablets What is this medicine? TOLTERODINE (tole TER a deen) is used to treat overactive bladder. This medicine reduces the amount of bathroom visits. It may also help to control wetting accidents. This medicine may be used for other purposes; ask your health care provider or pharmacist if you have questions. COMMON BRAND NAME(S): Detrol What should I tell my health care provider before I take this medicine? They need to know if you have any of these conditions:  difficulty passing urine  glaucoma  intestinal obstruction  irregular heartbeat or you have a family member with irregular heartbeat  kidney disease  liver disease  myasthenia gravis  an unusual or allergic reaction to tolterodine, fesoterodine, other medicines, foods, dyes, or preservatives  pregnant or trying to get pregnant  breast-feeding How should I use this medicine? Take this medicine by mouth with a glass of water. Follow the directions on the prescription label. Take your doses at regular intervals. Do not take your medicine more often than directed. Talk to your pediatrician regarding the use of this medicine in children. Special care may be needed. Overdosage: If you think you have taken too much of this medicine contact a poison control center or emergency room at once. NOTE: This medicine is only for you. Do not share this medicine with others. What if I miss a dose? If you miss a dose, take it as soon as you can. If it is almost time for your next dose, take only that dose. Do not take double or extra doses. What may interact with this medicine?  clarithromycin  cyclosporine  erythromycin  fluoxetine  medicines for fungal infections, like fluconazole, itraconazole, ketoconazole or voriconazole  vinblastine This list may not describe all possible interactions. Give your health care provider a list of all the medicines, herbs, non-prescription drugs, or dietary supplements you use. Also  tell them if you smoke, drink alcohol, or use illegal drugs. Some items may interact with your medicine. What should I watch for while using this medicine? It may take 2 or 3 months to notice the full benefit from this medicine. You may need to limit your intake tea, coffee, caffeinated sodas, and alcohol. These drinks may make your symptoms worse. You may get drowsy or dizzy. Do not drive, use machinery, or do anything that needs mental alertness until you know how this drug affects you. Do not stand or sit up quickly, especially if you are an older patient. This reduces the risk of dizzy or fainting spells. Your mouth may get dry. Chewing sugarless gum or sucking hard candy, and drinking plenty of water may help. Contact your doctor if the problem does not go away or is severe. This medicine may cause dry eyes and blurred vision. If you wear contact lenses you may feel some discomfort. Lubricating drops may help. See your eye doctor if the problem does not go away or is severe. Avoid extreme heat. This medicine can cause you to sweat less than normal. Your body temperature could increase to dangerous levels, which may lead to heat stroke. What side effects may I notice from receiving this medicine? Side effects that you should report to your doctor or health care professional as soon as possible:  allergic reactions like skin rash, itching or hives, swelling of the face, lips, or tongue  breathing problems  confusion  difficulty passing urine  fast, irregular heartbeat  hallucinations  swelling in feet, hands Side effects that usually do not require medical attention (report to  your doctor or health care professional if they continue or are bothersome):  changes in vision  constipation  dry eyes, mouth  headache  dizziness, drowsiness  stomach upset This list may not describe all possible side effects. Call your doctor for medical advice about side effects. You may report side  effects to FDA at 1-800-FDA-1088. Where should I keep my medicine? Keep out of the reach of children. Store at room temperature between 15 and 30 degrees C (59 and 86 degrees F). Throw away any unused medicine after the expiration date. NOTE: This sheet is a summary. It may not cover all possible information. If you have questions about this medicine, talk to your doctor, pharmacist, or health care provider.  2021 Elsevier/Gold Standard (2010-04-14 17:19:08)

## 2020-12-09 ENCOUNTER — Inpatient Hospital Stay: Admission: RE | Admit: 2020-12-09 | Payer: Medicare HMO | Source: Ambulatory Visit

## 2020-12-10 ENCOUNTER — Other Ambulatory Visit: Payer: Self-pay

## 2020-12-10 ENCOUNTER — Other Ambulatory Visit
Admission: RE | Admit: 2020-12-10 | Discharge: 2020-12-10 | Disposition: A | Discharge status: Home / Self Care | Payer: Medicare HMO | Source: Ambulatory Visit | Attending: Acute Care | Admitting: Acute Care

## 2020-12-10 HISTORY — DX: Personal history of urinary calculi: Z87.442

## 2020-12-10 HISTORY — DX: Pneumonia, unspecified organism: J18.9

## 2020-12-10 HISTORY — DX: Cerebral infarction, unspecified: I63.9

## 2020-12-10 NOTE — Patient Instructions (Addendum)
Your procedure is scheduled on: 12/19/20 - Friday Report to the Registration Desk on the 1st floor of the [facility]. To find out your arrival time, please call [phone] between 1PM - 3PM on: 12/18/20 - Thursday  REMEMBER: Instructions that are not followed completely may result in serious medical risk, up to and including death; or upon the discretion of your surgeon and anesthesiologist your surgery may need to be rescheduled.  Do not eat food after midnight the night before surgery.  No gum chewing, lozengers or hard candies.  You may however, drink CLEAR liquids up to 2 hours before you are scheduled to arrive for your surgery. Do not drink anything within 2 hours of your scheduled arrival time. Type 1 or Type 2 diabetic can only drink water.  TAKE ONLYTHESE MEDICATIONS THE MORNING OF SURGERY WITH A SIP OF WATER:  - ezetimibe (ZETIA) 10 MG tablet - gabapentin (NEURONTIN) 600 MG tablet - tolterodine (DETROL LA) 2 MG 24 hr capsule  Take 1/2 of usual insulin dose the night before surgery and none on the morning of surgery.  - Do not take Victoza the night before surgery.  Follow recommendations from Cardiologist, Pulmonologist or PCP regarding stopping Aspirin, Coumadin, Plavix, Eliquis, Pradaxa, or Pletal. Will continue Aspirin per Dr. Vickki Muff , but will not take the morning of surgery.  One week prior to surgery: Stop Anti-inflammatories (NSAIDS) such as Advil, Aleve, Ibuprofen, Motrin, Naproxen, Naprosyn and Aspirin based products such as Excedrin, Goodys Powder, BC Powder.You may however, continue to take Tylenol if needed for pain up until the day of surgery.  Stop ANY OVER THE COUNTER supplements until after surgery.  No Alcohol for 24 hours before or after surgery.  No Smoking including e-cigarettes for 24 hours prior to surgery.  No chewable tobacco products for at least 6 hours prior to surgery.  No nicotine patches on the day of surgery.  Do not use any  "recreational" drugs for at least a week prior to your surgery.  Please be advised that the combination of cocaine and anesthesia may have negative outcomes, up to and including death. If you test positive for cocaine, your surgery will be cancelled.  On the morning of surgery brush your teeth with toothpaste and water, you may rinse your mouth with mouthwash if you wish. Do not swallow any toothpaste or mouthwash.  Do not wear jewelry, make-up, hairpins, clips or nail polish.  Do not wear lotions, powders, or perfumes.   Do not shave body from the neck down 48 hours prior to surgery just in case you cut yourself which could leave a site for infection.  Also, freshly shaved skin may become irritated if using the CHG soap.  Contact lenses, hearing aids and dentures may not be worn into surgery.  Do not bring valuables to the hospital. [facility] is not responsible for any missing/lost belongings or valuables.   Notify your doctor if there is any change in your medical condition (cold, fever, infection).  Wear comfortable clothing (specific to your surgery type) to the hospital.  Plan for stool softeners for home use; pain medications have a tendency to cause constipation. You can also help prevent constipation by eating foods high in fiber such as fruits and vegetables and drinking plenty of fluids as your diet allows.  After surgery, you can help prevent lung complications by doing breathing exercises.  Take deep breaths and cough every 1-2 hours. Your doctor may order a device called an Chiropodist  to help you take deep breaths. When coughing or sneezing, hold a pillow firmly against your incision with both hands. This is called "splinting." Doing this helps protect your incision. It also decreases belly discomfort.  If you are being admitted to the hospital overnight, leave your suitcase in the car. After surgery it may be brought to your room.  If you are being discharged  the day of surgery, you will not be allowed to drive home. You will need a responsible adult (18 years or older) to drive you home and stay with you that night.   If you are taking public transportation, you will need to have a responsible adult (18 years or older) with you. Please confirm with your physician that it is acceptable to use public transportation.   Please call the [place] Dept. at [phone] if you have any questions about these instructions.  Surgery Visitation Policy:  Patients undergoing a surgery or procedure may have one family member or support person with them as long as that person is not COVID-19 positive or experiencing its symptoms.  That person may remain in the waiting area during the procedure.  Inpatient Visitation:    Visiting hours are 7 a.m. to 8 p.m. Inpatients will be allowed two visitors daily. The visitors may change each day during the patient's stay. No visitors under the age of 88. Any visitor under the age of 44 must be accompanied by an adult. The visitor must pass COVID-19 screenings, use hand sanitizer when entering and exiting the patient's room and wear a mask at all times, including in the patient's room. Patients must also wear a mask when staff or their visitor are in the room. Masking is required regardless of vaccination status.  Total Hip/Knee Replacement Preoperative Educational Video  To better prepare for surgery, please view our videos that explain the physical activity and discharge planning required to have the best surgical recovery at [facility].  [email or web address]      Questions? Call [phone] or email [email or web address]

## 2020-12-12 ENCOUNTER — Other Ambulatory Visit: Payer: Self-pay | Admitting: Podiatry

## 2020-12-18 ENCOUNTER — Inpatient Hospital Stay: Admission: RE | Admit: 2020-12-18 | Payer: Medicare HMO | Source: Ambulatory Visit

## 2020-12-19 ENCOUNTER — Encounter
Admission: RE | Disposition: A | Discharge status: Home / Self Care | Payer: Self-pay | Source: Home / Self Care | Attending: Podiatry

## 2020-12-19 ENCOUNTER — Other Ambulatory Visit: Payer: Self-pay

## 2020-12-19 ENCOUNTER — Encounter: Payer: Self-pay | Admitting: Podiatry

## 2020-12-19 ENCOUNTER — Ambulatory Visit: Payer: Medicare HMO | Admitting: Anesthesiology

## 2020-12-19 ENCOUNTER — Ambulatory Visit
Admission: RE | Admit: 2020-12-19 | Discharge: 2020-12-19 | Disposition: A | Discharge status: Home / Self Care | Payer: Medicare HMO | Attending: Podiatry | Admitting: Podiatry

## 2020-12-19 DIAGNOSIS — Z79899 Other long term (current) drug therapy: Secondary | ICD-10-CM | POA: Diagnosis not present

## 2020-12-19 DIAGNOSIS — Z9049 Acquired absence of other specified parts of digestive tract: Secondary | ICD-10-CM | POA: Diagnosis not present

## 2020-12-19 DIAGNOSIS — Z7982 Long term (current) use of aspirin: Secondary | ICD-10-CM | POA: Insufficient documentation

## 2020-12-19 DIAGNOSIS — Z833 Family history of diabetes mellitus: Secondary | ICD-10-CM | POA: Diagnosis not present

## 2020-12-19 DIAGNOSIS — Z8249 Family history of ischemic heart disease and other diseases of the circulatory system: Secondary | ICD-10-CM | POA: Diagnosis not present

## 2020-12-19 DIAGNOSIS — F17210 Nicotine dependence, cigarettes, uncomplicated: Secondary | ICD-10-CM | POA: Diagnosis not present

## 2020-12-19 DIAGNOSIS — L97513 Non-pressure chronic ulcer of other part of right foot with necrosis of muscle: Secondary | ICD-10-CM | POA: Insufficient documentation

## 2020-12-19 DIAGNOSIS — Z885 Allergy status to narcotic agent status: Secondary | ICD-10-CM | POA: Diagnosis not present

## 2020-12-19 DIAGNOSIS — Z880 Allergy status to penicillin: Secondary | ICD-10-CM | POA: Diagnosis not present

## 2020-12-19 DIAGNOSIS — Z8673 Personal history of transient ischemic attack (TIA), and cerebral infarction without residual deficits: Secondary | ICD-10-CM | POA: Insufficient documentation

## 2020-12-19 DIAGNOSIS — Z90710 Acquired absence of both cervix and uterus: Secondary | ICD-10-CM | POA: Insufficient documentation

## 2020-12-19 DIAGNOSIS — E1152 Type 2 diabetes mellitus with diabetic peripheral angiopathy with gangrene: Secondary | ICD-10-CM | POA: Diagnosis not present

## 2020-12-19 DIAGNOSIS — E1142 Type 2 diabetes mellitus with diabetic polyneuropathy: Secondary | ICD-10-CM | POA: Diagnosis not present

## 2020-12-19 DIAGNOSIS — Z7984 Long term (current) use of oral hypoglycemic drugs: Secondary | ICD-10-CM | POA: Diagnosis not present

## 2020-12-19 DIAGNOSIS — E785 Hyperlipidemia, unspecified: Secondary | ICD-10-CM | POA: Insufficient documentation

## 2020-12-19 DIAGNOSIS — E11621 Type 2 diabetes mellitus with foot ulcer: Secondary | ICD-10-CM | POA: Insufficient documentation

## 2020-12-19 DIAGNOSIS — Z95828 Presence of other vascular implants and grafts: Secondary | ICD-10-CM | POA: Diagnosis not present

## 2020-12-19 DIAGNOSIS — Z794 Long term (current) use of insulin: Secondary | ICD-10-CM | POA: Diagnosis not present

## 2020-12-19 DIAGNOSIS — I70261 Atherosclerosis of native arteries of extremities with gangrene, right leg: Secondary | ICD-10-CM | POA: Diagnosis not present

## 2020-12-19 HISTORY — PX: WOUND EXPLORATION: SHX6188

## 2020-12-19 HISTORY — PX: AMPUTATION TOE: SHX6595

## 2020-12-19 LAB — POCT I-STAT, CHEM 8
BUN: 26 mg/dL — ABNORMAL HIGH (ref 6–20)
Calcium, Ion: 1.07 mmol/L — ABNORMAL LOW (ref 1.15–1.40)
Chloride: 107 mmol/L (ref 98–111)
Creatinine, Ser: 1.7 mg/dL — ABNORMAL HIGH (ref 0.44–1.00)
Glucose, Bld: 250 mg/dL — ABNORMAL HIGH (ref 70–99)
HCT: 37 % (ref 36.0–46.0)
Hemoglobin: 12.6 g/dL (ref 12.0–15.0)
Potassium: 4.5 mmol/L (ref 3.5–5.1)
Sodium: 141 mmol/L (ref 135–145)
TCO2: 25 mmol/L (ref 22–32)

## 2020-12-19 LAB — GLUCOSE, CAPILLARY: Glucose-Capillary: 210 mg/dL — ABNORMAL HIGH (ref 70–99)

## 2020-12-19 SURGERY — AMPUTATION, TOE
Anesthesia: General | Site: Toe | Laterality: Right

## 2020-12-19 MED ORDER — SODIUM CHLORIDE 0.9 % IV SOLN: INTRAVENOUS | Status: DC

## 2020-12-19 MED ORDER — EPHEDRINE SULFATE 50 MG/ML IJ SOLN
INTRAMUSCULAR | Status: DC | PRN
  Administered 2020-12-19: 5 mg via INTRAVENOUS

## 2020-12-19 MED ORDER — ONDANSETRON HCL 4 MG/2ML IJ SOLN: 4.0000 mg | Freq: Four times a day (QID) | INTRAMUSCULAR | Status: DC | PRN

## 2020-12-19 MED ORDER — PROPOFOL 10 MG/ML IV BOLUS
INTRAVENOUS | Status: AC
  Filled 2020-12-19: qty 20

## 2020-12-19 MED ORDER — CLINDAMYCIN PHOSPHATE 900 MG/50ML IV SOLN
INTRAVENOUS | Status: AC
  Filled 2020-12-19: qty 50

## 2020-12-19 MED ORDER — FAMOTIDINE 20 MG PO TABS: 20.0000 mg | ORAL_TABLET | Freq: Once | ORAL | Status: AC

## 2020-12-19 MED ORDER — OXYCODONE HCL 5 MG PO TABS
ORAL_TABLET | ORAL | Status: AC
  Filled 2020-12-19: qty 1

## 2020-12-19 MED ORDER — HEPARIN SOD (PORK) LOCK FLUSH 100 UNIT/ML IV SOLN: 250.0000 [IU] | INTRAVENOUS | Status: AC | PRN

## 2020-12-19 MED ORDER — APREPITANT 40 MG PO CAPS: 40.0000 mg | ORAL_CAPSULE | Freq: Once | ORAL | Status: AC

## 2020-12-19 MED ORDER — CLINDAMYCIN PHOSPHATE 900 MG/50ML IV SOLN
900.0000 mg | INTRAVENOUS | Status: AC
  Administered 2020-12-19: 900 mg via INTRAVENOUS

## 2020-12-19 MED ORDER — CHLORHEXIDINE GLUCONATE 0.12 % MT SOLN
OROMUCOSAL | Status: AC
  Administered 2020-12-19: 15 mL via OROMUCOSAL
  Filled 2020-12-19: qty 15

## 2020-12-19 MED ORDER — CHLORHEXIDINE GLUCONATE 0.12 % MT SOLN: 15.0000 mL | Freq: Once | OROMUCOSAL | Status: AC

## 2020-12-19 MED ORDER — ONDANSETRON HCL 4 MG/2ML IJ SOLN
INTRAMUSCULAR | Status: DC | PRN
  Administered 2020-12-19: 4 mg via INTRAVENOUS

## 2020-12-19 MED ORDER — MEPERIDINE HCL 25 MG/ML IJ SOLN: 6.2500 mg | INTRAMUSCULAR | Status: DC | PRN

## 2020-12-19 MED ORDER — METOCLOPRAMIDE HCL 5 MG/ML IJ SOLN: 5.0000 mg | Freq: Three times a day (TID) | INTRAMUSCULAR | Status: DC | PRN

## 2020-12-19 MED ORDER — OXYCODONE HCL 5 MG PO TABS
5.0000 mg | ORAL_TABLET | Freq: Once | ORAL | Status: AC | PRN
  Administered 2020-12-19: 5 mg via ORAL

## 2020-12-19 MED ORDER — DIPHENHYDRAMINE HCL 50 MG/ML IJ SOLN
INTRAMUSCULAR | Status: DC | PRN
  Administered 2020-12-19: 12.5 mg via INTRAVENOUS

## 2020-12-19 MED ORDER — OXYCODONE-ACETAMINOPHEN 5-325 MG PO TABS: 1.0000 | ORAL_TABLET | Freq: Four times a day (QID) | ORAL | 0 refills | Status: DC | PRN

## 2020-12-19 MED ORDER — ONDANSETRON HCL 4 MG PO TABS: 4.0000 mg | ORAL_TABLET | Freq: Four times a day (QID) | ORAL | Status: DC | PRN

## 2020-12-19 MED ORDER — METOCLOPRAMIDE HCL 10 MG PO TABS: 5.0000 mg | ORAL_TABLET | Freq: Three times a day (TID) | ORAL | Status: DC | PRN

## 2020-12-19 MED ORDER — OXYCODONE HCL 5 MG/5ML PO SOLN: 5.0000 mg | Freq: Once | ORAL | Status: AC | PRN

## 2020-12-19 MED ORDER — FENTANYL CITRATE (PF) 100 MCG/2ML IJ SOLN: 25.0000 ug | INTRAMUSCULAR | Status: DC | PRN

## 2020-12-19 MED ORDER — MIDAZOLAM HCL 2 MG/2ML IJ SOLN
INTRAMUSCULAR | Status: DC | PRN
  Administered 2020-12-19: 2 mg via INTRAVENOUS

## 2020-12-19 MED ORDER — FENTANYL CITRATE (PF) 100 MCG/2ML IJ SOLN
INTRAMUSCULAR | Status: AC
  Filled 2020-12-19: qty 2

## 2020-12-19 MED ORDER — ACETAMINOPHEN 10 MG/ML IV SOLN
INTRAVENOUS | Status: DC | PRN
  Administered 2020-12-19: 1000 mg via INTRAVENOUS

## 2020-12-19 MED ORDER — PROMETHAZINE HCL 25 MG/ML IJ SOLN: 6.2500 mg | INTRAMUSCULAR | Status: DC | PRN

## 2020-12-19 MED ORDER — BUPIVACAINE HCL 0.5 % IJ SOLN
INTRAMUSCULAR | Status: DC | PRN
  Administered 2020-12-19: 10 mL

## 2020-12-19 MED ORDER — APREPITANT 40 MG PO CAPS
ORAL_CAPSULE | ORAL | Status: AC
  Administered 2020-12-19: 40 mg via ORAL
  Filled 2020-12-19: qty 1

## 2020-12-19 MED ORDER — LIDOCAINE HCL (PF) 1 % IJ SOLN
INTRAMUSCULAR | Status: DC | PRN
  Administered 2020-12-19: 10 mL

## 2020-12-19 MED ORDER — DIPHENHYDRAMINE HCL 50 MG/ML IJ SOLN
INTRAMUSCULAR | Status: AC
  Filled 2020-12-19: qty 1

## 2020-12-19 MED ORDER — HEPARIN SOD (PORK) LOCK FLUSH 100 UNIT/ML IV SOLN
INTRAVENOUS | Status: AC
  Administered 2020-12-19: 250 [IU]
  Filled 2020-12-19: qty 5

## 2020-12-19 MED ORDER — ORAL CARE MOUTH RINSE: 15.0000 mL | Freq: Once | OROMUCOSAL | Status: AC

## 2020-12-19 MED ORDER — PHENYLEPHRINE HCL (PRESSORS) 10 MG/ML IV SOLN
INTRAVENOUS | Status: DC | PRN
  Administered 2020-12-19: 180 ug via INTRAVENOUS
  Administered 2020-12-19: 150 ug via INTRAVENOUS
  Administered 2020-12-19 (×2): 100 ug via INTRAVENOUS
  Administered 2020-12-19: 200 ug via INTRAVENOUS

## 2020-12-19 MED ORDER — SUCCINYLCHOLINE CHLORIDE 20 MG/ML IJ SOLN
INTRAMUSCULAR | Status: DC | PRN
  Administered 2020-12-19: 100 mg via INTRAVENOUS

## 2020-12-19 MED ORDER — PROPOFOL 10 MG/ML IV BOLUS
INTRAVENOUS | Status: DC | PRN
  Administered 2020-12-19: 50 mg via INTRAVENOUS
  Administered 2020-12-19: 15 mg via INTRAVENOUS

## 2020-12-19 MED ORDER — LIDOCAINE HCL (CARDIAC) PF 100 MG/5ML IV SOSY
PREFILLED_SYRINGE | INTRAVENOUS | Status: DC | PRN
  Administered 2020-12-19: 100 mg via INTRAVENOUS

## 2020-12-19 MED ORDER — FAMOTIDINE 20 MG PO TABS
ORAL_TABLET | ORAL | Status: AC
  Administered 2020-12-19: 20 mg via ORAL
  Filled 2020-12-19: qty 1

## 2020-12-19 MED ORDER — POVIDONE-IODINE 7.5 % EX SOLN
Freq: Once | CUTANEOUS | Status: DC
  Filled 2020-12-19: qty 118

## 2020-12-19 MED ORDER — ACETAMINOPHEN 10 MG/ML IV SOLN
INTRAVENOUS | Status: AC
  Filled 2020-12-19: qty 100

## 2020-12-19 MED ORDER — MIDAZOLAM HCL 2 MG/2ML IJ SOLN
INTRAMUSCULAR | Status: AC
  Filled 2020-12-19: qty 2

## 2020-12-19 SURGICAL SUPPLY — 45 items
BLADE OSC/SAGITTAL MD 5.5X18 (BLADE) ×3 IMPLANT
BLADE SURG MINI STRL (BLADE) ×3 IMPLANT
BNDG CMPR STD VLCR NS LF 5.8X4 (GAUZE/BANDAGES/DRESSINGS) ×2
BNDG CONFORM 2 STRL LF (GAUZE/BANDAGES/DRESSINGS) ×3 IMPLANT
BNDG CONFORM 3 STRL LF (GAUZE/BANDAGES/DRESSINGS) ×6 IMPLANT
BNDG ELASTIC 4X5.8 VLCR NS LF (GAUZE/BANDAGES/DRESSINGS) ×3 IMPLANT
BNDG ESMARK 4X12 TAN STRL LF (GAUZE/BANDAGES/DRESSINGS) ×3 IMPLANT
BNDG GAUZE 4.5X4.1 6PLY STRL (MISCELLANEOUS) ×3 IMPLANT
CANISTER SUCT 1200ML W/VALVE (MISCELLANEOUS) ×3 IMPLANT
COVER WAND RF STERILE (DRAPES) ×3 IMPLANT
CUFF TOURN SGL QUICK 12 (TOURNIQUET CUFF) IMPLANT
CUFF TOURN SGL QUICK 18X4 (TOURNIQUET CUFF) IMPLANT
DRAPE FLUOR MINI C-ARM 54X84 (DRAPES) ×3 IMPLANT
DRAPE XRAY CASSETTE 23X24 (DRAPES) ×3 IMPLANT
DURAPREP 26ML APPLICATOR (WOUND CARE) ×3 IMPLANT
ELECT REM PT RETURN 9FT ADLT (ELECTROSURGICAL) ×3
ELECTRODE REM PT RTRN 9FT ADLT (ELECTROSURGICAL) ×2 IMPLANT
GAUZE PACKING IODOFORM 1/2 (PACKING) ×3 IMPLANT
GAUZE SPONGE 4X4 12PLY STRL (GAUZE/BANDAGES/DRESSINGS) ×3 IMPLANT
GAUZE XEROFORM 1X8 LF (GAUZE/BANDAGES/DRESSINGS) ×3 IMPLANT
GLOVE SURG ENC MOIS LTX SZ7.5 (GLOVE) ×3 IMPLANT
GLOVE SURG UNDER LTX SZ8 (GLOVE) ×3 IMPLANT
GOWN STRL REUS W/ TWL XL LVL3 (GOWN DISPOSABLE) ×4 IMPLANT
GOWN STRL REUS W/TWL XL LVL3 (GOWN DISPOSABLE) ×6
HANDPIECE VERSAJET DEBRIDEMENT (MISCELLANEOUS) ×3 IMPLANT
IV NS IRRIG 3000ML ARTHROMATIC (IV SOLUTION) ×3 IMPLANT
KIT TURNOVER KIT A (KITS) ×3 IMPLANT
LABEL OR SOLS (LABEL) ×3 IMPLANT
MANIFOLD NEPTUNE II (INSTRUMENTS) ×3 IMPLANT
NEEDLE FILTER BLUNT 18X 1/2SAF (NEEDLE) ×1
NEEDLE FILTER BLUNT 18X1 1/2 (NEEDLE) ×2 IMPLANT
NEEDLE HYPO 25X1 1.5 SAFETY (NEEDLE) ×3 IMPLANT
NS IRRIG 500ML POUR BTL (IV SOLUTION) ×3 IMPLANT
PACK EXTREMITY ARMC (MISCELLANEOUS) ×3 IMPLANT
PAD ABD DERMACEA PRESS 5X9 (GAUZE/BANDAGES/DRESSINGS) ×6 IMPLANT
PULSAVAC PLUS IRRIG FAN TIP (DISPOSABLE) ×3
SHIELD FULL FACE ANTIFOG 7M (MISCELLANEOUS) ×3 IMPLANT
STOCKINETTE M/LG 89821 (MISCELLANEOUS) ×3 IMPLANT
STRAP SAFETY 5IN WIDE (MISCELLANEOUS) ×3 IMPLANT
SUT ETHILON 3-0 FS-10 30 BLK (SUTURE) ×6
SUT ETHILON 5-0 FS-2 18 BLK (SUTURE) ×3 IMPLANT
SUT VIC AB 4-0 FS2 27 (SUTURE) ×3 IMPLANT
SUTURE EHLN 3-0 FS-10 30 BLK (SUTURE) ×4 IMPLANT
SYR 10ML LL (SYRINGE) ×9 IMPLANT
TIP FAN IRRIG PULSAVAC PLUS (DISPOSABLE) ×2 IMPLANT

## 2020-12-19 NOTE — Progress Notes (Signed)
Dr. Vickki Muff at bedside, post op wedge shoe ordered. Patient is non weight bearing

## 2020-12-19 NOTE — Discharge Instructions (Signed)
[place]  POST OPERATIVE INSTRUCTIONS FOR DR. Vickki Muff AND DR. [place]   1. Take your medication as prescribed.  Pain medication should be taken only as needed.  2. Keep the dressing clean, dry and intact.  3. Keep your foot elevated above the heart level for the first 48 hours.  4. We have instructed you to be non-weight bearing.  If you need to place weight on your foot use your heel only for transferring.  5. Always wear your post-op shoe when walking.  Always use your crutches if you are to be non-weight bearing.  6. Do not take a shower. Baths are permissible as long as the foot is kept out of the water.   7. Every hour you are awake:  - Bend your knee 15 times. - Flex foot 15 times - Massage calf 15 times  8. Call [facility] [phone]) if any of the following problems occur: - You develop a temperature or fever. - The bandage becomes saturated with blood. - Medication does not stop your pain. - Injury of the foot occurs. - Any symptoms of infection including redness, odor, or red streaks running from wound.   AMBULATORY SURGERY  DISCHARGE INSTRUCTIONS   1) The drugs that you were given will stay in your system until tomorrow so for the next 24 hours you should not:  A) Drive an automobile B) Make any legal decisions C) Drink any alcoholic beverage   2) You may resume regular meals tomorrow.  Today it is better to start with liquids and gradually work up to solid foods.  You may eat anything you prefer, but it is better to start with liquids, then soup and crackers, and gradually work up to solid foods.   3) Please notify your doctor immediately if you have any unusual bleeding, trouble breathing, redness and pain at the surgery site, drainage, fever, or pain not relieved by medication.    4) Additional Instructions:        Please contact your physician with any  problems or Same Day Surgery at [phone], Monday through Friday 6 am to 4 pm, or [place] at [facility] number at [phone].

## 2020-12-19 NOTE — Op Note (Signed)
Operative note   Surgeon:Shadow Stiggers Lawyer: None    Preop diagnosis: 1.  Ulcer distal phalanx right toe 2.  Nonhealing wound lateral fifth metatarsal 3.  Full-thickness ulceration distal fourth metatarsal    Postop diagnosis: Same    Procedure: 1.  Amputation proximal phalanx metatarsophalangeal joint right great toe 2.  Delayed primary closure lateral foot wound 3.  Excisional debridement to muscle distal fourth metatarsophalangeal joint right foot    EBL: Minimal    Anesthesia:local and general    Hemostasis: Ankle tourniquet inflated to 200 mmHg for approximately 45 minutes    Specimen: Great toe proximal phalanx amputation    Complications: None    Operative indications:Donna Fisher is an 55 y.o. that presents today for surgical intervention.  The risks/benefits/alternatives/complications have been discussed and consent has been given.    Procedure:  Patient was brought into the OR and placed on the operating table in thesupine position. After anesthesia was obtained theright lower extremity was prepped and draped in usual sterile fashion.  Attention was directed to the distal aspect of the proximal phalanx stump of the great toe.  2 semielliptical flaps were made encompassing the distal great toe ulceration.  Dissection was carried back to the metatarsophalangeal joint.  The bone was then disarticulated at the joint and sent for pathological examination.  The wound was flushed with copious amounts of irrigation.  Closure was then performed with 4-0 Vicryl and a 3-0 nylon.  Attention was next directed laterally to the distal fourth metatarsal region where a full-thickness necrotic ulceration was noted at this time.  3 debridement the ulceration measured 1.5 x 1.8 cm.  Post debridement the ulceration measured 2.5 x 2.0 with a depth of 0.9 cm.  Excisional debridement was performed with a 15 blade down to the level of muscle and including muscle removed.  Further debridement  was performed with a Versajet thereafter.  Finally attention was directed laterally along a nonhealing wound course along the fifth metatarsal base region.  Initial dissection of all nonviable tissue was performed with a Versajet down to the level of muscle and deeper tissue.  Layered wound closure was then performed with 3-0 Vicryl to all of the deeper layer and subcutaneous tissue.  The skin was then reapproximated with a 3-0 nylon.  The tourniquet was released.  No overt bleeders were noted from the ulcerative site.  Dressings were then applied with Xeroform 4 x 4's conformer Kerlix and an Ace wrap to the right foot.    Patient tolerated the procedure and anesthesia well.  Was transported from the OR to the PACU with all vital signs stable and vascular status intact. To be discharged per routine protocol.  Will follow up in approximately 1 week in the outpatient clinic.

## 2020-12-19 NOTE — H&P (Signed)
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  The various methods of treatment have been discussed with the patient.  No guarantees were given.  After consideration of risks, benefits and other options for treatment, the patient has consented to surgery.  I have reviewed the patients' chart and labs.     A history and physical examination was performed in my office.  The patient was reexamined.  There have been no changes to this history and physical examination.  [name] A

## 2020-12-19 NOTE — Progress Notes (Signed)
PATIENT HAS A PICC LINE, IV FLUIDS ARE INFUSING WITH NO COMPLICATIONS. PICC SITE IS DRY CLEAN INTACT. NO REDNESS SWELLING OR DRAINAGE NOTED.

## 2020-12-19 NOTE — Anesthesia Procedure Notes (Signed)
Procedure Name: LMA Insertion Date/Time: 12/19/2020 7:50 AM Performed by: Allean Found, CRNA Pre-anesthesia Checklist: Patient identified, Patient being monitored, Timeout performed, Emergency Drugs available and Suction available Patient Re-evaluated:Patient Re-evaluated prior to induction Oxygen Delivery Method: Circle system utilized Preoxygenation: Pre-oxygenation with 100% oxygen Induction Type: IV induction Ventilation: Mask ventilation without difficulty LMA: LMA inserted LMA Size: 4.0 Tube type: Oral Number of attempts: 1 Placement Confirmation: positive ETCO2 and breath sounds checked- equal and bilateral Tube secured with: Tape Dental Injury: Teeth and Oropharynx as per pre-operative assessment

## 2020-12-19 NOTE — Anesthesia Postprocedure Evaluation (Signed)
Anesthesia Post Note  Patient: Donna Fisher  Procedure(s) Performed: TOE MPJ (Right Toe) DELAYED PRIMARY CLOSURE RIGHT FOOT (Right )  Patient location during evaluation: PACU Anesthesia Type: General Level of consciousness: awake and alert and oriented Pain management: pain level controlled Vital Signs Assessment: post-procedure vital signs reviewed and stable Respiratory status: spontaneous breathing, nonlabored ventilation and respiratory function stable Cardiovascular status: blood pressure returned to baseline and stable Postop Assessment: no signs of nausea or vomiting Anesthetic complications: no   No complications documented.   Last Vitals:  Vitals:   12/19/20 1010 12/19/20 1047  BP: 103/60 (!) 105/58  Pulse: 89 86  Resp: 14 16  Temp: (!) 36.2 C   SpO2: 97% 96%    Last Pain:  Vitals:   12/19/20 1047  TempSrc:   PainSc: 0-No pain                 Maguire Killmer

## 2020-12-19 NOTE — Anesthesia Preprocedure Evaluation (Signed)
Anesthesia Evaluation  Patient identified by MRN, date of birth, ID band Patient awake    Reviewed: Allergy & Precautions, NPO status , Patient's Chart, lab work & pertinent test results  History of Anesthesia Complications (+) PONV and history of anesthetic complications  Airway Mallampati: III  TM Distance: >3 FB Neck ROM: Full    Dental  (+) Poor Dentition, Missing,    Pulmonary neg sleep apnea, neg COPD, Current Smoker and Patient abstained from smoking.,    breath sounds clear to auscultation- rhonchi (-) wheezing      Cardiovascular Exercise Tolerance: Good (-) hypertension+ Peripheral Vascular Disease  (-) CAD, (-) Past MI, (-) Cardiac Stents and (-) CABG  Rhythm:Regular Rate:Normal - Systolic murmurs and - Diastolic murmurs    Neuro/Psych  Headaches, Anxiety CVA, No Residual Symptoms    GI/Hepatic negative GI ROS, Neg liver ROS,   Endo/Other  diabetes, Insulin Dependent  Renal/GU CRFRenal disease     Musculoskeletal negative musculoskeletal ROS (+)   Abdominal (+) + obese,   Peds  Hematology negative hematology ROS (+)   Anesthesia Other Findings Past Medical History: No date: Anxiety state, unspecified No date: Chronic kidney disease     Comment:  Stage III B  No date: Diabetic neuropathy ([place]) No date: Genital herpes, unspecified No date: Headache(784.0) No date: History of kidney stones 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 No date: Mini stroke ([place])     Comment:  Hx No date: Neuromuscular disorder ([place])     Comment:  neuropathy No date: Other and unspecified hyperlipidemia No date: Pain in joint, pelvic region and thigh No date: Pneumonia No date: PONV (postoperative nausea and vomiting)     Comment:  N/V No date: Stroke [facility])     Comment:  mini stroke 2015 No date: 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 of complication, uncontrolled No date: Urinary tract infection, site not  specified No date: Vaginitis and vulvovaginitis, unspecified No date: Vaginitis and vulvovaginitis, unspecified   Reproductive/Obstetrics                             Anesthesia Physical Anesthesia Plan  ASA: III  Anesthesia Plan: General   Post-op Pain Management:    Induction: Intravenous  PONV Risk Score and Plan: 2 and Ondansetron  Airway Management Planned: LMA  Additional Equipment:   Intra-op Plan:   Post-operative Plan:   Informed Consent: I have reviewed the patients History and Physical, chart, labs and discussed the procedure including the risks, benefits and alternatives for the proposed anesthesia with the patient or authorized representative who has indicated his/her understanding and acceptance.     Dental advisory given  Plan Discussed with: CRNA and Anesthesiologist  Anesthesia Plan Comments:         Anesthesia Quick Evaluation

## 2020-12-19 NOTE — Transfer of Care (Signed)
Immediate Anesthesia Transfer of Care Note  Patient: Donna Fisher  Procedure(s) Performed: TOE MPJ (Right Toe) DELAYED PRIMARY CLOSURE RIGHT FOOT (Right )  Patient Location: PACU  Anesthesia Type:General  Level of Consciousness: sedated  Airway & Oxygen Therapy: Patient Spontanous Breathing and Patient connected to face mask oxygen  Post-op Assessment: Report given to RN and Post -op Vital signs reviewed and stable  Post vital signs: Reviewed and stable  Last Vitals:  Vitals Value Taken Time  BP 122/67 12/19/20 0903  Temp 36.2 C 12/19/20 0903  Pulse 91 12/19/20 0908  Resp 10 12/19/20 0908  SpO2 99 % 12/19/20 0908  Vitals shown include unvalidated device data.  Last Pain:  Vitals:   12/19/20 0631  TempSrc: Oral  PainSc: 0-No pain         Complications: No complications documented.

## 2020-12-23 LAB — SURGICAL PATHOLOGY

## 2021-01-06 ENCOUNTER — Telehealth: Payer: Self-pay

## 2021-01-06 NOTE — Telephone Encounter (Signed)
Porization for Repatha '140mg'$ /mL initiated by [email or web address]. KEYIS:[id number]  Waiting for determination from [facility].   Patient will need repeat lipid panel at next office visit. Last drawn 08/03/2019

## 2021-01-06 NOTE — Telephone Encounter (Signed)
PA Case: [id number] Status: Approved Coverage Starts on: 07/19/2020 12:00:00 AM Coverage Ends on: 07/18/2021 12:00:00 AM Questions? Contact [phone].

## 2021-01-22 DIAGNOSIS — N2581 Secondary hyperparathyroidism of renal origin: Secondary | ICD-10-CM | POA: Insufficient documentation

## 2021-01-22 DIAGNOSIS — D631 Anemia in chronic kidney disease: Secondary | ICD-10-CM | POA: Insufficient documentation

## 2021-01-22 DIAGNOSIS — R809 Proteinuria, unspecified: Secondary | ICD-10-CM | POA: Insufficient documentation

## 2021-02-02 ENCOUNTER — Ambulatory Visit: Payer: Medicare HMO | Admitting: Cardiovascular Disease

## 2021-02-03 ENCOUNTER — Ambulatory Visit: Payer: Medicare HMO | Admitting: Cardiovascular Disease

## 2021-02-10 ENCOUNTER — Other Ambulatory Visit: Payer: Self-pay | Admitting: Podiatry

## 2021-02-10 NOTE — Pre-Procedure Instructions (Addendum)
Telephone message left for patient with preoperative instructions. (NPO midnight, medications etc). Anesthesia orders entered. Instructed to call PAT if needed.

## 2021-02-13 ENCOUNTER — Encounter: Payer: Self-pay | Admitting: Podiatry

## 2021-02-13 ENCOUNTER — Encounter
Admission: RE | Disposition: A | Discharge status: Home / Self Care | Payer: Self-pay | Source: Home / Self Care | Attending: Podiatry

## 2021-02-13 ENCOUNTER — Ambulatory Visit: Payer: Medicare HMO

## 2021-02-13 ENCOUNTER — Ambulatory Visit
Admission: RE | Admit: 2021-02-13 | Discharge: 2021-02-13 | Disposition: A | Discharge status: Home / Self Care | Payer: Medicare HMO | Attending: Podiatry | Admitting: Podiatry

## 2021-02-13 ENCOUNTER — Ambulatory Visit: Payer: Medicare HMO | Admitting: Certified Registered Nurse Anesthetist

## 2021-02-13 ENCOUNTER — Other Ambulatory Visit: Payer: Self-pay

## 2021-02-13 DIAGNOSIS — Z801 Family history of malignant neoplasm of trachea, bronchus and lung: Secondary | ICD-10-CM | POA: Diagnosis not present

## 2021-02-13 DIAGNOSIS — L97513 Non-pressure chronic ulcer of other part of right foot with necrosis of muscle: Secondary | ICD-10-CM | POA: Insufficient documentation

## 2021-02-13 DIAGNOSIS — Z888 Allergy status to other drugs, medicaments and biological substances status: Secondary | ICD-10-CM | POA: Diagnosis not present

## 2021-02-13 DIAGNOSIS — E1169 Type 2 diabetes mellitus with other specified complication: Secondary | ICD-10-CM | POA: Diagnosis present

## 2021-02-13 DIAGNOSIS — Z833 Family history of diabetes mellitus: Secondary | ICD-10-CM | POA: Insufficient documentation

## 2021-02-13 DIAGNOSIS — Z8379 Family history of other diseases of the digestive system: Secondary | ICD-10-CM | POA: Insufficient documentation

## 2021-02-13 DIAGNOSIS — Z800 Family history of malignant neoplasm of digestive organs: Secondary | ICD-10-CM | POA: Diagnosis not present

## 2021-02-13 DIAGNOSIS — E1143 Type 2 diabetes mellitus with diabetic autonomic (poly)neuropathy: Secondary | ICD-10-CM | POA: Insufficient documentation

## 2021-02-13 DIAGNOSIS — M86171 Other acute osteomyelitis, right ankle and foot: Secondary | ICD-10-CM | POA: Insufficient documentation

## 2021-02-13 DIAGNOSIS — Z79899 Other long term (current) drug therapy: Secondary | ICD-10-CM | POA: Diagnosis not present

## 2021-02-13 DIAGNOSIS — Z7901 Long term (current) use of anticoagulants: Secondary | ICD-10-CM | POA: Insufficient documentation

## 2021-02-13 DIAGNOSIS — Z885 Allergy status to narcotic agent status: Secondary | ICD-10-CM | POA: Diagnosis not present

## 2021-02-13 DIAGNOSIS — Z794 Long term (current) use of insulin: Secondary | ICD-10-CM | POA: Insufficient documentation

## 2021-02-13 DIAGNOSIS — E1152 Type 2 diabetes mellitus with diabetic peripheral angiopathy with gangrene: Secondary | ICD-10-CM | POA: Insufficient documentation

## 2021-02-13 DIAGNOSIS — E1142 Type 2 diabetes mellitus with diabetic polyneuropathy: Secondary | ICD-10-CM | POA: Diagnosis not present

## 2021-02-13 DIAGNOSIS — F17210 Nicotine dependence, cigarettes, uncomplicated: Secondary | ICD-10-CM | POA: Diagnosis not present

## 2021-02-13 DIAGNOSIS — I96 Gangrene, not elsewhere classified: Secondary | ICD-10-CM | POA: Diagnosis not present

## 2021-02-13 DIAGNOSIS — K3184 Gastroparesis: Secondary | ICD-10-CM | POA: Diagnosis not present

## 2021-02-13 DIAGNOSIS — Z7984 Long term (current) use of oral hypoglycemic drugs: Secondary | ICD-10-CM | POA: Insufficient documentation

## 2021-02-13 DIAGNOSIS — Z7982 Long term (current) use of aspirin: Secondary | ICD-10-CM | POA: Diagnosis not present

## 2021-02-13 DIAGNOSIS — Z880 Allergy status to penicillin: Secondary | ICD-10-CM | POA: Diagnosis not present

## 2021-02-13 HISTORY — PX: AMPUTATION TOE: SHX6595

## 2021-02-13 HISTORY — PX: BONE EXCISION: SHX6730

## 2021-02-13 LAB — GLUCOSE, CAPILLARY
Glucose-Capillary: 141 mg/dL — ABNORMAL HIGH (ref 70–99)
Glucose-Capillary: 152 mg/dL — ABNORMAL HIGH (ref 70–99)

## 2021-02-13 SURGERY — AMPUTATION, TOE
Anesthesia: General | Site: Toe | Laterality: Right

## 2021-02-13 MED ORDER — CHLORHEXIDINE GLUCONATE 0.12 % MT SOLN: 15.0000 mL | Freq: Once | OROMUCOSAL | Status: AC

## 2021-02-13 MED ORDER — LIDOCAINE HCL (PF) 1 % IJ SOLN
INTRAMUSCULAR | Status: DC | PRN
  Administered 2021-02-13: 10 mL

## 2021-02-13 MED ORDER — PHENYLEPHRINE HCL (PRESSORS) 10 MG/ML IV SOLN
INTRAVENOUS | Status: DC | PRN
  Administered 2021-02-13 (×3): 100 ug via INTRAVENOUS
  Administered 2021-02-13: 200 ug via INTRAVENOUS
  Administered 2021-02-13: 100 ug via INTRAVENOUS
  Administered 2021-02-13: 200 ug via INTRAVENOUS

## 2021-02-13 MED ORDER — METOCLOPRAMIDE HCL 10 MG PO TABS: 5.0000 mg | ORAL_TABLET | Freq: Three times a day (TID) | ORAL | Status: DC | PRN

## 2021-02-13 MED ORDER — MIDAZOLAM HCL 2 MG/2ML IJ SOLN
INTRAMUSCULAR | Status: AC
  Filled 2021-02-13: qty 2

## 2021-02-13 MED ORDER — FENTANYL CITRATE (PF) 100 MCG/2ML IJ SOLN
INTRAMUSCULAR | Status: DC | PRN
  Administered 2021-02-13: 50 ug via INTRAVENOUS

## 2021-02-13 MED ORDER — HEMOSTATIC AGENTS (NO CHARGE) OPTIME
TOPICAL | Status: DC | PRN
  Administered 2021-02-13: 1 via TOPICAL

## 2021-02-13 MED ORDER — PROPOFOL 10 MG/ML IV BOLUS
INTRAVENOUS | Status: DC | PRN
  Administered 2021-02-13: 200 mg via INTRAVENOUS

## 2021-02-13 MED ORDER — DEXAMETHASONE SODIUM PHOSPHATE 10 MG/ML IJ SOLN
INTRAMUSCULAR | Status: DC | PRN
  Administered 2021-02-13: 10 mg via INTRAVENOUS

## 2021-02-13 MED ORDER — PROMETHAZINE HCL 25 MG/ML IJ SOLN: 6.2500 mg | INTRAMUSCULAR | Status: DC | PRN

## 2021-02-13 MED ORDER — FENTANYL CITRATE (PF) 100 MCG/2ML IJ SOLN: 25.0000 ug | INTRAMUSCULAR | Status: DC | PRN

## 2021-02-13 MED ORDER — LACTATED RINGERS IV SOLN: INTRAVENOUS | Status: DC | PRN

## 2021-02-13 MED ORDER — LIDOCAINE HCL (CARDIAC) PF 100 MG/5ML IV SOSY
PREFILLED_SYRINGE | INTRAVENOUS | Status: DC | PRN
  Administered 2021-02-13: 100 mg via INTRAVENOUS

## 2021-02-13 MED ORDER — ONDANSETRON HCL 4 MG/2ML IJ SOLN
INTRAMUSCULAR | Status: DC | PRN
  Administered 2021-02-13: 4 mg via INTRAVENOUS

## 2021-02-13 MED ORDER — ONDANSETRON HCL 4 MG PO TABS: 4.0000 mg | ORAL_TABLET | Freq: Every day | ORAL | 1 refills | Status: AC | PRN

## 2021-02-13 MED ORDER — CHLORHEXIDINE GLUCONATE 0.12 % MT SOLN
OROMUCOSAL | Status: AC
  Administered 2021-02-13: 15 mL via OROMUCOSAL
  Filled 2021-02-13: qty 15

## 2021-02-13 MED ORDER — BUPIVACAINE HCL 0.5 % IJ SOLN
INTRAMUSCULAR | Status: DC | PRN
  Administered 2021-02-13: 10 mL

## 2021-02-13 MED ORDER — METOCLOPRAMIDE HCL 5 MG/ML IJ SOLN: 5.0000 mg | Freq: Three times a day (TID) | INTRAMUSCULAR | Status: DC | PRN

## 2021-02-13 MED ORDER — OXYCODONE-ACETAMINOPHEN 5-325 MG PO TABS: 1.0000 | ORAL_TABLET | Freq: Four times a day (QID) | ORAL | 0 refills | Status: DC | PRN

## 2021-02-13 MED ORDER — PHENYLEPHRINE HCL (PRESSORS) 10 MG/ML IV SOLN
INTRAVENOUS | Status: AC
  Filled 2021-02-13: qty 1

## 2021-02-13 MED ORDER — SODIUM CHLORIDE 0.9 % IV SOLN: INTRAVENOUS | Status: DC

## 2021-02-13 MED ORDER — ONDANSETRON HCL 4 MG/2ML IJ SOLN: 4.0000 mg | Freq: Four times a day (QID) | INTRAMUSCULAR | Status: DC | PRN

## 2021-02-13 MED ORDER — THROMBIN 5000 UNITS EX SOLR
CUTANEOUS | Status: DC | PRN
  Administered 2021-02-13: 5000 [IU] via TOPICAL

## 2021-02-13 MED ORDER — ONDANSETRON HCL 4 MG PO TABS: 4.0000 mg | ORAL_TABLET | Freq: Four times a day (QID) | ORAL | Status: DC | PRN

## 2021-02-13 MED ORDER — BUPIVACAINE HCL (PF) 0.5 % IJ SOLN
INTRAMUSCULAR | Status: AC
  Filled 2021-02-13: qty 30

## 2021-02-13 MED ORDER — ACETAMINOPHEN 10 MG/ML IV SOLN
INTRAVENOUS | Status: AC
  Filled 2021-02-13: qty 100

## 2021-02-13 MED ORDER — CLINDAMYCIN PHOSPHATE 900 MG/50ML IV SOLN
INTRAVENOUS | Status: AC
  Filled 2021-02-13: qty 50

## 2021-02-13 MED ORDER — CLINDAMYCIN PHOSPHATE 900 MG/50ML IV SOLN
900.0000 mg | INTRAVENOUS | Status: AC
  Administered 2021-02-13: 900 mg via INTRAVENOUS

## 2021-02-13 MED ORDER — MIDAZOLAM HCL 2 MG/2ML IJ SOLN
INTRAMUSCULAR | Status: DC | PRN
  Administered 2021-02-13: 2 mg via INTRAVENOUS

## 2021-02-13 MED ORDER — ACETAMINOPHEN 10 MG/ML IV SOLN
INTRAVENOUS | Status: DC | PRN
  Administered 2021-02-13: 1000 mg via INTRAVENOUS

## 2021-02-13 MED ORDER — POVIDONE-IODINE 7.5 % EX SOLN
Freq: Once | CUTANEOUS | Status: DC
  Filled 2021-02-13: qty 118

## 2021-02-13 MED ORDER — ORAL CARE MOUTH RINSE: 15.0000 mL | Freq: Once | OROMUCOSAL | Status: AC

## 2021-02-13 MED ORDER — THROMBIN 5000 UNITS EX SOLR
CUTANEOUS | Status: AC
  Filled 2021-02-13: qty 5000

## 2021-02-13 MED ORDER — 0.9 % SODIUM CHLORIDE (POUR BTL) OPTIME
TOPICAL | Status: DC | PRN
  Administered 2021-02-13: 200 mL
  Administered 2021-02-13: 800 mL

## 2021-02-13 MED ORDER — FENTANYL CITRATE (PF) 100 MCG/2ML IJ SOLN
INTRAMUSCULAR | Status: AC
  Filled 2021-02-13: qty 2

## 2021-02-13 SURGICAL SUPPLY — 55 items
AGENT HMST MTR 8 SURGIFLO (HEMOSTASIS) ×2
BLADE OSC/SAGITTAL 5.5X25 (BLADE) ×1 IMPLANT
BLADE OSC/SAGITTAL MD 5.5X18 (BLADE) ×3 IMPLANT
BLADE SURG MINI STRL (BLADE) ×3 IMPLANT
BNDG CMPR STD VLCR NS LF 5.8X4 (GAUZE/BANDAGES/DRESSINGS) ×2
BNDG COHESIVE 4X5 TAN STRL (GAUZE/BANDAGES/DRESSINGS) ×1 IMPLANT
BNDG CONFORM 2 STRL LF (GAUZE/BANDAGES/DRESSINGS) ×3 IMPLANT
BNDG CONFORM 3 STRL LF (GAUZE/BANDAGES/DRESSINGS) ×6 IMPLANT
BNDG ELASTIC 4X5.8 VLCR NS LF (GAUZE/BANDAGES/DRESSINGS) ×3 IMPLANT
BNDG ESMARK 4X12 TAN STRL LF (GAUZE/BANDAGES/DRESSINGS) ×3 IMPLANT
BNDG GAUZE ELAST 4 BULKY (GAUZE/BANDAGES/DRESSINGS) ×3 IMPLANT
CANISTER SUCT 1200ML W/VALVE (MISCELLANEOUS) ×2 IMPLANT
CUFF TOURN SGL QUICK 12 (TOURNIQUET CUFF) IMPLANT
CUFF TOURN SGL QUICK 18 (TOURNIQUET CUFF) ×1 IMPLANT
CUFF TOURN SGL QUICK 18X4 (TOURNIQUET CUFF) IMPLANT
DRAPE FLUOR MINI C-ARM 54X84 (DRAPES) ×3 IMPLANT
DRAPE XRAY CASSETTE 23X24 (DRAPES) ×3 IMPLANT
DURAPREP 26ML APPLICATOR (WOUND CARE) ×3 IMPLANT
ELECT CAUTERY BLADE 6.4 (BLADE) ×1 IMPLANT
ELECT REM PT RETURN 9FT ADLT (ELECTROSURGICAL) ×3
ELECTRODE REM PT RTRN 9FT ADLT (ELECTROSURGICAL) ×2 IMPLANT
GAUZE 4X4 16PLY ~~LOC~~+RFID DBL (SPONGE) ×4 IMPLANT
GAUZE PACKING IODOFORM 1/2 (PACKING) ×3 IMPLANT
GAUZE SPONGE 4X4 12PLY STRL (GAUZE/BANDAGES/DRESSINGS) ×3 IMPLANT
GAUZE XEROFORM 1X8 LF (GAUZE/BANDAGES/DRESSINGS) ×3 IMPLANT
GLOVE SURG ENC MOIS LTX SZ7.5 (GLOVE) ×3 IMPLANT
GLOVE SURG UNDER LTX SZ8 (GLOVE) ×3 IMPLANT
GOWN STRL REUS W/ TWL XL LVL3 (GOWN DISPOSABLE) ×4 IMPLANT
GOWN STRL REUS W/TWL XL LVL3 (GOWN DISPOSABLE) ×6
HANDPIECE VERSAJET DEBRIDEMENT (MISCELLANEOUS) ×1 IMPLANT
IV NS IRRIG 3000ML ARTHROMATIC (IV SOLUTION) ×3 IMPLANT
KIT TURNOVER KIT A (KITS) ×3 IMPLANT
LABEL OR SOLS (LABEL) ×3 IMPLANT
MANIFOLD NEPTUNE II (INSTRUMENTS) ×3 IMPLANT
NDL FILTER BLUNT 18X1 1/2 (NEEDLE) ×2 IMPLANT
NDL HYPO 25X1 1.5 SAFETY (NEEDLE) ×2 IMPLANT
NEEDLE FILTER BLUNT 18X 1/2SAF (NEEDLE) ×1
NEEDLE FILTER BLUNT 18X1 1/2 (NEEDLE) ×2 IMPLANT
NEEDLE HYPO 25X1 1.5 SAFETY (NEEDLE) ×3 IMPLANT
NS IRRIG 500ML POUR BTL (IV SOLUTION) ×3 IMPLANT
PACK EXTREMITY ARMC (MISCELLANEOUS) ×3 IMPLANT
PAD ABD DERMACEA PRESS 5X9 (GAUZE/BANDAGES/DRESSINGS) ×6 IMPLANT
PULSAVAC PLUS IRRIG FAN TIP (DISPOSABLE) ×3
SHIELD FULL FACE ANTIFOG 7M (MISCELLANEOUS) ×3 IMPLANT
SPOGE SURGIFLO 8M (HEMOSTASIS) ×1
SPONGE SURGIFLO 8M (HEMOSTASIS) IMPLANT
STOCKINETTE M/LG 89821 (MISCELLANEOUS) ×3 IMPLANT
STRAP SAFETY 5IN WIDE (MISCELLANEOUS) ×3 IMPLANT
SUT ETHILON 3-0 FS-10 30 BLK (SUTURE) ×6
SUT ETHILON 5-0 FS-2 18 BLK (SUTURE) ×3 IMPLANT
SUT VIC AB 4-0 FS2 27 (SUTURE) ×3 IMPLANT
SUTURE EHLN 3-0 FS-10 30 BLK (SUTURE) ×2 IMPLANT
SWAB CULTURE ESWAB REG 1ML (MISCELLANEOUS) ×1 IMPLANT
SYR 10ML LL (SYRINGE) ×9 IMPLANT
TIP FAN IRRIG PULSAVAC PLUS (DISPOSABLE) ×2 IMPLANT

## 2021-02-13 NOTE — Transfer of Care (Signed)
Immediate Anesthesia Transfer of Care Note  Patient: Donna Fisher  Procedure(s) Performed: AMPUTATION TOE- RAY RT 4TH; TOE MPJ RT 3RD (Right: Toe) BONE EXCISION- RT 5TH METATARSAL (Right)  Patient Location: PACU  Anesthesia Type:General  Level of Consciousness: awake, alert  and oriented  Airway & Oxygen Therapy: Patient Spontanous Breathing and Patient connected to face mask oxygen  Post-op Assessment: Report given to RN and Post -op Vital signs reviewed and stable  Post vital signs: Reviewed and stable  Last Vitals:  Vitals Value Taken Time  BP 100/64 02/13/21 1347  Temp    Pulse 82 02/13/21 1350  Resp 11 02/13/21 1350  SpO2 100 % 02/13/21 1350  Vitals shown include unvalidated device data.  Last Pain:  Vitals:   02/13/21 1137  TempSrc: Tympanic  PainSc: 0-No pain      Patients Stated Pain Goal: 0 ([phone] [id number])  Complications: No notable events documented.

## 2021-02-13 NOTE — Anesthesia Preprocedure Evaluation (Signed)
Anesthesia Evaluation  Patient identified by MRN, date of birth, ID band Patient awake    Reviewed: Allergy & Precautions, NPO status , Patient's Chart, lab work & pertinent test results  History of Anesthesia Complications (+) PONV and history of anesthetic complications  Airway Mallampati: III  TM Distance: >3 FB Neck ROM: Full    Dental  (+) Poor Dentition, Missing, Dental Advidsory Given,    Pulmonary neg shortness of breath, neg sleep apnea, neg COPD, neg recent URI, Current Smoker and Patient abstained from smoking.,    breath sounds clear to auscultation- rhonchi (-) wheezing      Cardiovascular Exercise Tolerance: Good (-) hypertension(-) angina+ Peripheral Vascular Disease  (-) CAD, (-) Past MI, (-) Cardiac Stents and (-) CABG  Rhythm:Regular Rate:Normal - Systolic murmurs and - Diastolic murmurs    Neuro/Psych  Headaches, neg Seizures Anxiety  Neuromuscular disease CVA, No Residual Symptoms    GI/Hepatic negative GI ROS, Neg liver ROS,   Endo/Other  diabetes, Insulin Dependent  Renal/GU CRFRenal disease     Musculoskeletal negative musculoskeletal ROS (+)   Abdominal (+) + obese,   Peds  Hematology negative hematology ROS (+)   Anesthesia Other Findings Past Medical History: No date: Anxiety state, unspecified No date: Chronic kidney disease     Comment:  Stage III B  No date: Diabetic neuropathy ([place]) No date: Genital herpes, unspecified No date: Headache(784.0) No date: History of kidney stones 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 No date: Mini stroke ([place])     Comment:  Hx No date: Neuromuscular disorder ([place])     Comment:  neuropathy No date: Other and unspecified hyperlipidemia No date: Pain in joint, pelvic region and thigh No date: Pneumonia No date: PONV (postoperative nausea and vomiting)     Comment:  N/V No date: Stroke [facility])     Comment:  mini stroke 2015 No date: Type II or unspecified type  diabetes mellitus without  mention of complication, uncontrolled No date: Urinary tract infection, site not specified No date: Vaginitis and vulvovaginitis, unspecified No date: Vaginitis and vulvovaginitis, unspecified   Reproductive/Obstetrics                             Anesthesia Physical  Anesthesia Plan  ASA: 3  Anesthesia Plan: General   Post-op Pain Management:    Induction: Intravenous  PONV Risk Score and Plan: 2 and Ondansetron, Dexamethasone, Midazolam and Treatment may vary due to age or medical condition  Airway Management Planned: LMA  Additional Equipment:   Intra-op Plan:   Post-operative Plan: Extubation in OR  Informed Consent: I have reviewed the patients History and Physical, chart, labs and discussed the procedure including the risks, benefits and alternatives for the proposed anesthesia with the patient or authorized representative who has indicated his/her understanding and acceptance.     Dental advisory given  Plan Discussed with: CRNA and Anesthesiologist  Anesthesia Plan Comments:         Anesthesia Quick Evaluation

## 2021-02-13 NOTE — Discharge Instructions (Addendum)

## 2021-02-13 NOTE — Anesthesia Procedure Notes (Signed)
Procedure Name: LMA Insertion Date/Time: 02/13/2021 12:40 PM Performed by: Willette Alma, CRNA Pre-anesthesia Checklist: Patient identified, Patient being monitored, Timeout performed, Emergency Drugs available and Suction available Patient Re-evaluated:Patient Re-evaluated prior to induction Oxygen Delivery Method: Circle system utilized Preoxygenation: Pre-oxygenation with 100% oxygen Induction Type: IV induction Ventilation: Mask ventilation without difficulty LMA: LMA inserted LMA Size: 4.5 Tube type: Oral Number of attempts: 1 Placement Confirmation: positive ETCO2 and breath sounds checked- equal and bilateral Tube secured with: Tape Dental Injury: Teeth and Oropharynx as per pre-operative assessment

## 2021-02-13 NOTE — Op Note (Signed)
Operative note   Surgeon:Joellyn Grandt Lawyer: None    Preop diagnosis: Osteomyelitis right fifth metatarsal and gangrenous changes right fourth metatarsophalangeal joint    Postop diagnosis: Same    Procedure: 1.  Amputation right third toe MTPJ 2.  Amputation right fourth ray 3.  Excision bone fifth metatarsal all right foot    EBL: Minimal    Anesthesia:local and general    Hemostasis: Mid calf tourniquet inflated to 200 mmHg for approximately 17 minutes    Specimen: Specimen from third and fourth metatarsal ulceration for pathology and wound culture was performed.  Specimen from fifth metatarsal osteomyelitis was sent for pathology    Complications: None    Operative indications:Donna Fisher is an 55 y.o. that presents today for surgical intervention.  The risks/benefits/alternatives/complications have been discussed and consent has been given.    Procedure:  Patient was brought into the OR and placed on the operating table in thesupine position. After anesthesia was obtained theright lower extremity was prepped and draped in usual sterile fashion.  Attention was directed to the right foot where a semielliptical incision was placed circumferential around the third and fourth metatarsophalangeal joint.  Decision was continued laterally along the fourth metatarsal head excising a large necrotic ulceration.  Full-thickness dissection was carried down to the bone.  The third toe was disarticulated at the metatarsophalangeal joint.  The fourth metatarsal was incised along the midshaft and the fourth ray was then removed from the surgical field.  A second portion of bone from the fourth metatarsal was excised and marked as the proximal margin.  This wound was flushed with copious amounts of irrigation.  Further removal of nonviable tissue was performed with a Versajet.  Attention was directed along the fifth metatarsal where the incision to the fourth metatarsal was then carried  more proximal to the fifth metatarsal base where an open ulceration was noted and infected bone was found.  Residual metatarsal was then removed with a power saw and sent for pathological examination.  This was marked at the proximal margin.  All areas were flushed with copious amounts of irrigation.  Bleeders were Bovie cauterized.  Closure was then performed with a 4-0 Vicryl for the subcutaneous tissue and a 3-0 nylon for the skin.    Patient tolerated the procedure and anesthesia well.  Was transported from the OR to the PACU with all vital signs stable and vascular status intact. To be discharged per routine protocol.  Will follow up in approximately 1 week in the outpatient clinic.

## 2021-02-13 NOTE — H&P (Signed)
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  The various methods of treatment have been discussed with the patient.  No guarantees were given.  After consideration of risks, benefits and other options for treatment, the patient has consented to surgery.  I have reviewed the patients' chart and labs.     A history and physical examination was performed in my office.  The patient was reexamined.  There have been no changes to this history and physical examination.  Donna Fisher A

## 2021-02-14 NOTE — Anesthesia Postprocedure Evaluation (Signed)
Anesthesia Post Note  Patient: Donna Fisher  Procedure(s) Performed: AMPUTATION TOE- RAY RT 4TH; TOE MPJ RT 3RD (Right: Toe) BONE EXCISION- RT 5TH METATARSAL (Right)  Patient location during evaluation: PACU Anesthesia Type: General Level of consciousness: awake and alert Pain management: pain level controlled Vital Signs Assessment: post-procedure vital signs reviewed and stable Respiratory status: spontaneous breathing, nonlabored ventilation, respiratory function stable and patient connected to nasal cannula oxygen Cardiovascular status: blood pressure returned to baseline and stable Postop Assessment: no apparent nausea or vomiting Anesthetic complications: no   No notable events documented.   Last Vitals:  Vitals:   02/13/21 1501 02/13/21 1530  BP: (!) 97/56 (!) 106/57  Pulse: 82 79  Resp: 18 16  Temp: (!) 36.4 C   SpO2: 95% 96%    Last Pain:  Vitals:   02/13/21 1530  TempSrc:   PainSc: 0-No pain                 Martha Clan

## 2021-02-15 ENCOUNTER — Encounter: Payer: Self-pay | Admitting: Podiatry

## 2021-02-17 LAB — AEROBIC/ANAEROBIC CULTURE W GRAM STAIN (SURGICAL/DEEP WOUND)

## 2021-02-18 LAB — SURGICAL PATHOLOGY

## 2021-03-19 ENCOUNTER — Encounter (INDEPENDENT_AMBULATORY_CARE_PROVIDER_SITE_OTHER): Payer: Medicare Other

## 2021-03-19 ENCOUNTER — Ambulatory Visit (INDEPENDENT_AMBULATORY_CARE_PROVIDER_SITE_OTHER): Payer: Medicare Other | Admitting: Vascular Surgery

## 2021-03-19 ENCOUNTER — Other Ambulatory Visit (HOSPITAL_COMMUNITY): Payer: Self-pay | Admitting: Podiatry

## 2021-03-19 ENCOUNTER — Other Ambulatory Visit: Payer: Self-pay | Admitting: Podiatry

## 2021-03-19 DIAGNOSIS — M7989 Other specified soft tissue disorders: Secondary | ICD-10-CM

## 2021-03-19 DIAGNOSIS — M79604 Pain in right leg: Secondary | ICD-10-CM

## 2021-03-20 ENCOUNTER — Other Ambulatory Visit: Payer: Self-pay

## 2021-03-20 ENCOUNTER — Ambulatory Visit
Admission: RE | Admit: 2021-03-20 | Discharge: 2021-03-20 | Disposition: A | Discharge status: Home / Self Care | Payer: Medicare HMO | Source: Ambulatory Visit | Attending: Podiatry | Admitting: Podiatry

## 2021-03-20 DIAGNOSIS — M7989 Other specified soft tissue disorders: Secondary | ICD-10-CM | POA: Diagnosis present

## 2021-03-20 DIAGNOSIS — M79604 Pain in right leg: Secondary | ICD-10-CM | POA: Diagnosis not present

## 2021-04-03 ENCOUNTER — Other Ambulatory Visit (INDEPENDENT_AMBULATORY_CARE_PROVIDER_SITE_OTHER): Payer: Self-pay | Admitting: Vascular Surgery

## 2021-04-03 DIAGNOSIS — I739 Peripheral vascular disease, unspecified: Secondary | ICD-10-CM

## 2021-04-03 DIAGNOSIS — Z9889 Other specified postprocedural states: Secondary | ICD-10-CM

## 2021-04-06 ENCOUNTER — Ambulatory Visit (INDEPENDENT_AMBULATORY_CARE_PROVIDER_SITE_OTHER): Payer: Medicare HMO

## 2021-04-06 ENCOUNTER — Encounter (INDEPENDENT_AMBULATORY_CARE_PROVIDER_SITE_OTHER): Payer: Self-pay | Admitting: Vascular Surgery

## 2021-04-06 ENCOUNTER — Ambulatory Visit (INDEPENDENT_AMBULATORY_CARE_PROVIDER_SITE_OTHER): Payer: Medicare HMO | Admitting: Vascular Surgery

## 2021-04-06 ENCOUNTER — Other Ambulatory Visit: Payer: Self-pay

## 2021-04-06 VITALS — BP 92/55 | HR 105 | Ht 64.0 in | Wt 192.0 lb

## 2021-04-06 DIAGNOSIS — Z9889 Other specified postprocedural states: Secondary | ICD-10-CM

## 2021-04-06 DIAGNOSIS — I739 Peripheral vascular disease, unspecified: Secondary | ICD-10-CM

## 2021-04-06 DIAGNOSIS — I70211 Atherosclerosis of native arteries of extremities with intermittent claudication, right leg: Secondary | ICD-10-CM

## 2021-04-06 DIAGNOSIS — E118 Type 2 diabetes mellitus with unspecified complications: Secondary | ICD-10-CM | POA: Diagnosis not present

## 2021-04-08 ENCOUNTER — Encounter (INDEPENDENT_AMBULATORY_CARE_PROVIDER_SITE_OTHER): Payer: Self-pay | Admitting: Vascular Surgery
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
## 2021-04-24 ENCOUNTER — Encounter: Payer: Medicare Other | Admitting: Certified Nurse Midwife

## 2021-05-25 ENCOUNTER — Other Ambulatory Visit: Payer: Self-pay

## 2021-05-25 ENCOUNTER — Ambulatory Visit (INDEPENDENT_AMBULATORY_CARE_PROVIDER_SITE_OTHER): Payer: Medicare HMO | Admitting: Obstetrics and Gynecology

## 2021-05-25 ENCOUNTER — Encounter: Payer: Self-pay | Admitting: Obstetrics and Gynecology

## 2021-05-25 VITALS — BP 115/60 | HR 100 | Ht 64.0 in | Wt 197.3 lb

## 2021-05-25 DIAGNOSIS — Z720 Tobacco use: Secondary | ICD-10-CM

## 2021-05-25 DIAGNOSIS — E1122 Type 2 diabetes mellitus with diabetic chronic kidney disease: Secondary | ICD-10-CM | POA: Diagnosis not present

## 2021-05-25 DIAGNOSIS — N8110 Cystocele, unspecified: Secondary | ICD-10-CM

## 2021-05-25 DIAGNOSIS — N3946 Mixed incontinence: Secondary | ICD-10-CM | POA: Diagnosis not present

## 2021-05-25 DIAGNOSIS — N816 Rectocele: Secondary | ICD-10-CM

## 2021-05-25 DIAGNOSIS — E669 Obesity, unspecified: Secondary | ICD-10-CM

## 2021-05-25 DIAGNOSIS — N183 Chronic kidney disease, stage 3 unspecified: Secondary | ICD-10-CM

## 2021-05-25 MED ORDER — TOLTERODINE TARTRATE ER 4 MG PO CP24: 4.0000 mg | ORAL_CAPSULE | Freq: Every day | ORAL | 3 refills | Status: DC
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
## 2021-05-25 NOTE — Patient Instructions (Addendum)
How to Use a Vaginal Pessary A vaginal pessary is a removable device that is placed into your vagina to support pelvic organs that droop. These organs include your uterus, bladder, and rectum. When your pelvic organs drop down into your vagina, it causes a condition called pelvic organ prolapse (POP). A pessary may be an alternative to surgery for women with POP. It may help women who leak urine when they strain or exercise (stress incontinence). This is a symptom of POP. A vaginal pessary may also be a temporary treatment for stress incontinence during pregnancy. There are several types of pessaries. All types are usually made of silicone. You can insert and remove some on your own. Other types must be inserted and removed by your health care provider at office visits. The reason you are using a pessary and the severity of your condition will determine which one is best for you. It is also important to find the right size. A pessary that is too small may fall out. A pessary that is too large may cause pain or discomfort. Your health care provider will do a physical exam to find the correct size and fit for your pessary. It may take several appointments to find the best fit for you. If you can be fit with the type of pessary that you can insert, remove, and clean yourself, your health care provider will teach you how to use your pessary at home. You may have checkups every few months. If you have the type of pessary that needs to be inserted and removed by your health care provider, you will have appointments every few months to have the pessary removed, cleaned, and replaced. What are the risks? When properly fitted and cared for, risks of using a vaginal pessary can be small. However, there can be problems that may include: Vaginal discharge. Vaginal bleeding. A bad smell coming from your vagina. Scraping of the skin inside your vagina. How to use your pessary Follow your health care provider's  instructions for using a pessary. These instructions may vary, depending on the type of pessary you have. To insert a pessary: Wash your hands with soap and water for at least 20 seconds. Squeeze or fold the pessary in half and lubricate the tip with a water-based lubricant. Insert the pessary into your vagina. It will unfold and provide support. To remove the pessary, gently tug it out of your vagina. You can remove the pessary every night or after several days. You can also remove it to have sex. How to care for your pessary If you have a pessary that you can remove: Clean your pessary with soap and water. Rinse well. Dry it completely before inserting it back into your vagina. Follow these instructions at home: Take over-the-counter and prescription medicines only as told by your health care provider. Your health care provider may prescribe an estrogen cream to moisten your vagina. Keep all follow-up visits. This is important. Contact a health care provider if: You feel any pain or discomfort when your pessary is in place. You continue to have stress incontinence. You have trouble keeping your pessary from falling out. You have an unusual vaginal discharge that is blood-tinged or smells bad. Summary A vaginal pessary is a removable device that is placed into your vagina to support pelvic organs that droop. This condition is called pelvic organ prolapse (POP). There are several types of pessaries. Some you can insert and remove on your own. Others must be inserted and removed  by your health care provider. The best type for you depends on the reason you are using a pessary and the severity of your condition. It is also important to find the right size. If you can use the type that you insert and remove on your own, your health care provider will teach you how to use it and schedule checkups every few months. If you have the type that needs to be inserted and removed by your health care  provider, you will have regular appointments to have your pessary removed, cleaned, and replaced. This information is not intended to replace advice given to you by your health care provider. Make sure you discuss any questions you have with your health care provider. Document Revised: 01/03/2020 Document Reviewed: 01/03/2020 Elsevier Patient Education  2022 [employer].    Urethral Vaginal Sling A urethral vaginal sling procedure is surgery to correct urinary incontinence. Urinary incontinence is passing urine without one's control. It is common in older women and in women who have had children. In this surgery, a strong piece of material is placed under the tube that drains the bladder (urethra). This sling is made of tension-free vaginal tape or nylon mesh. It fits under the urethra like a hammock. The sling is put in position to straighten, support, and hold the urethra in its normal position. Tell a health care provider about: Any allergies you have. All medicines you are taking, including vitamins, herbs, eye drops, creams, and over-the-counter medicines. Any problems you or family members have had with anesthetic medicines. Any blood disorders you have. Any surgeries you have had. Any medical conditions you have. Whether you are pregnant or may be pregnant. What are the risks? Generally, this is a safe procedure. However, problems may occur, including: Infection. Excessive bleeding. Allergic reactions to medicines. Damage to nearby structures or organs. Problems urinating for several days or weeks. Return of the urinary incontinence. Mesh failure. Be sure to talk with your health care provider about the options you have for sling material and the risks associated with each material. What happens before the procedure? Staying hydrated Follow instructions from your health care provider about hydration, which may include: Up to 2 hours before the procedure - you may continue to  drink clear liquids, such as water, clear fruit juice, black coffee, and plain tea.  Eating and drinking restrictions Follow instructions from your health care provider about eating and drinking, which may include: 8 hours before the procedure - stop eating heavy meals or foods, such as meat, fried foods, or fatty foods. 6 hours before the procedure - stop eating light meals or foods, such as toast or cereal. 6 hours before the procedure - stop drinking milk or drinks that contain milk. 2 hours before the procedure - stop drinking clear liquids. Medicines Ask your health care provider about: Changing or stopping your regular medicines. This is especially important if you are taking diabetes medicines or blood thinners. Taking medicines such as aspirin and ibuprofen. These medicines can thin your blood. Do not take these medicines unless your health care provider tells you to take them. Taking over-the-counter medicines, vitamins, herbs, and supplements. Surgery safety Ask your health care provider: How your surgery site will be marked. What steps will be taken to help prevent infection. These steps may include: Removing hair at the surgery site. Washing skin with a germ-killing soap. Taking antibiotic medicine. General instructions Do not use any products that contain nicotine or tobacco for at least 4 weeks  before the procedure. These products include cigarettes, chewing tobacco, and vaping devices, such as e-cigarettes. If you need help quitting, ask your health care provider. Plan to have a responsible adult take you home from the hospital or clinic. What happens during the procedure? An IV will be inserted into one of your veins. You will be given one or both of the following: A medicine to make you fall asleep (general anesthetic). A medicine that is injected into your spine to numb the area below the injection site (spinal anesthetic). A catheter will be placed in your bladder to  drain urine during the procedure. An incision will be made in your vagina and on the lower part of your abdomen. The sling material will be passed around your bladder neck and stitched (sutured) to the muscles to hold the urethra in its normal position. The incisions will then be closed with sutures. The procedure may vary among health care providers and hospitals. What happens after the procedure? Your blood pressure, heart rate, breathing rate, and blood oxygen level will be monitored until you leave the hospital or clinic. You will have a catheter in place to drain your bladder. This will stay in place until your bladder is working properly on its own. You may have a gauze packing in the vagina to prevent bleeding. This will be removed in 1-2 days. Summary A urethral vaginal sling procedure is surgery to treat urinary incontinence. In this surgery, a strong piece of material is placed under the urethra to hold it in its normal position. Follow instructions from your health care provider about eating and drinking before the procedure. After surgery, you will have a urinary catheter in place until your bladder works properly on its own again. This information is not intended to replace advice given to you by your health care provider. Make sure you discuss any questions you have with your health care provider. Document Revised: 02/08/2020 Document Reviewed: 02/08/2020 Elsevier Patient Education  [employer].

## 2021-07-31 ENCOUNTER — Telehealth: Payer: Self-pay | Admitting: Obstetrics and Gynecology

## 2021-07-31 ENCOUNTER — Other Ambulatory Visit: Payer: Self-pay

## 2021-07-31 MED ORDER — FLUCONAZOLE 150 MG PO TABS: 150.0000 mg | ORAL_TABLET | Freq: Once | ORAL | 1 refills | Status: DC

## 2021-07-31 NOTE — Telephone Encounter (Signed)
Sent request to pharmacy. Patient called. Patient aware.

## 2021-07-31 NOTE — Telephone Encounter (Signed)
Pt called stating that she currently has a yeats infection- she usually gets them when her blood sugar is "out of wack", she is requesting pill tx to be sent in to [facility]. Please Advise.

## 2021-08-04 ENCOUNTER — Telehealth: Payer: Self-pay

## 2021-08-04 NOTE — Telephone Encounter (Signed)
Error

## 2021-08-26 IMAGING — DX DG TOE GREAT 2+V*L*
3 series · 3 of 3 positions shown · non-contrast
Comparison: 12/31/2016

CLINICAL DATA: Concern for infection

EXAM:
LEFT GREAT TOE

[toe ap]
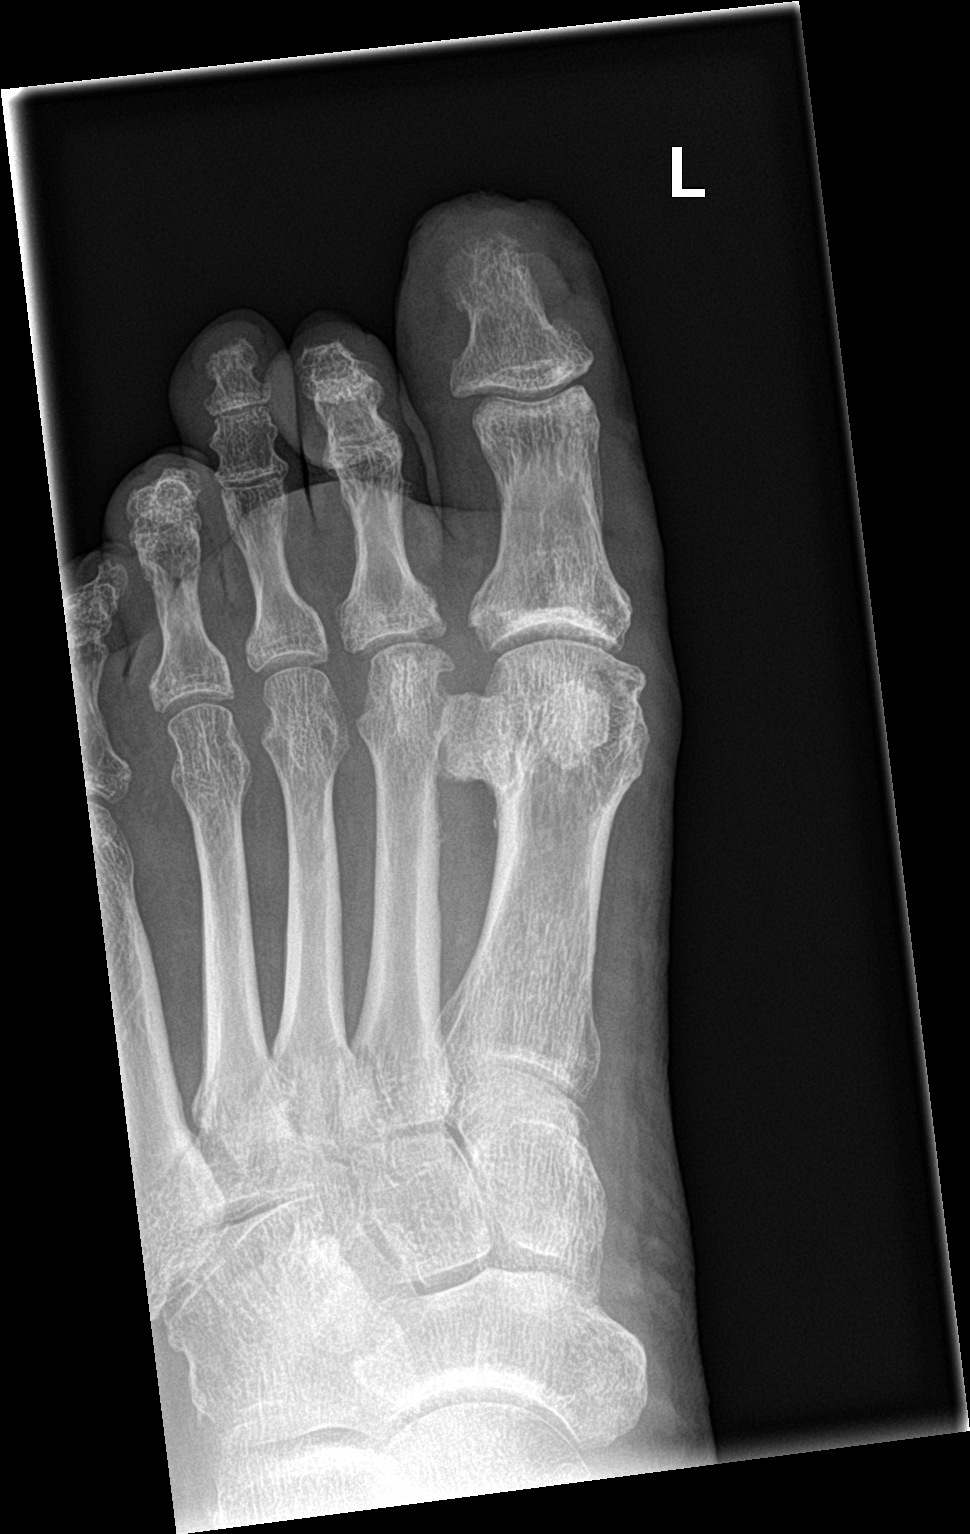

[toe obl]
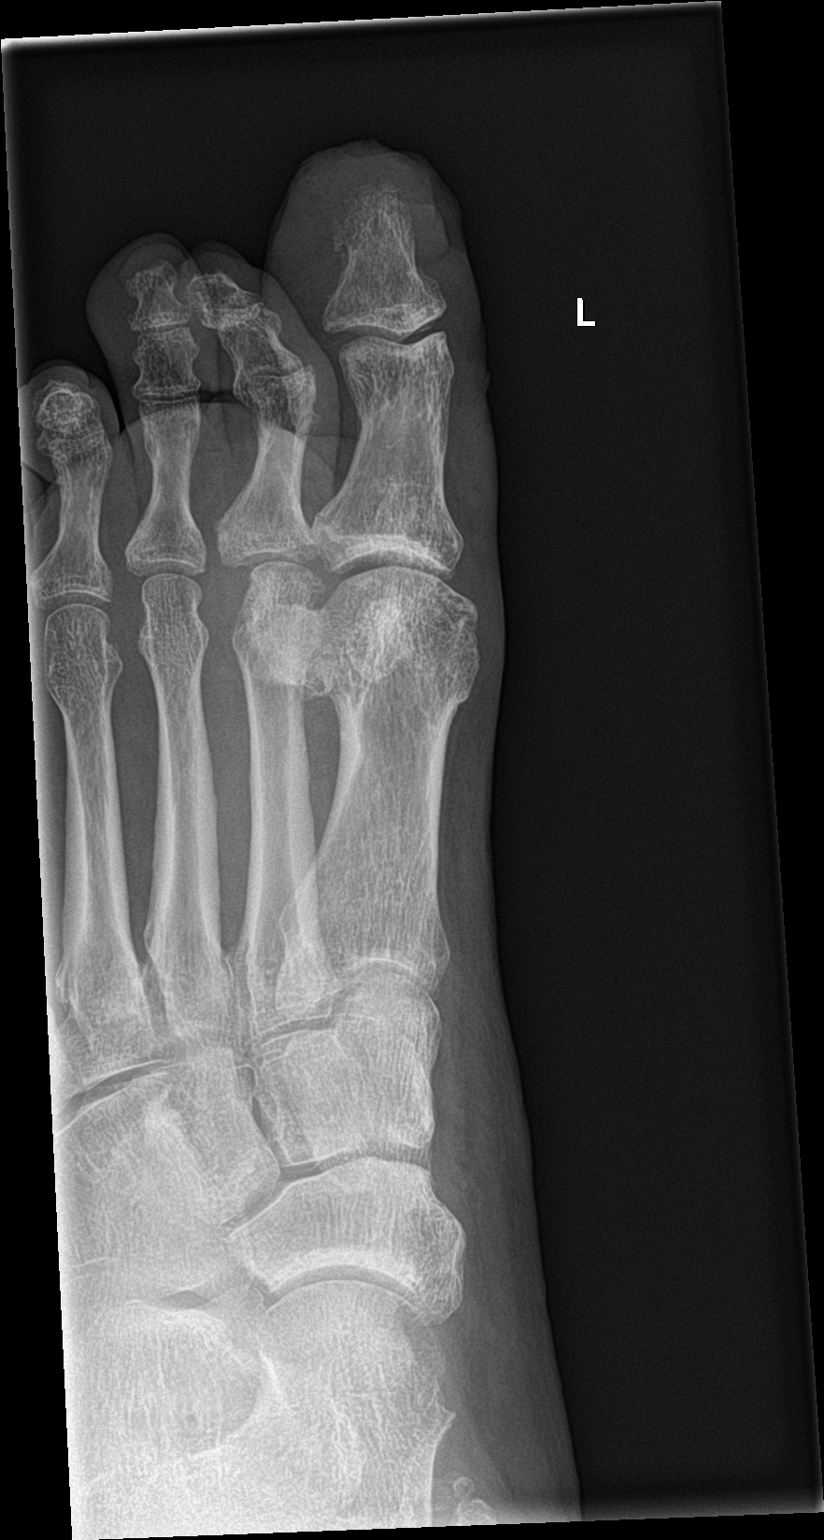

[toe lat]
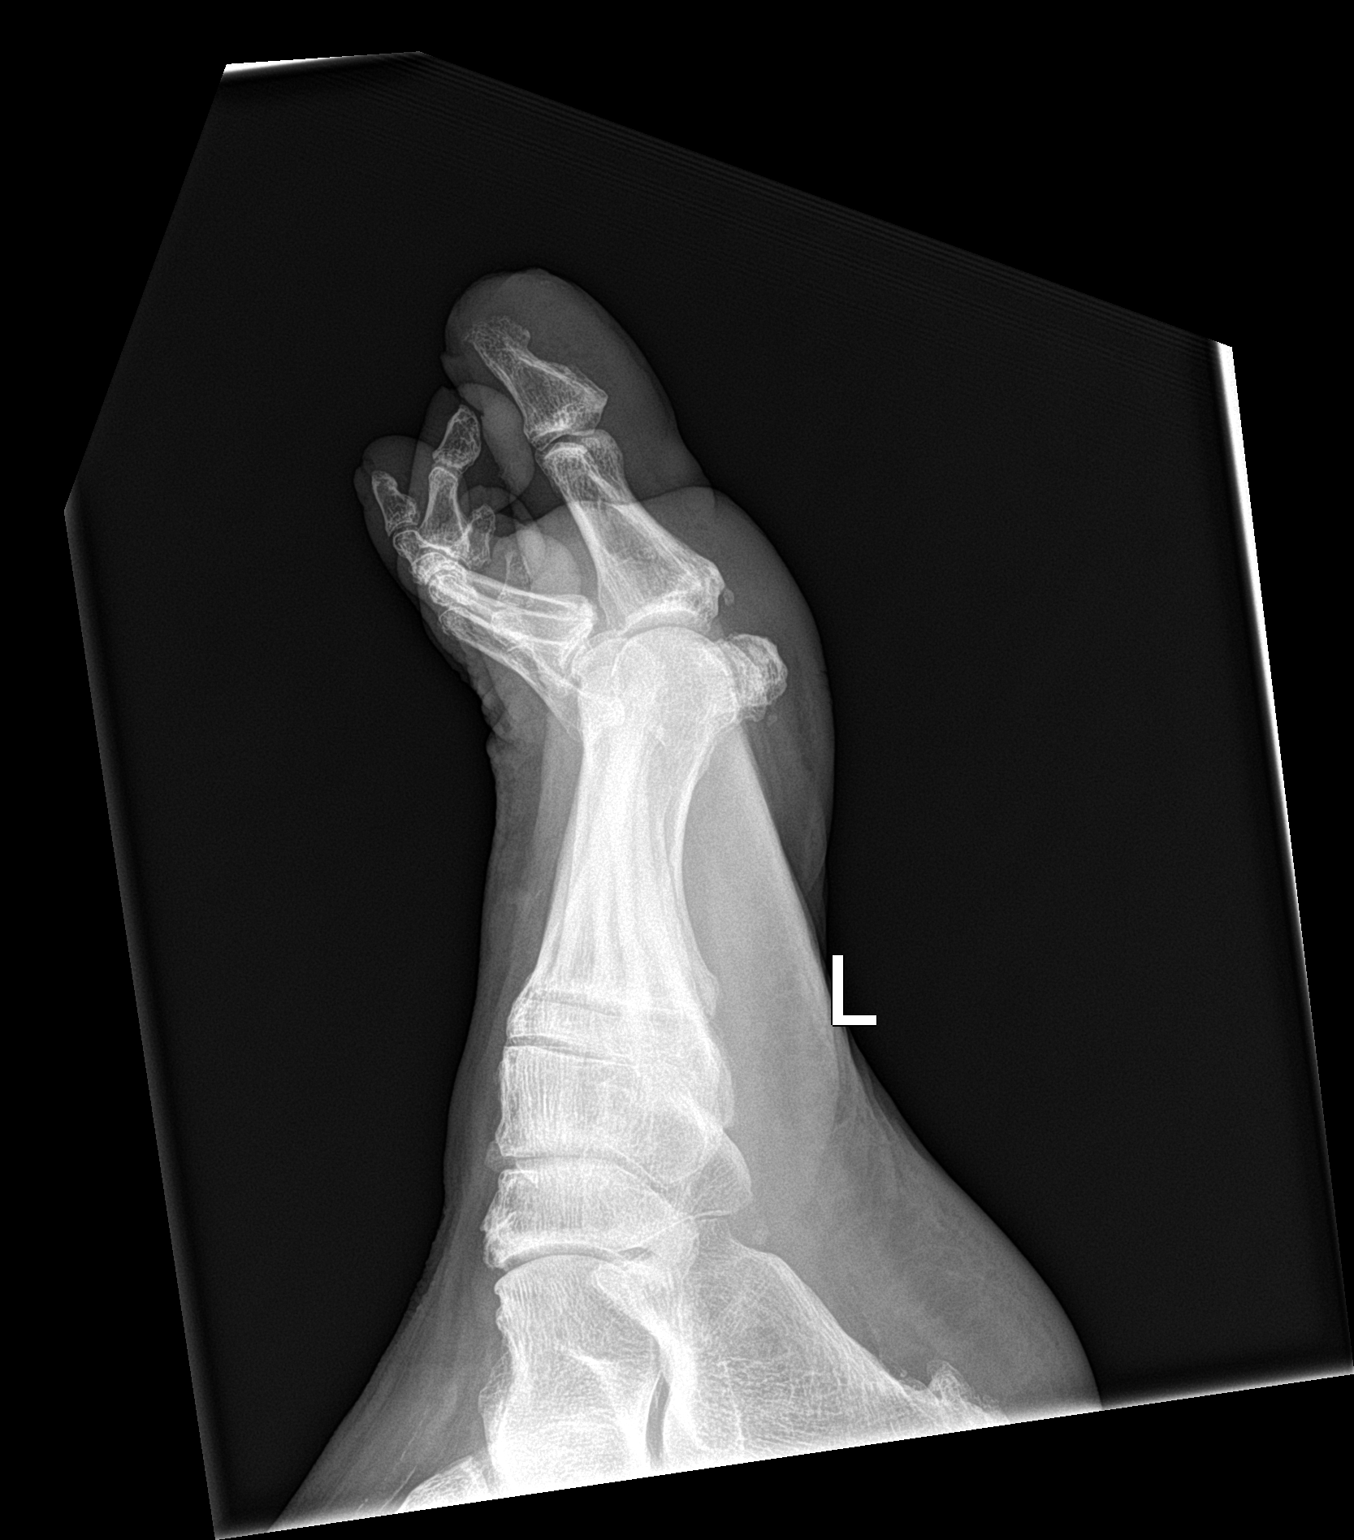

[3 of 3 positions shown; findings below may reference images not displayed]

FINDINGS: There is no fracture or dislocation of the left great toe. Subtle
bony erosion of the lateral tuft of the great toe distal phalanx is
increased compared to prior examination dated 12/31/2016. Mild first
metatarsophalangeal arthrosis. Soft tissue edema about the digit.
IMPRESSION: There is no fracture or dislocation of the left great toe. Subtle
bony erosion of the lateral tuft of the great toe distal phalanx is
increased compared to prior examination dated 12/31/2016, concerning
for osteomyelitis.

## 2021-08-31 ENCOUNTER — Other Ambulatory Visit: Payer: Self-pay | Admitting: Family Medicine

## 2021-08-31 DIAGNOSIS — Z1231 Encounter for screening mammogram for malignant neoplasm of breast: Secondary | ICD-10-CM

## 2021-09-30 ENCOUNTER — Other Ambulatory Visit: Payer: Self-pay | Admitting: Cardiovascular Disease

## 2021-09-30 NOTE — Telephone Encounter (Signed)
Patient needs an appt.

## 2021-09-30 NOTE — Telephone Encounter (Signed)
Please advise.  ? ?Looks like patients Zetia was discontinued 05/25/2021 @ her OBGYN office visit.  ? ?

## 2021-10-01 NOTE — Telephone Encounter (Signed)
LMOV  

## 2021-10-05 ENCOUNTER — Other Ambulatory Visit: Payer: Self-pay

## 2021-10-05 ENCOUNTER — Ambulatory Visit
Admission: RE | Admit: 2021-10-05 | Discharge: 2021-10-05 | Disposition: A | Discharge status: Home / Self Care | Payer: Medicare HMO | Source: Ambulatory Visit | Attending: Family Medicine | Admitting: Family Medicine

## 2021-10-05 DIAGNOSIS — Z1231 Encounter for screening mammogram for malignant neoplasm of breast: Secondary | ICD-10-CM | POA: Insufficient documentation

## 2021-10-10 ENCOUNTER — Other Ambulatory Visit: Payer: Self-pay | Admitting: Cardiovascular Disease

## 2021-10-19 ENCOUNTER — Other Ambulatory Visit: Payer: Self-pay | Admitting: Cardiovascular Disease

## 2021-10-26 ENCOUNTER — Telehealth: Payer: Self-pay | Admitting: Cardiovascular Disease

## 2021-10-26 NOTE — Telephone Encounter (Signed)
I spoke with pt today and she is aware that she hasn't been seen since 07/2020. ?Pt is overdue for  6 month f/u and has scheduled future appointment with [facility] 4/25. ?Pt was contacted to verify if she is taking Zetia 10 mg qd. ?Pt mentioned that she has been taking left over medication of zetia that she had already. ?Pt was aware that Zetia 10 mg wasn't on her current medication list and that it seemed to have been D/C by her OBGYN. ?Pt hasn't had her cholesterol checked in our system since 2021. ?Pt will be reaching out to her PCP and wants [place] to wait until she speak with them for refill. ?

## 2021-10-26 NOTE — Telephone Encounter (Signed)
Please review for refill. It is no longer on med list and appears to have been discontinued. Patient is taking Repatha. She has an appointment scheduled to see Dr. Rockey Situ on 11/10/21. Thanks! ?

## 2021-10-26 NOTE — Telephone Encounter (Signed)
?*  STAT* If patient is at the pharmacy, call can be transferred to refill team. ? ? ?1. Which medications need to be refilled? (please list name of each medication and dose if known) Zetia 10mg  ? ?2. Which pharmacy/location (including street and city if local pharmacy) is medication to be sent to? [place], [place] ? ?3. Do they need a 30 day or 90 day supply? 90 ? ?Patient states that she has been out of this medication for a week. Looks like it was discontinued by Dr. Rockey Situ last year.   ?

## 2021-11-03 ENCOUNTER — Ambulatory Visit: Payer: Medicare HMO | Admitting: Cardiovascular Disease
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
## 2021-11-10 ENCOUNTER — Ambulatory Visit (INDEPENDENT_AMBULATORY_CARE_PROVIDER_SITE_OTHER): Payer: Medicare HMO | Admitting: Cardiovascular Disease

## 2021-11-10 ENCOUNTER — Encounter: Payer: Self-pay | Admitting: Cardiovascular Disease

## 2021-11-10 VITALS — BP 116/50 | HR 99 | Ht 64.0 in | Wt 200.0 lb

## 2021-11-10 DIAGNOSIS — E782 Mixed hyperlipidemia: Secondary | ICD-10-CM

## 2021-11-10 DIAGNOSIS — Z794 Long term (current) use of insulin: Secondary | ICD-10-CM | POA: Diagnosis not present

## 2021-11-10 DIAGNOSIS — I70211 Atherosclerosis of native arteries of extremities with intermittent claudication, right leg: Secondary | ICD-10-CM

## 2021-11-10 DIAGNOSIS — I739 Peripheral vascular disease, unspecified: Secondary | ICD-10-CM

## 2021-11-10 DIAGNOSIS — E1152 Type 2 diabetes mellitus with diabetic peripheral angiopathy with gangrene: Secondary | ICD-10-CM

## 2021-11-10 DIAGNOSIS — I951 Orthostatic hypotension: Secondary | ICD-10-CM

## 2021-11-10 DIAGNOSIS — T466X5A Adverse effect of antihyperlipidemic and antiarteriosclerotic drugs, initial encounter: Secondary | ICD-10-CM

## 2021-11-10 DIAGNOSIS — M791 Myalgia, unspecified site: Secondary | ICD-10-CM

## 2021-11-10 MED ORDER — REPATHA SURECLICK 140 MG/ML ~~LOC~~ SOAJ: SUBCUTANEOUS | 11 refills | Status: DC

## 2021-11-10 NOTE — Patient Instructions (Addendum)
Labs needed: lipid panel [facility] ? ?Medication Instructions:  ?No changes ? ?If you need a refill on your cardiac medications before your next appointment, please call your pharmacy.  ? ? ?Lab work: ?No new labs needed ? ? ?Testing/Procedures: ?No new testing needed ? ? ?Follow-Up: ?At [facility], you and your health needs are our priority.  As part of our continuing mission to provide you with exceptional heart care, we have created designated Provider Care Teams.  These Care Teams include your primary Cardiologist (physician) and Advanced Practice Providers (APPs -  Physician Assistants and Nurse Practitioners) who all work together to provide you with the care you need, when you need it. ? ?You will need a follow up appointment in 12 months ? ?Providers on your designated Care Team:   ?Murray Hodgkins, NP ?Christell Faith, PA-C ?Cadence Kathlen Mody, PA-C ? ?COVID-19 Vaccine Information can be found at: [email or web address] For questions related to vaccine distribution or appointments, please email [email or web address] .[email or web address] or call [phone].  ? ?

## 2021-11-11 ENCOUNTER — Encounter: Payer: Self-pay | Admitting: Cardiovascular Disease

## 2022-03-09 ENCOUNTER — Other Ambulatory Visit: Payer: Self-pay | Admitting: Obstetrics and Gynecology

## 2022-04-12 ENCOUNTER — Other Ambulatory Visit (INDEPENDENT_AMBULATORY_CARE_PROVIDER_SITE_OTHER): Payer: Self-pay | Admitting: Vascular Surgery

## 2022-04-12 ENCOUNTER — Encounter (INDEPENDENT_AMBULATORY_CARE_PROVIDER_SITE_OTHER): Payer: Self-pay | Admitting: Vascular Surgery

## 2022-04-12 ENCOUNTER — Ambulatory Visit (INDEPENDENT_AMBULATORY_CARE_PROVIDER_SITE_OTHER): Payer: Medicare HMO

## 2022-04-12 ENCOUNTER — Ambulatory Visit (INDEPENDENT_AMBULATORY_CARE_PROVIDER_SITE_OTHER): Payer: Medicare HMO | Admitting: Vascular Surgery

## 2022-04-12 VITALS — BP 97/57 | HR 97 | Resp 16 | Wt 195.8 lb

## 2022-04-12 DIAGNOSIS — I739 Peripheral vascular disease, unspecified: Secondary | ICD-10-CM

## 2022-04-12 DIAGNOSIS — E118 Type 2 diabetes mellitus with unspecified complications: Secondary | ICD-10-CM | POA: Diagnosis not present

## 2022-04-12 DIAGNOSIS — Z9889 Other specified postprocedural states: Secondary | ICD-10-CM

## 2022-04-12 DIAGNOSIS — I70211 Atherosclerosis of native arteries of extremities with intermittent claudication, right leg: Secondary | ICD-10-CM | POA: Diagnosis not present
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
## 2022-04-13 ENCOUNTER — Encounter (INDEPENDENT_AMBULATORY_CARE_PROVIDER_SITE_OTHER): Payer: Self-pay | Admitting: Vascular Surgery

## 2022-05-17 ENCOUNTER — Encounter (INDEPENDENT_AMBULATORY_CARE_PROVIDER_SITE_OTHER): Payer: Self-pay

## 2022-05-28 NOTE — Progress Notes (Unsigned)
    GYNECOLOGY PROGRESS NOTE  Subjective:    Patient ID: Donna Fisher, female    DOB: [birth year], 56 y.o.   MRN: [id number]  HPI  Patient is a 56 y.o. G34P2103 female who presents for bladder leakage. She has been bothered with leaking urine on and off for 5-6 years. Urine leakage is gradually worsening. She has been wear incontinence underwear when she needs to go outside of the home. She gets up twice at night to urinate.   {Common ambulatory SmartLinks:19316}  Review of Systems {ros; complete:30496}   Objective:   Resp. rate 16, height 5\' 4"  (1.626 m), weight 198 lb 6.4 oz (90 kg). Body mass index is 34.06 kg/m. General appearance: {general exam:16600} Abdomen: {abdominal exam:16834} Pelvic: {pelvic exam:16852::"cervix normal in appearance","external genitalia normal","no adnexal masses or tenderness","no cervical motion tenderness","rectovaginal septum normal","uterus normal size, shape, and consistency","vagina normal without discharge"} Extremities: {extremity exam:5109} Neurologic: {neuro exam:17854}   Assessment:   No diagnosis found.   Plan:   There are no diagnoses linked to this encounter.    Rubie Maid, MD [place]

## 2022-06-01 ENCOUNTER — Ambulatory Visit (INDEPENDENT_AMBULATORY_CARE_PROVIDER_SITE_OTHER): Payer: Medicare HMO | Admitting: Obstetrics and Gynecology

## 2022-06-01 ENCOUNTER — Encounter: Payer: Self-pay | Admitting: Obstetrics and Gynecology

## 2022-06-01 VITALS — Resp 16 | Ht 64.0 in | Wt 198.4 lb

## 2022-06-01 DIAGNOSIS — Z794 Long term (current) use of insulin: Secondary | ICD-10-CM

## 2022-06-01 DIAGNOSIS — E1122 Type 2 diabetes mellitus with diabetic chronic kidney disease: Secondary | ICD-10-CM | POA: Diagnosis not present

## 2022-06-01 DIAGNOSIS — Z992 Dependence on renal dialysis: Secondary | ICD-10-CM

## 2022-06-01 DIAGNOSIS — N816 Rectocele: Secondary | ICD-10-CM

## 2022-06-01 DIAGNOSIS — N3946 Mixed incontinence: Secondary | ICD-10-CM

## 2022-06-01 DIAGNOSIS — N8110 Cystocele, unspecified: Secondary | ICD-10-CM

## 2022-06-01 DIAGNOSIS — N183 Chronic kidney disease, stage 3 unspecified: Secondary | ICD-10-CM

## 2022-06-01 MED ORDER — TOLTERODINE TARTRATE ER 4 MG PO CP24: 4.0000 mg | ORAL_CAPSULE | Freq: Every day | ORAL | 3 refills | Status: DC

## 2022-07-23 ENCOUNTER — Other Ambulatory Visit (HOSPITAL_COMMUNITY): Payer: Self-pay

## 2022-07-26 ENCOUNTER — Other Ambulatory Visit (HOSPITAL_COMMUNITY): Payer: Self-pay

## 2022-07-30 ENCOUNTER — Other Ambulatory Visit: Payer: Self-pay | Admitting: Family Medicine

## 2022-07-30 DIAGNOSIS — Z1231 Encounter for screening mammogram for malignant neoplasm of breast: Secondary | ICD-10-CM

## 2022-10-07 ENCOUNTER — Ambulatory Visit
Admission: RE | Admit: 2022-10-07 | Discharge: 2022-10-07 | Disposition: A | Discharge status: Home / Self Care | Payer: Medicare HMO | Source: Ambulatory Visit | Attending: Family Medicine | Admitting: Family Medicine

## 2022-10-07 DIAGNOSIS — Z1231 Encounter for screening mammogram for malignant neoplasm of breast: Secondary | ICD-10-CM

## 2022-10-13 ENCOUNTER — Other Ambulatory Visit: Payer: Self-pay | Admitting: Orthopaedic Surgery

## 2022-10-13 DIAGNOSIS — S82852D Displaced trimalleolar fracture of left lower leg, subsequent encounter for closed fracture with routine healing: Secondary | ICD-10-CM

## 2022-10-14 ENCOUNTER — Telehealth: Payer: Self-pay | Admitting: Cardiovascular Disease

## 2022-10-14 ENCOUNTER — Ambulatory Visit
Admission: RE | Admit: 2022-10-14 | Discharge: 2022-10-14 | Disposition: A | Discharge status: Home / Self Care | Payer: Medicare HMO | Source: Ambulatory Visit | Attending: Orthopaedic Surgery | Admitting: Orthopaedic Surgery

## 2022-10-14 DIAGNOSIS — S82852D Displaced trimalleolar fracture of left lower leg, subsequent encounter for closed fracture with routine healing: Secondary | ICD-10-CM | POA: Insufficient documentation

## 2022-10-14 NOTE — Telephone Encounter (Signed)
  Pt is requesting if her appt with Dr. Rockey Situ on 11/15/22 can be changed to virtual. Pt fell and broke her ankle and need surgery. She said, it's going to be easier for her if she can keep the appt and talk to him over the phone

## 2022-10-15 NOTE — Telephone Encounter (Signed)
Spoke with patient and reviewed provider recommendations. She states that she will just keep that appointment and come in person. She has had SBP of 77 and has had 3 falls which she thinks it is because her blood pressures are running on the low side. Currently she is fine and no issues.  Confirmed appointment with her and instructed her to call back if she should have any further symptoms or concerns. She verbalized understanding of our conversation with no further questions or concerns at this time.    Minna Merritts, MD  Sent: Thu October 14, 2022  5:07 PM   Message  May be hard to get a sense of everything going on with virtual  Does she want to delay until she has recovered then come in for in person visit?  Thx  TG
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
## 2022-11-15 ENCOUNTER — Encounter: Payer: Self-pay | Admitting: Cardiovascular Disease

## 2022-11-15 ENCOUNTER — Ambulatory Visit: Payer: Medicare HMO | Attending: Cardiovascular Disease | Admitting: Cardiovascular Disease

## 2022-11-15 VITALS — BP 70/48 | HR 95 | Ht 62.5 in | Wt 185.0 lb

## 2022-11-15 DIAGNOSIS — T466X5D Adverse effect of antihyperlipidemic and antiarteriosclerotic drugs, subsequent encounter: Secondary | ICD-10-CM

## 2022-11-15 DIAGNOSIS — Z794 Long term (current) use of insulin: Secondary | ICD-10-CM

## 2022-11-15 DIAGNOSIS — E1152 Type 2 diabetes mellitus with diabetic peripheral angiopathy with gangrene: Secondary | ICD-10-CM

## 2022-11-15 DIAGNOSIS — I739 Peripheral vascular disease, unspecified: Secondary | ICD-10-CM

## 2022-11-15 DIAGNOSIS — T466X5A Adverse effect of antihyperlipidemic and antiarteriosclerotic drugs, initial encounter: Secondary | ICD-10-CM

## 2022-11-15 DIAGNOSIS — I70211 Atherosclerosis of native arteries of extremities with intermittent claudication, right leg: Secondary | ICD-10-CM

## 2022-11-15 DIAGNOSIS — Z720 Tobacco use: Secondary | ICD-10-CM

## 2022-11-15 DIAGNOSIS — E782 Mixed hyperlipidemia: Secondary | ICD-10-CM

## 2022-11-15 DIAGNOSIS — M791 Myalgia, unspecified site: Secondary | ICD-10-CM

## 2022-11-15 MED ORDER — MIDODRINE HCL 10 MG PO TABS: 10.0000 mg | ORAL_TABLET | Freq: Three times a day (TID) | ORAL | 3 refills | Status: AC | PRN

## 2022-11-15 NOTE — Patient Instructions (Signed)
Medication Instructions:  No changes  Read about Leqvio, shot every 6 months for cholesterol  If you need a refill on your cardiac medications before your next appointment, please call your pharmacy.   Lab work: No new labs needed  Testing/Procedures: No new testing needed  Follow-Up: At [facility], you and your health needs are our priority.  As part of our continuing mission to provide you with exceptional heart care, we have created designated Provider Care Teams.  These Care Teams include your primary Cardiologist (physician) and Advanced Practice Providers (APPs -  Physician Assistants and Nurse Practitioners) who all work together to provide you with the care you need, when you need it.  You will need a follow up appointment in 12 months  Providers on your designated Care Team:   Nicolasa Ducking, NP Eula Listen, PA-C Cadence Fransico Michael, [place]  COVID-19 Vaccine Information can be found at: [email or web address] For questions related to vaccine distribution or appointments, please email [email or web address] or call [phone].

## 2022-11-22 ENCOUNTER — Other Ambulatory Visit: Payer: Self-pay | Admitting: Family Medicine

## 2022-11-22 DIAGNOSIS — J9601 Acute respiratory failure with hypoxia: Secondary | ICD-10-CM

## 2022-12-07 ENCOUNTER — Encounter: Payer: Self-pay | Admitting: Pulmonary Disease

## 2022-12-16 ENCOUNTER — Ambulatory Visit: Payer: Medicare HMO | Attending: Family Medicine

## 2022-12-16 DIAGNOSIS — J9601 Acute respiratory failure with hypoxia: Secondary | ICD-10-CM | POA: Diagnosis not present

## 2022-12-16 LAB — PULMONARY FUNCTION TEST ARMC ONLY
DL/VA % pred: 73 %
DL/VA: 3.13 ml/min/mmHg/L
DLCO unc % pred: 56 %
DLCO unc: 11.54 ml/min/mmHg
FEF 25-75 Post: 2.33 L/sec
FEF 25-75 Pre: 2.14 L/sec
FEF2575-%Change-Post: 8 %
FEF2575-%Pred-Post: 92 %
FEF2575-%Pred-Pre: 85 %
FEV1-%Change-Post: 3 %
FEV1-%Pred-Post: 74 %
FEV1-%Pred-Pre: 72 %
FEV1-Post: 1.98 L
FEV1-Pre: 1.92 L
FEV1FVC-%Change-Post: 5 %
FEV1FVC-%Pred-Pre: 103 %
FEV6-%Change-Post: -1 %
FEV6-%Pred-Post: 69 %
FEV6-%Pred-Pre: 70 %
FEV6-Post: 2.29 L
FEV6-Pre: 2.33 L
FEV6FVC-%Pred-Post: 103 %
FEV6FVC-%Pred-Pre: 103 %
FVC-%Change-Post: -2 %
FVC-%Pred-Post: 67 %
FVC-%Pred-Pre: 68 %
FVC-Post: 2.29 L
FVC-Pre: 2.34 L
Post FEV1/FVC ratio: 86 %
Post FEV6/FVC ratio: 100 %
Pre FEV1/FVC ratio: 82 %
Pre FEV6/FVC Ratio: 100 %
RV % pred: 104 %
RV: 1.99 L
TLC % pred: 86 %
TLC: 4.37 L

## 2022-12-16 MED ORDER — ALBUTEROL SULFATE (2.5 MG/3ML) 0.083% IN NEBU
2.5000 mg | INHALATION_SOLUTION | Freq: Once | RESPIRATORY_TRACT | Status: AC
  Administered 2022-12-16: 2.5 mg via RESPIRATORY_TRACT
  Filled 2022-12-16: qty 3

## 2023-04-11 ENCOUNTER — Other Ambulatory Visit (INDEPENDENT_AMBULATORY_CARE_PROVIDER_SITE_OTHER): Payer: Self-pay | Admitting: Vascular Surgery

## 2023-04-11 DIAGNOSIS — I739 Peripheral vascular disease, unspecified: Secondary | ICD-10-CM

## 2023-04-13 ENCOUNTER — Ambulatory Visit (INDEPENDENT_AMBULATORY_CARE_PROVIDER_SITE_OTHER): Payer: Medicare HMO

## 2023-04-13 ENCOUNTER — Encounter (INDEPENDENT_AMBULATORY_CARE_PROVIDER_SITE_OTHER): Payer: Self-pay | Admitting: Nurse Practitioner

## 2023-04-13 ENCOUNTER — Ambulatory Visit (INDEPENDENT_AMBULATORY_CARE_PROVIDER_SITE_OTHER): Payer: Medicare HMO | Admitting: Nurse Practitioner

## 2023-04-13 VITALS — BP 91/56 | HR 98 | Resp 18 | Ht 62.0 in | Wt 172.6 lb

## 2023-04-13 DIAGNOSIS — E118 Type 2 diabetes mellitus with unspecified complications: Secondary | ICD-10-CM | POA: Diagnosis not present

## 2023-04-13 DIAGNOSIS — I70211 Atherosclerosis of native arteries of extremities with intermittent claudication, right leg: Secondary | ICD-10-CM | POA: Diagnosis not present

## 2023-04-13 DIAGNOSIS — I739 Peripheral vascular disease, unspecified: Secondary | ICD-10-CM

## 2023-04-13 DIAGNOSIS — Z9889 Other specified postprocedural states: Secondary | ICD-10-CM | POA: Diagnosis not present

## 2023-04-13 DIAGNOSIS — R55 Syncope and collapse: Secondary | ICD-10-CM | POA: Diagnosis not present
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
## 2023-05-02 LAB — COLOGUARD: COLOGUARD: NEGATIVE

## 2023-05-05 LAB — VAS US ABI WITH/WO TBI
Left ABI: 0.93
Right ABI: 1.17

## 2023-05-19 ENCOUNTER — Other Ambulatory Visit (INDEPENDENT_AMBULATORY_CARE_PROVIDER_SITE_OTHER): Payer: Self-pay | Admitting: Nurse Practitioner

## 2023-05-19 DIAGNOSIS — R55 Syncope and collapse: Secondary | ICD-10-CM

## 2023-05-23 ENCOUNTER — Encounter (INDEPENDENT_AMBULATORY_CARE_PROVIDER_SITE_OTHER): Payer: Self-pay | Admitting: Vascular Surgery

## 2023-05-23 ENCOUNTER — Ambulatory Visit (INDEPENDENT_AMBULATORY_CARE_PROVIDER_SITE_OTHER): Payer: Medicare HMO | Admitting: Vascular Surgery

## 2023-05-23 ENCOUNTER — Ambulatory Visit (INDEPENDENT_AMBULATORY_CARE_PROVIDER_SITE_OTHER): Payer: Medicare HMO

## 2023-05-23 VITALS — BP 95/57 | HR 93 | Resp 18 | Ht 63.0 in | Wt 165.0 lb

## 2023-05-23 DIAGNOSIS — I70222 Atherosclerosis of native arteries of extremities with rest pain, left leg: Secondary | ICD-10-CM

## 2023-05-23 DIAGNOSIS — I70211 Atherosclerosis of native arteries of extremities with intermittent claudication, right leg: Secondary | ICD-10-CM

## 2023-05-23 DIAGNOSIS — I6523 Occlusion and stenosis of bilateral carotid arteries: Secondary | ICD-10-CM | POA: Diagnosis not present

## 2023-05-23 DIAGNOSIS — E1152 Type 2 diabetes mellitus with diabetic peripheral angiopathy with gangrene: Secondary | ICD-10-CM

## 2023-05-23 DIAGNOSIS — R42 Dizziness and giddiness: Secondary | ICD-10-CM

## 2023-05-23 DIAGNOSIS — R55 Syncope and collapse: Secondary | ICD-10-CM

## 2023-05-23 DIAGNOSIS — Z794 Long term (current) use of insulin: Secondary | ICD-10-CM
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
## 2023-05-26 ENCOUNTER — Telehealth (INDEPENDENT_AMBULATORY_CARE_PROVIDER_SITE_OTHER): Payer: Self-pay | Admitting: Vascular Surgery

## 2023-05-26 NOTE — Telephone Encounter (Signed)
Patient called to schedule surgery. Was seen by GS on 05-23-23.  Dates that won't work per pt: 06-03-23 & 06-23-23.

## 2023-05-29 ENCOUNTER — Encounter (INDEPENDENT_AMBULATORY_CARE_PROVIDER_SITE_OTHER): Payer: Self-pay | Admitting: Vascular Surgery

## 2023-05-29 DIAGNOSIS — I6529 Occlusion and stenosis of unspecified carotid artery: Secondary | ICD-10-CM | POA: Insufficient documentation

## 2023-05-29 DIAGNOSIS — I70229 Atherosclerosis of native arteries of extremities with rest pain, unspecified extremity: Secondary | ICD-10-CM | POA: Insufficient documentation

## 2023-06-02 ENCOUNTER — Telehealth (INDEPENDENT_AMBULATORY_CARE_PROVIDER_SITE_OTHER): Payer: Self-pay

## 2023-06-02 NOTE — Telephone Encounter (Signed)
Spoke with the patient and she is scheduled with Dr. Gilda Crease, on 06/14/23 with a 10:30 am arrival time to the [facility] for a left leg angio. Pre-procedure instructions will be sent to Mychart and mailed.

## 2023-06-14 ENCOUNTER — Encounter
Admission: RE | Disposition: A | Discharge status: Home / Self Care | Payer: Self-pay | Source: Home / Self Care | Attending: Vascular Surgery

## 2023-06-14 ENCOUNTER — Ambulatory Visit
Admission: RE | Admit: 2023-06-14 | Discharge: 2023-06-14 | Disposition: A | Discharge status: Home / Self Care | Payer: Medicare HMO | Attending: Vascular Surgery | Admitting: Vascular Surgery

## 2023-06-14 ENCOUNTER — Other Ambulatory Visit: Payer: Self-pay

## 2023-06-14 ENCOUNTER — Encounter: Payer: Self-pay | Admitting: Vascular Surgery

## 2023-06-14 DIAGNOSIS — I2510 Atherosclerotic heart disease of native coronary artery without angina pectoris: Secondary | ICD-10-CM | POA: Insufficient documentation

## 2023-06-14 DIAGNOSIS — I6523 Occlusion and stenosis of bilateral carotid arteries: Secondary | ICD-10-CM | POA: Insufficient documentation

## 2023-06-14 DIAGNOSIS — I129 Hypertensive chronic kidney disease with stage 1 through stage 4 chronic kidney disease, or unspecified chronic kidney disease: Secondary | ICD-10-CM | POA: Diagnosis not present

## 2023-06-14 DIAGNOSIS — R42 Dizziness and giddiness: Secondary | ICD-10-CM | POA: Insufficient documentation

## 2023-06-14 DIAGNOSIS — E1122 Type 2 diabetes mellitus with diabetic chronic kidney disease: Secondary | ICD-10-CM | POA: Diagnosis not present

## 2023-06-14 DIAGNOSIS — I70222 Atherosclerosis of native arteries of extremities with rest pain, left leg: Secondary | ICD-10-CM | POA: Diagnosis not present

## 2023-06-14 DIAGNOSIS — Z95828 Presence of other vascular implants and grafts: Secondary | ICD-10-CM

## 2023-06-14 DIAGNOSIS — Z9889 Other specified postprocedural states: Secondary | ICD-10-CM

## 2023-06-14 DIAGNOSIS — I700 Atherosclerosis of aorta: Secondary | ICD-10-CM

## 2023-06-14 DIAGNOSIS — N1832 Chronic kidney disease, stage 3b: Secondary | ICD-10-CM | POA: Diagnosis not present

## 2023-06-14 DIAGNOSIS — E1151 Type 2 diabetes mellitus with diabetic peripheral angiopathy without gangrene: Secondary | ICD-10-CM | POA: Diagnosis present

## 2023-06-14 DIAGNOSIS — I70229 Atherosclerosis of native arteries of extremities with rest pain, unspecified extremity: Secondary | ICD-10-CM

## 2023-06-14 DIAGNOSIS — E785 Hyperlipidemia, unspecified: Secondary | ICD-10-CM | POA: Insufficient documentation

## 2023-06-14 DIAGNOSIS — F17210 Nicotine dependence, cigarettes, uncomplicated: Secondary | ICD-10-CM | POA: Diagnosis not present

## 2023-06-14 HISTORY — PX: LOWER EXTREMITY ANGIOGRAPHY: CATH118251

## 2023-06-14 LAB — GLUCOSE, CAPILLARY: Glucose-Capillary: 149 mg/dL — ABNORMAL HIGH (ref 70–99)

## 2023-06-14 LAB — BUN: BUN: 30 mg/dL — ABNORMAL HIGH (ref 6–20)

## 2023-06-14 LAB — CREATININE, SERUM
Creatinine, Ser: 1.76 mg/dL — ABNORMAL HIGH (ref 0.44–1.00)
GFR, Estimated: 33 mL/min — ABNORMAL LOW (ref >60)

## 2023-06-14 SURGERY — LOWER EXTREMITY ANGIOGRAPHY
Anesthesia: Moderate Sedation | Site: Leg Lower | Laterality: Left

## 2023-06-14 MED ORDER — SODIUM CHLORIDE 0.9% FLUSH: 3.0000 mL | INTRAVENOUS | Status: DC | PRN

## 2023-06-14 MED ORDER — DIPHENHYDRAMINE HCL 50 MG/ML IJ SOLN: 50.0000 mg | Freq: Once | INTRAMUSCULAR | Status: DC | PRN

## 2023-06-14 MED ORDER — IODIXANOL 320 MG/ML IV SOLN
INTRAVENOUS | Status: DC | PRN
  Administered 2023-06-14: 60 mL

## 2023-06-14 MED ORDER — LIDOCAINE HCL (PF) 1 % IJ SOLN
INTRAMUSCULAR | Status: DC | PRN
  Administered 2023-06-14: 10 mL

## 2023-06-14 MED ORDER — FENTANYL CITRATE (PF) 100 MCG/2ML IJ SOLN
INTRAMUSCULAR | Status: DC | PRN
  Administered 2023-06-14: 25 ug via INTRAVENOUS

## 2023-06-14 MED ORDER — MIDAZOLAM HCL 5 MG/5ML IJ SOLN
INTRAMUSCULAR | Status: AC
  Filled 2023-06-14: qty 5

## 2023-06-14 MED ORDER — ONDANSETRON HCL 4 MG/2ML IJ SOLN: 4.0000 mg | Freq: Four times a day (QID) | INTRAMUSCULAR | Status: DC | PRN

## 2023-06-14 MED ORDER — MIDAZOLAM HCL 2 MG/2ML IJ SOLN
INTRAMUSCULAR | Status: DC | PRN
  Administered 2023-06-14: 2 mg via INTRAVENOUS

## 2023-06-14 MED ORDER — MIDAZOLAM HCL 2 MG/2ML IJ SOLN
INTRAMUSCULAR | Status: DC | PRN
  Administered 2023-06-14: 1 mg via INTRAVENOUS

## 2023-06-14 MED ORDER — VANCOMYCIN HCL IN DEXTROSE 1-5 GM/200ML-% IV SOLN
1000.0000 mg | INTRAVENOUS | Status: AC
  Administered 2023-06-14: 1000 mg via INTRAVENOUS

## 2023-06-14 MED ORDER — FENTANYL CITRATE (PF) 100 MCG/2ML IJ SOLN
INTRAMUSCULAR | Status: AC
  Filled 2023-06-14: qty 2

## 2023-06-14 MED ORDER — FENTANYL CITRATE (PF) 100 MCG/2ML IJ SOLN
INTRAMUSCULAR | Status: DC | PRN
  Administered 2023-06-14: 50 ug via INTRAVENOUS

## 2023-06-14 MED ORDER — SODIUM CHLORIDE 0.9% FLUSH: 3.0000 mL | Freq: Two times a day (BID) | INTRAVENOUS | Status: DC

## 2023-06-14 MED ORDER — HYDROMORPHONE HCL 1 MG/ML IJ SOLN: 1.0000 mg | Freq: Once | INTRAMUSCULAR | Status: DC | PRN

## 2023-06-14 MED ORDER — ASPIRIN 81 MG PO TBEC: 81.0000 mg | DELAYED_RELEASE_TABLET | Freq: Every day | ORAL | 2 refills | Status: DC

## 2023-06-14 MED ORDER — FAMOTIDINE 20 MG PO TABS: 40.0000 mg | ORAL_TABLET | Freq: Once | ORAL | Status: DC | PRN

## 2023-06-14 MED ORDER — CLOPIDOGREL BISULFATE 75 MG PO TABS
ORAL_TABLET | ORAL | Status: AC
  Administered 2023-06-14: 300 mg via ORAL
  Filled 2023-06-14: qty 4

## 2023-06-14 MED ORDER — HYDRALAZINE HCL 20 MG/ML IJ SOLN: 5.0000 mg | INTRAMUSCULAR | Status: DC | PRN

## 2023-06-14 MED ORDER — LABETALOL HCL 5 MG/ML IV SOLN: 10.0000 mg | INTRAVENOUS | Status: DC | PRN

## 2023-06-14 MED ORDER — HEPARIN (PORCINE) IN NACL 1000-0.9 UT/500ML-% IV SOLN
INTRAVENOUS | Status: DC | PRN
  Administered 2023-06-14: 1000 mL

## 2023-06-14 MED ORDER — CLOPIDOGREL BISULFATE 300 MG PO TABS: 300.0000 mg | ORAL_TABLET | ORAL | Status: AC

## 2023-06-14 MED ORDER — SODIUM CHLORIDE 0.9 % IV SOLN: INTRAVENOUS | Status: DC

## 2023-06-14 MED ORDER — METHYLPREDNISOLONE SODIUM SUCC 125 MG IJ SOLR: 125.0000 mg | Freq: Once | INTRAMUSCULAR | Status: DC | PRN

## 2023-06-14 MED ORDER — HEPARIN SODIUM (PORCINE) 1000 UNIT/ML IJ SOLN
INTRAMUSCULAR | Status: DC | PRN
  Administered 2023-06-14: 6000 [IU] via INTRAVENOUS

## 2023-06-14 MED ORDER — CLOPIDOGREL BISULFATE 75 MG PO TABS: 75.0000 mg | ORAL_TABLET | Freq: Every day | ORAL | 11 refills | Status: DC

## 2023-06-14 MED ORDER — VANCOMYCIN HCL IN DEXTROSE 1-5 GM/200ML-% IV SOLN
INTRAVENOUS | Status: AC
  Filled 2023-06-14: qty 200

## 2023-06-14 MED ORDER — HEPARIN SODIUM (PORCINE) 1000 UNIT/ML IJ SOLN
INTRAMUSCULAR | Status: AC
  Filled 2023-06-14: qty 10

## 2023-06-14 MED ORDER — MIDAZOLAM HCL 2 MG/ML PO SYRP: 8.0000 mg | ORAL_SOLUTION | Freq: Once | ORAL | Status: DC | PRN

## 2023-06-14 MED ORDER — SODIUM CHLORIDE 0.9 % IV SOLN: 250.0000 mL | INTRAVENOUS | Status: DC | PRN

## 2023-06-14 SURGICAL SUPPLY — 20 items
BALLN LUTONIX 5X150X130 (BALLOONS) ×1
BALLN LUTONIX DCB 5X80X130 (BALLOONS) ×2
BALLOON LUTONIX 5X150X130 (BALLOONS) IMPLANT
BALLOON LUTONIX DCB 5X80X130 (BALLOONS) IMPLANT
CATH ANGIO 5F PIGTAIL 65CM (CATHETERS) IMPLANT
COVER PROBE ULTRASOUND 5X96 (MISCELLANEOUS) IMPLANT
DEVICE PRESTO INFLATION (MISCELLANEOUS) IMPLANT
DEVICE STARCLOSE SE CLOSURE (Vascular Products) IMPLANT
GLIDEWIRE ADV .035X260CM (WIRE) IMPLANT
GOWN STRL REUS W/ TWL LRG LVL3 (GOWN DISPOSABLE) ×1 IMPLANT
NDL ENTRY 21GA 7CM ECHOTIP (NEEDLE) IMPLANT
NEEDLE ENTRY 21GA 7CM ECHOTIP (NEEDLE) ×1
PACK ANGIOGRAPHY (CUSTOM PROCEDURE TRAY) ×1 IMPLANT
SET INTRO CAPELLA COAXIAL (SET/KITS/TRAYS/PACK) IMPLANT
SHEATH ANL2 6FRX45 HC (SHEATH) IMPLANT
SHEATH BRITE TIP 5FRX11 (SHEATH) IMPLANT
STENT LIFESTENT 5F 6X120X135 (Permanent Stent) IMPLANT
SYR MEDRAD MARK 7 150ML (SYRINGE) IMPLANT
TUBING CONTRAST HIGH PRESS 72 (TUBING) IMPLANT
WIRE GUIDERIGHT .035X150 (WIRE) IMPLANT

## 2023-06-14 NOTE — Op Note (Signed)
[facility]  Percutaneous Study/Intervention Procedural Note   Date of Surgery: 06/14/2023  Surgeon:  Renford Dills, MD.  Pre-operative Diagnosis: Atherosclerotic occlusive disease bilateral lower extremities with rest pain of the left lower extremity  Post-operative diagnosis:  Same  Procedure(s) Performed:             1.  Introduction catheter into left lower extremity 3rd order catheter placement              2.    Contrast injection left lower extremity for distal runoff             3.  Percutaneous transluminal angioplasty and stent placement left superficial femoral artery and above-knee popliteal             4.  Star close closure right common femoral arteriotomy  Anesthesia: Conscious sedation was administered under my direct supervision by the interventional radiology RN. IV Versed plus fentanyl were utilized. Continuous ECG, pulse oximetry and blood pressure was monitored throughout the entire procedure.  Conscious sedation was for a total of 40 minutes and 15 seconds.  Sheath: 6 [place] Ansell right common femoral retrograde  Contrast: 60 cc  Fluoroscopy Time: 5.9 minutes  Indications:  Donna Fisher presents with increasing pain of the left lower extremity.  Noninvasive studies as well as physical examinations show a significant deterioration in her arterial status.  This places her at increased risk for limb loss.  Angiography with hope for intervention is recommended.  The risks and benefits are reviewed all questions answered patient agrees to proceed.  Procedure:  Donna Fisher is a 57 y.o. y.o. female who was identified and appropriate procedural time out was performed.  The patient was then placed supine on the table and prepped and draped in the usual sterile fashion.    Ultrasound was placed in the sterile sleeve and the right groin was evaluated the right common femoral artery was echolucent and pulsatile indicating patency.  Image  was recorded for the permanent record and under real-time visualization a microneedle was inserted into the common femoral artery microwire followed by a micro-sheath.  A J-wire was then advanced through the micro-sheath and a  5 [place] sheath was then inserted over a J-wire. J-wire was then advanced and a 5 French pigtail catheter was positioned at the level of T12. AP projection of the aorta was then obtained. Pigtail catheter was repositioned to above the bifurcation and a RAO view of the pelvis was obtained.  Subsequently a pigtail catheter with the stiff angle Glidewire was used to cross the aortic bifurcation the catheter wire were advanced down into the left distal external iliac artery. Oblique view of the femoral bifurcation was then obtained and subsequently the wire was reintroduced and the pigtail catheter negotiated into the SFA representing third order catheter placement. Distal runoff was then performed.  6000 units of heparin was then given and allowed to circulate and a 6 [place] Ansell sheath was advanced up and over the bifurcation and positioned in the femoral artery  KMP  catheter and stiff angle Glidewire were then negotiated down into the distal popliteal.  Distal runoff was then completed by hand injection through the catheter. The wire was then reintroduced and a 5 mm x 80 mm Lutonix drug-eluting balloon was used to angioplasty the distal superficial femoral and proximal above-knee popliteal arteries. Inflation was to 10 atmospheres for 2 minutes. Follow-up imaging demonstrated greater than 60% residual stenosis and a 6  mm x 120 mm life stent was deployed.  It was postdilated with a 5 mm x 150 mm Lutonix drug-eluting balloon inflated to 8 atm for approximately 1 minute.  A second 5 mm Lutonix balloon was used to treat the proximal portion of the existing stent.  Inflation was to 8 atm for approximately 1 minute.  Distal runoff was then obtained and there was less than 10% residual  stenosis with preservation of three-vessel runoff.  After review of these images the sheath is pulled into the right external iliac oblique of the common femoral is obtained and a Star close device deployed. There no immediate complications.   Findings:  The abdominal aorta is opacified with a bolus injection contrast. Renal arteries are single and widely patent. The aorta itself has diffuse disease but no hemodynamically significant lesions. The common and external iliac arteries are widely patent bilaterally.  The left common femoral is widely patent as is the profunda femoris.  The SFA does indeed have a significant stenosis essentially in its distal portion at Hunter's canal just 5 cm below the existing SFA stent.  There is moderate to severe in-stent restenosis noted as well.  The mid and distal popliteal are widely patent.  The trifurcation is widely patent and there is three-vessel runoff to the foot.    Following angioplasty of the distal SFA and proximal above-knee popliteal artery there is greater than 50% residual stenosis and therefore stent is deployed and postdilated with 5 mm Lutonix drug-eluting balloons.  Follow-up angiography shows the SFA and above-knee popliteal at Hunter's canal yields an excellent result with less than 10% residual stenosis.    Summary: Successful recanalization left lower extremity for limb salvage                        Disposition: Patient was taken to the recovery room in stable condition having tolerated the procedure well.  Etai Copado, Latina Craver 06/14/2023,12:23 PM

## 2023-06-14 NOTE — Interval H&P Note (Signed)
History and Physical Interval Note:  06/14/2023 11:24 AM  Donna Fisher  has presented today for surgery, with the diagnosis of LLE Angio   ASO w rest pain.  The various methods of treatment have been discussed with the patient and family. After consideration of risks, benefits and other options for treatment, the patient has consented to  Procedure(s): Lower Extremity Angiography (Left) as a surgical intervention.  The patient's history has been reviewed, patient examined, no change in status, stable for surgery.  I have reviewed the patient's chart and labs.  Questions were answered to the patient's satisfaction.     Levora Dredge

## 2023-07-05 ENCOUNTER — Other Ambulatory Visit: Payer: Self-pay | Admitting: Obstetrics and Gynecology

## 2023-07-05 NOTE — Telephone Encounter (Signed)
That's fine, can give 3 month refill and scheduled for annual.

## 2023-07-05 NOTE — Telephone Encounter (Signed)
Called pt, no answer, left detailed msg Rx sent.

## 2023-07-12 ENCOUNTER — Other Ambulatory Visit (INDEPENDENT_AMBULATORY_CARE_PROVIDER_SITE_OTHER): Payer: Self-pay | Admitting: Vascular Surgery

## 2023-07-12 DIAGNOSIS — Z9889 Other specified postprocedural states: Secondary | ICD-10-CM

## 2023-07-18 ENCOUNTER — Ambulatory Visit (INDEPENDENT_AMBULATORY_CARE_PROVIDER_SITE_OTHER): Payer: Medicare HMO

## 2023-07-18 ENCOUNTER — Ambulatory Visit (INDEPENDENT_AMBULATORY_CARE_PROVIDER_SITE_OTHER): Payer: Medicare HMO | Admitting: Vascular Surgery

## 2023-07-18 VITALS — BP 107/70 | HR 96 | Resp 16 | Wt 170.0 lb

## 2023-07-18 DIAGNOSIS — I6523 Occlusion and stenosis of bilateral carotid arteries: Secondary | ICD-10-CM | POA: Diagnosis not present

## 2023-07-18 DIAGNOSIS — I70211 Atherosclerosis of native arteries of extremities with intermittent claudication, right leg: Secondary | ICD-10-CM | POA: Diagnosis not present

## 2023-07-18 DIAGNOSIS — E782 Mixed hyperlipidemia: Secondary | ICD-10-CM | POA: Diagnosis not present

## 2023-07-18 DIAGNOSIS — E118 Type 2 diabetes mellitus with unspecified complications: Secondary | ICD-10-CM | POA: Diagnosis not present

## 2023-07-18 DIAGNOSIS — I739 Peripheral vascular disease, unspecified: Secondary | ICD-10-CM

## 2023-07-18 DIAGNOSIS — Z9889 Other specified postprocedural states: Secondary | ICD-10-CM | POA: Diagnosis not present
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
## 2023-07-19 LAB — VAS US ABI WITH/WO TBI
Left ABI: 1.23
Right ABI: 1.29

## 2023-07-22 ENCOUNTER — Encounter (INDEPENDENT_AMBULATORY_CARE_PROVIDER_SITE_OTHER): Payer: Self-pay | Admitting: Vascular Surgery

## 2023-08-12 ENCOUNTER — Other Ambulatory Visit: Payer: Self-pay | Admitting: Podiatry

## 2023-08-17 ENCOUNTER — Encounter
Admission: RE | Admit: 2023-08-17 | Discharge: 2023-08-17 | Disposition: A | Discharge status: Home / Self Care | Payer: Medicare HMO | Source: Ambulatory Visit | Attending: Podiatry | Admitting: Podiatry

## 2023-08-17 ENCOUNTER — Other Ambulatory Visit: Payer: Self-pay

## 2023-08-17 ENCOUNTER — Inpatient Hospital Stay: Admission: RE | Admit: 2023-08-17 | Payer: Medicare HMO | Source: Ambulatory Visit

## 2023-08-17 NOTE — Patient Instructions (Addendum)
Your procedure is scheduled on:  Friday January 31  Report to the Registration Desk on the 1st floor of the [facility] To find out your arrival time, please call [phone] between 1PM - 3PM on:  Thursday January 30  If your arrival time is 6:00 am, do not arrive before that time as the [facility] entrance doors do not open until 6:00 am.  REMEMBER: Instructions that are not followed completely may result in serious medical risk, up to and including death; or upon the discretion of your surgeon and anesthesiologist your surgery may need to be rescheduled.  Do not eat food after midnight the night before surgery.  No gum chewing or hard candies.  You may however, WATER 2 hours before you are scheduled to arrive for your surgery. Do not drink anything within 2 hours of your scheduled arrival time.  In addition, your doctor has ordered for you to drink the provided:  Gatorade G2 Drinking this carbohydrate drink up to two hours before surgery helps to reduce insulin resistance and improve patient outcomes. Please complete drinking 2 hours before scheduled arrival time.  One week prior to surgery:  Friday January 24  Stop Anti-inflammatories (NSAIDS) such as Advil, Aleve, Ibuprofen, Motrin, Naproxen, Naprosyn and Aspirin based products such as Excedrin, Goody's Powder, BC Powder.  Stop ANY OVER THE COUNTER supplements until after surgery.  You may however, continue to take Tylenol if needed for pain up until the day of surgery.  **Follow guidelines for insulin and diabetes medications.** empagliflozin (JARDIANCE)  hold for 3 days prior to surgery, last dose Monday January 27  LANTUS SOLOSTAR take the night before but NOT the morning of the procedure  NOVOLOG FLEXPEN Thursday January 30 take evening no morning dose the day of the procedure January 31 OZEMPIC hold for 7 days , last dose Thursday January 23   **Follow recommendations regarding stopping blood thinners.** aspirin EC 325   Continue taking all of your other prescription medications up until the day of surgery.  ON THE DAY OF SURGERY ONLY TAKE THESE MEDICATIONS WITH SIPS OF WATER:  calcitRIOL (ROCALTROL)  ezetimibe (ZETIA)  gabapentin (NEURONTIN)  midodrine (PROAMATINE)  tolterodine (DETROL LA)   No Alcohol for 24 hours before or after surgery.  No Smoking including e-cigarettes for 24 hours before surgery.  No chewable tobacco products for at least 6 hours before surgery.  No nicotine patches on the day of surgery.  Do not use any "recreational" drugs for at least a week (preferably 2 weeks) before your surgery.  Please be advised that the combination of cocaine and anesthesia may have negative outcomes, up to and including death. If you test positive for cocaine, your surgery will be cancelled.  On the morning of surgery brush your teeth with toothpaste and water, you may rinse your mouth with mouthwash if you wish. Do not swallow any toothpaste or mouthwash.  Use CHG Soap as directed on instruction sheet.  Do not wear jewelry, make-up, hairpins, clips or nail polish.  For welded (permanent) jewelry: bracelets, anklets, waist bands, etc.  Please have this removed prior to surgery.  If it is not removed, there is a chance that hospital personnel will need to cut it off on the day of surgery.  Do not wear lotions, powders, or perfumes.   Do not shave body hair from the neck down 48 hours before surgery.  Contact lenses, hearing aids and dentures may not be worn into surgery.  Do not bring  valuables to the hospital. [facility] is not responsible for any missing/lost belongings or valuables.   Notify your doctor if there is any change in your medical condition (cold, fever, infection).  Wear comfortable clothing (specific to your surgery type) to the hospital.  After surgery, you can help prevent lung complications by doing breathing exercises.  Take deep breaths and cough every 1-2  hours.  If you are being discharged the day of surgery, you will not be allowed to drive home. You will need a responsible individual to drive you home and stay with you for 24 hours after surgery.   If you are taking public transportation, you will need to have a responsible individual with you.  Please call the Pre-admissions Testing Dept. at [phone] if you have any questions about these instructions.  Surgery Visitation Policy:  Patients having surgery or a procedure may have two visitors.  Children under the age of 65 must have an adult with them who is not the patient.  Temporary Visitor Restrictions Due to increasing cases of flu, RSV and COVID-19: Children ages 68 and under will not be able to visit patients in [facility] hospitals under most circumstances.         Preparing for Surgery with CHLORHEXIDINE GLUCONATE (CHG) Soap  Chlorhexidine Gluconate (CHG) Soap  o An antiseptic cleaner that kills germs and bonds with the skin to continue killing germs even after washing  o Used for showering the night before surgery and morning of surgery  Before surgery, you can play an important role by reducing the number of germs on your skin.  CHG (Chlorhexidine gluconate) soap is an antiseptic cleanser which kills germs and bonds with the skin to continue killing germs even after washing.  Please do not use if you have an allergy to CHG or antibacterial soaps. If your skin becomes reddened/irritated stop using the CHG.  1. Shower the NIGHT BEFORE SURGERY and the MORNING OF SURGERY with CHG soap.  2. If you choose to wash your hair, wash your hair first as usual with your normal shampoo.  3. After shampooing, rinse your hair and body thoroughly to remove the shampoo.  4. Use CHG as you would any other liquid soap. You can apply CHG directly to the skin and wash gently with a scrungie or a clean washcloth.  5. Apply the CHG soap to your body only from the neck down. Do  not use on open wounds or open sores. Avoid contact with your eyes, ears, mouth, and genitals (private parts). Wash face and genitals (private parts) with your normal soap.  6. Wash thoroughly, paying special attention to the area where your surgery will be performed.  7. Thoroughly rinse your body with warm water.  8. Do not shower/wash with your normal soap after using and rinsing off the CHG soap.  9. Pat yourself dry with a clean towel.  10. Wear clean pajamas to bed the night before surgery.  12. Place clean sheets on your bed the night of your first shower and do not sleep with pets.  13. Shower again with the CHG soap on the day of surgery prior to arriving at the hospital.  14. Do not apply any deodorants/lotions/powders.  15. Please wear clean clothes to the hospital.

## 2023-08-17 NOTE — Pre-Procedure Instructions (Signed)
Patient stated she was not previously told to stop her Jardiance, Ozempic and Aspirin 325. This nurse suggested that she call Dr. Earney Mallet office to notify them of this prior to finalizing the Pre-Admit interview. She verbalized understanding and stated she would cal the office immediately. Awaiting patient response to calling Dr. Irene Limbo office before proceeding.

## 2023-08-19 ENCOUNTER — Other Ambulatory Visit: Payer: Self-pay

## 2023-08-19 ENCOUNTER — Encounter
Admission: RE | Admit: 2023-08-19 | Discharge: 2023-08-19 | Disposition: A | Discharge status: Home / Self Care | Payer: Medicare HMO | Source: Ambulatory Visit | Attending: Podiatry | Admitting: Podiatry

## 2023-08-19 VITALS — Ht 62.5 in | Wt 165.0 lb

## 2023-08-19 DIAGNOSIS — Z794 Long term (current) use of insulin: Secondary | ICD-10-CM

## 2023-08-19 DIAGNOSIS — I739 Peripheral vascular disease, unspecified: Secondary | ICD-10-CM

## 2023-08-19 DIAGNOSIS — Z01812 Encounter for preprocedural laboratory examination: Secondary | ICD-10-CM

## 2023-08-19 NOTE — Patient Instructions (Addendum)
Your procedure is scheduled on: Tuesday February 4  Report to the Registration Desk on the 1st floor of the [facility] To find out your arrival time, please call [phone] between 1PM - 3PM on:  Monday , February 3  If your arrival time is 6:00 am, do not arrive before that time as the [facility] entrance doors do not open until 6:00 am.  REMEMBER: Instructions that are not followed completely may result in serious medical risk, up to and including death; or upon the discretion of your surgeon and anesthesiologist your surgery may need to be rescheduled.  Do not eat food after midnight the night before surgery.  No gum chewing or hard candies.  You may however, drink WATER 2 hours before you are scheduled to arrive for your surgery. Do not drink anything within 2 hours of your scheduled arrival time.   In addition, your doctor has ordered for you to drink the provided:  Gatorade G2 Drinking this carbohydrate drink up to two hours before surgery helps to reduce insulin resistance and improve patient outcomes. Please complete drinking 3 hours before scheduled arrival time.  One week prior to surgery: Starting Tuesday, January 28  Stop Anti-inflammatories (NSAIDS) such as Advil, Aleve, Ibuprofen, Motrin, Naproxen, Naprosyn and Aspirin based products such as Excedrin, Goody's Powder, BC Powder. Stop ANY OVER THE COUNTER supplements until after surgery.  You may however, continue to take Tylenol if needed for pain up until the day of surgery.  **Follow guidelines for insulin and diabetes medications.** empagliflozin (JARDIANCE) hold for 3 days prior to surgery , last dose Friday January 31  OZEMPIC hold for 7 days prior to surgery, last dose Monday January 27  Instruct patient to take:   the normal dose of insulin the night prior to surgery NO INSULIN MORNING OF SURGERY LANTUS SOLOSTAR take 12 units the night prior and DO NOT TAKE ANY THE MORING OF SURGERY  NOVOLOG FLEXPEN take  with evening meal but DO NOT TAKE the morning of surgery   **Follow recommendations regarding stopping blood thinners.** aspirin continue to take the aspirin until the day of surgery  Continue taking all of your other prescription medications up until the day of surgery.  ON THE DAY OF SURGERY DO NOT TAKE ANY MEDICATIONS THE MORNING OF SURGERY   No Alcohol for 24 hours before or after surgery.  No Smoking including e-cigarettes for 24 hours before surgery.  No chewable tobacco products for at least 6 hours before surgery.  No nicotine patches on the day of surgery.  Do not use any "recreational" drugs for at least a week (preferably 2 weeks) before your surgery.  Please be advised that the combination of cocaine and anesthesia may have negative outcomes, up to and including death. If you test positive for cocaine, your surgery will be cancelled.  On the morning of surgery brush your teeth with toothpaste and water, you may rinse your mouth with mouthwash if you wish. Do not swallow any toothpaste or mouthwash.  Use CHG Soap as directed on instruction sheet.  Do not wear jewelry, make-up, hairpins, clips or nail polish.  For welded (permanent) jewelry: bracelets, anklets, waist bands, etc.  Please have this removed prior to surgery.  If it is not removed, there is a chance that hospital personnel will need to cut it off on the day of surgery.  Do not wear lotions, powders, or perfumes.   Do not shave body hair from the neck down 48 hours  before surgery.  Contact lenses, hearing aids and dentures may not be worn into surgery.  Do not bring valuables to the hospital. [facility] is not responsible for any missing/lost belongings or valuables.   Notify your doctor if there is any change in your medical condition (cold, fever, infection).  Wear comfortable clothing (specific to your surgery type) to the hospital.  After surgery, you can help prevent lung complications by doing  breathing exercises.  Take deep breaths and cough every 1-2 hours. Your doctor may order a device called an Incentive Spirometer to help you take deep breaths.  If you are being admitted to the hospital overnight, leave your suitcase in the car. After surgery it may be brought to your room.  In case of increased patient census, it may be necessary for you, the patient, to continue your postoperative care in the Same Day Surgery department.  If you are being discharged the day of surgery, you will not be allowed to drive home. You will need a responsible individual to drive you home and stay with you for 24 hours after surgery.   If you are taking public transportation, you will need to have a responsible individual with you.  Please call the Pre-admissions Testing Dept. at [phone] if you have any questions about these instructions.  Surgery Visitation Policy:  Patients having surgery or a procedure may have two visitors.  Children under the age of 21 must have an adult with them who is not the patient.  Temporary Visitor Restrictions Due to increasing cases of flu, RSV and COVID-19: Children ages 35 and under will not be able to visit patients in [facility] hospitals under most circumstances.  Inpatient Visitation:    Visiting hours are 7 a.m. to 8 p.m. Up to four visitors are allowed at one time in a patient room. The visitors may rotate out with other people during the day.  One visitor age 7 or older may stay with the patient overnight and must be in the room by 8 p.m.         Preparing for Surgery with CHLORHEXIDINE GLUCONATE (CHG) Soap  Chlorhexidine Gluconate (CHG) Soap  o An antiseptic cleaner that kills germs and bonds with the skin to continue killing germs even after washing  o Used for showering the night before surgery and morning of surgery  Before surgery, you can play an important role by reducing the number of germs on your skin.  CHG  (Chlorhexidine gluconate) soap is an antiseptic cleanser which kills germs and bonds with the skin to continue killing germs even after washing.  Please do not use if you have an allergy to CHG or antibacterial soaps. If your skin becomes reddened/irritated stop using the CHG.  1. Shower the NIGHT BEFORE SURGERY and the MORNING OF SURGERY with CHG soap.  2. If you choose to wash your hair, wash your hair first as usual with your normal shampoo.  3. After shampooing, rinse your hair and body thoroughly to remove the shampoo.  4. Use CHG as you would any other liquid soap. You can apply CHG directly to the skin and wash gently with a scrungie or a clean washcloth.  5. Apply the CHG soap to your body only from the neck down. Do not use on open wounds or open sores. Avoid contact with your eyes, ears, mouth, and genitals (private parts). Wash face and genitals (private parts) with your normal soap.  6. Wash thoroughly, paying special attention  to the area where your surgery will be performed.  7. Thoroughly rinse your body with warm water.  8. Do not shower/wash with your normal soap after using and rinsing off the CHG soap.  9. Pat yourself dry with a clean towel.  10. Wear clean pajamas to bed the night before surgery.  12. Place clean sheets on your bed the night of your first shower and do not sleep with pets.  13. Shower again with the CHG soap on the day of surgery prior to arriving at the hospital.  14. Do not apply any deodorants/lotions/powders.  15. Please wear clean clothes to the hospital.

## 2023-08-22 ENCOUNTER — Encounter
Admission: RE | Admit: 2023-08-22 | Discharge: 2023-08-22 | Discharge status: Home / Self Care | Payer: Medicare HMO | Source: Ambulatory Visit | Attending: Podiatry | Admitting: Podiatry

## 2023-08-22 ENCOUNTER — Encounter: Payer: Self-pay | Admitting: Urgent Care

## 2023-08-22 DIAGNOSIS — E1152 Type 2 diabetes mellitus with diabetic peripheral angiopathy with gangrene: Secondary | ICD-10-CM | POA: Diagnosis not present

## 2023-08-22 DIAGNOSIS — Z01810 Encounter for preprocedural cardiovascular examination: Secondary | ICD-10-CM | POA: Diagnosis present

## 2023-08-22 DIAGNOSIS — Z01812 Encounter for preprocedural laboratory examination: Secondary | ICD-10-CM

## 2023-08-22 DIAGNOSIS — Z01818 Encounter for other preprocedural examination: Secondary | ICD-10-CM | POA: Insufficient documentation

## 2023-08-22 DIAGNOSIS — Z794 Long term (current) use of insulin: Secondary | ICD-10-CM | POA: Insufficient documentation

## 2023-08-22 DIAGNOSIS — I739 Peripheral vascular disease, unspecified: Secondary | ICD-10-CM

## 2023-08-22 LAB — CBC
HCT: 36.3 % (ref 36.0–46.0)
Hemoglobin: 11.9 g/dL — ABNORMAL LOW (ref 12.0–15.0)
MCH: 30 pg (ref 26.0–34.0)
MCHC: 32.8 g/dL (ref 30.0–36.0)
MCV: 91.4 fL (ref 80.0–100.0)
Platelets: 333 10*3/uL (ref 150–400)
RBC: 3.97 MIL/uL (ref 3.87–5.11)
RDW: 12.6 % (ref 11.5–15.5)
WBC: 10.2 10*3/uL (ref 4.0–10.5)
nRBC: 0 % (ref 0.0–0.2)

## 2023-08-22 LAB — BASIC METABOLIC PANEL
Anion gap: 9 (ref 5–15)
BUN: 34 mg/dL — ABNORMAL HIGH (ref 6–20)
CO2: 25 mmol/L (ref 22–32)
Calcium: 8.6 mg/dL — ABNORMAL LOW (ref 8.9–10.3)
Chloride: 100 mmol/L (ref 98–111)
Creatinine, Ser: 1.81 mg/dL — ABNORMAL HIGH (ref 0.44–1.00)
GFR, Estimated: 32 mL/min — ABNORMAL LOW (ref >60)
Glucose, Bld: 272 mg/dL — ABNORMAL HIGH (ref 70–99)
Potassium: 4.4 mmol/L (ref 3.5–5.1)
Sodium: 134 mmol/L — ABNORMAL LOW (ref 135–145)

## 2023-08-23 ENCOUNTER — Ambulatory Visit: Payer: Self-pay | Admitting: Urgent Care

## 2023-08-23 ENCOUNTER — Ambulatory Visit
Admission: RE | Admit: 2023-08-23 | Discharge: 2023-08-23 | Disposition: A | Discharge status: Home / Self Care | Payer: Medicare HMO | Attending: Podiatry | Admitting: Podiatry

## 2023-08-23 ENCOUNTER — Encounter: Payer: Self-pay | Admitting: Podiatry

## 2023-08-23 ENCOUNTER — Ambulatory Visit: Payer: Medicare HMO | Admitting: Certified Registered"

## 2023-08-23 ENCOUNTER — Encounter
Admission: RE | Disposition: A | Discharge status: Home / Self Care | Payer: Self-pay | Source: Home / Self Care | Attending: Podiatry

## 2023-08-23 DIAGNOSIS — E1142 Type 2 diabetes mellitus with diabetic polyneuropathy: Secondary | ICD-10-CM | POA: Insufficient documentation

## 2023-08-23 DIAGNOSIS — L97512 Non-pressure chronic ulcer of other part of right foot with fat layer exposed: Secondary | ICD-10-CM | POA: Diagnosis not present

## 2023-08-23 DIAGNOSIS — I96 Gangrene, not elsewhere classified: Secondary | ICD-10-CM | POA: Insufficient documentation

## 2023-08-23 DIAGNOSIS — E1152 Type 2 diabetes mellitus with diabetic peripheral angiopathy with gangrene: Secondary | ICD-10-CM | POA: Diagnosis not present

## 2023-08-23 DIAGNOSIS — Z89421 Acquired absence of other right toe(s): Secondary | ICD-10-CM | POA: Diagnosis not present

## 2023-08-23 DIAGNOSIS — Z01812 Encounter for preprocedural laboratory examination: Secondary | ICD-10-CM

## 2023-08-23 HISTORY — PX: AMPUTATION TOE: SHX6595

## 2023-08-23 LAB — GLUCOSE, CAPILLARY
Glucose-Capillary: 136 mg/dL — ABNORMAL HIGH (ref 70–99)
Glucose-Capillary: 169 mg/dL — ABNORMAL HIGH (ref 70–99)

## 2023-08-23 SURGERY — AMPUTATION, TOE
Anesthesia: General | Site: Toe | Laterality: Left

## 2023-08-23 MED ORDER — METOCLOPRAMIDE HCL 5 MG/ML IJ SOLN: 5.0000 mg | Freq: Three times a day (TID) | INTRAMUSCULAR | Status: DC | PRN

## 2023-08-23 MED ORDER — FENTANYL CITRATE (PF) 100 MCG/2ML IJ SOLN: 25.0000 ug | INTRAMUSCULAR | Status: DC | PRN

## 2023-08-23 MED ORDER — PROPOFOL 10 MG/ML IV BOLUS
INTRAVENOUS | Status: DC | PRN
  Administered 2023-08-23: 50 mg via INTRAVENOUS
  Administered 2023-08-23: 10 mg via INTRAVENOUS

## 2023-08-23 MED ORDER — PROPOFOL 500 MG/50ML IV EMUL
INTRAVENOUS | Status: DC | PRN
  Administered 2023-08-23: 145 ug/kg/min via INTRAVENOUS

## 2023-08-23 MED ORDER — OXYCODONE HCL 5 MG PO TABS
ORAL_TABLET | ORAL | Status: AC
  Filled 2023-08-23: qty 1

## 2023-08-23 MED ORDER — KETAMINE HCL 10 MG/ML IJ SOLN
INTRAMUSCULAR | Status: DC | PRN
  Administered 2023-08-23: 10 mg via INTRAVENOUS
  Administered 2023-08-23: 20 mg via INTRAVENOUS
  Administered 2023-08-23 (×2): 10 mg via INTRAVENOUS

## 2023-08-23 MED ORDER — LEVOFLOXACIN IN D5W 500 MG/100ML IV SOLN
INTRAVENOUS | Status: AC
  Filled 2023-08-23: qty 100

## 2023-08-23 MED ORDER — ONDANSETRON HCL 4 MG/2ML IJ SOLN: 4.0000 mg | Freq: Once | INTRAMUSCULAR | Status: DC | PRN

## 2023-08-23 MED ORDER — OXYCODONE HCL 5 MG PO TABS
5.0000 mg | ORAL_TABLET | Freq: Once | ORAL | Status: AC | PRN
  Administered 2023-08-23: 5 mg via ORAL

## 2023-08-23 MED ORDER — OXYCODONE-ACETAMINOPHEN 5-325 MG PO TABS: 1.0000 | ORAL_TABLET | Freq: Four times a day (QID) | ORAL | 0 refills | Status: DC | PRN

## 2023-08-23 MED ORDER — ONDANSETRON HCL 4 MG/2ML IJ SOLN
4.0000 mg | Freq: Four times a day (QID) | INTRAMUSCULAR | Status: DC | PRN
Start: 2023-08-23 — End: 2023-08-23

## 2023-08-23 MED ORDER — CHLORHEXIDINE GLUCONATE 0.12 % MT SOLN
OROMUCOSAL | Status: AC
  Filled 2023-08-23: qty 15

## 2023-08-23 MED ORDER — OXYCODONE HCL 5 MG/5ML PO SOLN: 5.0000 mg | Freq: Once | ORAL | Status: AC | PRN

## 2023-08-23 MED ORDER — ONDANSETRON HCL 4 MG/2ML IJ SOLN
INTRAMUSCULAR | Status: DC | PRN
  Administered 2023-08-23: 4 mg via INTRAVENOUS

## 2023-08-23 MED ORDER — BUPIVACAINE HCL 0.5 % IJ SOLN
INTRAMUSCULAR | Status: DC | PRN
  Administered 2023-08-23: 10 mL

## 2023-08-23 MED ORDER — LEVOFLOXACIN IN D5W 500 MG/100ML IV SOLN
500.0000 mg | INTRAVENOUS | Status: AC
  Administered 2023-08-23: 500 mg via INTRAVENOUS

## 2023-08-23 MED ORDER — SODIUM CHLORIDE 0.9 % IV SOLN: INTRAVENOUS | Status: DC

## 2023-08-23 MED ORDER — ONDANSETRON HCL 4 MG PO TABS: 4.0000 mg | ORAL_TABLET | Freq: Four times a day (QID) | ORAL | Status: DC | PRN

## 2023-08-23 MED ORDER — PHENYLEPHRINE 80 MCG/ML (10ML) SYRINGE FOR IV PUSH (FOR BLOOD PRESSURE SUPPORT)
PREFILLED_SYRINGE | INTRAVENOUS | Status: DC | PRN
  Administered 2023-08-23 (×2): 160 ug via INTRAVENOUS
  Administered 2023-08-23: 80 ug via INTRAVENOUS

## 2023-08-23 MED ORDER — VANCOMYCIN HCL IN DEXTROSE 1-5 GM/200ML-% IV SOLN
1000.0000 mg | INTRAVENOUS | Status: AC
  Administered 2023-08-23: 1000 mg via INTRAVENOUS

## 2023-08-23 MED ORDER — VANCOMYCIN HCL IN DEXTROSE 1-5 GM/200ML-% IV SOLN
INTRAVENOUS | Status: AC
  Filled 2023-08-23: qty 200

## 2023-08-23 MED ORDER — ORAL CARE MOUTH RINSE: 15.0000 mL | Freq: Once | OROMUCOSAL | Status: AC

## 2023-08-23 MED ORDER — 0.9 % SODIUM CHLORIDE (POUR BTL) OPTIME
TOPICAL | Status: DC | PRN
  Administered 2023-08-23: 500 mL

## 2023-08-23 MED ORDER — KETAMINE HCL 50 MG/5ML IJ SOSY
PREFILLED_SYRINGE | INTRAMUSCULAR | Status: AC
  Filled 2023-08-23: qty 5

## 2023-08-23 MED ORDER — LIDOCAINE HCL (PF) 1 % IJ SOLN
INTRAMUSCULAR | Status: AC
  Filled 2023-08-23: qty 30

## 2023-08-23 MED ORDER — PROPOFOL 1000 MG/100ML IV EMUL
INTRAVENOUS | Status: AC
  Filled 2023-08-23: qty 100

## 2023-08-23 MED ORDER — CHLORHEXIDINE GLUCONATE 0.12 % MT SOLN
15.0000 mL | Freq: Once | OROMUCOSAL | Status: AC
  Administered 2023-08-23: 15 mL via OROMUCOSAL

## 2023-08-23 MED ORDER — METOCLOPRAMIDE HCL 10 MG PO TABS
5.0000 mg | ORAL_TABLET | Freq: Three times a day (TID) | ORAL | Status: DC | PRN
Start: 2023-08-23 — End: 2023-08-23

## 2023-08-23 MED ORDER — MIDAZOLAM HCL 2 MG/2ML IJ SOLN
INTRAMUSCULAR | Status: AC
Start: 2023-08-23 — End: ?
  Filled 2023-08-23: qty 2

## 2023-08-23 MED ORDER — MIDAZOLAM HCL 2 MG/2ML IJ SOLN
INTRAMUSCULAR | Status: DC | PRN
  Administered 2023-08-23: 2 mg via INTRAVENOUS

## 2023-08-23 MED ORDER — BUPIVACAINE HCL (PF) 0.5 % IJ SOLN
INTRAMUSCULAR | Status: AC
  Filled 2023-08-23: qty 30

## 2023-08-23 SURGICAL SUPPLY — 44 items
BLADE OSC/SAGITTAL MD 5.5X18 (BLADE) ×1 IMPLANT
BLADE SURG MINI STRL (BLADE) ×1 IMPLANT
BNDG ELASTIC 4X5.8 VLCR NS LF (GAUZE/BANDAGES/DRESSINGS) ×1 IMPLANT
BNDG ESMARCH 4X12 STRL LF (GAUZE/BANDAGES/DRESSINGS) ×1 IMPLANT
BNDG GAUZE DERMACEA FLUFF 4 (GAUZE/BANDAGES/DRESSINGS) ×1 IMPLANT
BNDG STRETCH GAUZE 3IN X12FT (GAUZE/BANDAGES/DRESSINGS) ×2 IMPLANT
CUFF TOURN SGL QUICK 12 (TOURNIQUET CUFF) IMPLANT
CUFF TOURN SGL QUICK 18X4 (TOURNIQUET CUFF) IMPLANT
DRAPE FLUOR MINI C-ARM 54X84 (DRAPES) ×1 IMPLANT
DRAPE XRAY CASSETTE 23X24 (DRAPES) ×1 IMPLANT
DURAPREP 26ML APPLICATOR (WOUND CARE) ×1 IMPLANT
ELECT REM PT RETURN 9FT ADLT (ELECTROSURGICAL) ×1 IMPLANT
ELECTRODE REM PT RTRN 9FT ADLT (ELECTROSURGICAL) ×1 IMPLANT
GAUZE PACKING IODOFORM 1/2INX (GAUZE/BANDAGES/DRESSINGS) ×1 IMPLANT
GAUZE SPONGE 4X4 12PLY STRL (GAUZE/BANDAGES/DRESSINGS) ×1 IMPLANT
GAUZE STRETCH 2X75IN STRL (MISCELLANEOUS) ×1 IMPLANT
GAUZE XEROFORM 1X8 LF (GAUZE/BANDAGES/DRESSINGS) ×1 IMPLANT
GLOVE BIO SURGEON STRL SZ7.5 (GLOVE) ×1 IMPLANT
GLOVE INDICATOR 8.0 STRL GRN (GLOVE) ×1 IMPLANT
GOWN STRL REUS W/ TWL XL LVL3 (GOWN DISPOSABLE) ×1 IMPLANT
GOWN STRL REUS W/TWL XL LVL4 (GOWN DISPOSABLE) ×1 IMPLANT
IV NS IRRIG 3000ML ARTHROMATIC (IV SOLUTION) ×1 IMPLANT
KIT TURNOVER KIT A (KITS) ×1 IMPLANT
LABEL OR SOLS (LABEL) ×1 IMPLANT
MANIFOLD NEPTUNE II (INSTRUMENTS) ×1 IMPLANT
NDL FILTER BLUNT 18X1 1/2 (NEEDLE) ×1 IMPLANT
NDL HYPO 25X1 1.5 SAFETY (NEEDLE) ×1 IMPLANT
NEEDLE FILTER BLUNT 18X1 1/2 (NEEDLE) ×1 IMPLANT
NEEDLE HYPO 25X1 1.5 SAFETY (NEEDLE) ×1 IMPLANT
NS IRRIG 500ML POUR BTL (IV SOLUTION) ×1 IMPLANT
PACK EXTREMITY ARMC (MISCELLANEOUS) ×1 IMPLANT
PAD ABD DERMACEA PRESS 5X9 (GAUZE/BANDAGES/DRESSINGS) ×2 IMPLANT
PULSAVAC PLUS IRRIG FAN TIP (DISPOSABLE) IMPLANT
SHIELD FULL FACE ANTIFOG 7M (MISCELLANEOUS) ×1 IMPLANT
STOCKINETTE M/LG 89821 (MISCELLANEOUS) ×1 IMPLANT
STRAP SAFETY 5IN WIDE (MISCELLANEOUS) ×1 IMPLANT
SUT ETHILON 3-0 FS-10 30 BLK (SUTURE) IMPLANT
SUT ETHILON 5-0 FS-2 18 BLK (SUTURE) ×1 IMPLANT
SUT VIC AB 4-0 FS2 27 (SUTURE) ×1 IMPLANT
SUTURE EHLN 3-0 FS-10 30 BLK (SUTURE) ×1 IMPLANT
SYR 10ML LL (SYRINGE) ×3 IMPLANT
TIP FAN IRRIG PULSAVAC PLUS (DISPOSABLE) ×1 IMPLANT
TRAP FLUID SMOKE EVACUATOR (MISCELLANEOUS) ×1 IMPLANT
WATER STERILE IRR 500ML POUR (IV SOLUTION) ×1 IMPLANT

## 2023-08-23 NOTE — H&P (Signed)
 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  The various methods of treatment have been discussed with the patient.  No guarantees were given.  After consideration of risks, benefits and other options for treatment, the patient has consented to surgery.  I have reviewed the patients' chart and labs.     A history and physical examination was performed in my office.  The patient was reexamined.  There have been no changes to this history and physical examination.  Donna Fisher A

## 2023-08-23 NOTE — Anesthesia Postprocedure Evaluation (Signed)
 Anesthesia Post Note  Patient: Donna Fisher  Procedure(s) Performed: 71174 - AMPUTATION TOE INTERPHALANGEAL JOINT (Left: Toe)  Patient location during evaluation: PACU Anesthesia Type: General Level of consciousness: awake and alert Pain management: pain level controlled Vital Signs Assessment: post-procedure vital signs reviewed and stable Respiratory status: spontaneous breathing, nonlabored ventilation, respiratory function stable and patient connected to nasal cannula oxygen Cardiovascular status: blood pressure returned to baseline and stable Postop Assessment: no apparent nausea or vomiting Anesthetic complications: no   No notable events documented.   Last Vitals:  Vitals:   08/23/23 1345 08/23/23 1405  BP: 108/64 106/62  Pulse: 94 96  Resp: 14 16  Temp: 36.6 C 36.6 C  SpO2:  98%    Last Pain:  Vitals:   08/23/23 1405  TempSrc: Temporal  PainSc: 3                  [name]

## 2023-08-23 NOTE — Transfer of Care (Signed)
 Immediate Anesthesia Transfer of Care Note  Patient: Donna Fisher  Procedure(s) Performed: 71174 - AMPUTATION TOE INTERPHALANGEAL JOINT (Left: Toe)  Patient Location: PACU  Anesthesia Type:General  Level of Consciousness: drowsy and patient cooperative  Airway & Oxygen Therapy: Patient Spontanous Breathing and Patient connected to face mask oxygen  Post-op Assessment: Report given to RN and Post -op Vital signs reviewed and stable  Post vital signs: Reviewed and stable  Last Vitals:  Vitals Value Taken Time  BP 102/62 08/23/23 1306  Temp 36.1 C 08/23/23 1306  Pulse 91 08/23/23 1306  Resp 15 08/23/23 1306  SpO2 100 % 08/23/23 1306    Last Pain:  Vitals:   08/23/23 1306  TempSrc:   PainSc: Asleep      Patients Stated Pain Goal: 0 (08/23/23 1127)  Complications: No notable events documented.

## 2023-08-23 NOTE — Anesthesia Preprocedure Evaluation (Signed)
 Anesthesia Evaluation  Patient identified by MRN, date of birth, ID band Patient awake  General Assessment Comment:  Hx of aspiration perioperatively in 2024 in the setting of not holding her Ozempic (taken 3 days prior to surgery), hospitalized for 5 days.  Reviewed: Allergy & Precautions, NPO status , Patient's Chart, lab work & pertinent test results  History of Anesthesia Complications (+) PONV and history of anesthetic complications  Airway Mallampati: II  TM Distance: >3 FB Neck ROM: Full    Dental  (+) Missing, Chipped Only two bottom teeth left, none loose:   Pulmonary neg sleep apnea, neg COPD, Current Smoker and Patient abstained from smoking.   Pulmonary exam normal breath sounds clear to auscultation       Cardiovascular Exercise Tolerance: Good METS(-) hypertension+ Peripheral Vascular Disease  (-) CAD and (-) Past MI negative cardio ROS (-) dysrhythmias  Rhythm:Regular Rate:Normal - Systolic murmurs TTE 2024 relatively unremarkable   Neuro/Psych  Headaches PSYCHIATRIC DISORDERS Anxiety     CVA, No Residual Symptoms    GI/Hepatic ,neg GERD  ,,(+)     (-) substance abuse    Endo/Other  diabetes, Insulin  Dependent  GLP1RA last taken 9 days ago. Patient had done a self-directed liquid diet yesterday. Denies GI symptoms. Patient with hx of gastroparesis  Renal/GU CRFRenal disease     Musculoskeletal   Abdominal   Peds  Hematology   Anesthesia Other Findings Past Medical History: No date: Anxiety state, unspecified No date: CKD (chronic kidney disease), stage III (HCC) No date: Diabetic neuropathy (HCC) No date: Genital herpes, unspecified No date: Headache(784.0) No date: History of kidney stones 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 No date: Neuropathy No date: Other and unspecified hyperlipidemia No date: Pain in joint, pelvic region and thigh No date: Pneumonia No date: PONV (postoperative nausea and  vomiting) No date: Stroke [facility])     Comment:  mini stroke 2015 No date: 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 of complication, uncontrolled  Reproductive/Obstetrics                             Anesthesia Physical Anesthesia Plan  ASA: 3  Anesthesia Plan: General   Post-op Pain Management: Minimal or no pain anticipated and Ofirmev  IV (intra-op)*   Induction: Intravenous  PONV Risk Score and Plan: 4 or greater and Propofol  infusion, TIVA, Ondansetron , Midazolam  and Treatment may vary due to age or medical condition  Airway Management Planned: Nasal Cannula  Additional Equipment: None  Intra-op Plan:   Post-operative Plan:   Informed Consent: I have reviewed the patients History and Physical, chart, labs and discussed the procedure including the risks, benefits and alternatives for the proposed anesthesia with the patient or authorized representative who has indicated his/her understanding and acceptance.     Dental advisory given  Plan Discussed with: CRNA and Surgeon  Anesthesia Plan Comments: (Given appropriately holding GLP1RA, doing a liquid diet yesterday, and no GI symptoms today, it is reasonable to proceed with the usual natural airway anesthetic for this procedure. Discussed risks of anesthesia with patient, including possibility of difficulty with spontaneous ventilation under anesthesia necessitating airway intervention, PONV, aspiration, and rare risks such as cardiac or respiratory or neurological events, and allergic reactions. Discussed the role of CRNA in patient's perioperative care. Patient understands.)       Anesthesia Quick Evaluation

## 2023-08-23 NOTE — Discharge Instructions (Signed)

## 2023-08-23 NOTE — Anesthesia Procedure Notes (Signed)
 Procedure Name: General with mask airway Date/Time: 08/23/2023 12:31 PM  Performed by: Ledora Duncan, CRNAPre-anesthesia Checklist: Patient identified, Emergency Drugs available, Suction available and Patient being monitored Patient Re-evaluated:Patient Re-evaluated prior to induction Oxygen Delivery Method: Simple face mask Induction Type: IV induction Placement Confirmation: positive ETCO2 and breath sounds checked- equal and bilateral Dental Injury: Teeth and Oropharynx as per pre-operative assessment

## 2023-08-23 NOTE — Op Note (Signed)
 Operative note   Surgeon:Sheritta Deeg [employer]/support/administrative Officer: None    Preop diagnosis: Gangrene left third toe    Postop diagnosis: Same    Procedure: Partial amputation left third toe    EBL: Minimal    Anesthesia:local and IV sedation.  Local consists of a total of 5 cc of 0.5% bupivacaine  plain in a local block fashion    Hemostasis: None    Specimen: Gangrenous left third toe for pathology    Complications: None    Operative indications:Donna Fisher is an 58 y.o. that presents today for surgical intervention.  The risks/benefits/alternatives/complications have been discussed and consent has been given.    Procedure:  Patient was brought into the OR and placed on the operating table in thesupine position. After anesthesia was obtained theleft lower extremity was prepped and draped in usual sterile fashion.  Attention was directed to the left third toe where gangrenous changes were noted to the distal aspect.  2 semielliptical incisions were performed medial and lateral full-thickness down to the bone.  A sagittal saw was then used to remove the distal portion of the toe.  This was taken through the proximal phalanx.  The wound was flushed with copious amounts of irrigation.  Closure was then performed with a 3-0 nylon.  Bulky sterile dressing was then applied.    Patient tolerated the procedure and anesthesia well.  Was transported from the OR to the PACU with all vital signs stable and vascular status intact. To be discharged per routine protocol.  Will follow up in approximately 1 week in the outpatient clinic.

## 2023-08-24 ENCOUNTER — Encounter: Payer: Self-pay | Admitting: Podiatry

## 2023-08-25 LAB — SURGICAL PATHOLOGY

## 2023-09-14 ENCOUNTER — Other Ambulatory Visit: Payer: Self-pay | Admitting: Family Medicine

## 2023-09-14 DIAGNOSIS — Z1231 Encounter for screening mammogram for malignant neoplasm of breast: Secondary | ICD-10-CM

## 2023-09-29 ENCOUNTER — Telehealth: Payer: Self-pay

## 2023-09-29 DIAGNOSIS — N3946 Mixed incontinence: Secondary | ICD-10-CM

## 2023-09-29 MED ORDER — TOLTERODINE TARTRATE ER 4 MG PO CP24: 4.0000 mg | ORAL_CAPSULE | Freq: Every day | ORAL | 0 refills | Status: AC

## 2023-09-29 NOTE — Telephone Encounter (Signed)
 Patient states she has scheduled appointment for 10/19/23. Requesting refill of tolterodine (DETROL LA) 4 MG 24 hr capsule . Advised will send 30 day supply.

## 2023-10-04 ENCOUNTER — Encounter: Payer: Self-pay | Admitting: *Deleted

## 2023-10-04 ENCOUNTER — Other Ambulatory Visit: Payer: Self-pay

## 2023-10-04 DIAGNOSIS — F419 Anxiety disorder, unspecified: Secondary | ICD-10-CM | POA: Diagnosis present

## 2023-10-04 DIAGNOSIS — M86672 Other chronic osteomyelitis, left ankle and foot: Secondary | ICD-10-CM | POA: Diagnosis present

## 2023-10-04 DIAGNOSIS — E785 Hyperlipidemia, unspecified: Secondary | ICD-10-CM | POA: Diagnosis present

## 2023-10-04 DIAGNOSIS — Z7982 Long term (current) use of aspirin: Secondary | ICD-10-CM

## 2023-10-04 DIAGNOSIS — Z89432 Acquired absence of left foot: Secondary | ICD-10-CM

## 2023-10-04 DIAGNOSIS — E1152 Type 2 diabetes mellitus with diabetic peripheral angiopathy with gangrene: Secondary | ICD-10-CM | POA: Diagnosis present

## 2023-10-04 DIAGNOSIS — F17210 Nicotine dependence, cigarettes, uncomplicated: Secondary | ICD-10-CM | POA: Diagnosis present

## 2023-10-04 DIAGNOSIS — M86662 Other chronic osteomyelitis, left tibia and fibula: Secondary | ICD-10-CM | POA: Diagnosis present

## 2023-10-04 DIAGNOSIS — Z89421 Acquired absence of other right toe(s): Secondary | ICD-10-CM

## 2023-10-04 DIAGNOSIS — Z833 Family history of diabetes mellitus: Secondary | ICD-10-CM

## 2023-10-04 DIAGNOSIS — Z803 Family history of malignant neoplasm of breast: Secondary | ICD-10-CM

## 2023-10-04 DIAGNOSIS — Z794 Long term (current) use of insulin: Secondary | ICD-10-CM

## 2023-10-04 DIAGNOSIS — Z8249 Family history of ischemic heart disease and other diseases of the circulatory system: Secondary | ICD-10-CM

## 2023-10-04 DIAGNOSIS — T84623A Infection and inflammatory reaction due to internal fixation device of left tibia, initial encounter: Principal | ICD-10-CM | POA: Diagnosis present

## 2023-10-04 DIAGNOSIS — Y831 Surgical operation with implant of artificial internal device as the cause of abnormal reaction of the patient, or of later complication, without mention of misadventure at the time of the procedure: Secondary | ICD-10-CM | POA: Diagnosis present

## 2023-10-04 DIAGNOSIS — Z885 Allergy status to narcotic agent status: Secondary | ICD-10-CM

## 2023-10-04 DIAGNOSIS — E1142 Type 2 diabetes mellitus with diabetic polyneuropathy: Secondary | ICD-10-CM | POA: Diagnosis present

## 2023-10-04 DIAGNOSIS — Z809 Family history of malignant neoplasm, unspecified: Secondary | ICD-10-CM

## 2023-10-04 DIAGNOSIS — Z89411 Acquired absence of right great toe: Secondary | ICD-10-CM

## 2023-10-04 DIAGNOSIS — Z881 Allergy status to other antibiotic agents status: Secondary | ICD-10-CM

## 2023-10-04 DIAGNOSIS — A480 Gas gangrene: Secondary | ICD-10-CM | POA: Diagnosis present

## 2023-10-04 DIAGNOSIS — E1169 Type 2 diabetes mellitus with other specified complication: Secondary | ICD-10-CM | POA: Diagnosis present

## 2023-10-04 DIAGNOSIS — L97529 Non-pressure chronic ulcer of other part of left foot with unspecified severity: Secondary | ICD-10-CM | POA: Diagnosis present

## 2023-10-04 DIAGNOSIS — Z79899 Other long term (current) drug therapy: Secondary | ICD-10-CM

## 2023-10-04 DIAGNOSIS — D62 Acute posthemorrhagic anemia: Secondary | ICD-10-CM | POA: Diagnosis not present

## 2023-10-04 DIAGNOSIS — E1122 Type 2 diabetes mellitus with diabetic chronic kidney disease: Secondary | ICD-10-CM | POA: Diagnosis present

## 2023-10-04 DIAGNOSIS — Z888 Allergy status to other drugs, medicaments and biological substances status: Secondary | ICD-10-CM

## 2023-10-04 DIAGNOSIS — E11621 Type 2 diabetes mellitus with foot ulcer: Secondary | ICD-10-CM | POA: Diagnosis present

## 2023-10-04 DIAGNOSIS — I959 Hypotension, unspecified: Secondary | ICD-10-CM | POA: Diagnosis not present

## 2023-10-04 DIAGNOSIS — Z7985 Long-term (current) use of injectable non-insulin antidiabetic drugs: Secondary | ICD-10-CM

## 2023-10-04 DIAGNOSIS — Z7984 Long term (current) use of oral hypoglycemic drugs: Secondary | ICD-10-CM

## 2023-10-04 DIAGNOSIS — Z8673 Personal history of transient ischemic attack (TIA), and cerebral infarction without residual deficits: Secondary | ICD-10-CM

## 2023-10-04 DIAGNOSIS — E1165 Type 2 diabetes mellitus with hyperglycemia: Secondary | ICD-10-CM | POA: Diagnosis present

## 2023-10-04 DIAGNOSIS — Z880 Allergy status to penicillin: Secondary | ICD-10-CM

## 2023-10-04 DIAGNOSIS — N1832 Chronic kidney disease, stage 3b: Secondary | ICD-10-CM | POA: Diagnosis present

## 2023-10-04 DIAGNOSIS — E118 Type 2 diabetes mellitus with unspecified complications: Secondary | ICD-10-CM | POA: Diagnosis not present

## 2023-10-04 DIAGNOSIS — N179 Acute kidney failure, unspecified: Secondary | ICD-10-CM | POA: Diagnosis present

## 2023-10-04 DIAGNOSIS — Z95828 Presence of other vascular implants and grafts: Secondary | ICD-10-CM

## 2023-10-04 LAB — CBC
HCT: 35.7 % — ABNORMAL LOW (ref 36.0–46.0)
Hemoglobin: 11.3 g/dL — ABNORMAL LOW (ref 12.0–15.0)
MCH: 29.1 pg (ref 26.0–34.0)
MCHC: 31.7 g/dL (ref 30.0–36.0)
MCV: 92 fL (ref 80.0–100.0)
Platelets: 356 10*3/uL (ref 150–400)
RBC: 3.88 MIL/uL (ref 3.87–5.11)
RDW: 13.1 % (ref 11.5–15.5)
WBC: 5.5 10*3/uL (ref 4.0–10.5)
nRBC: 0 % (ref 0.0–0.2)

## 2023-10-04 LAB — BASIC METABOLIC PANEL
Anion gap: 8 (ref 5–15)
BUN: 32 mg/dL — ABNORMAL HIGH (ref 6–20)
CO2: 25 mmol/L (ref 22–32)
Calcium: 8.7 mg/dL — ABNORMAL LOW (ref 8.9–10.3)
Chloride: 104 mmol/L (ref 98–111)
Creatinine, Ser: 2.04 mg/dL — ABNORMAL HIGH (ref 0.44–1.00)
GFR, Estimated: 28 mL/min — ABNORMAL LOW (ref >60)
Glucose, Bld: 142 mg/dL — ABNORMAL HIGH (ref 70–99)
Potassium: 4.3 mmol/L (ref 3.5–5.1)
Sodium: 137 mmol/L (ref 135–145)

## 2023-10-04 LAB — LACTIC ACID, PLASMA: Lactic Acid, Venous: 0.9 mmol/L (ref 0.5–1.9)

## 2023-10-04 NOTE — ED Triage Notes (Signed)
 Pt to triage via wheelchair.  Pt reports xray done today by dr Ether Griffins.  Pt told to come to er for eval for infection and gas on xray.   Pt reports ulcer on bottom of left foot for 2 weeks.  Hx diabetes.

## 2023-10-05 ENCOUNTER — Encounter
Admission: EM | Disposition: A | Discharge status: Home Health | Payer: Self-pay | Source: Home / Self Care | Attending: Internal Medicine

## 2023-10-05 ENCOUNTER — Inpatient Hospital Stay: Admitting: Certified Registered Nurse Anesthetist

## 2023-10-05 ENCOUNTER — Emergency Department

## 2023-10-05 ENCOUNTER — Inpatient Hospital Stay
Admission: EM | Admit: 2023-10-05 | Discharge: 2023-10-14 | DRG: 474 | Disposition: A | Discharge status: Home Health | Attending: Hospitalist | Admitting: Hospitalist

## 2023-10-05 ENCOUNTER — Other Ambulatory Visit: Payer: Self-pay

## 2023-10-05 ENCOUNTER — Inpatient Hospital Stay

## 2023-10-05 DIAGNOSIS — N179 Acute kidney failure, unspecified: Secondary | ICD-10-CM

## 2023-10-05 DIAGNOSIS — Y831 Surgical operation with implant of artificial internal device as the cause of abnormal reaction of the patient, or of later complication, without mention of misadventure at the time of the procedure: Secondary | ICD-10-CM | POA: Diagnosis present

## 2023-10-05 DIAGNOSIS — E1169 Type 2 diabetes mellitus with other specified complication: Secondary | ICD-10-CM | POA: Diagnosis present

## 2023-10-05 DIAGNOSIS — E118 Type 2 diabetes mellitus with unspecified complications: Secondary | ICD-10-CM | POA: Diagnosis present

## 2023-10-05 DIAGNOSIS — N1832 Chronic kidney disease, stage 3b: Secondary | ICD-10-CM | POA: Diagnosis present

## 2023-10-05 DIAGNOSIS — I959 Hypotension, unspecified: Secondary | ICD-10-CM | POA: Diagnosis not present

## 2023-10-05 DIAGNOSIS — E1152 Type 2 diabetes mellitus with diabetic peripheral angiopathy with gangrene: Secondary | ICD-10-CM | POA: Diagnosis present

## 2023-10-05 DIAGNOSIS — L97529 Non-pressure chronic ulcer of other part of left foot with unspecified severity: Secondary | ICD-10-CM | POA: Diagnosis present

## 2023-10-05 DIAGNOSIS — Z95828 Presence of other vascular implants and grafts: Secondary | ICD-10-CM | POA: Diagnosis not present

## 2023-10-05 DIAGNOSIS — E1122 Type 2 diabetes mellitus with diabetic chronic kidney disease: Secondary | ICD-10-CM | POA: Diagnosis present

## 2023-10-05 DIAGNOSIS — Z7984 Long term (current) use of oral hypoglycemic drugs: Secondary | ICD-10-CM | POA: Diagnosis not present

## 2023-10-05 DIAGNOSIS — D62 Acute posthemorrhagic anemia: Secondary | ICD-10-CM | POA: Diagnosis not present

## 2023-10-05 DIAGNOSIS — Z89432 Acquired absence of left foot: Secondary | ICD-10-CM | POA: Diagnosis not present

## 2023-10-05 DIAGNOSIS — Z8249 Family history of ischemic heart disease and other diseases of the circulatory system: Secondary | ICD-10-CM | POA: Diagnosis not present

## 2023-10-05 DIAGNOSIS — T84623A Infection and inflammatory reaction due to internal fixation device of left tibia, initial encounter: Secondary | ICD-10-CM | POA: Diagnosis present

## 2023-10-05 DIAGNOSIS — F419 Anxiety disorder, unspecified: Secondary | ICD-10-CM | POA: Diagnosis present

## 2023-10-05 DIAGNOSIS — E11621 Type 2 diabetes mellitus with foot ulcer: Principal | ICD-10-CM

## 2023-10-05 DIAGNOSIS — A480 Gas gangrene: Secondary | ICD-10-CM

## 2023-10-05 DIAGNOSIS — M86672 Other chronic osteomyelitis, left ankle and foot: Secondary | ICD-10-CM | POA: Diagnosis present

## 2023-10-05 DIAGNOSIS — M869 Osteomyelitis, unspecified: Secondary | ICD-10-CM | POA: Diagnosis not present

## 2023-10-05 DIAGNOSIS — Z7982 Long term (current) use of aspirin: Secondary | ICD-10-CM | POA: Diagnosis not present

## 2023-10-05 DIAGNOSIS — E1165 Type 2 diabetes mellitus with hyperglycemia: Secondary | ICD-10-CM | POA: Diagnosis present

## 2023-10-05 DIAGNOSIS — E785 Hyperlipidemia, unspecified: Secondary | ICD-10-CM | POA: Diagnosis present

## 2023-10-05 DIAGNOSIS — Z794 Long term (current) use of insulin: Secondary | ICD-10-CM | POA: Diagnosis not present

## 2023-10-05 DIAGNOSIS — M86662 Other chronic osteomyelitis, left tibia and fibula: Secondary | ICD-10-CM | POA: Diagnosis present

## 2023-10-05 DIAGNOSIS — Z7985 Long-term (current) use of injectable non-insulin antidiabetic drugs: Secondary | ICD-10-CM | POA: Diagnosis not present

## 2023-10-05 DIAGNOSIS — A419 Sepsis, unspecified organism: Secondary | ICD-10-CM | POA: Diagnosis not present

## 2023-10-05 DIAGNOSIS — E1142 Type 2 diabetes mellitus with diabetic polyneuropathy: Secondary | ICD-10-CM | POA: Diagnosis present

## 2023-10-05 HISTORY — PX: HARDWARE REMOVAL: SHX979

## 2023-10-05 HISTORY — PX: AMPUTATION: SHX166

## 2023-10-05 LAB — GLUCOSE, CAPILLARY
Glucose-Capillary: 100 mg/dL — ABNORMAL HIGH (ref 70–99)
Glucose-Capillary: 107 mg/dL — ABNORMAL HIGH (ref 70–99)
Glucose-Capillary: 93 mg/dL (ref 70–99)
Glucose-Capillary: 117 mg/dL — ABNORMAL HIGH (ref 70–99)
Glucose-Capillary: 200 mg/dL — ABNORMAL HIGH (ref 70–99)

## 2023-10-05 LAB — CBG MONITORING, ED: Glucose-Capillary: 104 mg/dL — ABNORMAL HIGH (ref 70–99)

## 2023-10-05 LAB — TYPE AND SCREEN
ABO/RH(D): A POS
Antibody Screen: NEGATIVE

## 2023-10-05 LAB — HEMOGLOBIN A1C
Hgb A1c MFr Bld: 7.3 % — ABNORMAL HIGH (ref 4.8–5.6)
Mean Plasma Glucose: 162.81 mg/dL

## 2023-10-05 SURGERY — AMPUTATION BELOW KNEE
Anesthesia: General | Site: Leg Lower | Laterality: Left

## 2023-10-05 MED ORDER — HEPARIN SODIUM (PORCINE) 5000 UNIT/ML IJ SOLN
5000.0000 [IU] | Freq: Three times a day (TID) | INTRAMUSCULAR | Status: DC
  Administered 2023-10-05 – 2023-10-06 (×3): 5000 [IU] via SUBCUTANEOUS
  Filled 2023-10-05 (×4): qty 1

## 2023-10-05 MED ORDER — FLUMAZENIL 0.5 MG/5ML IV SOLN
INTRAVENOUS | Status: AC
  Filled 2023-10-05: qty 5

## 2023-10-05 MED ORDER — DEXAMETHASONE SODIUM PHOSPHATE 10 MG/ML IJ SOLN
INTRAMUSCULAR | Status: AC
  Filled 2023-10-05: qty 1

## 2023-10-05 MED ORDER — ROCURONIUM BROMIDE 10 MG/ML (PF) SYRINGE
PREFILLED_SYRINGE | INTRAVENOUS | Status: AC
  Filled 2023-10-05: qty 10

## 2023-10-05 MED ORDER — LIDOCAINE HCL (CARDIAC) PF 100 MG/5ML IV SOSY
PREFILLED_SYRINGE | INTRAVENOUS | Status: DC | PRN
  Administered 2023-10-05: 100 mg via INTRAVENOUS

## 2023-10-05 MED ORDER — ONDANSETRON HCL 4 MG/2ML IJ SOLN
INTRAMUSCULAR | Status: AC
  Filled 2023-10-05: qty 2

## 2023-10-05 MED ORDER — ACETAMINOPHEN 10 MG/ML IV SOLN
INTRAVENOUS | Status: AC
  Filled 2023-10-05: qty 100

## 2023-10-05 MED ORDER — VANCOMYCIN HCL 750 MG/150ML IV SOLN
750.0000 mg | INTRAVENOUS | Status: DC
  Administered 2023-10-06 – 2023-10-07 (×2): 750 mg via INTRAVENOUS
  Filled 2023-10-05 (×3): qty 150

## 2023-10-05 MED ORDER — INSULIN ASPART 100 UNIT/ML IJ SOLN
0.0000 [IU] | Freq: Three times a day (TID) | INTRAMUSCULAR | Status: DC
  Administered 2023-10-06: 5 [IU] via SUBCUTANEOUS
  Administered 2023-10-06 – 2023-10-07 (×3): 3 [IU] via SUBCUTANEOUS
  Administered 2023-10-07: 2 [IU] via SUBCUTANEOUS
  Administered 2023-10-08: 3 [IU] via SUBCUTANEOUS
  Administered 2023-10-08: 2 [IU] via SUBCUTANEOUS
  Administered 2023-10-09 (×2): 3 [IU] via SUBCUTANEOUS
  Administered 2023-10-10: 2 [IU] via SUBCUTANEOUS
  Administered 2023-10-11: 3 [IU] via SUBCUTANEOUS
  Administered 2023-10-11 – 2023-10-13 (×3): 2 [IU] via SUBCUTANEOUS
  Administered 2023-10-13: 3 [IU] via SUBCUTANEOUS
  Administered 2023-10-13: 2 [IU] via SUBCUTANEOUS
  Filled 2023-10-05 (×17): qty 1

## 2023-10-05 MED ORDER — OXYCODONE HCL 5 MG PO TABS: 5.0000 mg | ORAL_TABLET | Freq: Once | ORAL | Status: DC | PRN

## 2023-10-05 MED ORDER — SODIUM CHLORIDE 0.9 % IV SOLN: INTRAVENOUS | Status: DC

## 2023-10-05 MED ORDER — VANCOMYCIN HCL 1000 MG IV SOLR
INTRAVENOUS | Status: AC
  Filled 2023-10-05: qty 20

## 2023-10-05 MED ORDER — SODIUM CHLORIDE 0.9 % IV SOLN
2.0000 g | Freq: Every day | INTRAVENOUS | Status: DC
  Filled 2023-10-05: qty 12.5

## 2023-10-05 MED ORDER — INSULIN ASPART 100 UNIT/ML IJ SOLN: 0.0000 [IU] | Freq: Every day | INTRAMUSCULAR | Status: DC

## 2023-10-05 MED ORDER — ONDANSETRON HCL 4 MG/2ML IJ SOLN
4.0000 mg | Freq: Once | INTRAMUSCULAR | Status: AC
  Administered 2023-10-05: 4 mg via INTRAVENOUS
  Filled 2023-10-05: qty 2

## 2023-10-05 MED ORDER — INSULIN GLARGINE-YFGN 100 UNIT/ML ~~LOC~~ SOLN: 25.0000 [IU] | Freq: Two times a day (BID) | SUBCUTANEOUS | Status: DC

## 2023-10-05 MED ORDER — SODIUM CHLORIDE 0.9 % IV BOLUS: 500.0000 mL | Freq: Once | INTRAVENOUS | Status: DC

## 2023-10-05 MED ORDER — MIDODRINE HCL 5 MG PO TABS
10.0000 mg | ORAL_TABLET | Freq: Three times a day (TID) | ORAL | Status: DC
  Administered 2023-10-05 – 2023-10-14 (×9): 10 mg via ORAL
  Filled 2023-10-05 (×14): qty 2

## 2023-10-05 MED ORDER — ONDANSETRON HCL 4 MG/2ML IJ SOLN
INTRAMUSCULAR | Status: DC | PRN
  Administered 2023-10-05: 4 mg via INTRAVENOUS

## 2023-10-05 MED ORDER — FENTANYL CITRATE (PF) 100 MCG/2ML IJ SOLN
INTRAMUSCULAR | Status: DC | PRN
  Administered 2023-10-05: 50 ug via INTRAVENOUS

## 2023-10-05 MED ORDER — MIDAZOLAM HCL 2 MG/2ML IJ SOLN
INTRAMUSCULAR | Status: DC | PRN
  Administered 2023-10-05: 2 mg via INTRAVENOUS

## 2023-10-05 MED ORDER — MIDODRINE HCL 5 MG PO TABS: 10.0000 mg | ORAL_TABLET | Freq: Three times a day (TID) | ORAL | Status: DC | PRN

## 2023-10-05 MED ORDER — FLUMAZENIL 0.5 MG/5ML IV SOLN
0.2000 mg | Freq: Once | INTRAVENOUS | Status: AC
Start: 2023-10-05 — End: 2023-10-05
  Administered 2023-10-05: 0.2 mg via INTRAVENOUS

## 2023-10-05 MED ORDER — ONDANSETRON HCL 4 MG/2ML IJ SOLN: 4.0000 mg | Freq: Three times a day (TID) | INTRAMUSCULAR | Status: AC | PRN

## 2023-10-05 MED ORDER — CLINDAMYCIN PHOSPHATE 600 MG/50ML IV SOLN
600.0000 mg | Freq: Once | INTRAVENOUS | Status: AC
  Administered 2023-10-05: 600 mg via INTRAVENOUS
  Filled 2023-10-05: qty 50

## 2023-10-05 MED ORDER — ALBUTEROL SULFATE (2.5 MG/3ML) 0.083% IN NEBU: 2.5000 mg | INHALATION_SOLUTION | RESPIRATORY_TRACT | Status: AC | PRN

## 2023-10-05 MED ORDER — INSULIN GLARGINE 100 UNIT/ML ~~LOC~~ SOLN
25.0000 [IU] | Freq: Two times a day (BID) | SUBCUTANEOUS | Status: DC
Start: 2023-10-06 — End: 2023-10-05
  Filled 2023-10-05 (×3): qty 0.25

## 2023-10-05 MED ORDER — PHENYLEPHRINE 80 MCG/ML (10ML) SYRINGE FOR IV PUSH (FOR BLOOD PRESSURE SUPPORT)
PREFILLED_SYRINGE | INTRAVENOUS | Status: DC | PRN
  Administered 2023-10-05 (×2): 240 ug via INTRAVENOUS
  Administered 2023-10-05 (×2): 160 ug via INTRAVENOUS

## 2023-10-05 MED ORDER — SUCCINYLCHOLINE CHLORIDE 200 MG/10ML IV SOSY
PREFILLED_SYRINGE | INTRAVENOUS | Status: DC | PRN
  Administered 2023-10-05: 120 mg via INTRAVENOUS

## 2023-10-05 MED ORDER — LIDOCAINE HCL (PF) 2 % IJ SOLN
INTRAMUSCULAR | Status: AC
  Filled 2023-10-05: qty 5

## 2023-10-05 MED ORDER — ACETAMINOPHEN 650 MG RE SUPP
650.0000 mg | Freq: Four times a day (QID) | RECTAL | Status: DC | PRN
Start: 2023-10-05 — End: 2023-10-14

## 2023-10-05 MED ORDER — HYDROCODONE-ACETAMINOPHEN 5-325 MG PO TABS: 1.0000 | ORAL_TABLET | ORAL | Status: DC | PRN

## 2023-10-05 MED ORDER — METRONIDAZOLE 500 MG/100ML IV SOLN
500.0000 mg | Freq: Two times a day (BID) | INTRAVENOUS | Status: AC
  Administered 2023-10-05 – 2023-10-12 (×15): 500 mg via INTRAVENOUS
  Filled 2023-10-05 (×16): qty 100

## 2023-10-05 MED ORDER — SODIUM CHLORIDE 0.9 % IV SOLN
2.0000 g | Freq: Once | INTRAVENOUS | Status: AC
  Administered 2023-10-05: 2 g via INTRAVENOUS
  Filled 2023-10-05: qty 12.5

## 2023-10-05 MED ORDER — SUGAMMADEX SODIUM 200 MG/2ML IV SOLN
INTRAVENOUS | Status: DC | PRN
  Administered 2023-10-05: 200 mg via INTRAVENOUS
  Administered 2023-10-05: 400 mg via INTRAVENOUS

## 2023-10-05 MED ORDER — ACETAMINOPHEN 10 MG/ML IV SOLN: 1000.0000 mg | Freq: Once | INTRAVENOUS | Status: DC | PRN

## 2023-10-05 MED ORDER — VASOPRESSIN 20 UNIT/ML IV SOLN
INTRAVENOUS | Status: DC | PRN
  Administered 2023-10-05 (×3): 2 [IU] via INTRAVENOUS

## 2023-10-05 MED ORDER — HYDROMORPHONE HCL 1 MG/ML IJ SOLN
0.5000 mg | Freq: Once | INTRAMUSCULAR | Status: AC
  Administered 2023-10-05: 0.5 mg via INTRAVENOUS
  Filled 2023-10-05: qty 0.5

## 2023-10-05 MED ORDER — SODIUM CHLORIDE 0.9 % IV BOLUS
1000.0000 mL | Freq: Once | INTRAVENOUS | Status: AC
  Administered 2023-10-05: 1000 mL via INTRAVENOUS

## 2023-10-05 MED ORDER — DROPERIDOL 2.5 MG/ML IJ SOLN: 0.6250 mg | Freq: Once | INTRAMUSCULAR | Status: DC | PRN

## 2023-10-05 MED ORDER — ROCURONIUM BROMIDE 100 MG/10ML IV SOLN
INTRAVENOUS | Status: DC | PRN
  Administered 2023-10-05: 30 mg via INTRAVENOUS

## 2023-10-05 MED ORDER — GABAPENTIN 300 MG PO CAPS
300.0000 mg | ORAL_CAPSULE | Freq: Two times a day (BID) | ORAL | Status: DC
  Administered 2023-10-05 – 2023-10-14 (×18): 300 mg via ORAL
  Filled 2023-10-05 (×18): qty 1

## 2023-10-05 MED ORDER — CHLORHEXIDINE GLUCONATE CLOTH 2 % EX PADS: 6.0000 | MEDICATED_PAD | Freq: Once | CUTANEOUS | Status: DC

## 2023-10-05 MED ORDER — OXYCODONE HCL 5 MG/5ML PO SOLN: 5.0000 mg | Freq: Once | ORAL | Status: DC | PRN

## 2023-10-05 MED ORDER — PROPOFOL 10 MG/ML IV BOLUS
INTRAVENOUS | Status: DC | PRN
  Administered 2023-10-05: 160 mg via INTRAVENOUS

## 2023-10-05 MED ORDER — VANCOMYCIN HCL 1000 MG IV SOLR
INTRAVENOUS | Status: DC | PRN
  Administered 2023-10-05: 1000 mg via TOPICAL

## 2023-10-05 MED ORDER — SODIUM CHLORIDE 0.9 % BOLUS PEDS
250.0000 mL | Freq: Once | INTRAVENOUS | Status: AC
  Administered 2023-10-05: 250 mL via INTRAVENOUS

## 2023-10-05 MED ORDER — ACETAMINOPHEN 325 MG PO TABS: 650.0000 mg | ORAL_TABLET | Freq: Four times a day (QID) | ORAL | Status: DC | PRN

## 2023-10-05 MED ORDER — FENTANYL CITRATE (PF) 100 MCG/2ML IJ SOLN: 25.0000 ug | INTRAMUSCULAR | Status: DC | PRN

## 2023-10-05 MED ORDER — DEXAMETHASONE SODIUM PHOSPHATE 10 MG/ML IJ SOLN
INTRAMUSCULAR | Status: DC | PRN
  Administered 2023-10-05: 5 mg via INTRAVENOUS

## 2023-10-05 MED ORDER — MORPHINE SULFATE (PF) 2 MG/ML IV SOLN
2.0000 mg | INTRAVENOUS | Status: DC | PRN
  Administered 2023-10-05 (×3): 2 mg via INTRAVENOUS
  Filled 2023-10-05 (×3): qty 1

## 2023-10-05 MED ORDER — FENTANYL CITRATE (PF) 100 MCG/2ML IJ SOLN
INTRAMUSCULAR | Status: AC
Start: 2023-10-05 — End: ?
  Filled 2023-10-05: qty 2

## 2023-10-05 MED ORDER — OXYCODONE-ACETAMINOPHEN 5-325 MG PO TABS
1.0000 | ORAL_TABLET | Freq: Four times a day (QID) | ORAL | Status: DC | PRN
  Administered 2023-10-05 – 2023-10-11 (×7): 2 via ORAL
  Administered 2023-10-11: 1 via ORAL
  Administered 2023-10-11: 2 via ORAL
  Administered 2023-10-12 – 2023-10-13 (×3): 1 via ORAL
  Administered 2023-10-13: 2 via ORAL
  Filled 2023-10-05 (×5): qty 2
  Filled 2023-10-05: qty 1
  Filled 2023-10-05: qty 2
  Filled 2023-10-05 (×3): qty 1
  Filled 2023-10-05 (×4): qty 2

## 2023-10-05 MED ORDER — 0.9 % SODIUM CHLORIDE (POUR BTL) OPTIME
TOPICAL | Status: DC | PRN
  Administered 2023-10-05: 500 mL

## 2023-10-05 MED ORDER — MORPHINE SULFATE (PF) 2 MG/ML IV SOLN
2.0000 mg | INTRAVENOUS | Status: DC | PRN
  Administered 2023-10-06 – 2023-10-12 (×13): 2 mg via INTRAVENOUS
  Filled 2023-10-05 (×13): qty 1

## 2023-10-05 MED ORDER — PROPOFOL 10 MG/ML IV BOLUS
INTRAVENOUS | Status: AC
  Filled 2023-10-05: qty 20

## 2023-10-05 MED ORDER — MIDAZOLAM HCL 2 MG/2ML IJ SOLN
INTRAMUSCULAR | Status: AC
  Filled 2023-10-05: qty 2

## 2023-10-05 MED ORDER — VANCOMYCIN HCL 1750 MG/350ML IV SOLN
1750.0000 mg | Freq: Once | INTRAVENOUS | Status: AC
  Administered 2023-10-05: 1750 mg via INTRAVENOUS
  Filled 2023-10-05: qty 350

## 2023-10-05 MED ORDER — INSULIN ASPART 100 UNIT/ML IJ SOLN
0.0000 [IU] | INTRAMUSCULAR | Status: DC
  Filled 2023-10-05: qty 1

## 2023-10-05 MED ORDER — MIDODRINE HCL 5 MG PO TABS: 10.0000 mg | ORAL_TABLET | Freq: Three times a day (TID) | ORAL | Status: DC

## 2023-10-05 MED ORDER — ACETAMINOPHEN 10 MG/ML IV SOLN
INTRAVENOUS | Status: DC | PRN
  Administered 2023-10-05: 1000 mg via INTRAVENOUS

## 2023-10-05 SURGICAL SUPPLY — 54 items
BLADE SAGITTAL WIDE XTHICK NO (BLADE) IMPLANT
BLADE SAW SAG 25.4X90 (BLADE) ×1 IMPLANT
BLADE SURG SZ10 CARB STEEL (BLADE) ×1 IMPLANT
BNDG COHESIVE 4X5 TAN STRL LF (GAUZE/BANDAGES/DRESSINGS) ×2 IMPLANT
BNDG ELASTIC 4INX 5YD STR LF (GAUZE/BANDAGES/DRESSINGS) ×1 IMPLANT
BNDG ELASTIC 6X5.8 VLCR NS LF (GAUZE/BANDAGES/DRESSINGS) ×1 IMPLANT
BNDG ESMARCH 6X12 STRL LF (GAUZE/BANDAGES/DRESSINGS) ×1 IMPLANT
BNDG GAUZE DERMACEA FLUFF 4 (GAUZE/BANDAGES/DRESSINGS) ×2 IMPLANT
BRUSH SCRUB EZ 4% CHG (MISCELLANEOUS) ×1 IMPLANT
CANISTER WOUND CARE 500ML ATS (WOUND CARE) IMPLANT
CHLORAPREP W/TINT 26 (MISCELLANEOUS) ×3 IMPLANT
CUFF TOURN SGL QUICK 18X4 (TOURNIQUET CUFF) IMPLANT
CUFF TRNQT CYL 24X4X16.5-23 (TOURNIQUET CUFF) IMPLANT
DRAPE FLUOR MINI C-ARM 54X84 (DRAPES) ×1 IMPLANT
DRAPE INCISE IOBAN 66X45 STRL (DRAPES) ×1 IMPLANT
DRAPE U-SHAPE 47X51 STRL (DRAPES) ×1 IMPLANT
DRSG VAC GRANUFOAM MED (GAUZE/BANDAGES/DRESSINGS) IMPLANT
ELECT CAUTERY BLADE 6.4 (BLADE) ×2 IMPLANT
ELECT REM PT RETURN 9FT ADLT (ELECTROSURGICAL) ×2 IMPLANT
ELECTRODE REM PT RTRN 9FT ADLT (ELECTROSURGICAL) ×2 IMPLANT
GAUZE SPONGE 4X4 12PLY STRL (GAUZE/BANDAGES/DRESSINGS) ×1 IMPLANT
GAUZE XEROFORM 1X8 LF (GAUZE/BANDAGES/DRESSINGS) ×3 IMPLANT
GLOVE BIO SURGEON STRL SZ7 (GLOVE) ×2 IMPLANT
GLOVE BIO SURGEON STRL SZ8 (GLOVE) ×2 IMPLANT
GLOVE INDICATOR 8.0 STRL GRN (GLOVE) ×1 IMPLANT
GOWN STRL REUS W/ TWL LRG LVL3 (GOWN DISPOSABLE) ×4 IMPLANT
GOWN STRL REUS W/ TWL XL LVL3 (GOWN DISPOSABLE) ×1 IMPLANT
GOWN STRL REUS W/TWL 2XL LVL3 (GOWN DISPOSABLE) ×1 IMPLANT
HANDLE YANKAUER SUCT BULB TIP (MISCELLANEOUS) ×1 IMPLANT
KIT STIMULAN RAPID CURE 5CC (Orthopedic Implant) IMPLANT
KIT TURNOVER KIT A (KITS) ×2 IMPLANT
LABEL OR SOLS (LABEL) ×1 IMPLANT
MANIFOLD NEPTUNE II (INSTRUMENTS) ×2 IMPLANT
MAT ABSORB FLUID 56X50 GRAY (MISCELLANEOUS) ×1 IMPLANT
NDL FILTER BLUNT 18X1 1/2 (NEEDLE) ×1 IMPLANT
NEEDLE FILTER BLUNT 18X1 1/2 (NEEDLE) IMPLANT
NS IRRIG 1000ML POUR BTL (IV SOLUTION) ×2 IMPLANT
PACK EXTREMITY ARMC (MISCELLANEOUS) ×2 IMPLANT
PAD ABD DERMACEA PRESS 5X9 (GAUZE/BANDAGES/DRESSINGS) ×2 IMPLANT
PAD PREP OB/GYN DISP 24X41 (PERSONAL CARE ITEMS) ×1 IMPLANT
SPONGE T-LAP 18X18 ~~LOC~~+RFID (SPONGE) ×1 IMPLANT
STAPLER SKIN PROX 35W (STAPLE) ×2 IMPLANT
STOCKINETTE M/LG 89821 (MISCELLANEOUS) ×2 IMPLANT
SUT PROLENE 4 0 PS 2 18 (SUTURE) ×2 IMPLANT
SUT SILK 2 0 SH (SUTURE) ×2 IMPLANT
SUT SILK 2-0 18XBRD TIE 12 (SUTURE) ×1 IMPLANT
SUT SILK 3-0 18XBRD TIE 12 (SUTURE) ×1 IMPLANT
SUT VIC AB 0 CT1 36 (SUTURE) ×2 IMPLANT
SUT VIC AB 2-0 CT1 (SUTURE) ×2 IMPLANT
SUT VIC AB 2-0 CT1 TAPERPNT 27 (SUTURE) IMPLANT
SUT VIC AB 2-0 SH 27XBRD (SUTURE) ×2 IMPLANT
SYR 10ML LL (SYRINGE) ×1 IMPLANT
TRAP FLUID SMOKE EVACUATOR (MISCELLANEOUS) ×2 IMPLANT
WATER STERILE IRR 500ML POUR (IV SOLUTION) ×2 IMPLANT
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
## 2023-10-05 NOTE — Anesthesia Postprocedure Evaluation (Signed)
 Anesthesia Post Note  Patient: Donna Fisher  Procedure(s) Performed: LEFT FOOT AND ANKLE AMPUTATION (Left: Leg Lower) REMOVAL, HARDWARE (Left: Leg Lower)  Anesthesia Type: General Anesthetic complications: no   No notable events documented.   Last Vitals:  Vitals:   10/05/23 1830 10/05/23 1845  BP: (!) 79/50 (!) 76/47  Pulse: 80 80  Resp: 10 10  Temp:    SpO2: 96% 96%    Last Pain:  Vitals:   10/05/23 1845  TempSrc:   PainSc: 0-No pain                 Yevette Edwards
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
## 2023-10-05 NOTE — Op Note (Signed)
 10/05/2023  5:17 PM  Patient:   Donna Fisher  Pre-Op Diagnosis:   Diabetic foot ulcer with chronic osteomyelitis and early gangrene status post TTC nailing for displaced trimalleolar ankle fracture, left lower leg/foot.  Post-Op Diagnosis:   Same  Procedure:   Hardware removal left lower extremity  Surgeon:   [name], MD  Assistant:   J. Derald Macleod, MD  Brief Clinical Note:   The patient is a 58 year old female with multiple medical problems including poorly controlled diabetes, diabetic peripheral neuropathy, chronic kidney disease, and anxiety who is now 1 year status post a retrograde intramedullary TTC nailing of the left hindfoot and tibia for a displaced unstable trimalleolar left ankle fracture by an outside surgeon.  The patient to her podiatrist yesterday complaining of increased pain and swelling in her foot.  He was concerned about a deeper infection and so referred her to the emergency room where she subsequently was admitted.  X-rays and examination were concerning for a large plantar ulcer resulting from prominence of the nail with osteomyelitis extending into the calcaneus and up the shaft of the tibia.  There also was evidence of gas in the subcutaneous tissues around her ankle, concerning for gangrene.  The patient presents at this time for hardware removal and guillotine amputation of her left lower leg and foot.  Procedure:   Please see Dr. Driscilla Grammes operative note for the bulk of the procedure.  I was present to help with removal of the retrograde intramedullary nail.  Once Dr. Wyn Quaker had exposed the tibia at the level of the guillotine amputation site, I used a reciprocating saw to transect the tibia and fibula at this level.  As the foot was removed, the intramedullary nail came out with the foot.  Using a long curette, several intramedullary tissue samples were obtained and sent for culture and sensitivity.  The case was then turned back over to Dr. Wyn Quaker for completion.

## 2023-10-05 NOTE — Op Note (Signed)
   OPERATIVE NOTE   PROCEDURE: Left open (guillotine) amputation left lower leg just above the ankle Removal of hardware from tibia by Dr. Joice Lofts [facility] dressing left open amputation   PRE-OPERATIVE DIAGNOSIS: Left foot gas gangrene, infected hardware in the tibia  POST-OPERATIVE DIAGNOSIS: same as above  SURGEON: [name], MD  COSURGEONNinfa Linden, MD (primary for removal of hardware)  ANESTHESIA: general  ESTIMATED BLOOD LOSS: 100 cc  FINDING(S): Rod was able to be removed from the tibia from the lower leg incision.  SPECIMEN(S):  Left foot and ankle Culture of the intramedullary portion of the left tibia  INDICATIONS:   Donna Fisher is a 58 y.o. female who presents with left leg gas gangrene.  She had hardware in her tibia that was infected and would need to be removed and orthopedics was involved to help remove the hardware as needed.  The patient is scheduled for a left below-the-knee amputation.  I discussed in depth with the patient the risks, benefits, and alternatives to this procedure.  The patient is aware that the risk of this operation included but are not limited to:  bleeding, infection, myocardial infarction, stroke, death, failure to heal amputation wound, and possible need for more proximal amputation.  The patient is aware of the risks and agrees proceed forward with the procedure.  DESCRIPTION:  After full informed written consent was obtained from the patient, the patient was brought back to the operating room, and placed supine upon the operating table.  Prior to induction, the patient received IV antibiotics.  The patient was then prepped and draped in the standard fashion for a below-the-knee amputation.  After obtaining adequate anesthesia, the patient was prepped and draped in the standard fashion for a left below-the-knee amputation.  A circular incision was then created about 3 to 4 cm above the ankle and we dissected down with a scalpel and  then electrocautery down to the tibia and fibula.  The posterior tibial and the anterior tibial arteries were identified and ligated with silk suture ligatures.  We then transected the fibula with the oscillating saw.  The tibia was then transected circumferentially with oscillating saw down to the rod. Dr. Joice Lofts was scrubbed in and once we had the tibia exposed and transected and the bony portion, he was able to easily remove the hardware from the tibia by removing it with the transected tibia and fibula as 1 specimen.  He then used a curette and was able to gain a good culture from the intramedullary portion of the tibia well superior to our incision.  The wound was then irrigated with sterile saline.  Vancomycin impregnated beads were then placed into the wound and tucked into the intramedullary portion of the tibia where the rod was.  All vessels were controlled with 2-0 silk ties or suture ligatures or electrocautery as needed.  A small VAC sponge was then cut to fit the wound.  Strips of [place] were used and a good occlusive seal was obtained once was connected to suction tubing.  The patient was awakened from anesthesia and taken to the recovery room in stable condition having tolerated the procedure well.  COMPLICATIONS: none  CONDITION: stable   [name]  10/05/2023, 5:40 PM    This note was created with Dragon Medical transcription system. Any errors in dictation are purely unintentional.

## 2023-10-05 NOTE — Anesthesia Procedure Notes (Signed)
 Procedure Name: Intubation Date/Time: 10/05/2023 4:31 PM  Performed by: Ginger Carne, CRNAPre-anesthesia Checklist: Patient identified, Emergency Drugs available, Suction available, Patient being monitored and Timeout performed Patient Re-evaluated:Patient Re-evaluated prior to induction Oxygen Delivery Method: Circle system utilized Preoxygenation: Pre-oxygenation with 100% oxygen Induction Type: IV induction, Rapid sequence and Cricoid Pressure applied Laryngoscope Size: McGrath and 3 Grade View: Grade I Tube type: Oral Tube size: 6.5 mm Number of attempts: 1 Airway Equipment and Method: Stylet and Video-laryngoscopy Placement Confirmation: ETT inserted through vocal cords under direct vision, positive ETCO2 and breath sounds checked- equal and bilateral Secured at: 20 cm Tube secured with: Tape Dental Injury: Teeth and Oropharynx as per pre-operative assessment

## 2023-10-05 NOTE — Consult Note (Addendum)
 Pharmacy Antibiotic Note  ASSESSMENT: 58 y.o. female with PMH including T2DM, HLD, PAD, DFI/DFO, cellulitis, osteomyelitis, gangrene, carotid stenosis is presenting with  necrotizing infection of left ankle . Current plan is for patient to undergo amputation and to start antibiotics in the meantime. Pharmacy has been consulted to manage cefepime and vancomycin dosing. Patient's serum creatinine slightly above baseline - will dose vanc by AUC and continue to monitor.   Patient with allergy to penicillins and ceftriaxone in chart. Patient received meropenem around the same time as ceftriaxone and so it is possible meropenem contributed to rash, however temporally more likely ceftriaxone.  After discussion with ID pharmacist and provider, ceftriaxone reaction deemed to not be convincing and will administer cefepime x 1 and monitor for reaction.  Update: Patient tolerated cefepime dose.  Patient measurements: Height: 5' 2.5" (158.8 cm) Weight: 73.9 kg (163 lb) IBW/kg (Calculated) : 51.25  Vital signs: Temp: 97.4 F (36.3 C) (03/19 0625) Temp Source: Oral (03/19 0625) BP: 125/77 (03/19 0625) Pulse Rate: 81 (03/19 0625) Recent Labs  Lab 10/04/23 2132  WBC 5.5  CREATININE 2.04*   Estimated Creatinine Clearance: 29 mL/min (A) (by C-G formula based on SCr of 2.04 mg/dL (H)).  Allergies: Allergies  Allergen Reactions   Atorvastatin Hives   Penicillins Swelling, Other (See Comments) and Anaphylaxis    .Has patient had a PCN reaction causing immediate rash, facial/tongue/throat swelling, SOB or lightheadedness with hypotension: No  Has patient had a PCN reaction causing severe rash involving mucus membranes or skin necrosis: No  Has patient had a PCN reaction that required hospitalization: No  Has patient had a PCN reaction occurring within the last 10 years: No  If all of the above answers are "NO", then may proceed with Cephalosporin use.  Around 2014 or earlier, she had severe  rash and throat swelling to penicillins   Codeine Itching and Other (See Comments)   Ceftriaxone Rash   Linezolid Nausea Only    Antimicrobials this admission: Cefepime 3/19 >> Vancomycin 3/19 >> Flagyl >>  Dose adjustments this admission: N/A  Microbiology results: 3/19 Wound cx: in process   PLAN: Initiate cefepime 2 g IV q24H Administer vancomycin 1750 mg IV x 1 as a loading dose followed by 750 mg IV q24H eAUC 557, Cmax 31, Cmin 17 Scr 2.04, IBW, Vd 0.72 L/kg Follow up culture results to assess for antibiotic optimization. Monitor renal function to assess for any necessary antibiotic dosing changes. Patient also on metronidazole 500 mg IV q12H   Thank you for allowing pharmacy to be a part of this patient's care.  Will M. Dareen Piano, PharmD Clinical Pharmacist 10/05/2023 11:07 AM
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
## 2023-10-05 NOTE — Transfer of Care (Signed)
 Immediate Anesthesia Transfer of Care Note  Patient: Donna Fisher  Procedure(s) Performed: LEFT FOOT AND ANKLE AMPUTATION (Left: Leg Lower) REMOVAL, HARDWARE (Left: Leg Lower)  Patient Location: PACU  Anesthesia Type:General  Level of Consciousness: sedated  Airway & Oxygen Therapy: Patient Spontanous Breathing and Patient connected to face mask oxygen  Post-op Assessment: Report given to RN and Post -op Vital signs reviewed and stable  Post vital signs: Reviewed and stable  Last Vitals:  Vitals Value Taken Time  BP 81/46 10/05/23 1741  Temp    Pulse 76 10/05/23 1741  Resp    SpO2 100 % 10/05/23 1741    Last Pain:  Vitals:   10/05/23 1540  TempSrc: Temporal  PainSc: 6       Patients Stated Pain Goal: 0 (10/05/23 4098)  Complications: No notable events documented.

## 2023-10-05 NOTE — Interval H&P Note (Signed)
 History and Physical Interval Note:  10/05/2023 3:31 PM  Donna Fisher  has presented today for surgery, with the diagnosis of diabetic foot ulcer.  The various methods of treatment have been discussed with the patient and family. After consideration of risks, benefits and other options for treatment, the patient has consented to  Procedure(s) with comments: AMPUTATION BELOW KNEE (Left) - Guillotine REMOVAL, HARDWARE (Left) as a surgical intervention.  The patient's history has been reviewed, patient examined, no change in status, stable for surgery.  I have reviewed the patient's chart and labs.  Questions were answered to the patient's satisfaction.     [name]

## 2023-10-06 ENCOUNTER — Inpatient Hospital Stay

## 2023-10-06 ENCOUNTER — Encounter: Payer: Self-pay | Admitting: Vascular Surgery

## 2023-10-06 DIAGNOSIS — E118 Type 2 diabetes mellitus with unspecified complications: Secondary | ICD-10-CM | POA: Diagnosis not present

## 2023-10-06 LAB — CREATININE, SERUM
Creatinine, Ser: 1.73 mg/dL — ABNORMAL HIGH (ref 0.44–1.00)
GFR, Estimated: 34 mL/min — ABNORMAL LOW (ref >60)

## 2023-10-06 LAB — GLUCOSE, CAPILLARY
Glucose-Capillary: 171 mg/dL — ABNORMAL HIGH (ref 70–99)
Glucose-Capillary: 207 mg/dL — ABNORMAL HIGH (ref 70–99)
Glucose-Capillary: 156 mg/dL — ABNORMAL HIGH (ref 70–99)
Glucose-Capillary: 148 mg/dL — ABNORMAL HIGH (ref 70–99)

## 2023-10-06 MED ORDER — ONDANSETRON 4 MG PO TBDP
4.0000 mg | ORAL_TABLET | Freq: Three times a day (TID) | ORAL | Status: DC | PRN
  Filled 2023-10-06: qty 1

## 2023-10-06 MED ORDER — GADOBUTROL 1 MMOL/ML IV SOLN
7.0000 mL | Freq: Once | INTRAVENOUS | Status: AC | PRN
  Administered 2023-10-06: 7 mL via INTRAVENOUS

## 2023-10-06 MED ORDER — BACITRACIN ZINC 500 UNIT/GM EX OINT
TOPICAL_OINTMENT | Freq: Every day | CUTANEOUS | Status: DC
  Administered 2023-10-06 – 2023-10-13 (×5): 1 via TOPICAL
  Filled 2023-10-06 (×9): qty 0.9

## 2023-10-06 MED ORDER — ONDANSETRON HCL 4 MG/2ML IJ SOLN
4.0000 mg | Freq: Four times a day (QID) | INTRAMUSCULAR | Status: DC | PRN
  Administered 2023-10-06 – 2023-10-10 (×6): 4 mg via INTRAVENOUS
  Filled 2023-10-06 (×6): qty 2

## 2023-10-06 MED ORDER — ENOXAPARIN SODIUM 40 MG/0.4ML IJ SOSY
40.0000 mg | PREFILLED_SYRINGE | INTRAMUSCULAR | Status: DC
  Administered 2023-10-06 – 2023-10-11 (×5): 40 mg via SUBCUTANEOUS
  Filled 2023-10-06 (×5): qty 0.4

## 2023-10-06 MED ORDER — PROCHLORPERAZINE EDISYLATE 10 MG/2ML IJ SOLN
5.0000 mg | Freq: Four times a day (QID) | INTRAMUSCULAR | Status: DC | PRN
  Administered 2023-10-06 – 2023-10-13 (×13): 5 mg via INTRAVENOUS
  Filled 2023-10-06: qty 2
  Filled 2023-10-06 (×3): qty 1
  Filled 2023-10-06: qty 2
  Filled 2023-10-06: qty 1
  Filled 2023-10-06: qty 2
  Filled 2023-10-06: qty 1
  Filled 2023-10-06: qty 2
  Filled 2023-10-06 (×3): qty 1
  Filled 2023-10-06 (×3): qty 2

## 2023-10-06 MED ORDER — SODIUM CHLORIDE 0.9 % IV SOLN
2.0000 g | Freq: Two times a day (BID) | INTRAVENOUS | Status: AC
  Administered 2023-10-06 – 2023-10-12 (×14): 2 g via INTRAVENOUS
  Filled 2023-10-06 (×15): qty 12.5

## 2023-10-06 NOTE — Consult Note (Signed)
 WOC Nurse Consult Note: Ortho team following for assessment and plan of care to left foot.  WOC team has not been requested to change Vac dressing to this location.   Consult requested for right foot wounds.  Pt has 2 areas of full thickness wounds to right plantar outer foot.  These were followed by the podiatry service prior to admission and Pt states she has been using Bacitracin and dry dressings.  Pt should follow-up with the podiatrist after discharge.  Right outer foot wounds are 980% red, 20% yellow, small amt yellow drainage; 1.5X1.5X.3cm and .8X.8X.3cm.  Dressing procedure/placement/frequency: Topical treatment orders provided for bedside nurses to perform as follows: Apply Bacitracin to right foot wounds (2) Q day, then cover with foam dressings.  Change foam dressing Q 3 days or PRN soiling. Please re-consult if further assistance is needed.  Thank-you,  Cammie Mcgee MSN, RN, CWOCN, [place], CNS [phone]

## 2023-10-06 NOTE — Progress Notes (Signed)
  PROGRESS NOTE    SAREEN RANDON  UJW:[id number] DOB: [birth year] DOA: 10/05/2023 PCP: Abram Sander, MD  122A/122A-BB  LOS: 1 day   Brief hospital course:   Assessment & Plan: Donna Fisher is a 58 y.o. female with medical history significant of type 2 diabetes, diabetic neuropathy, stage III chronic kidney disease, who was sent to ED by Dr. Ether Griffins of podiatry after xray showed infection and gas in the left foot.    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 Wyn Quaker and removal of hardware by Dr. Joice Lofts on 3/19 --f/u Surgical cx  --cont vanc, cefepime and Flagyl   Hypotension --cont home midodrine   DM2 --BG mostly in 100's --hold long-acting insulin for now --ACHS and SSI for now   CKD 3b --monitor  Diabetic neuropathy --cont home gabapentin   DVT prophylaxis: Lovenox SQ Code Status: Full code  Family Communication:  Level of care: Med-Surg Dispo:   The patient is from: home Anticipated d/c is to: home Anticipated d/c date is: Monday or after   Subjective and Interval History:  Pt reported left leg pain controlled.  RN reported nausea in the afternoon.   Objective: Vitals:   10/06/23 0359 10/06/23 1640 10/06/23 1713 10/06/23 2113  BP: (!) 95/54 113/61 (!) 147/73 (!) 100/58  Pulse: 80 76 83 79  Resp:  16  20  Temp: 97.6 F (36.4 C) 97.7 F (36.5 C)  (!) 97.5 F (36.4 C)  TempSrc:      SpO2: 97% 100% 97% 95%  Weight:      Height:        Intake/Output Summary (Last 24 hours) at 10/06/2023 2119 Last data filed at 10/06/2023 2000 Gross per 24 hour  Intake 889.82 ml  Output 925 ml  Net -35.18 ml   Filed Weights   10/04/23 2129 10/05/23 0625 10/05/23 1540  Weight: 73.5 kg 73.9 kg 74.8 kg    Examination:   Constitutional: NAD, AAOx3 HEENT: conjunctivae and lids normal, EOMI CV: No cyanosis.   RESP: normal respiratory effort, on  RA Extremities: left amputation below the ankle, with wound vac SKIN: warm, dry Neuro: II - XII grossly intact.   Psych: Normal mood and affect.  Appropriate judgement and reason   Data Reviewed: I have personally reviewed labs and imaging studies  Time spent: 35 minutes  Darlin Priestly, MD [facility] If 7PM-7AM, please contact night-coverage 10/06/2023, 9:19 PM

## 2023-10-06 NOTE — Evaluation (Signed)
 Physical Therapy Evaluation Patient Details Name: Donna Fisher MRN: [id number] DOB: February 23, 1966 Today's Date: 10/06/2023  History of Present Illness  Pt is a 58 yo female s/p L foot guillotine amputation. PMH of DM, CKD, anxiety, L ankle fx.  Clinical Impression  Patient alert, with OT at bedside, reported 8/10 L leg pain. Per pt at baseline pt is modI, does have a rollator at home. Pt did experience nausea and vomiting with all mobility today, but pt very motivated to transfer to [facility] to attempt to urinate. She was able to stand pivot to [facility] with CGA,x2 with RW, utilized BSC arm and IV pole stabilized by OT. Good sitting balance. Sit <> stand from [facility] CGA, CGA,x2 to hop to recliner, noted for good eccentric control while hopping. Pt in recliner with needs in reach, RN notified of pt status (had given zofran 30 minutes prior to rehab session).  Overall the patient demonstrated deficits (see "PT Problem List") that impede the patient's functional abilities, safety, and mobility and would benefit from skilled PT intervention.          If plan is discharge home, recommend the following: A little help with walking and/or transfers;A little help with bathing/dressing/bathroom;Assistance with cooking/housework;Assist for transportation;Help with stairs or ramp for entrance   Can travel by private vehicle        Equipment Recommendations Rolling walker (2 wheels) (pt reported having a WC and RW at home)  Recommendations for Other Services       Functional Status Assessment Patient has had a recent decline in their functional status and demonstrates the ability to make significant improvements in function in a reasonable and predictable amount of time.     Precautions / Restrictions Precautions Precautions: Fall Recall of Precautions/Restrictions: Intact Restrictions Weight Bearing Restrictions Per Provider Order: Yes LLE Weight Bearing Per Provider Order: Non weight bearing       Mobility  Bed Mobility Overal bed mobility: Modified Independent                  Transfers Overall transfer level: Needs assistance Equipment used: Rolling walker (2 wheels), None Transfers: Sit to/from Stand, Bed to chair/wheelchair/BSC Sit to Stand: Contact guard assist, +2 safety/equipment Stand pivot transfers: Contact guard assist, +2 safety/equipment         General transfer comment: able to minimally hop to recliner in room ~77ft, CGA for safety. stand pivot to [facility] CGA for steadying    Ambulation/Gait                  Stairs            Wheelchair Mobility     Tilt Bed    Modified Rankin (Stroke Patients Only)       Balance Overall balance assessment: Needs assistance Sitting-balance support: No upper extremity supported, Feet supported Sitting balance-Leahy Scale: Normal Sitting balance - Comments: pericare in sitting   Standing balance support: Bilateral upper extremity supported Standing balance-Leahy Scale: Fair                               Pertinent Vitals/Pain Pain Assessment Pain Assessment: 0-10 Pain Score: 8  Pain Location: L residual limb Pain Descriptors / Indicators: Discomfort, Grimacing Pain Intervention(s): Limited activity within patient's tolerance, Monitored during session, Repositioned    Home Living Family/patient expects to be discharged to:: Private residence Living Arrangements: Spouse/significant other Available Help at Discharge: Family (spouse works) Type of  Home: House Home Access: Stairs to enter Entrance Stairs-Rails: Doctor, general practice of Steps: 2   Home Layout: One level Home Equipment: Rollator (4 wheels);Shower seat - built in;Wheelchair - manual Additional Comments: can get a temporary ramp built    Prior Function Prior Level of Function : Independent/Modified Independent             Mobility Comments: 4WW for mobility       Extremity/Trunk Assessment    Upper Extremity Assessment Upper Extremity Assessment: Overall WFL for tasks assessed    Lower Extremity Assessment Lower Extremity Assessment: Overall WFL for tasks assessed (s/p L foot amp, able to move BLE against gravity)       Communication   Communication Communication: No apparent difficulties    Cognition Arousal: Alert Behavior During Therapy: WFL for tasks assessed/performed                             Following commands: Intact       Cueing       General Comments      Exercises     Assessment/Plan    PT Assessment Patient needs continued PT services  PT Problem List Decreased strength;Pain;Decreased range of motion;Decreased activity tolerance;Decreased knowledge of use of DME;Decreased balance;Decreased safety awareness;Decreased mobility;Decreased knowledge of precautions       PT Treatment Interventions DME instruction;Balance training;Neuromuscular re-education;Gait training;Stair training;Functional mobility training;Patient/family education;Therapeutic activities;Therapeutic exercise    PT Goals (Current goals can be found in the Care Plan section)  Acute Rehab PT Goals Patient Stated Goal: to feel better, get a prosthetic PT Goal Formulation: With patient Time For Goal Achievement: 10/20/23 Potential to Achieve Goals: Good    Frequency Min 3X/week     Co-evaluation PT/OT/SLP Co-Evaluation/Treatment: Yes Reason for Co-Treatment: For patient/therapist safety;To address functional/ADL transfers PT goals addressed during session: Mobility/safety with mobility OT goals addressed during session: ADL's and self-care       AM-PAC PT "6 Clicks" Mobility  Outcome Measure Help needed turning from your back to your side while in a flat bed without using bedrails?: None Help needed moving from lying on your back to sitting on the side of a flat bed without using bedrails?: None Help needed moving to and from a bed to a chair (including  a wheelchair)?: None Help needed standing up from a chair using your arms (e.g., wheelchair or bedside chair)?: A Little Help needed to walk in hospital room?: A Little Help needed climbing 3-5 steps with a railing? : A Lot 6 Click Score: 20    End of Session   Activity Tolerance: Patient tolerated treatment well;Other (comment) (limited by nausea) Patient left: in chair;with call bell/phone within reach;with chair alarm set Nurse Communication: Mobility status;Other (comment) (nausea) PT Visit Diagnosis: Other abnormalities of gait and mobility (R26.89);Difficulty in walking, not elsewhere classified (R26.2);Muscle weakness (generalized) (M62.81);Pain Pain - Right/Left: Left Pain - part of body: Leg    Time: 5784-6962 PT Time Calculation (min) (ACUTE ONLY): 30 min   Charges:   PT Evaluation $PT Eval Low Complexity: 1 Low PT Treatments $Therapeutic Activity: 8-22 mins PT General Charges $$ ACUTE PT VISIT: 1 Visit         Olga Coaster PT, DPT 3:23 PM,10/06/23

## 2023-10-06 NOTE — Evaluation (Signed)
 Occupational Therapy Evaluation Patient Details Name: Donna Fisher MRN: [id number] DOB: 29-Jan-1966 Today's Date: 10/06/2023   History of Present Illness   Pt is a 58 yo female s/p L foot guillotine amputation. PMH of DM, CKD, anxiety, L ankle fx.   Clinical Impressions Donna Fisher was seen for OT evaluation this date. Prior to hospital admission, pt was MOD I using 4WW. Pt lives with spouse in 1 level home. Pt currently MOD I for bed mobility and don/doff B socks in sitting. Requires CGA x2 bed>chair stand pivot. CGA + RW for BSC>chair step pivot t/f, +2 for safety and lines mgmt. Pt with multiple bouts of emesis, RN aware. Pt would benefit from skilled OT to address noted impairments and functional limitations (see below for any additional details). Upon hospital discharge, recommend no OT follow up.    If plan is discharge home, recommend the following:   A little help with walking and/or transfers;Help with stairs or ramp for entrance     Functional Status Assessment   Patient has had a recent decline in their functional status and demonstrates the ability to make significant improvements in function in a reasonable and predictable amount of time.     Equipment Recommendations   BSC/3in1     Recommendations for Other Services         Precautions/Restrictions   Precautions Precautions: Fall Recall of Precautions/Restrictions: Intact     Mobility Bed Mobility Overal bed mobility: Modified Independent                  Transfers Overall transfer level: Needs assistance Equipment used: Rolling walker (2 wheels) Transfers: Sit to/from Stand, Bed to chair/wheelchair/BSC Sit to Stand: Contact guard assist     Step pivot transfers: Contact guard assist, +2 safety/equipment            Balance Overall balance assessment: Needs assistance Sitting-balance support: No upper extremity supported, Feet supported Sitting balance-Leahy Scale: Normal      Standing balance support: Bilateral upper extremity supported Standing balance-Leahy Scale: Fair                             ADL either performed or assessed with clinical judgement   ADL Overall ADL's : Needs assistance/impaired                                       General ADL Comments: MOD I don/doff B socks in sitting. CGA + RW for BSC t/f, +2 for safety and lines mgmt      Pertinent Vitals/Pain Pain Assessment Pain Assessment: 0-10 Pain Score: 8  Pain Location: L residual limb Pain Descriptors / Indicators: Discomfort, Grimacing Pain Intervention(s): Limited activity within patient's tolerance, Repositioned     Extremity/Trunk Assessment Upper Extremity Assessment Upper Extremity Assessment: Overall WFL for tasks assessed   Lower Extremity Assessment Lower Extremity Assessment: Overall WFL for tasks assessed       Communication Communication Communication: No apparent difficulties   Cognition Arousal: Alert Behavior During Therapy: WFL for tasks assessed/performed Cognition: No apparent impairments                               Following commands: Intact                  Home Living  Family/patient expects to be discharged to:: Private residence Living Arrangements: Spouse/significant other Available Help at Discharge: Family (spouse works) Type of Home: House Home Access: Stairs to enter Secretary/administrator of Steps: 2 Entrance Stairs-Rails: Right;Left Home Layout: One level     Bathroom Shower/Tub: Runner, broadcasting/film/video: Rollator (4 wheels);Shower seat - built in;Wheelchair - manual   Additional Comments: can get a temporary ramp built      Prior Functioning/Environment Prior Level of Function : Independent/Modified Independent             Mobility Comments: 4WW for mobility      OT Problem List: Decreased activity tolerance   OT Treatment/Interventions:  Self-care/ADL training;Therapeutic exercise;Energy conservation;DME and/or AE instruction;Therapeutic activities      OT Goals(Current goals can be found in the care plan section)   Acute Rehab OT Goals Patient Stated Goal: to go home OT Goal Formulation: With patient/family Time For Goal Achievement: 10/20/23 Potential to Achieve Goals: Good ADL Goals Pt Will Perform Grooming: with modified independence;sitting Pt Will Perform Lower Body Dressing: with modified independence;sit to/from stand Pt Will Transfer to Toilet: with modified independence;ambulating;regular height toilet   OT Frequency:  Min 2X/week    Co-evaluation PT/OT/SLP Co-Evaluation/Treatment: Yes Reason for Co-Treatment: For patient/therapist safety;To address functional/ADL transfers PT goals addressed during session: Mobility/safety with mobility OT goals addressed during session: ADL's and self-care      AM-PAC OT "6 Clicks" Daily Activity     Outcome Measure Help from another person eating meals?: None Help from another person taking care of personal grooming?: None Help from another person toileting, which includes using toliet, bedpan, or urinal?: A Little Help from another person bathing (including washing, rinsing, drying)?: A Little Help from another person to put on and taking off regular upper body clothing?: None Help from another person to put on and taking off regular lower body clothing?: A Little 6 Click Score: 21   End of Session Nurse Communication: Mobility status  Activity Tolerance: Patient tolerated treatment well Patient left: in chair;with call bell/phone within reach;with chair alarm set;with family/visitor present  OT Visit Diagnosis: Other abnormalities of gait and mobility (R26.89)                Time: 2956-2130 OT Time Calculation (min): 30 min Charges:  OT General Charges $OT Visit: 1 Visit OT Evaluation $OT Eval Moderate Complexity: 1 Mod  Kathie Dike, M.S. OTR/L   10/06/23, 3:06 PM  ascom [phone]

## 2023-10-06 NOTE — Progress Notes (Signed)
 Progress Note    10/06/2023 1:40 PM 1 Day Post-Op  Subjective:  Donna Fisher is a 58 yo female now POD #1 from left Open Guillotine left lower extremity amputation just above the ankle. Patient is resting comfortably in bed. Wound vac in place and working well. Patient denies any pain. No complaints overnight and vitals all remain stable.    Vitals:   10/05/23 2027 10/06/23 0359  BP: (!) 93/53 (!) 95/54  Pulse: 81 80  Resp: 15   Temp: (!) 97.3 F (36.3 C) 97.6 F (36.4 C)  SpO2: 99% 97%   Physical Exam: Cardiac:  RRR, Normal S1, S2. No murmurs noted.  Lungs:  Clear on auscultation throughout. No rales, rhonchi and wheezing.  Incisions:  Left lower extremity Guillotine amputation with wound vac in place.  Extremities:  Left lower extremity with Open Amputation. Warm to touch. Right lower extremity with palpable DP/PT pulses. Warm to touch.  Abdomen:  Positive bowel sounds throughout, soft non tender and non distended.  Neurologic: AAOX3, Answers questions and follows commands.   CBC    Component Value Date/Time   WBC 5.5 10/04/2023 2132   RBC 3.88 10/04/2023 2132   HGB 11.3 (L) 10/04/2023 2132   HGB 12.2 05/10/2017 1001   HCT 35.7 (L) 10/04/2023 2132   HCT 35.5 05/10/2017 1001   PLT 356 10/04/2023 2132   PLT 267 05/10/2017 1001   MCV 92.0 10/04/2023 2132   MCV 88 05/10/2017 1001   MCV 90 11/03/2014 0521   MCH 29.1 10/04/2023 2132   MCHC 31.7 10/04/2023 2132   RDW 13.1 10/04/2023 2132   RDW 14.9 05/10/2017 1001   RDW 12.3 11/03/2014 0521   LYMPHSABS 0.6 (L) 11/06/2020 0444   LYMPHSABS 3.3 11/03/2014 0521   MONOABS 0.3 11/06/2020 0444   MONOABS 0.7 11/03/2014 0521   EOSABS 0.0 11/06/2020 0444   EOSABS 0.4 11/03/2014 0521   BASOSABS 0.0 11/06/2020 0444   BASOSABS 0.1 11/03/2014 0521    BMET    Component Value Date/Time   NA 137 10/04/2023 2132   NA 136 11/03/2014 0521   K 4.3 10/04/2023 2132   K 3.9 11/03/2014 0521   CL 104 10/04/2023 2132   CL 105  11/03/2014 0521   CO2 25 10/04/2023 2132   CO2 29 11/03/2014 0521   GLUCOSE 142 (H) 10/04/2023 2132   GLUCOSE 281 (H) 11/03/2014 0521   BUN 32 (H) 10/04/2023 2132   BUN 18 11/03/2014 0521   CREATININE 1.73 (H) 10/06/2023 0606   CREATININE 0.87 11/03/2014 0521   CALCIUM 8.7 (L) 10/04/2023 2132   CALCIUM 8.0 (L) 11/03/2014 0521   GFRNONAA 34 (L) 10/06/2023 0606   GFRNONAA >60 11/03/2014 0521   GFRAA 39 (L) 05/29/2019 0831   GFRAA >60 11/03/2014 0521    INR    Component Value Date/Time   INR 1.1 11/03/2020 1406     Intake/Output Summary (Last 24 hours) at 10/06/2023 1340 Last data filed at 10/06/2023 1305 Gross per 24 hour  Intake 1280 ml  Output 900 ml  Net 380 ml     Assessment/Plan:  58 y.o. female is s/p left Open Guillotine left lower extremity amputation just above the ankle. 1 Day Post-Op   PLAN  Vascular surgery plans on taking the patient back to the operating room on Monday, 10/10/2023 for completion BKA or AKA based on the findings of an MRI to her left lower extremity looking to rule out osteomyelitis. Continue wound VAC until Monday, 10/10/2023.  Wound  VAC does not need to be changed until the patient returns to the operating room.  DVT prophylaxis: Heparin 5000 units subcu every 8 hours.   [name] [facility] 10/06/2023 1:40 PM

## 2023-10-06 NOTE — Consult Note (Addendum)
 Pharmacy Antibiotic Note  ASSESSMENT: 57 y.o. female with PMH including T2DM, HLD, PAD, DFI/DFO, cellulitis, osteomyelitis, gangrene, carotid stenosis is presenting with  necrotizing infection of left ankle . Current plan is for patient to undergo amputation and to start antibiotics in the meantime. Pharmacy has been consulted to manage cefepime and vancomycin dosing. Patient's serum creatinine slightly above baseline - will dose vanc by AUC and continue to monitor.   Patient with allergy to penicillins and ceftriaxone in chart. Patient received meropenem around the same time as ceftriaxone and so it is possible meropenem contributed to rash, however temporally more likely ceftriaxone.  After discussion with provider, ceftriaxone reaction deemed to not be convincing and will administer cefepime x 1 and monitor for reaction.  Update: Patient tolerated cefepime dose.  Patient measurements: Height: 5' 2.5" (158.8 cm) Weight: 74.8 kg (165 lb) IBW/kg (Calculated) : 51.25  Vital signs: Temp: 97.6 F (36.4 C) (03/20 0359) BP: 95/54 (03/20 0359) Pulse Rate: 80 (03/20 0359) Recent Labs  Lab 10/04/23 2132 10/06/23 0606  WBC 5.5  --   CREATININE 2.04* 1.73*   Estimated Creatinine Clearance: 34.4 mL/min (A) (by C-G formula based on SCr of 1.73 mg/dL (H)).  Allergies: Allergies  Allergen Reactions   Atorvastatin Hives   Penicillins Swelling, Other (See Comments) and Anaphylaxis    .Has patient had a PCN reaction causing immediate rash, facial/tongue/throat swelling, SOB or lightheadedness with hypotension: No  Has patient had a PCN reaction causing severe rash involving mucus membranes or skin necrosis: No  Has patient had a PCN reaction that required hospitalization: No  Has patient had a PCN reaction occurring within the last 10 years: No  If all of the above answers are "NO", then may proceed with Cephalosporin use.  Around 2014 or earlier, she had severe rash and throat swelling  to penicillins   Codeine Itching and Other (See Comments)   Ceftriaxone Rash   Linezolid Nausea Only    Antimicrobials this admission: Cefepime 3/19 >> Vancomycin 3/19 >> Flagyl >>  Dose adjustments this admission: N/A  Microbiology results: 3/19 Wound cx: in process   PLAN: adjust cefepime from  2 g IV q24H to q12H    Crcl 34.4 ml/min Continue vancomycin 750 mg IV q24H eAUC 488, Cmin 14.6 Scr 1.73, IBW, Vd 0.72 L/kg Follow up culture results to assess for antibiotic optimization. S/p foot amputation 3/19 and tibia hardware removal Monitor renal function to assess for any necessary antibiotic dosing changes. Patient also on metronidazole 500 mg IV q12H   Thank you for allowing pharmacy to be a part of this patient's care.  Bari Mantis PharmD Clinical Pharmacist 10/06/2023

## 2023-10-07 DIAGNOSIS — E118 Type 2 diabetes mellitus with unspecified complications: Secondary | ICD-10-CM | POA: Diagnosis not present

## 2023-10-07 LAB — GLUCOSE, CAPILLARY
Glucose-Capillary: 138 mg/dL — ABNORMAL HIGH (ref 70–99)
Glucose-Capillary: 163 mg/dL — ABNORMAL HIGH (ref 70–99)
Glucose-Capillary: 103 mg/dL — ABNORMAL HIGH (ref 70–99)
Glucose-Capillary: 129 mg/dL — ABNORMAL HIGH (ref 70–99)
Glucose-Capillary: 133 mg/dL — ABNORMAL HIGH (ref 70–99)

## 2023-10-07 LAB — CBC
HCT: 27.2 % — ABNORMAL LOW (ref 36.0–46.0)
Hemoglobin: 8.6 g/dL — ABNORMAL LOW (ref 12.0–15.0)
MCH: 29.2 pg (ref 26.0–34.0)
MCHC: 31.6 g/dL (ref 30.0–36.0)
MCV: 92.2 fL (ref 80.0–100.0)
Platelets: 356 10*3/uL (ref 150–400)
RBC: 2.95 MIL/uL — ABNORMAL LOW (ref 3.87–5.11)
RDW: 13.2 % (ref 11.5–15.5)
WBC: 10.6 10*3/uL — ABNORMAL HIGH (ref 4.0–10.5)
nRBC: 0 % (ref 0.0–0.2)

## 2023-10-07 LAB — BASIC METABOLIC PANEL
Anion gap: 7 (ref 5–15)
BUN: 30 mg/dL — ABNORMAL HIGH (ref 6–20)
CO2: 23 mmol/L (ref 22–32)
Calcium: 8.2 mg/dL — ABNORMAL LOW (ref 8.9–10.3)
Chloride: 113 mmol/L — ABNORMAL HIGH (ref 98–111)
Creatinine, Ser: 1.65 mg/dL — ABNORMAL HIGH (ref 0.44–1.00)
GFR, Estimated: 36 mL/min — ABNORMAL LOW (ref >60)
Glucose, Bld: 121 mg/dL — ABNORMAL HIGH (ref 70–99)
Potassium: 4.2 mmol/L (ref 3.5–5.1)
Sodium: 143 mmol/L (ref 135–145)

## 2023-10-07 LAB — MAGNESIUM: Magnesium: 2.4 mg/dL (ref 1.7–2.4)

## 2023-10-07 LAB — SURGICAL PATHOLOGY

## 2023-10-07 MED ORDER — MECLIZINE HCL 25 MG PO TABS
25.0000 mg | ORAL_TABLET | Freq: Three times a day (TID) | ORAL | Status: DC | PRN
  Filled 2023-10-07: qty 1

## 2023-10-07 NOTE — Progress Notes (Signed)
 Mobility Specialist - Progress Note     10/07/23 1100  Mobility  Activity Stood at bedside;Transferred from bed to chair;Transferred to/from BSC;Dangled on edge of bed  Level of Assistance Minimal assist, patient does 75% or more  Assistive Device Front wheel walker  Distance Ambulated (ft) 8 ft  Range of Motion/Exercises Active  LLE Weight Bearing Per Provider Order NWB  Activity Response Tolerated well  Mobility Referral Yes  Mobility visit 1 Mobility  Mobility Specialist Start Time (ACUTE ONLY) 1112  Mobility Specialist Stop Time (ACUTE ONLY) 1135  Mobility Specialist Time Calculation (min) (ACUTE ONLY) 23 min   Pt resting in bed on RA upon entry. Pt STS and transfers to [facility] CGA/MinA to RW +1. Pt has some vomiting during movement. Pt endorses vomiting with movement everytime she gets up. RN notified. Pt ambulates to recliner from [facility] at the end of bed take 8 hops forward before turning around. Pt left in recliner with needs in reach and chair alarm activated.   Johnathan Hausen Mobility Specialist 10/07/23, 11:38 AM

## 2023-10-07 NOTE — Progress Notes (Signed)
 Progress Note    10/07/2023 2:59 PM 2 Days Post-Op  Subjective:  Donna Fisher is a 58 yo female now POD 2 from left Open Guillotine left lower extremity amputation just above the ankle. Patient is resting comfortably in bed. Wound vac in place and working well. Patient denies any pain. No complaints overnight and vitals all remain stable.    Vitals:   10/07/23 0457 10/07/23 1331  BP: 117/67 (!) 117/52  Pulse: 77 79  Resp: 18 18  Temp: 98.3 F (36.8 C)   SpO2: 100%    Physical Exam: Cardiac:  RRR, Normal S1, S2. No murmurs noted.  Lungs:  Clear on auscultation throughout. No rales, rhonchi and wheezing.  Incisions:  Left lower extremity Guillotine amputation with wound vac in place.  Extremities:  Left lower extremity with Open Amputation. Warm to touch. Right lower extremity with palpable DP/PT pulses. Warm to touch.  Abdomen:  Positive bowel sounds throughout, soft non tender and non distended.  Neurologic: AAOX3, Answers questions and follows commands.  CBC    Component Value Date/Time   WBC 10.6 (H) 10/07/2023 0513   RBC 2.95 (L) 10/07/2023 0513   HGB 8.6 (L) 10/07/2023 0513   HGB 12.2 05/10/2017 1001   HCT 27.2 (L) 10/07/2023 0513   HCT 35.5 05/10/2017 1001   PLT 356 10/07/2023 0513   PLT 267 05/10/2017 1001   MCV 92.2 10/07/2023 0513   MCV 88 05/10/2017 1001   MCV 90 11/03/2014 0521   MCH 29.2 10/07/2023 0513   MCHC 31.6 10/07/2023 0513   RDW 13.2 10/07/2023 0513   RDW 14.9 05/10/2017 1001   RDW 12.3 11/03/2014 0521   LYMPHSABS 0.6 (L) 11/06/2020 0444   LYMPHSABS 3.3 11/03/2014 0521   MONOABS 0.3 11/06/2020 0444   MONOABS 0.7 11/03/2014 0521   EOSABS 0.0 11/06/2020 0444   EOSABS 0.4 11/03/2014 0521   BASOSABS 0.0 11/06/2020 0444   BASOSABS 0.1 11/03/2014 0521    BMET    Component Value Date/Time   NA 143 10/07/2023 0513   NA 136 11/03/2014 0521   K 4.2 10/07/2023 0513   K 3.9 11/03/2014 0521   CL 113 (H) 10/07/2023 0513   CL 105 11/03/2014 0521    CO2 23 10/07/2023 0513   CO2 29 11/03/2014 0521   GLUCOSE 121 (H) 10/07/2023 0513   GLUCOSE 281 (H) 11/03/2014 0521   BUN 30 (H) 10/07/2023 0513   BUN 18 11/03/2014 0521   CREATININE 1.65 (H) 10/07/2023 0513   CREATININE 0.87 11/03/2014 0521   CALCIUM 8.2 (L) 10/07/2023 0513   CALCIUM 8.0 (L) 11/03/2014 0521   GFRNONAA 36 (L) 10/07/2023 0513   GFRNONAA >60 11/03/2014 0521   GFRAA 39 (L) 05/29/2019 0831   GFRAA >60 11/03/2014 0521    INR    Component Value Date/Time   INR 1.1 11/03/2020 1406     Intake/Output Summary (Last 24 hours) at 10/07/2023 1459 Last data filed at 10/07/2023 1034 Gross per 24 hour  Intake 689.82 ml  Output 125 ml  Net 564.82 ml     Assessment/Plan:  58 y.o. female is s/p left Open Guillotine left lower extremity amputation just above the ankle.  2 Days Post-Op   PLAN  Vascular surgery plans on taking the patient back to the operating room on Monday, 10/10/2023 for completion AKA based on the findings of an MRI to her left lower extremity revealing chronic osteomyelitis of her tibia. Continue wound VAC until Monday, 10/10/2023.  Wound VAC does not  need to be changed until the patient returns to the operating room.  I had a long detailed discussion at the bedside this morning with the patient.  We discussed her need for an above-the-knee amputation versus below the knee amputation.  Patient became tearful but understands and wishes to proceed.  We then had a detailed conversation regarding the procedure, recovery time, ability to get a prosthetic, benefits, risks, and complications.  Patient verbalized her understanding.  I answered all of her questions today.  She would like to proceed on Monday as planned.  Patient will be made n.p.o. at midnight Monday.  I have discussed this case in detail with Dr. [name] MD and he agrees with the plan.   DVT prophylaxis: Heparin 5000 units subcu every 8 hours   [name] [facility] 10/07/2023 2:59 PM

## 2023-10-07 NOTE — Plan of Care (Signed)

## 2023-10-07 NOTE — Plan of Care (Signed)
  Problem: Health Behavior/Discharge Planning: Goal: Ability to identify and utilize available resources and services will improve Outcome: Progressing   Problem: Activity: Goal: Risk for activity intolerance will decrease Outcome: Progressing   Problem: Pain Managment: Goal: General experience of comfort will improve and/or be controlled Outcome: Progressing

## 2023-10-07 NOTE — Progress Notes (Signed)
  PROGRESS NOTE    Donna Fisher  HQI:[id number] DOB: 26-Dec-1965 DOA: 10/05/2023 PCP: Abram Sander, MD  [id number]  LOS: 2 days   Brief hospital course:   Assessment & Plan: Donna Fisher is a 58 y.o. female with medical history significant of type 2 diabetes, diabetic neuropathy, stage III chronic kidney disease, who was sent to ED by Dr. Ether Griffins of podiatry after xray showed infection and gas in the left foot.    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 Wyn Quaker and removal of hardware by Dr. Joice Lofts on 3/19 --MRI to her left lower extremity revealing chronic osteomyelitis of her tibia.  --OR on Monday for completion AKA --f/u Surgical cx  --cont vanc, cefepime and Flagyl --cont wound vac   Hypotension --cont home midodrine   DM2 --BG mostly in 100's --hold long-acting insulin for now --ACHS and SSI   AKI on CKD 3b --CR 2.04 on presentation, improved to 1.65 this morning.  Diabetic neuropathy --cont home gabapentin  Dizziness and nausea --worse with movement, likely vertigo --trial meclizine PRN   DVT prophylaxis: Lovenox SQ Code Status: Full code  Family Communication:  Level of care: Med-Surg Dispo:   The patient is from: home Anticipated d/c is to: home Anticipated d/c date is: > 3 days   Subjective and Interval History:  Pt complained of dizziness and nausea when up and moving.   Objective: Vitals:   10/07/23 0457 10/07/23 1331 10/07/23 1547 10/07/23 2115  BP: 117/67 (!) 117/52 132/65 126/69  Pulse: 77 79 81 89  Resp: 18 18 18 20   Temp: 98.3 F (36.8 C)  (!) 97.3 F (36.3 C) 98 F (36.7 C)  TempSrc:   Oral   SpO2: 100%  99% 100%  Weight:      Height:        Intake/Output Summary (Last 24 hours) at 10/07/2023 2140 Last data filed at 10/07/2023 1034 Gross per 24 hour  Intake 120 ml  Output --  Net 120 ml   Filed Weights    10/04/23 2129 10/05/23 0625 10/05/23 1540  Weight: 73.5 kg 73.9 kg 74.8 kg    Examination:   Constitutional: NAD, AAOx3 HEENT: conjunctivae and lids normal, EOMI CV: No cyanosis.   RESP: normal respiratory effort, on RA Extremities: left amputation above the ankle, with wound vac SKIN: warm, dry Neuro: II - XII grossly intact.   Psych: depressed mood and affect.  Appropriate judgement and reason   Data Reviewed: I have personally reviewed labs and imaging studies  Time spent: 50 minutes  Darlin Priestly, MD [facility] If 7PM-7AM, please contact night-coverage 10/07/2023, 9:40 PM

## 2023-10-08 DIAGNOSIS — E118 Type 2 diabetes mellitus with unspecified complications: Secondary | ICD-10-CM | POA: Diagnosis not present

## 2023-10-08 LAB — BASIC METABOLIC PANEL
Anion gap: 2 — ABNORMAL LOW (ref 5–15)
BUN: 20 mg/dL (ref 6–20)
CO2: 26 mmol/L (ref 22–32)
Calcium: 8.5 mg/dL — ABNORMAL LOW (ref 8.9–10.3)
Chloride: 112 mmol/L — ABNORMAL HIGH (ref 98–111)
Creatinine, Ser: 1.34 mg/dL — ABNORMAL HIGH (ref 0.44–1.00)
GFR, Estimated: 46 mL/min — ABNORMAL LOW (ref >60)
Glucose, Bld: 144 mg/dL — ABNORMAL HIGH (ref 70–99)
Potassium: 5.2 mmol/L — ABNORMAL HIGH (ref 3.5–5.1)
Sodium: 140 mmol/L (ref 135–145)

## 2023-10-08 LAB — GLUCOSE, CAPILLARY
Glucose-Capillary: 100 mg/dL — ABNORMAL HIGH (ref 70–99)
Glucose-Capillary: 135 mg/dL — ABNORMAL HIGH (ref 70–99)
Glucose-Capillary: 156 mg/dL — ABNORMAL HIGH (ref 70–99)
Glucose-Capillary: 137 mg/dL — ABNORMAL HIGH (ref 70–99)

## 2023-10-08 MED ORDER — VANCOMYCIN HCL IN DEXTROSE 1-5 GM/200ML-% IV SOLN
1000.0000 mg | INTRAVENOUS | Status: AC
  Administered 2023-10-08 – 2023-10-12 (×4): 1000 mg via INTRAVENOUS
  Filled 2023-10-08 (×4): qty 200

## 2023-10-08 NOTE — Progress Notes (Signed)
  PROGRESS NOTE    Donna Fisher  VWU:[id number] DOB: June 28, 1966 DOA: 10/05/2023 PCP: Abram Sander, MD  [id number]  LOS: 3 days   Brief hospital course:  Donna Fisher is a 58 y.o. female with medical history significant of type 2 diabetes, diabetic neuropathy, stage III chronic kidney disease, who was sent to ED by Dr. Ether Griffins of podiatry after xray showed infection and gas in the left foot.  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 Wyn Quaker and removal of hardware by Dr. Joice Lofts on 3/19 --MRI to her left lower extremity revealing chronic osteomyelitis of her tibia.  --OR on Monday for completion AKA --f/u Surgical cx -currently no growth --cont vanc, cefepime and Flagyl --cont wound vac   Hypotension --cont home midodrine   DM2 --BG mostly in 100's --hold long-acting insulin for now --ACHS and SSI   AKI on CKD 3b --CR 2.04 on presentation, improved to 1.34 this morning.  Diabetic neuropathy --cont home gabapentin  Dizziness and nausea --worse with movement, likely vertigo --trial meclizine PRN   DVT prophylaxis: Lovenox SQ Code Status: Full code  Family Communication:  Level of care: Med-Surg Dispo:   The patient is from: home Anticipated d/c is to: home Anticipated d/c date is: > 3 days   Subjective and Interval History:  Patient was resting comfortably when seen today.  Pain seems well-controlled.  Objective: Vitals:   10/07/23 2115 10/08/23 0325 10/08/23 0729 10/08/23 1100  BP: 126/69 101/87 (!) 113/53 131/66  Pulse: 89 86 85 83  Resp: 20 16 17 18   Temp: 98 F (36.7 C) 97.8 F (36.6 C) 98 F (36.7 C) 97.7 F (36.5 C)  TempSrc:  Oral  Oral  SpO2: 100% 98% 100% 100%  Weight:      Height:        Intake/Output Summary (Last 24 hours) at 10/08/2023 1444 Last data  filed at 10/08/2023 1403 Gross per 24 hour  Intake 240 ml  Output --  Net 240 ml   Filed Weights   10/04/23 2129 10/05/23 0625 10/05/23 1540  Weight: 73.5 kg 73.9 kg 74.8 kg    Examination:   General.  Well-developed lady, in no acute distress. Pulmonary.  Lungs clear bilaterally, normal respiratory effort. CV.  Regular rate and rhythm, no JVD, rub or murmur. Abdomen.  Soft, nontender, nondistended, BS positive. CNS.  Alert and oriented .  No focal neurologic deficit. Extremities.  Left BKA with wound VAC. Psychiatry.  Judgment and insight appears normal.    Data Reviewed: I have personally reviewed labs and imaging studies  Time spent: 50 minutes  This record has been created using Conservation officer, historic buildings. Errors have been sought and corrected,but may not always be located. Such creation errors do not reflect on the standard of care.   Arnetha Courser, MD [facility] If 7PM-7AM, please contact night-coverage 10/08/2023, 2:44 PM

## 2023-10-08 NOTE — Consult Note (Signed)
 Pharmacy Antibiotic Note  ASSESSMENT: 58 y.o. female with PMH including T2DM, HLD, PAD, DFI/DFO, cellulitis, osteomyelitis, gangrene, carotid stenosis is presenting with  necrotizing infection of left ankle . Current plan is for patient to undergo amputation and to start antibiotics in the meantime. Pharmacy has been consulted to manage cefepime and vancomycin dosing. Patient's serum creatinine slightly above baseline - will dose vanc by AUC and continue to monitor.   Patient with allergy to penicillins and ceftriaxone in chart. Patient received meropenem around the same time as ceftriaxone and so it is possible meropenem contributed to rash, however temporally more likely ceftriaxone.  After discussion with provider, ceftriaxone reaction deemed to not be convincing and will administer cefepime x 1 and monitor for reaction.  Update: Patient tolerated cefepime dose.  Patient measurements: Height: 5' 2.5" (158.8 cm) Weight: 74.8 kg (165 lb) IBW/kg (Calculated) : 51.25  Vital signs: Temp: 97.7 F (36.5 C) (03/22 1100) Temp Source: Oral (03/22 1100) BP: 131/66 (03/22 1100) Pulse Rate: 83 (03/22 1100) Recent Labs  Lab 10/04/23 2132 10/06/23 0606 10/07/23 0513 10/08/23 1001  WBC 5.5  --  10.6*  --   CREATININE 2.04* 1.73* 1.65* 1.34*   Estimated Creatinine Clearance: 44.4 mL/min (A) (by C-G formula based on SCr of 1.34 mg/dL (H)).  Allergies: Allergies  Allergen Reactions   Atorvastatin Hives   Penicillins Swelling, Other (See Comments) and Anaphylaxis    .Has patient had a PCN reaction causing immediate rash, facial/tongue/throat swelling, SOB or lightheadedness with hypotension: No  Has patient had a PCN reaction causing severe rash involving mucus membranes or skin necrosis: No  Has patient had a PCN reaction that required hospitalization: No  Has patient had a PCN reaction occurring within the last 10 years: No  If all of the above answers are "NO", then may proceed with  Cephalosporin use.  Around 2014 or earlier, she had severe rash and throat swelling to penicillins   Codeine Itching and Other (See Comments)   Ceftriaxone Rash   Linezolid Nausea Only    Antimicrobials this admission: Cefepime 3/19 >> Vancomycin 3/19 >> Flagyl >>  Dose adjustments this admission: N/A  Microbiology results: 3/19 Wound cx: in process   PLAN: -  continue cefepime from  2 g IV q12H    Crcl 44.4 ml/min adjust vancomycin from 750 mg to 1000 mg IV q24H eAUC 523, Cmin 14.3 Scr 134, IBW, Vd 0.72 L/kg Follow up culture results to assess for antibiotic optimization. S/p foot amputation 3/19 and tibia hardware removal Monitor renal function to assess for any necessary antibiotic dosing changes. Patient also on metronidazole 500 mg IV q12H   Thank you for allowing pharmacy to be a part of this patient's care.  Bari Mantis PharmD Clinical Pharmacist 10/08/2023

## 2023-10-08 NOTE — Plan of Care (Signed)

## 2023-10-08 NOTE — Progress Notes (Signed)
 Mobility Specialist - Progress Note    10/08/23 1518  Mobility  Activity Transferred to/from [facility];Transferred from chair to bed  Level of Assistance Contact guard assist, steadying assist  Assistive Device Front wheel walker  Distance Ambulated (ft) 3 ft  Range of Motion/Exercises Right leg;Left arm;Right arm  LLE Weight Bearing Per Provider Order NWB  Activity Response Tolerated well  Mobility Referral Yes  Mobility visit 1 Mobility  Mobility Specialist Start Time (ACUTE ONLY) 1057  Mobility Specialist Stop Time (ACUTE ONLY) 1118  Mobility Specialist Time Calculation (min) (ACUTE ONLY) 21 min   Pt resting in bed on RA upon entry. Pt STS and transfers to [facility]  CGA with RW. Pt transfers back to bed and performs x5 up downs with RW. Pt remained sitting EOB and left with needs in reach.   Johnathan Hausen Mobility Specialist 10/08/23, 3:50 PM

## 2023-10-08 NOTE — Progress Notes (Addendum)
      Daily Progress Note   Assessment/Planning:   POD #3 s/p L Guillotine BKA  VAC adherent Completion AKA on Monday Preop entered   Subjective  - 3 Days Post-Op   Pain tolerable   Objective   Vitals:   10/07/23 1331 10/07/23 1547 10/07/23 2115 10/08/23 0325  BP: (!) 117/52 132/65 126/69 101/87  Pulse: 79 81 89 86  Resp: 18 18 20 16   Temp:  (!) 97.3 F (36.3 C) 98 F (36.7 C) 97.8 F (36.6 C)  TempSrc:  Oral  Oral  SpO2:  99% 100% 98%  Weight:      Height:         Intake/Output Summary (Last 24 hours) at 10/08/2023 0653 Last data filed at 10/07/2023 1034 Gross per 24 hour  Intake 0 ml  Output --  Net 0 ml    EXT L distal calf: adherent VAC    Laboratory   CBC    Latest Ref Rng & Units 10/07/2023    5:13 AM 10/04/2023    9:32 PM 08/22/2023    3:16 PM  CBC  WBC 4.0 - 10.5 K/uL 10.6  5.5  10.2   Hemoglobin 12.0 - 15.0 g/dL 8.6  13.2  44.0   Hematocrit 36.0 - 46.0 % 27.2  35.7  36.3   Platelets 150 - 400 K/uL 356  356  333     BMET    Component Value Date/Time   NA 143 10/07/2023 0513   NA 136 11/03/2014 0521   K 4.2 10/07/2023 0513   K 3.9 11/03/2014 0521   CL 113 (H) 10/07/2023 0513   CL 105 11/03/2014 0521   CO2 23 10/07/2023 0513   CO2 29 11/03/2014 0521   GLUCOSE 121 (H) 10/07/2023 0513   GLUCOSE 281 (H) 11/03/2014 0521   BUN 30 (H) 10/07/2023 0513   BUN 18 11/03/2014 0521   CREATININE 1.65 (H) 10/07/2023 0513   CREATININE 0.87 11/03/2014 0521   CALCIUM 8.2 (L) 10/07/2023 0513   CALCIUM 8.0 (L) 11/03/2014 0521   GFRNONAA 36 (L) 10/07/2023 0513   GFRNONAA >60 11/03/2014 0521   GFRAA 39 (L) 05/29/2019 0831   GFRAA >60 11/03/2014 1027     Leonides Sake, MD, FACS, FSVS Covering for [facility]: [phone]  10/08/2023, 6:53 AM

## 2023-10-09 DIAGNOSIS — E118 Type 2 diabetes mellitus with unspecified complications: Secondary | ICD-10-CM | POA: Diagnosis not present

## 2023-10-09 LAB — CREATININE, SERUM
Creatinine, Ser: 1.26 mg/dL — ABNORMAL HIGH (ref 0.44–1.00)
GFR, Estimated: 50 mL/min — ABNORMAL LOW (ref >60)

## 2023-10-09 LAB — GLUCOSE, CAPILLARY
Glucose-Capillary: 169 mg/dL — ABNORMAL HIGH (ref 70–99)
Glucose-Capillary: 117 mg/dL — ABNORMAL HIGH (ref 70–99)
Glucose-Capillary: 154 mg/dL — ABNORMAL HIGH (ref 70–99)
Glucose-Capillary: 156 mg/dL — ABNORMAL HIGH (ref 70–99)

## 2023-10-09 MED ORDER — NYSTATIN 100000 UNIT/ML MT SUSP
5.0000 mL | Freq: Four times a day (QID) | OROMUCOSAL | Status: DC
  Administered 2023-10-09 (×2): 500000 [IU] via ORAL
  Filled 2023-10-09 (×7): qty 5

## 2023-10-09 NOTE — Plan of Care (Signed)

## 2023-10-09 NOTE — Progress Notes (Signed)
 Mobility Specialist - Progress Note    10/09/23 1521  Mobility  Activity Stood at bedside;Ambulated with assistance in room;Dangled on edge of bed  Level of Assistance Standby assist, set-up cues, supervision of patient - no hands on  Assistive Device Front wheel walker  Distance Ambulated (ft) 20 ft  Range of Motion/Exercises Active  LLE Weight Bearing Per Provider Order NWB  Activity Response Tolerated well  Mobility Referral Yes  Mobility visit 1 Mobility  Mobility Specialist Start Time (ACUTE ONLY) 1504  Mobility Specialist Stop Time (ACUTE ONLY) 1526  Mobility Specialist Time Calculation (min) (ACUTE ONLY) 22 min   Pt resting in bed on RA upon entry. Pt STS and ambulates (hopped 7 times forward and 7 times backward) x2. Pt then performed x7 up downs to RW. Pt endorses no new pain and remains very motivated to participate in mobility. Pt sat EOB for 5 minutes before returning to bed and left with needs in reach.   Johnathan Hausen Mobility Specialist 10/09/23, 3:45 PM

## 2023-10-09 NOTE — Plan of Care (Signed)

## 2023-10-09 NOTE — Progress Notes (Signed)
      Daily Progress Note   Assessment/Planning:   POD #4 s/p L guillotine BKA  VAC adherent  Preop orders placed Will type and cross 2 u PRBC as precaution Consent ordered All questions answered   Subjective  - 4 Days Post-Op   Pain tolerated   Objective   Vitals:   10/08/23 1100 10/08/23 1758 10/08/23 2241 10/09/23 0441  BP: 131/66 122/62 123/61 139/79  Pulse: 83 90 87 86  Resp: 18   16  Temp: 97.7 F (36.5 C) 98.1 F (36.7 C) 98.5 F (36.9 C) 98.3 F (36.8 C)  TempSrc: Oral Oral Oral Oral  SpO2: 100% 96% 95% 97%  Weight:      Height:         Intake/Output Summary (Last 24 hours) at 10/09/2023 0916 Last data filed at 10/08/2023 1403 Gross per 24 hour  Intake 240 ml  Output --  Net 240 ml    VASC L distal calf: VAC adherent    Laboratory   CBC    Latest Ref Rng & Units 10/07/2023    5:13 AM 10/04/2023    9:32 PM 08/22/2023    3:16 PM  CBC  WBC 4.0 - 10.5 K/uL 10.6  5.5  10.2   Hemoglobin 12.0 - 15.0 g/dL 8.6  81.1  91.4   Hematocrit 36.0 - 46.0 % 27.2  35.7  36.3   Platelets 150 - 400 K/uL 356  356  333     BMET    Component Value Date/Time   NA 140 10/08/2023 1001   NA 136 11/03/2014 0521   K 5.2 (H) 10/08/2023 1001   K 3.9 11/03/2014 0521   CL 112 (H) 10/08/2023 1001   CL 105 11/03/2014 0521   CO2 26 10/08/2023 1001   CO2 29 11/03/2014 0521   GLUCOSE 144 (H) 10/08/2023 1001   GLUCOSE 281 (H) 11/03/2014 0521   BUN 20 10/08/2023 1001   BUN 18 11/03/2014 0521   CREATININE 1.26 (H) 10/09/2023 0430   CREATININE 0.87 11/03/2014 0521   CALCIUM 8.5 (L) 10/08/2023 1001   CALCIUM 8.0 (L) 11/03/2014 0521   GFRNONAA 50 (L) 10/09/2023 0430   GFRNONAA >60 11/03/2014 0521   GFRAA 39 (L) 05/29/2019 0831   GFRAA >60 11/03/2014 7829     Leonides Sake, MD, FACS, FSVS Covering for [facility]: [phone]  10/09/2023, 9:16 AM

## 2023-10-09 NOTE — H&P (View-Only) (Signed)
      Daily Progress Note   Assessment/Planning:   POD #4 s/p L guillotine BKA  VAC adherent  Preop orders placed Will type and cross 2 u PRBC as precaution Consent ordered All questions answered   Subjective  - 4 Days Post-Op   Pain tolerated   Objective   Vitals:   10/08/23 1100 10/08/23 1758 10/08/23 2241 10/09/23 0441  BP: 131/66 122/62 123/61 139/79  Pulse: 83 90 87 86  Resp: 18   16  Temp: 97.7 F (36.5 C) 98.1 F (36.7 C) 98.5 F (36.9 C) 98.3 F (36.8 C)  TempSrc: Oral Oral Oral Oral  SpO2: 100% 96% 95% 97%  Weight:      Height:         Intake/Output Summary (Last 24 hours) at 10/09/2023 0916 Last data filed at 10/08/2023 1403 Gross per 24 hour  Intake 240 ml  Output --  Net 240 ml    VASC L distal calf: VAC adherent    Laboratory   CBC    Latest Ref Rng & Units 10/07/2023    5:13 AM 10/04/2023    9:32 PM 08/22/2023    3:16 PM  CBC  WBC 4.0 - 10.5 K/uL 10.6  5.5  10.2   Hemoglobin 12.0 - 15.0 g/dL 8.6  81.1  91.4   Hematocrit 36.0 - 46.0 % 27.2  35.7  36.3   Platelets 150 - 400 K/uL 356  356  333     BMET    Component Value Date/Time   NA 140 10/08/2023 1001   NA 136 11/03/2014 0521   K 5.2 (H) 10/08/2023 1001   K 3.9 11/03/2014 0521   CL 112 (H) 10/08/2023 1001   CL 105 11/03/2014 0521   CO2 26 10/08/2023 1001   CO2 29 11/03/2014 0521   GLUCOSE 144 (H) 10/08/2023 1001   GLUCOSE 281 (H) 11/03/2014 0521   BUN 20 10/08/2023 1001   BUN 18 11/03/2014 0521   CREATININE 1.26 (H) 10/09/2023 0430   CREATININE 0.87 11/03/2014 0521   CALCIUM 8.5 (L) 10/08/2023 1001   CALCIUM 8.0 (L) 11/03/2014 0521   GFRNONAA 50 (L) 10/09/2023 0430   GFRNONAA >60 11/03/2014 0521   GFRAA 39 (L) 05/29/2019 0831   GFRAA >60 11/03/2014 7829     Leonides Sake, MD, FACS, FSVS Covering for [facility]: [phone]  10/09/2023, 9:16 AM

## 2023-10-09 NOTE — Progress Notes (Signed)
  PROGRESS NOTE    Donna Fisher  [id number]:[id number] DOB: Jun 30, 1966 DOA: 10/05/2023 PCP: Abram Sander, MD  122A/122A-BB  LOS: 4 days   Brief hospital course:  Donna Fisher is a 58 y.o. female with medical history significant of type 2 diabetes, diabetic neuropathy, stage III chronic kidney disease, who was sent to ED by Dr. Ether Griffins of podiatry after xray showed infection and gas in the left foot.  3/22: Hemodynamically stable, AKA on Monday  3/23: Remains stable, improving creatinine.  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 Wyn Quaker and removal of hardware by Dr. Joice Lofts on 3/19 --MRI to her left lower extremity revealing chronic osteomyelitis of her tibia.  --OR on Monday for completion AKA --f/u Surgical cx -currently no growth --cont vanc, cefepime and Flagyl --cont wound vac   Hypotension --cont home midodrine   DM2 --BG mostly in 100's --hold long-acting insulin for now --ACHS and SSI   AKI on CKD 3b --CR 2.04 on presentation, improved to 1.26 this morning.  Diabetic neuropathy --cont home gabapentin  Dizziness and nausea --worse with movement, likely vertigo --trial meclizine PRN   DVT prophylaxis: Lovenox SQ Code Status: Full code  Family Communication:  Level of care: Med-Surg Dispo:   The patient is from: home Anticipated d/c is to: home Anticipated d/c date is: > 3 days   Subjective and Interval History:  Patient was eating breakfast when seen today.  Pain seems well-controlled.  Objective: Vitals:   10/08/23 1758 10/08/23 2241 10/09/23 0441 10/09/23 0924  BP: 122/62 123/61 139/79 (!) 141/74  Pulse: 90 87 86 90  Resp:   16 15  Temp: 98.1 F (36.7 C) 98.5 F (36.9 C) 98.3 F (36.8 C) 98.1 F (36.7 C)  TempSrc: Oral Oral Oral   SpO2: 96% 95% 97% 91%  Weight:       Height:        Intake/Output Summary (Last 24 hours) at 10/09/2023 1542 Last data filed at 10/09/2023 1424 Gross per 24 hour  Intake 0 ml  Output --  Net 0 ml   Filed Weights   10/04/23 2129 10/05/23 0625 10/05/23 1540  Weight: 73.5 kg 73.9 kg 74.8 kg    Examination:   General.  Well-developed lady, in no acute distress. Pulmonary.  Lungs clear bilaterally, normal respiratory effort. CV.  Regular rate and rhythm, no JVD, rub or murmur. Abdomen.  Soft, nontender, nondistended, BS positive. CNS.  Alert and oriented .  No focal neurologic deficit. Extremities.  Left BKA with wound VAC. Psychiatry.  Judgment and insight appears normal.     Data Reviewed: I have personally reviewed labs and imaging studies  Time spent: 45 minutes  This record has been created using Conservation officer, historic buildings. Errors have been sought and corrected,but may not always be located. Such creation errors do not reflect on the standard of care.   Arnetha Courser, MD [facility] If 7PM-7AM, please contact night-coverage 10/09/2023, 3:42 PM

## 2023-10-10 ENCOUNTER — Inpatient Hospital Stay: Admitting: Certified Registered Nurse Anesthetist

## 2023-10-10 ENCOUNTER — Encounter
Admission: EM | Disposition: A | Discharge status: Home Health | Payer: Self-pay | Source: Home / Self Care | Attending: Internal Medicine

## 2023-10-10 ENCOUNTER — Other Ambulatory Visit: Payer: Self-pay

## 2023-10-10 DIAGNOSIS — M869 Osteomyelitis, unspecified: Secondary | ICD-10-CM

## 2023-10-10 DIAGNOSIS — A419 Sepsis, unspecified organism: Secondary | ICD-10-CM

## 2023-10-10 DIAGNOSIS — E118 Type 2 diabetes mellitus with unspecified complications: Secondary | ICD-10-CM | POA: Diagnosis not present

## 2023-10-10 HISTORY — PX: AMPUTATION: SHX166

## 2023-10-10 LAB — CBC
HCT: 25.4 % — ABNORMAL LOW (ref 36.0–46.0)
Hemoglobin: 8.2 g/dL — ABNORMAL LOW (ref 12.0–15.0)
MCH: 29.2 pg (ref 26.0–34.0)
MCHC: 32.3 g/dL (ref 30.0–36.0)
MCV: 90.4 fL (ref 80.0–100.0)
Platelets: 258 10*3/uL (ref 150–400)
RBC: 2.81 MIL/uL — ABNORMAL LOW (ref 3.87–5.11)
RDW: 13.2 % (ref 11.5–15.5)
WBC: 8.5 10*3/uL (ref 4.0–10.5)
nRBC: 0 % (ref 0.0–0.2)

## 2023-10-10 LAB — BASIC METABOLIC PANEL
Anion gap: 7 (ref 5–15)
BUN: 15 mg/dL (ref 6–20)
CO2: 26 mmol/L (ref 22–32)
Calcium: 8.2 mg/dL — ABNORMAL LOW (ref 8.9–10.3)
Chloride: 107 mmol/L (ref 98–111)
Creatinine, Ser: 1.14 mg/dL — ABNORMAL HIGH (ref 0.44–1.00)
GFR, Estimated: 56 mL/min — ABNORMAL LOW (ref >60)
Glucose, Bld: 99 mg/dL (ref 70–99)
Potassium: 3.8 mmol/L (ref 3.5–5.1)
Sodium: 140 mmol/L (ref 135–145)

## 2023-10-10 LAB — MAGNESIUM: Magnesium: 2.2 mg/dL (ref 1.7–2.4)

## 2023-10-10 LAB — AEROBIC/ANAEROBIC CULTURE W GRAM STAIN (SURGICAL/DEEP WOUND)
Culture: NORMAL
Culture: NO GROWTH

## 2023-10-10 LAB — GLUCOSE, CAPILLARY
Glucose-Capillary: 130 mg/dL — ABNORMAL HIGH (ref 70–99)
Glucose-Capillary: 129 mg/dL — ABNORMAL HIGH (ref 70–99)
Glucose-Capillary: 129 mg/dL — ABNORMAL HIGH (ref 70–99)
Glucose-Capillary: 140 mg/dL — ABNORMAL HIGH (ref 70–99)
Glucose-Capillary: 106 mg/dL — ABNORMAL HIGH (ref 70–99)

## 2023-10-10 SURGERY — AMPUTATION, ABOVE KNEE
Anesthesia: General | Site: Knee | Laterality: Left

## 2023-10-10 MED ORDER — FENTANYL CITRATE (PF) 100 MCG/2ML IJ SOLN
INTRAMUSCULAR | Status: DC | PRN
  Administered 2023-10-10 (×2): 50 ug via INTRAVENOUS

## 2023-10-10 MED ORDER — OXYCODONE HCL 5 MG PO TABS
ORAL_TABLET | ORAL | Status: AC
  Filled 2023-10-10: qty 1

## 2023-10-10 MED ORDER — METHOCARBAMOL 500 MG PO TABS
500.0000 mg | ORAL_TABLET | Freq: Four times a day (QID) | ORAL | Status: DC | PRN
  Administered 2023-10-10 – 2023-10-12 (×2): 500 mg via ORAL
  Filled 2023-10-10 (×3): qty 1

## 2023-10-10 MED ORDER — SUGAMMADEX SODIUM 200 MG/2ML IV SOLN
INTRAVENOUS | Status: DC | PRN
  Administered 2023-10-10: 200 mg via INTRAVENOUS

## 2023-10-10 MED ORDER — MIDAZOLAM HCL 2 MG/2ML IJ SOLN
INTRAMUSCULAR | Status: AC
  Filled 2023-10-10: qty 2

## 2023-10-10 MED ORDER — FENTANYL CITRATE (PF) 100 MCG/2ML IJ SOLN
INTRAMUSCULAR | Status: AC
  Filled 2023-10-10: qty 2

## 2023-10-10 MED ORDER — LIDOCAINE HCL (CARDIAC) PF 100 MG/5ML IV SOSY
PREFILLED_SYRINGE | INTRAVENOUS | Status: DC | PRN
Start: 2023-10-10 — End: 2023-10-10
  Administered 2023-10-10: 80 mg via INTRAVENOUS

## 2023-10-10 MED ORDER — DROPERIDOL 2.5 MG/ML IJ SOLN: 0.6250 mg | Freq: Once | INTRAMUSCULAR | Status: DC | PRN

## 2023-10-10 MED ORDER — ROCURONIUM BROMIDE 10 MG/ML (PF) SYRINGE
PREFILLED_SYRINGE | INTRAVENOUS | Status: AC
  Filled 2023-10-10: qty 10

## 2023-10-10 MED ORDER — OXYCODONE HCL 5 MG PO TABS
5.0000 mg | ORAL_TABLET | Freq: Once | ORAL | Status: AC | PRN
  Administered 2023-10-10: 5 mg via ORAL

## 2023-10-10 MED ORDER — ACETAMINOPHEN 10 MG/ML IV SOLN: 1000.0000 mg | Freq: Once | INTRAVENOUS | Status: DC | PRN

## 2023-10-10 MED ORDER — EPHEDRINE SULFATE-NACL 50-0.9 MG/10ML-% IV SOSY
PREFILLED_SYRINGE | INTRAVENOUS | Status: DC | PRN
Start: 2023-10-10 — End: 2023-10-10
  Administered 2023-10-10 (×2): 5 mg via INTRAVENOUS

## 2023-10-10 MED ORDER — OXYCODONE HCL 5 MG/5ML PO SOLN: 5.0000 mg | Freq: Once | ORAL | Status: AC | PRN

## 2023-10-10 MED ORDER — VASHE WOUND IRRIGATION OPTIME
TOPICAL | Status: DC | PRN
Start: 2023-10-10 — End: 2023-10-10
  Administered 2023-10-10: 34 [oz_av]

## 2023-10-10 MED ORDER — PHENYLEPHRINE 80 MCG/ML (10ML) SYRINGE FOR IV PUSH (FOR BLOOD PRESSURE SUPPORT)
PREFILLED_SYRINGE | INTRAVENOUS | Status: DC | PRN
  Administered 2023-10-10 (×2): 160 ug via INTRAVENOUS
  Administered 2023-10-10: 80 ug via INTRAVENOUS
  Administered 2023-10-10 (×2): 160 ug via INTRAVENOUS

## 2023-10-10 MED ORDER — ACETAMINOPHEN 10 MG/ML IV SOLN
INTRAVENOUS | Status: DC | PRN
  Administered 2023-10-10: 1000 mg via INTRAVENOUS

## 2023-10-10 MED ORDER — SUGAMMADEX SODIUM 200 MG/2ML IV SOLN
INTRAVENOUS | Status: AC
  Filled 2023-10-10: qty 2

## 2023-10-10 MED ORDER — FENTANYL CITRATE (PF) 100 MCG/2ML IJ SOLN
25.0000 ug | INTRAMUSCULAR | Status: DC | PRN
  Administered 2023-10-10 (×4): 25 ug via INTRAVENOUS

## 2023-10-10 MED ORDER — ALBUMIN HUMAN 5 % IV SOLN
INTRAVENOUS | Status: AC
  Filled 2023-10-10: qty 250

## 2023-10-10 MED ORDER — MIDAZOLAM HCL 2 MG/2ML IJ SOLN
INTRAMUSCULAR | Status: DC | PRN
  Administered 2023-10-10: 2 mg via INTRAVENOUS

## 2023-10-10 MED ORDER — SODIUM CHLORIDE 0.9 % IV SOLN: INTRAVENOUS | Status: DC

## 2023-10-10 MED ORDER — PROPOFOL 10 MG/ML IV BOLUS
INTRAVENOUS | Status: AC
  Filled 2023-10-10: qty 20

## 2023-10-10 MED ORDER — ACETAMINOPHEN 10 MG/ML IV SOLN
INTRAVENOUS | Status: AC
  Filled 2023-10-10: qty 100

## 2023-10-10 MED ORDER — PROPOFOL 10 MG/ML IV BOLUS
INTRAVENOUS | Status: DC | PRN
  Administered 2023-10-10: 140 mg via INTRAVENOUS
  Administered 2023-10-10: 50 mg via INTRAVENOUS

## 2023-10-10 MED ORDER — ONDANSETRON HCL 4 MG/2ML IJ SOLN
INTRAMUSCULAR | Status: DC | PRN
  Administered 2023-10-10: 4 mg via INTRAVENOUS

## 2023-10-10 MED ORDER — ROCURONIUM BROMIDE 100 MG/10ML IV SOLN
INTRAVENOUS | Status: DC | PRN
  Administered 2023-10-10: 40 mg via INTRAVENOUS

## 2023-10-10 SURGICAL SUPPLY — 34 items
BLADE SAGITTAL WIDE XTHICK NO (BLADE) ×1 IMPLANT
BNDG COHESIVE 4X5 TAN STRL LF (GAUZE/BANDAGES/DRESSINGS) ×1 IMPLANT
BNDG ELASTIC 6X5.8 VLCR NS LF (GAUZE/BANDAGES/DRESSINGS) ×1 IMPLANT
BNDG GAUZE DERMACEA FLUFF 4 (GAUZE/BANDAGES/DRESSINGS) ×2 IMPLANT
BRUSH SCRUB EZ 4% CHG (MISCELLANEOUS) ×1 IMPLANT
CHLORAPREP W/TINT 26 (MISCELLANEOUS) ×1 IMPLANT
CLEANSER WND VASHE 34 (WOUND CARE) IMPLANT
DRAPE INCISE IOBAN 66X45 STRL (DRAPES) ×1 IMPLANT
DRAPE INCISE IOBAN 66X60 STRL (DRAPES) ×1 IMPLANT
ELECT CAUTERY BLADE 6.4 (BLADE) ×1 IMPLANT
ELECT REM PT RETURN 9FT ADLT (ELECTROSURGICAL) ×1 IMPLANT
ELECTRODE REM PT RTRN 9FT ADLT (ELECTROSURGICAL) ×1 IMPLANT
GAUZE XEROFORM 1X8 LF (GAUZE/BANDAGES/DRESSINGS) ×2 IMPLANT
GLOVE BIO SURGEON STRL SZ7 (GLOVE) ×2 IMPLANT
GOWN STRL REUS W/ TWL LRG LVL3 (GOWN DISPOSABLE) ×2 IMPLANT
GOWN STRL REUS W/TWL 2XL LVL3 (GOWN DISPOSABLE) ×2 IMPLANT
HANDLE YANKAUER SUCT BULB TIP (MISCELLANEOUS) ×1 IMPLANT
KIT TURNOVER KIT A (KITS) ×1 IMPLANT
LABEL OR SOLS (LABEL) ×1 IMPLANT
MANIFOLD NEPTUNE II (INSTRUMENTS) ×1 IMPLANT
MAT ABSORB FLUID 56X50 GRAY (MISCELLANEOUS) ×1 IMPLANT
NS IRRIG 1000ML POUR BTL (IV SOLUTION) ×1 IMPLANT
PACK EXTREMITY ARMC (MISCELLANEOUS) ×1 IMPLANT
PAD ABD DERMACEA PRESS 5X9 (GAUZE/BANDAGES/DRESSINGS) ×2 IMPLANT
PAD PREP OB/GYN DISP 24X41 (PERSONAL CARE ITEMS) ×1 IMPLANT
SPONGE T-LAP 18X18 ~~LOC~~+RFID (SPONGE) ×2 IMPLANT
STAPLER SKIN PROX 35W (STAPLE) ×1 IMPLANT
STOCKINETTE M/LG 89821 (MISCELLANEOUS) ×1 IMPLANT
SUT SILK 2 0 SH (SUTURE) ×2 IMPLANT
SUT SILK 2-0 18XBRD TIE 12 (SUTURE) ×1 IMPLANT
SUT VIC AB 0 CT1 36 (SUTURE) ×2 IMPLANT
SUT VIC AB 2-0 CT1 (SUTURE) ×2 IMPLANT
TRAP FLUID SMOKE EVACUATOR (MISCELLANEOUS) ×1 IMPLANT
WATER STERILE IRR 500ML POUR (IV SOLUTION) ×1 IMPLANT

## 2023-10-10 NOTE — Progress Notes (Signed)
  PROGRESS NOTE    Donna Fisher  ZOX:[id number] DOB: 18-Dec-1965 DOA: 10/05/2023 PCP: Abram Sander, MD  122A/122A-BB  LOS: 5 days   Brief hospital course:  Donna Fisher is a 58 y.o. female with medical history significant of type 2 diabetes, diabetic neuropathy, stage III chronic kidney disease, who was sent to ED by Dr. Ether Griffins of podiatry after xray showed infection and gas in the left foot.  3/22: Hemodynamically stable, AKA on Monday  3/23: Remains stable, improving creatinine.  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 Wyn Quaker and removal of hardware by Dr. Joice Lofts on 3/19 --MRI to her left lower extremity revealing chronic osteomyelitis of her tibia.  --OR today for completion AKA --f/u Surgical cx -currently no growth --cont vanc, cefepime and Flagyl --cont wound vac   Hypotension --cont home midodrine   DM2 --BG mostly in 100's --hold long-acting insulin for now --ACHS and SSI   AKI on CKD 3b --CR 2.04 on presentation, improved to 1.14 this morning.  Diabetic neuropathy --cont home gabapentin  Dizziness and nausea --worse with movement, likely vertigo --trial meclizine PRN   DVT prophylaxis: Lovenox SQ Code Status: Full code  Family Communication:  Level of care: Med-Surg Dispo:   The patient is from: home Anticipated d/c is to: home Anticipated d/c date is: > 3 days   Subjective and Interval History:  Patient with no new concern.  Going to the OR for AKA  Objective: Vitals:   10/10/23 1250 10/10/23 1300 10/10/23 1315 10/10/23 1330  BP:  104/66 110/70 100/85  Pulse: 97 97 99 98  Resp: 14 14 12 14   Temp:    (!) 97.5 F (36.4 C)  TempSrc:      SpO2: 96% 96% 96% 98%  Weight:      Height:        Intake/Output Summary (Last 24 hours) at  10/10/2023 1551 Last data filed at 10/10/2023 1155 Gross per 24 hour  Intake 1000 ml  Output 300 ml  Net 700 ml   Filed Weights   10/05/23 0625 10/05/23 1540 10/10/23 1000  Weight: 73.9 kg 74.8 kg 74.8 kg    Examination:   General.  Well-developed lady, in no acute distress. Pulmonary.  Lungs clear bilaterally, normal respiratory effort. CV.  Regular rate and rhythm, no JVD, rub or murmur. Abdomen.  Soft, nontender, nondistended, BS positive. CNS.  Alert and oriented .  No focal neurologic deficit. Extremities.  Left BKA with wound VAC Psychiatry.  Judgment and insight appears normal.    Data Reviewed: I have personally reviewed labs and imaging studies  Time spent: 40 minutes  This record has been created using Conservation officer, historic buildings. Errors have been sought and corrected,but may not always be located. Such creation errors do not reflect on the standard of care.   Arnetha Courser, MD [facility] If 7PM-7AM, please contact night-coverage 10/10/2023, 3:51 PM

## 2023-10-10 NOTE — TOC Initial Note (Signed)
 Transition of Care [facility]) - Initial/Assessment Note    Patient Details  Name: Donna Fisher MRN: [id number] Date of Birth: [birth year]  Transition of Care [facility] CM/SW Contact:    Cherre Blanc, RN Phone Number: 10/10/2023, 3:18 PM  Clinical Narrative:                 TOC spoke with patient in her room. Patient is from home with her husband. Her husband will transport her home when she is medically clear for dc. The patient uses a pill box organizer and takes her medications independently. She has a PCP that she sees.She has a rollator, wheel chair, RW, BSC, and shower chair at home. The plan is for her to dc to home with [facility] PT/OT. TOC will continue to follow.  Expected Discharge Plan: Home w Home Health Services Barriers to Discharge: Continued Medical Work up   Patient Goals and CMS Choice            Expected Discharge Plan and Services   Discharge Planning Services: CM Consult   Living arrangements for the past 2 months: Single Family Home                           HH Arranged: OT, PT HH Agency: Other - See comment Cindie Laroche) Date HH Agency Contacted: 10/10/23 Time HH Agency Contacted: 1517 Representative spoke with at [facility] Agency: [place] [phone]  Prior Living Arrangements/Services Living arrangements for the past 2 months: Single Family Home Lives with:: Spouse              Current home services: DME (Rollator, RW, BSC, Shower chair, motion lights)    Activities of Daily Living   ADL Screening (condition at time of admission) Independently performs ADLs?: No Does the patient have a NEW difficulty with bathing/dressing/toileting/self-feeding that is expected to last >3 days?: Yes (Initiates electronic notice to provider for possible OT consult) Does the patient have a NEW difficulty with getting in/out of bed, walking, or climbing stairs that is expected to last >3 days?: Yes (Initiates electronic notice to provider for possible PT consult) Does the  patient have a NEW difficulty with communication that is expected to last >3 days?: No Is the patient deaf or have difficulty hearing?: No Does the patient have difficulty seeing, even when wearing glasses/contacts?: No Does the patient have difficulty concentrating, remembering, or making decisions?: No  Permission Sought/Granted                  Emotional Assessment Appearance:: Appears stated age Attitude/Demeanor/Rapport: Guarded Affect (typically observed): Flat Orientation: : Oriented to Self, Oriented to Place, Oriented to  Time, Oriented to Situation   Psych Involvement: No (comment)  Admission diagnosis:  Diabetic foot (HCC) [E11.8] AKI (acute kidney injury) (HCC) [N17.9] Type 2 diabetes mellitus with diabetic heel ulcer (HCC) [U44.034, L97.409] Patient Active Problem List   Diagnosis Date Noted   Diabetic foot (HCC) 10/05/2023   Atherosclerosis of native arteries of extremity with rest pain (HCC) 05/29/2023   Carotid stenosis 05/29/2023   Dizziness and giddiness 05/23/2023   Acute respiratory failure with hypoxia (HCC) 12/16/2022   Anemia in chronic kidney disease 01/22/2021   Hyperparathyroidism due to renal insufficiency (HCC) 01/22/2021   Proteinuria, unspecified 01/22/2021   Tobacco abuse    Diabetic ulcer of right midfoot associated with type 2 diabetes mellitus (HCC)    History of hypotension 05/28/2019   Osteomyelitis (HCC) 05/27/2019   Type  2 diabetes mellitus with diabetic peripheral angiopathy with gangrene (HCC) 01/15/2019   Gangrene (HCC) 01/15/2019   Anticoagulant long-term use 12/28/2018   Chronic idiopathic constipation 12/28/2018   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 [place] [address] Phone: [phone] Fax: [phone]     Social Drivers of Health (SDOH) Social History: SDOH Screenings   Food Insecurity: No Food Insecurity (10/05/2023)  Housing: Low Risk  (10/06/2023)  Transportation Needs: No Transportation Needs (10/05/2023)  Utilities: Not At Risk (10/05/2023)  Depression (PHQ2-9): Low Risk  (11/14/2020)  Financial Resource Strain: Low Risk  (08/11/2023)   Received from [facility]  Social Connections: Unknown (05/28/2019)  Stress: No Stress Concern Present (05/28/2019)  Tobacco Use: High Risk (10/04/2023)   SDOH Interventions:     Readmission Risk Interventions     No data to display

## 2023-10-10 NOTE — Transfer of Care (Signed)
 Immediate Anesthesia Transfer of Care Note  Patient: Donna Fisher  Procedure(s) Performed: AMPUTATION, ABOVE KNEE (Left: Knee)  Patient Location: PACU  Anesthesia Type:General  Level of Consciousness: drowsy  Airway & Oxygen Therapy: Patient Spontanous Breathing and Patient connected to face mask oxygen  Post-op Assessment: Report given to RN and Post -op Vital signs reviewed and stable  Post vital signs: Reviewed and stable  Last Vitals:  Vitals Value Taken Time  BP 120/63 10/10/23 1218  Temp 36.1 C 10/10/23 1218  Pulse 98 10/10/23 1220  Resp 19 10/10/23 1220  SpO2 100 % 10/10/23 1220  Vitals shown include unfiled device data.  Last Pain:  Vitals:   10/10/23 1000  TempSrc: Tympanic  PainSc: 4       Patients Stated Pain Goal: 0 (10/05/23 1610)  Complications: No notable events documented.

## 2023-10-10 NOTE — Interval H&P Note (Signed)
 History and Physical Interval Note:  10/10/2023 8:59 AM  Donna Fisher  has presented today for surgery, with the diagnosis of Osteomyelitis.  The various methods of treatment have been discussed with the patient and family. After consideration of risks, benefits and other options for treatment, the patient has consented to  Procedure(s) with comments: AMPUTATION, ABOVE KNEE (Left) - Patient does not want a nerve block. as a surgical intervention.  The patient's history has been reviewed, patient examined, no change in status, stable for surgery.  I have reviewed the patient's chart and labs.  Questions were answered to the patient's satisfaction.     [name]

## 2023-10-10 NOTE — Plan of Care (Signed)
 Education provided this morning prior to surgical intervention (AKA).  Left stump elevated on pillows with ice pack in place.  Medication for pain prior to returning to floor. Discussed control; of diabetes and measures to prevent further complications. Spouse at bedside.  Problem: Education: Goal: Ability to describe self-care measures that may prevent or decrease complications (Diabetes Survival Skills Education) will improve Outcome: Progressing   Problem: Coping: Goal: Ability to adjust to condition or change in health will improve Outcome: Progressing   Problem: Fluid Volume: Goal: Ability to maintain a balanced intake and output will improve Outcome: Progressing   Problem: Health Behavior/Discharge Planning: Goal: Ability to identify and utilize available resources and services will improve Outcome: Progressing Goal: Ability to manage health-related needs will improve Outcome: Progressing   Problem: Metabolic: Goal: Ability to maintain appropriate glucose levels will improve Outcome: Progressing   Problem: Nutritional: Goal: Maintenance of adequate nutrition will improve Outcome: Progressing Goal: Progress toward achieving an optimal weight will improve Outcome: Progressing   Problem: Tissue Perfusion: Goal: Adequacy of tissue perfusion will improve Outcome: Progressing   Problem: Education: Goal: Knowledge of General Education information will improve Description: Including pain rating scale, medication(s)/side effects and non-pharmacologic comfort measures Outcome: Progressing   Problem: Health Behavior/Discharge Planning: Goal: Ability to manage health-related needs will improve Outcome: Progressing   Problem: Clinical Measurements: Goal: Ability to maintain clinical measurements within normal limits will improve Outcome: Progressing Goal: Will remain free from infection Outcome: Progressing Goal: Diagnostic test results will improve Outcome:  Progressing Goal: Respiratory complications will improve Outcome: Progressing Goal: Cardiovascular complication will be avoided Outcome: Progressing   Problem: Activity: Goal: Risk for activity intolerance will decrease Outcome: Progressing   Problem: Elimination: Goal: Will not experience complications related to bowel motility Outcome: Progressing Goal: Will not experience complications related to urinary retention Outcome: Progressing   Problem: Pain Managment: Goal: General experience of comfort will improve and/or be controlled Outcome: Progressing   Problem: Safety: Goal: Ability to remain free from injury will improve Outcome: Progressing
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
## 2023-10-10 NOTE — Interval H&P Note (Signed)
 History and Physical Interval Note:  10/10/2023 10:16 AM  Donna Fisher  has presented today for surgery, with the diagnosis of Osteomyelitis.  The various methods of treatment have been discussed with the patient and family. After consideration of risks, benefits and other options for treatment, the patient has consented to  Procedure(s) with comments: AMPUTATION, ABOVE KNEE (Left) - Patient does not want a nerve block. as a surgical intervention.  The patient's history has been reviewed, patient examined, no change in status, stable for surgery.  I have reviewed the patient's chart and labs.  Questions were answered to the patient's satisfaction.     [name]

## 2023-10-10 NOTE — Progress Notes (Signed)
 Patient arrived from room 122

## 2023-10-10 NOTE — Anesthesia Postprocedure Evaluation (Signed)
 Anesthesia Post Note  Patient: Donna Fisher  Procedure(s) Performed: AMPUTATION, ABOVE KNEE (Left: Knee)  Patient location during evaluation: PACU Anesthesia Type: General Level of consciousness: awake and alert, oriented and patient cooperative Pain management: pain level controlled Vital Signs Assessment: post-procedure vital signs reviewed and stable Respiratory status: spontaneous breathing, nonlabored ventilation and respiratory function stable Cardiovascular status: blood pressure returned to baseline and stable Postop Assessment: adequate PO intake Anesthetic complications: no   No notable events documented.   Last Vitals:  Vitals:   10/10/23 1222 10/10/23 1230  BP:  106/64  Pulse: 98 97  Resp: 19 18  Temp:    SpO2: 100% 98%    Last Pain:  Vitals:   10/10/23 1230  TempSrc:   PainSc: 9                  Reed Breech

## 2023-10-10 NOTE — Progress Notes (Signed)
 PT Cancellation Note  Patient Details Name: Donna Fisher MRN: [id number] DOB: [birth year]   Cancelled Treatment:    Reason Eval/Treat Not Completed: Medical issues which prohibited therapy (Pt going for AKA today. Will sign off and await new order once ready to begin physical therapy again.)  8:58 AM, 10/10/23 Rosamaria Lints, PT, DPT Physical Therapist - [facility]  [phone] (ASCOM)    Catalina Salasar C 10/10/2023, 8:58 AM

## 2023-10-10 NOTE — Consult Note (Signed)
 Pharmacy Antibiotic Note  ASSESSMENT: 58 y.o. female with PMH including T2DM, HLD, PAD, DFI/DFO, cellulitis, osteomyelitis, gangrene, carotid stenosis is presenting with  necrotizing infection of left ankle . Current plan is for patient to undergo amputation and to start antibiotics in the meantime. Pharmacy has been consulted to manage cefepime and vancomycin dosing. Patient's serum creatinine slightly above baseline - will dose vanc by AUC and continue to monitor.   Patient with allergy to penicillins and ceftriaxone in chart. Patient received meropenem around the same time as ceftriaxone and so it is possible meropenem contributed to rash, however temporally more likely ceftriaxone.  After discussion with provider, ceftriaxone reaction deemed to not be convincing and will administer cefepime x 1 and monitor for reaction.  Update: Patient tolerated cefepime dose.  Patient measurements: Height: 5' 2.5" (158.8 cm) Weight: 74.8 kg (165 lb) IBW/kg (Calculated) : 51.25  Vital signs: Temp: 97.8 F (36.6 C) (03/24 0834) Temp Source: Oral (03/24 0834) BP: 124/65 (03/24 0834) Pulse Rate: 88 (03/24 0834) Recent Labs  Lab 10/04/23 2132 10/06/23 0606 10/07/23 0513 10/08/23 1001 10/09/23 0430 10/10/23 0418  WBC 5.5  --  10.6*  --   --  8.5  CREATININE 2.04*   < > 1.65* 1.34* 1.26* 1.14*   < > = values in this interval not displayed.   Estimated Creatinine Clearance: 52.2 mL/min (A) (by C-G formula based on SCr of 1.14 mg/dL (H)).  Allergies: Allergies  Allergen Reactions   Atorvastatin Hives   Penicillins Swelling, Other (See Comments) and Anaphylaxis    .Has patient had a PCN reaction causing immediate rash, facial/tongue/throat swelling, SOB or lightheadedness with hypotension: No  Has patient had a PCN reaction causing severe rash involving mucus membranes or skin necrosis: No  Has patient had a PCN reaction that required hospitalization: No  Has patient had a PCN reaction  occurring within the last 10 years: No  If all of the above answers are "NO", then may proceed with Cephalosporin use.  Around 2014 or earlier, she had severe rash and throat swelling to penicillins   Codeine Itching and Other (See Comments)   Ceftriaxone Rash   Linezolid Nausea Only    Antimicrobials this admission: Cefepime 3/19 >> Vancomycin 3/19 >> Flagyl >>  Dose adjustments this admission: N/A  Microbiology results: 3/19 Wound cx: in process   PLAN: Continue cefepime from  2 g IV q12H    Crcl 52.2 ml/min Continue vancomycin 1000 mg IV q24H eAUC 453.3, Cmin 11.6 Scr 1.14, IBW, Vd 0.72 L/kg Patient also on metronidazole 500 mg IV q12H S/p foot amputation 3/19 and tibia hardware removal Patient going for AKA today, 3/24; follow up antibiotic plans post procedure Follow up culture results to assess for antibiotic optimization Monitor renal function to assess for any necessary antibiotic dosing changes.   Thank you for allowing pharmacy to be a part of this patient's care.  Paulita Fujita, PharmD Clinical Pharmacist 10/10/2023

## 2023-10-10 NOTE — Op Note (Signed)
 [facility]   OPERATIVE NOTE   PROCEDURE:  Left above-the-knee amputation  PRE-OPERATIVE DIAGNOSIS: Left tibial osteomyelitis  POST-OPERATIVE DIAGNOSIS: same as above  SURGEON:  [name], MD  ASSISTANT(S): Rolla Plate, NP  ANESTHESIA: general  ESTIMATED BLOOD LOSS: 300 cc  FINDING(S): none  SPECIMEN(S):  Left above-the-knee amputation  INDICATIONS:   Donna Fisher is a 58 y.o. female who presents with left tibial osteomyelitis and sepsis.  She had an open amputation and removal of tibial rod last week to clear the infection.  An MRI postoperatively showed persistent infection in the tibia and it was unlikely she would heal a below-knee amputation without infectious issues.  The patient is scheduled for a left above-the-knee amputation.  I discussed in depth with the patient the risks, benefits, and alternatives to this procedure.  The patient is aware that the risk of this operation included but are not limited to:  bleeding, infection, myocardial infarction, stroke, death, failure to heal amputation wound, and possible need for more proximal amputation.  The patient is aware of the risks and agrees proceed forward with the procedure. An assistant was present during the procedure to help facilitate the exposure and expedite the procedure.   DESCRIPTION: After full informed written consent was obtained from the patient, the patient was taken to the operating room, and placed supine upon the operating table.  Prior to induction, the patient received IV antibiotics.  The patient was then prepped and draped in the standard fashion for a left above-the-knee amputation. The assistant provided retraction and mobilization to help facilitate exposure and expedite the procedure throughout the entire procedure.  This included following suture, using retractors, and optimizing lighting.  After obtaining adequate anesthesia, the patient was prepped and draped in the  standard fashion for a above-the-knee amputation.  I marked out the anterior and posterior flaps for a fish-mouth type of amputation.  I made the incisions for these flaps, and then dissected through the subcutaneous tissue, fascia, and muscles circumferentially.  I elevated  the periosteal tissue 4-5 cm more proximal than the anterior skin flap.  I then transected the femur with a power saw at this level.  Then I smoothed out the rough edges of the bone.  At this point, the specimen was passed off the field as the above-the-knee amputation.  At this point, I clamped all visibly bleeding arteries and veins using a combination of suture ligation with silk suture and electrocautery.   Bleeding continued to be controlled with electrocautery and suture ligature.  The stump was washed off with sterile normal saline and no further active bleeding was noted.  I reapproximated the anterior and posterior fascia  with interrupted stitches of 0 Vicryl.  This was completed along the entire length of anterior and posterior fascia until there were no more loose space in the fascial line. The subcutaneous tissue was then approximated with 2-0 vicryl sutures. The skin was then  reapproximated with staples.  The stump was washed off and dried.  The incision was dressed with Xeroform and ABD pads, and  then fluffs were applied.  Kerlix was wrapped around the leg and then gently an ACE wrap was applied.  A large Ioban was then placed over the ACE wrap to secure the dressing. The patient was then awakened and take to the recovery room in stable condition.   COMPLICATIONS: none  CONDITION: stable  [name]  10/10/2023, 12:05 PM   This note was created with  [employer] transcription system. Any errors in dictation are purely unintentional.

## 2023-10-10 NOTE — Anesthesia Procedure Notes (Signed)
 Procedure Name: Intubation Date/Time: 10/10/2023 11:15 AM  Performed by: Hezzie Bump, CRNAPre-anesthesia Checklist: Patient identified, Patient being monitored, Timeout performed, Emergency Drugs available and Suction available Patient Re-evaluated:Patient Re-evaluated prior to induction Oxygen Delivery Method: Circle system utilized Preoxygenation: Pre-oxygenation with 100% oxygen Induction Type: IV induction Ventilation: Mask ventilation without difficulty Laryngoscope Size: Mac and 3 Grade View: Grade I Tube type: Oral Tube size: 6.5 mm Number of attempts: 1 Airway Equipment and Method: Stylet and Video-laryngoscopy Placement Confirmation: ETT inserted through vocal cords under direct vision, positive ETCO2 and breath sounds checked- equal and bilateral Secured at: 22 cm Tube secured with: Tape Dental Injury: Teeth and Oropharynx as per pre-operative assessment

## 2023-10-11 ENCOUNTER — Encounter: Payer: Self-pay | Admitting: Vascular Surgery

## 2023-10-11 DIAGNOSIS — E118 Type 2 diabetes mellitus with unspecified complications: Secondary | ICD-10-CM | POA: Diagnosis not present

## 2023-10-11 LAB — CBC
HCT: 24.3 % — ABNORMAL LOW (ref 36.0–46.0)
Hemoglobin: 7.8 g/dL — ABNORMAL LOW (ref 12.0–15.0)
MCH: 29.7 pg (ref 26.0–34.0)
MCHC: 32.1 g/dL (ref 30.0–36.0)
MCV: 92.4 fL (ref 80.0–100.0)
Platelets: 269 10*3/uL (ref 150–400)
RBC: 2.63 MIL/uL — ABNORMAL LOW (ref 3.87–5.11)
RDW: 14.2 % (ref 11.5–15.5)
WBC: 15.7 10*3/uL — ABNORMAL HIGH (ref 4.0–10.5)
nRBC: 0 % (ref 0.0–0.2)

## 2023-10-11 LAB — GLUCOSE, CAPILLARY
Glucose-Capillary: 120 mg/dL — ABNORMAL HIGH (ref 70–99)
Glucose-Capillary: 164 mg/dL — ABNORMAL HIGH (ref 70–99)
Glucose-Capillary: 140 mg/dL — ABNORMAL HIGH (ref 70–99)
Glucose-Capillary: 133 mg/dL — ABNORMAL HIGH (ref 70–99)

## 2023-10-11 LAB — BASIC METABOLIC PANEL
Anion gap: 6 (ref 5–15)
BUN: 18 mg/dL (ref 6–20)
CO2: 25 mmol/L (ref 22–32)
Calcium: 8.1 mg/dL — ABNORMAL LOW (ref 8.9–10.3)
Chloride: 107 mmol/L (ref 98–111)
Creatinine, Ser: 1.32 mg/dL — ABNORMAL HIGH (ref 0.44–1.00)
GFR, Estimated: 47 mL/min — ABNORMAL LOW (ref >60)
Glucose, Bld: 147 mg/dL — ABNORMAL HIGH (ref 70–99)
Potassium: 3.9 mmol/L (ref 3.5–5.1)
Sodium: 138 mmol/L (ref 135–145)

## 2023-10-11 MED ORDER — FE FUM-VIT C-VIT B12-FA 460-60-0.01-1 MG PO CAPS
1.0000 | ORAL_CAPSULE | Freq: Two times a day (BID) | ORAL | Status: DC
  Administered 2023-10-11 – 2023-10-14 (×7): 1 via ORAL
  Filled 2023-10-11 (×7): qty 1

## 2023-10-11 MED ORDER — ORAL CARE MOUTH RINSE: 15.0000 mL | OROMUCOSAL | Status: DC | PRN

## 2023-10-11 MED ORDER — POLYETHYLENE GLYCOL 3350 17 G PO PACK
17.0000 g | PACK | Freq: Every day | ORAL | Status: DC
  Administered 2023-10-11: 17 g via ORAL
  Filled 2023-10-11 (×2): qty 1

## 2023-10-11 MED ORDER — DOCUSATE SODIUM 100 MG PO CAPS
100.0000 mg | ORAL_CAPSULE | Freq: Two times a day (BID) | ORAL | Status: DC
Start: 2023-10-11 — End: 2023-10-14
  Administered 2023-10-11 (×2): 100 mg via ORAL
  Filled 2023-10-11 (×3): qty 1

## 2023-10-11 MED ORDER — NYSTATIN 100000 UNIT/ML MT SUSP: 5.0000 mL | Freq: Four times a day (QID) | OROMUCOSAL | Status: DC | PRN

## 2023-10-11 NOTE — Plan of Care (Signed)

## 2023-10-11 NOTE — TOC Progression Note (Signed)
 Transition of Care [facility]) - Progression Note    Patient Details  Name: Donna Fisher MRN: [id number] Date of Birth: [birth year]  Transition of Care [facility]) CM/SW Contact  Cherre Blanc, RN Phone Number: 10/11/2023, 11:24 AM  Clinical Narrative:     Jory Sims called referral to Huntley Dec at [facility] for [name] for wound management, and PT/OT. Awaiting callback from Huntley Dec to determine if [facility] can except the patient. TOC will continue to follow.  Expected Discharge Plan: Home w Home Health Services Barriers to Discharge: Continued Medical Work up  Expected Discharge Plan and Services   Discharge Planning Services: CM Consult   Living arrangements for the past 2 months: Single Family Home                           HH Arranged: OT, PT HH Agency: Other - See comment Cindie Laroche) Date HH Agency Contacted: 10/10/23 Time HH Agency Contacted: 1517 Representative spoke with at [facility] Agency: Huntley Dec [phone]   Social Determinants of Health (SDOH) Interventions SDOH Screenings   Food Insecurity: No Food Insecurity (10/05/2023)  Housing: Low Risk  (10/06/2023)  Transportation Needs: No Transportation Needs (10/05/2023)  Utilities: Not At Risk (10/05/2023)  Depression (PHQ2-9): Low Risk  (11/14/2020)  Financial Resource Strain: Low Risk  (08/11/2023)   Received from [facility]  Social Connections: Unknown (05/28/2019)  Stress: No Stress Concern Present (05/28/2019)  Tobacco Use: High Risk (10/04/2023)    Readmission Risk Interventions     No data to display

## 2023-10-11 NOTE — TOC Progression Note (Signed)
 Transition of Care [facility]) - Progression Note    Patient Details  Name: Donna Fisher MRN: [id number] Date of Birth: 21-May-1966  Transition [facility] Care [facility]) CM/SW Contact  Cherre Blanc, RN Phone Number: 10/11/2023, 12:00 PM  Clinical Narrative:    TOC received call from Huntley Dec [phone] with [facility], they are oon with the patient and unable to accept her for services at this time. TOC will continue to follow.   Expected Discharge Plan: Home w Home Health Services Barriers to Discharge: Continued Medical Work up  Expected Discharge Plan and Services   Discharge Planning Services: CM Consult   Living arrangements for the past 2 months: Single Family Home                           HH Arranged: OT, PT HH Agency: Other - See comment Cindie Laroche) Date HH Agency Contacted: 10/10/23 Time HH Agency Contacted: 1517 Representative spoke with at [facility] Agency: Huntley Dec [phone]   Social Determinants of Health (SDOH) Interventions SDOH Screenings   Food Insecurity: No Food Insecurity (10/05/2023)  Housing: Low Risk  (10/06/2023)  Transportation Needs: No Transportation Needs (10/05/2023)  Utilities: Not At Risk (10/05/2023)  Depression (PHQ2-9): Low Risk  (11/14/2020)  Financial Resource Strain: Low Risk  (08/11/2023)   Received from [facility]  Social Connections: Unknown (05/28/2019)  Stress: No Stress Concern Present (05/28/2019)  Tobacco Use: High Risk (10/04/2023)    Readmission Risk Interventions     No data to display

## 2023-10-11 NOTE — Progress Notes (Signed)
 PROGRESS NOTE    Donna Fisher  ZOX:[id number] DOB: 1966-04-30 DOA: 10/05/2023 PCP: Abram Sander, MD  [id number]/[id number]-[id number]  LOS: 6 days   Brief hospital course:  Donna Fisher is a 58 y.o. female with medical history significant of type 2 diabetes, diabetic neuropathy, stage III chronic kidney disease, who was sent to ED by Dr. Ether Griffins of podiatry after xray showed infection and gas in the left foot.  3/22: Hemodynamically stable, AKA on Monday  3/23: Remains stable, improving creatinine.  Going to the OR tomorrow for AKA  3/24: Going for AKA today  3/25: S/p AKA yesterday, tolerated the procedure well.  Hemoglobin decreased to 7.8 and some leukocytosis today which likely is reactive.  Starting on iron supplement. Will continue antibiotics for 1 more day.  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 Wyn Quaker and removal of hardware by Dr. Joice Lofts on 3/19 --MRI to her left lower extremity revealing chronic osteomyelitis of her tibia.  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 --f/u Surgical cx -currently no growth --cont vanc, cefepime and Flagyl-for 1 more day --cont wound vac   Hypotension --cont home midodrine   DM2 --BG mostly in 100's --hold long-acting insulin for now --ACHS and SSI   AKI on CKD 3b --CR 2.04 on presentation, improved to 1.14 this morning.  Diabetic neuropathy --cont home gabapentin  Dizziness and nausea --worse with movement, likely vertigo --trial meclizine PRN  Anemia.  Hemoglobin decreased to 7.8 after the procedure today, likely due to blood loss during surgery.  Starting on supplement. -Continue to monitor -Transfuse if below 7  DVT prophylaxis: Lovenox SQ Code Status: Full code  Family Communication:  Level of care: Med-Surg Dispo:   The patient is from: home Anticipated d/c is to: home Anticipated d/c date is: > 3  days   Subjective and Interval History:  Patient was sitting in bed when seen today.  Having some pain but stating it is tolerable.  Objective: Vitals:   10/10/23 1635 10/10/23 2008 10/11/23 0344 10/11/23 0749  BP: (!) 92/56 105/60 100/62 (!) 98/56  Pulse: 97 90 93 90  Resp: 18 20 20 19   Temp: 97.7 F (36.5 C) 97.7 F (36.5 C) 98.1 F (36.7 C) 97.7 F (36.5 C)  TempSrc: Oral Oral Oral   SpO2: 98% 100% 99% 99%  Weight:      Height:        Intake/Output Summary (Last 24 hours) at 10/11/2023 1627 Last data filed at 10/11/2023 1500 Gross per 24 hour  Intake 720 ml  Output --  Net 720 ml   Filed Weights   10/05/23 0625 10/05/23 1540 10/10/23 1000  Weight: 73.9 kg 74.8 kg 74.8 kg    Examination:   General.  Well-developed lady, in no acute distress. Pulmonary.  Lungs clear bilaterally, normal respiratory effort. CV.  Regular rate and rhythm, no JVD, rub or murmur. Abdomen.  Soft, nontender, nondistended, BS positive. CNS.  Alert and oriented .  No focal neurologic deficit. Extremities.  Left AKA Psychiatry.  Judgment and insight appears normal.    Data Reviewed: I have personally reviewed labs and imaging studies  Time spent: 42 minutes  This record has been created using Conservation officer, historic buildings. Errors have been sought and corrected,but may not always be located. Such creation errors do not reflect on the standard of care.  Arnetha Courser, MD [facility] If 7PM-7AM, please contact night-coverage 10/11/2023, 4:27 PM

## 2023-10-11 NOTE — Evaluation (Signed)
 Physical Therapy Evaluation Patient Details Name: Donna Fisher MRN: [id number] DOB: 09-May-1966 Today's Date: 10/11/2023  History of Present Illness  Donna Fisher is a 57yoF comes to [facility] ED via Dr. Ether Griffins of podiatry after xray showed infection and gas in the left foot. PMH: DM2, diabetic neuropathy, CKD 3,  Left ankle ORIF/fusion. Imaging revealing of evidence of chronic osteomyelitis involving the calcaneus and most of the tibial shaft. 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  Clinical Impression  Pt in bed on arrival, agreeable to session although she is fairly sore and tired from sitting up with visitors earlier. Pt able to demonstrate safe, successful bed mobility to Rt EOB using 1 rail, then STS from standard height and STS from slight elevation, both with RW, but with improved effort and technique under latter conditions. Pt able to maintain good seated balance at EOB. Pt reports already having successful pivot transfer to [facility] earlier with NSG. Educated pt on why RW would be more appropriate for mobility at DC rather than rollator walker. Also explain how her post surgical status could make her chronic hypotension a bit worse for a time. Will continue to follow, plan to bring handout to room next day. Pt left seated EOB with OT.       If plan is discharge home, recommend the following: A little help with walking and/or transfers;A little help with bathing/dressing/bathroom;Assistance with cooking/housework;Assist for transportation;Help with stairs or ramp for entrance   Can travel by private vehicle        Equipment Recommendations Rolling walker (2 wheels)  Recommendations for Other Services       Functional Status Assessment Patient has had a recent decline in their functional status and demonstrates the ability to make significant  improvements in function in a reasonable and predictable amount of time.     Precautions / Restrictions Precautions Precautions: Fall Recall of Precautions/Restrictions: Intact Precaution/Restrictions Comments: L AKA Restrictions Weight Bearing Restrictions Per Provider Order: Yes LLE Weight Bearing Per Provider Order: Non weight bearing      Mobility  Bed Mobility Overal bed mobility: Modified Independent             General bed mobility comments: bed flat to EOB Right; uses bed railing (husband getting a bed rail for home use)    Transfers Overall transfer level: Needs assistance Equipment used: Rolling walker (2 wheels) Transfers: Sit to/from Stand Sit to Stand: Supervision (RLE weak; pt reliant on heavy trunk flexion + heavu BUE press off RW to rise;)           General transfer comment: can perform more traditional pattern (1 hand push of bed, errect trunk). with bed elevated 2-3 inches.    Ambulation/Gait Ambulation/Gait assistance:  (deferred at this time.)                Stairs            Wheelchair Mobility     Tilt Bed    Modified Rankin (Stroke Patients Only)       Balance                                             Pertinent Vitals/Pain Pain Assessment Pain Assessment: 0-10 Pain  Score: 8  Pain Location: L residual limb Pain Descriptors / Indicators: Discomfort, Grimacing Pain Intervention(s): Limited activity within patient's tolerance, Monitored during session    Home Living Family/patient expects to be discharged to:: Private residence (if possible) Living Arrangements: Spouse/significant other (works [employer] 1st shift hours;) Available Help at Discharge: Family (Kim (friend) Horticulturist, commercial and [place] (2 DTR) will all be helpinhg out) Type of Home: House Home Access: Stairs to enter Entrance Stairs-Rails: Right;Left (husband is going to build a ramp) Secretary/administrator of Steps: 2   Home Layout: One  level Home Equipment: Rollator (4 wheels);Shower seat - built in;Wheelchair - manual;BSC/3in1 Additional Comments: sleeps in a standard bed (huband has ordered a bed rail for home), typically exits left    Prior Function Prior Level of Function : Independent/Modified Independent;Driving             Mobility Comments: (chronic hypotension on PRN midodrine) ADLs Comments: None     Extremity/Trunk Assessment                Communication        Cognition Arousal: Alert Behavior During Therapy: WFL for tasks assessed/performed                                     Cueing       General Comments      Exercises Other Exercises Other Exercises: STS from EOB x3, elevated with RW, minGuard assists   Assessment/Plan    PT Assessment Patient needs continued PT services  PT Problem List Decreased strength;Pain;Decreased range of motion;Decreased activity tolerance;Decreased knowledge of use of DME;Decreased balance;Decreased safety awareness;Decreased mobility;Decreased knowledge of precautions       PT Treatment Interventions DME instruction;Balance training;Neuromuscular re-education;Gait training;Stair training;Functional mobility training;Patient/family education;Therapeutic activities;Therapeutic exercise;Wheelchair mobility training    PT Goals (Current goals can be found in the Care Plan section)  Acute Rehab PT Goals Patient Stated Goal: return to home at DC if possible PT Goal Formulation: With patient Time For Goal Achievement: 10/25/23 Potential to Achieve Goals: Good    Frequency Min 3X/week     Co-evaluation               AM-PAC PT "6 Clicks" Mobility  Outcome Measure Help needed turning from your back to your side while in a flat bed without using bedrails?: A Little Help needed moving from lying on your back to sitting on the side of a flat bed without using bedrails?: A Little Help needed moving to and from a bed to a chair  (including a wheelchair)?: A Little Help needed standing up from a chair using your arms (e.g., wheelchair or bedside chair)?: A Little Help needed to walk in hospital room?: A Lot Help needed climbing 3-5 steps with a railing? : A Lot 6 Click Score: 16    End of Session   Activity Tolerance: Patient tolerated treatment well;Other (comment) Patient left: with call bell/phone within reach;in bed;with nursing/sitter in room Nurse Communication: Mobility status PT Visit Diagnosis: Other abnormalities of gait and mobility (R26.89);Difficulty in walking, not elsewhere classified (R26.2);Muscle weakness (generalized) (M62.81)    Time: 1610-9604 PT Time Calculation (min) (ACUTE ONLY): 18 min   Charges:   PT Evaluation $PT Eval Moderate Complexity: 1 Mod PT Treatments $Therapeutic Activity: 8-22 mins PT General Charges $$ ACUTE PT VISIT: 1 Visit        3:36 PM, 10/11/23 Rosamaria Lints,  PT, DPT Physical Therapist - [facility]  [phone] (ASCOM)    Collis Thede C 10/11/2023, 3:32 PM

## 2023-10-11 NOTE — Care Management Important Message (Signed)
 Important Message  Patient Details  Name: Donna Fisher MRN: [id number] Date of Birth: Jul 10, 1966   Important Message Given:  Yes - Medicare IM     Cristela Blue, CMA 10/11/2023, 11:33 AM

## 2023-10-11 NOTE — Evaluation (Signed)
 Occupational Therapy Re-Evaluation Patient Details Name: Donna Fisher MRN: [id number] DOB: 04-26-1966 Today's Date: 10/11/2023   History of Present Illness   Donna Fisher is a 57yoF comes to [facility] ED via Dr. Ether Griffins of podiatry after xray showed infection and gas in the left foot. PMH: DM2, diabetic neuropathy, CKD 3,  Left ankle ORIF/fusion. Imaging revealing of evidence of chronic osteomyelitis involving the calcaneus and most of the tibial shaft. 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     Clinical Impressions Pt seen for OT re-eval, s/p L AKA on 10/10/23. Received sitting EOB wit PT. Pt reports that husband is installing a ramp, and plans to discharge home with 24/7 support from family. Educated on progressive muscle relaxation for phantom pain, DME/AE recommendations and discussed safe functional mobility/transfers in the home environment to reduce caregiver burden and prevent falls. Pt performs bed mobility MOD I using bed features, able to don underwear from STS using RW and CGA for standing balance. Demonstrated step pivot transfer with RW bed <> recliner with CGA, cues for hand placement and technique. Pt would benefit from skilled OT services to address noted impairments and functional limitations (see below for any additional details) in order to maximize safety and independence while minimizing falls risk and caregiver burden. Anticipate the need for follow up [facility] OT services upon acute hospital DC.      If plan is discharge home, recommend the following:   A little help with walking and/or transfers;A little help with bathing/dressing/bathroom;Help with stairs or ramp for entrance;Assistance with cooking/housework     Functional Status Assessment   Patient has had a recent decline in their functional status and demonstrates the ability  to make significant improvements in function in a reasonable and predictable amount of time.     Equipment Recommendations   BSC/3in1      Precautions/Restrictions   Precautions Precautions: Fall Recall of Precautions/Restrictions: Intact Precaution/Restrictions Comments: L AKA Restrictions Weight Bearing Restrictions Per Provider Order: Yes LLE Weight Bearing Per Provider Order: Non weight bearing     Mobility Bed Mobility Overal bed mobility: Modified Independent                  Transfers Overall transfer level: Needs assistance Equipment used: Rolling walker (2 wheels) Transfers: Sit to/from Stand Sit to Stand: Contact guard assist Stand pivot transfers: Contact guard assist   Step pivot transfers: Contact guard assist            Balance Overall balance assessment: Needs assistance Sitting-balance support: No upper extremity supported, Feet supported Sitting balance-Leahy Scale: Normal     Standing balance support: Bilateral upper extremity supported Standing balance-Leahy Scale: Fair Standing balance comment: close CGA to pull underwear over hips with alternating UE support on RW                           ADL either performed or assessed with clinical judgement   ADL Overall ADL's : Needs assistance/impaired                     Lower Body Dressing: Supervision/safety;Sitting/lateral leans;Sit to/from stand Lower Body Dressing Details (indicate cue type and reason): dons underwear SBA seated EOB, edu on compensatory strategies, cues for alternating hand placement on RW Toilet Transfer: Contact guard assist;Cueing for sequencing;Cueing  for safety;BSC/3in1;Rolling walker (2 wheels) Toilet Transfer Details (indicate cue type and reason): simulated t/f recliner using RW, cues for sequencing and hand placement for "hopping"         Functional mobility during ADLs: Contact guard assist;Cueing for sequencing;Cueing for  safety;Rolling walker (2 wheels) General ADL Comments: t/f recliner using RW, good UE stability, cues for sequencing     Vision Baseline Vision/History: 1 Wears glasses Ability to See in Adequate Light: 0 Adequate Patient Visual Report: Blurring of vision (pt states feeling like vision is slightly blurred and watery since surgery) Vision Assessment?: Wears glasses for reading;Wears glasses for driving Additional Comments: bifocals            Pertinent Vitals/Pain Pain Assessment Pain Assessment: 0-10 Pain Score: 8  Pain Location: L residual limb Pain Descriptors / Indicators: Discomfort, Grimacing Pain Intervention(s): Limited activity within patient's tolerance     Extremity/Trunk Assessment Upper Extremity Assessment Upper Extremity Assessment: Overall WFL for tasks assessed;Left hand dominant (5/5 BUE strength)   Lower Extremity Assessment Lower Extremity Assessment: LLE deficits/detail LLE Deficits / Details: L AKA       Communication Communication Communication: No apparent difficulties   Cognition Arousal: Alert Behavior During Therapy: WFL for tasks assessed/performed Cognition: No apparent impairments                               Following commands: Intact       Cueing  General Comments   Cueing Techniques: Verbal cues      Exercises Exercises: Other exercises Other Exercises Other Exercises: edu on role/purpose of OT Other Exercises: edu on graded motor imagery, mirror therapy and progressive muscle relaxation   Shoulder Instructions      Home Living Family/patient expects to be discharged to:: Private residence Living Arrangements: Spouse/significant other Available Help at Discharge: Family;Available 24 hours/day Type of Home: House Home Access: Ramped entrance;Stairs to enter Entrance Stairs-Number of Steps: 2 Entrance Stairs-Rails: Right;Left Home Layout: One level     Bathroom Shower/Tub: Higher education careers adviser: Handicapped height Bathroom Accessibility: Yes How Accessible: Accessible via wheelchair;Accessible via walker Home Equipment: Rollator (4 wheels);Shower seat - built in;Wheelchair - manual;BSC/3in1;Grab bars - tub/shower;Grab bars - toilet;Hand held shower head;Other (comment) (bed rails)   Additional Comments: sleeps in a standard bed (huband has ordered a bed rail for home), typically exits left      Prior Functioning/Environment Prior Level of Function : Independent/Modified Independent;Driving             Mobility Comments: 4WW ADLs Comments: independent    OT Problem List: Impaired balance (sitting and/or standing);Decreased activity tolerance;Decreased knowledge of use of DME or AE;Decreased knowledge of precautions;Pain   OT Treatment/Interventions: Self-care/ADL training;Therapeutic exercise;Energy conservation;DME and/or AE instruction;Therapeutic activities      OT Goals(Current goals can be found in the care plan section)   Acute Rehab OT Goals OT Goal Formulation: With patient Time For Goal Achievement: 10/25/23 Potential to Achieve Goals: Good   OT Frequency:  Min 3X/week       AM-PAC OT "6 Clicks" Daily Activity     Outcome Measure Help from another person eating meals?: None Help from another person taking care of personal grooming?: None Help from another person toileting, which includes using toliet, bedpan, or urinal?: A Little Help from another person bathing (including washing, rinsing, drying)?: A Little Help from another person to put on and taking off regular upper body  clothing?: None Help from another person to put on and taking off regular lower body clothing?: A Little 6 Click Score: 21   End of Session Equipment Utilized During Treatment: Rolling walker (2 wheels);Gait belt Nurse Communication: Mobility status;Patient requests pain meds  Activity Tolerance: Patient tolerated treatment well Patient left: in bed;with call  bell/phone within reach;with bed alarm set  OT Visit Diagnosis: Other abnormalities of gait and mobility (R26.89);Pain Pain - Right/Left: Left Pain - part of body: Leg;Ankle and joints of foot                Time: 1500-1535 OT Time Calculation (min): 35 min Charges:  OT General Charges $OT Visit: 1 Visit OT Evaluation $OT Re-eval: 1 Re-eval OT Treatments $Self Care/Home Management : 23-37 mins  Kyndel Egger L. Dinia Joynt, OTR/L  10/11/23, 3:50 PM

## 2023-10-11 NOTE — Progress Notes (Signed)
 Patient is refusing bed alarm. She said she won't be getting up on her own. She said she didn't want the alarm to go off when she is adjusting in the bed

## 2023-10-11 NOTE — Progress Notes (Signed)
 Progress Note    10/11/2023 9:32 AM 1 Day Post-Op  Subjective:  Donna Fisher is a 58 yo female now POD #1 from left Above the Knee Amputation. Patient is recovering as expected. She endorses some pain this morning but states it is tolerable. No complaints over night and vitals all remain stable.    Vitals:   10/11/23 0344 10/11/23 0749  BP: 100/62 (!) 98/56  Pulse: 93 90  Resp: 20 19  Temp: 98.1 F (36.7 C) 97.7 F (36.5 C)  SpO2: 99% 99%   Physical Exam: Cardiac:  RRR, Normal S1, S2. No murmurs noted.  Lungs:  Clear on auscultation throughout. No rales, rhonchi and wheezing.  Incisions:  Left lower extremity Above the Knee Amputation. Dressing clean dry and intact. No drainage noted.  Extremities:  Left lower extremity with Open Amputation. Warm to touch. Right lower extremity with palpable DP/PT pulses. Warm to touch.  Abdomen:  Positive bowel sounds throughout, soft non tender and non distended.  Neurologic: AAOX3, Answers questions and follows commands.  CBC    Component Value Date/Time   WBC 15.7 (H) 10/11/2023 0522   RBC 2.63 (L) 10/11/2023 0522   HGB 7.8 (L) 10/11/2023 0522   HGB 12.2 05/10/2017 1001   HCT 24.3 (L) 10/11/2023 0522   HCT 35.5 05/10/2017 1001   PLT 269 10/11/2023 0522   PLT 267 05/10/2017 1001   MCV 92.4 10/11/2023 0522   MCV 88 05/10/2017 1001   MCV 90 11/03/2014 0521   MCH 29.7 10/11/2023 0522   MCHC 32.1 10/11/2023 0522   RDW 14.2 10/11/2023 0522   RDW 14.9 05/10/2017 1001   RDW 12.3 11/03/2014 0521   LYMPHSABS 0.6 (L) 11/06/2020 0444   LYMPHSABS 3.3 11/03/2014 0521   MONOABS 0.3 11/06/2020 0444   MONOABS 0.7 11/03/2014 0521   EOSABS 0.0 11/06/2020 0444   EOSABS 0.4 11/03/2014 0521   BASOSABS 0.0 11/06/2020 0444   BASOSABS 0.1 11/03/2014 0521    BMET    Component Value Date/Time   NA 138 10/11/2023 0522   NA 136 11/03/2014 0521   K 3.9 10/11/2023 0522   K 3.9 11/03/2014 0521   CL 107 10/11/2023 0522   CL 105 11/03/2014 0521    CO2 25 10/11/2023 0522   CO2 29 11/03/2014 0521   GLUCOSE 147 (H) 10/11/2023 0522   GLUCOSE 281 (H) 11/03/2014 0521   BUN 18 10/11/2023 0522   BUN 18 11/03/2014 0521   CREATININE 1.32 (H) 10/11/2023 0522   CREATININE 0.87 11/03/2014 0521   CALCIUM 8.1 (L) 10/11/2023 0522   CALCIUM 8.0 (L) 11/03/2014 0521   GFRNONAA 47 (L) 10/11/2023 0522   GFRNONAA >60 11/03/2014 0521   GFRAA 39 (L) 05/29/2019 0831   GFRAA >60 11/03/2014 0521    INR    Component Value Date/Time   INR 1.1 11/03/2020 1406     Intake/Output Summary (Last 24 hours) at 10/11/2023 0932 Last data filed at 10/11/2023 0400 Gross per 24 hour  Intake 1400 ml  Output 300 ml  Net 1100 ml     Assessment/Plan:  58 y.o. female is s/p Left Above the knee amputation. 1 Day Post-Op   PLAN OOB to chair  PT/OT consults  Pain medication PRN First Dressing change by Vascular Surgery on Thursday 10/13/23. Okay to discharge to Rehab after first dressing change. Patient wishes to go home.   DVT prophylaxis:  Lovenox 40 mg Sq Q24    Colisha Redler R Adaly Puder [facility] 10/11/2023 9:32  AM

## 2023-10-12 DIAGNOSIS — E118 Type 2 diabetes mellitus with unspecified complications: Secondary | ICD-10-CM | POA: Diagnosis not present

## 2023-10-12 LAB — BASIC METABOLIC PANEL
Anion gap: 4 — ABNORMAL LOW (ref 5–15)
BUN: 22 mg/dL — ABNORMAL HIGH (ref 6–20)
CO2: 25 mmol/L (ref 22–32)
Calcium: 8.3 mg/dL — ABNORMAL LOW (ref 8.9–10.3)
Chloride: 111 mmol/L (ref 98–111)
Creatinine, Ser: 1.53 mg/dL — ABNORMAL HIGH (ref 0.44–1.00)
GFR, Estimated: 39 mL/min — ABNORMAL LOW (ref >60)
Glucose, Bld: 157 mg/dL — ABNORMAL HIGH (ref 70–99)
Potassium: 4.2 mmol/L (ref 3.5–5.1)
Sodium: 140 mmol/L (ref 135–145)

## 2023-10-12 LAB — SURGICAL PATHOLOGY

## 2023-10-12 LAB — GLUCOSE, CAPILLARY
Glucose-Capillary: 105 mg/dL — ABNORMAL HIGH (ref 70–99)
Glucose-Capillary: 113 mg/dL — ABNORMAL HIGH (ref 70–99)
Glucose-Capillary: 128 mg/dL — ABNORMAL HIGH (ref 70–99)
Glucose-Capillary: 129 mg/dL — ABNORMAL HIGH (ref 70–99)
Glucose-Capillary: 167 mg/dL — ABNORMAL HIGH (ref 70–99)

## 2023-10-12 LAB — CBC
HCT: 25.2 % — ABNORMAL LOW (ref 36.0–46.0)
Hemoglobin: 7.8 g/dL — ABNORMAL LOW (ref 12.0–15.0)
MCH: 29.7 pg (ref 26.0–34.0)
MCHC: 31 g/dL (ref 30.0–36.0)
MCV: 95.8 fL (ref 80.0–100.0)
Platelets: 272 10*3/uL (ref 150–400)
RBC: 2.63 MIL/uL — ABNORMAL LOW (ref 3.87–5.11)
RDW: 14.8 % (ref 11.5–15.5)
WBC: 12 10*3/uL — ABNORMAL HIGH (ref 4.0–10.5)
nRBC: 0.3 % — ABNORMAL HIGH (ref 0.0–0.2)

## 2023-10-12 LAB — MAGNESIUM: Magnesium: 2.4 mg/dL (ref 1.7–2.4)

## 2023-10-12 MED ORDER — LACTULOSE 10 GM/15ML PO SOLN: 30.0000 g | Freq: Two times a day (BID) | ORAL | Status: DC | PRN

## 2023-10-12 MED ORDER — LACTATED RINGERS IV SOLN: INTRAVENOUS | Status: AC

## 2023-10-12 NOTE — TOC Progression Note (Addendum)
 Transition of Care [facility]) - Progression Note    Patient Details  Name: Donna Fisher MRN: [id number] Date of Birth: [birth year]  [facility] CM/SW Contact  [name], LCSW Phone Number: 10/12/2023, 10:39 AM  Clinical Narrative: [facility] has accepted referral for PT, OT, RN. Patient is aware and agreeable. Patient has a BSC and built-in shower chair at home. She is requesting a RW. CSW asked MD to enter order. Husband or mother-in-law will pick her up at discharge.  Expected Discharge Plan: Home w Home Health Services Barriers to Discharge: Continued Medical Work up  Expected Discharge Plan and Services   Discharge Planning Services: CM Consult   Living arrangements for the past 2 months: Single Family Home                           HH Arranged: OT, PT, RN [facility] Agency: [facility] Date HH Agency Contacted: 10/12/23  Representative spoke with at [facility] Agency: [place]   Social Determinants of Health (SDOH) Interventions SDOH Screenings   Food Insecurity: No Food Insecurity (10/05/2023)  Housing: Low Risk  (10/06/2023)  Transportation Needs: No Transportation Needs (10/05/2023)  Utilities: Not At Risk (10/05/2023)  Depression (PHQ2-9): Low Risk  (11/14/2020)  Financial Resource Strain: Low Risk  (08/11/2023)   Received from [facility]  Social Connections: Unknown (05/28/2019)  Stress: No Stress Concern Present (05/28/2019)  Tobacco Use: High Risk (10/04/2023)    Readmission Risk Interventions     No data to display

## 2023-10-12 NOTE — Plan of Care (Signed)
 Received report, family present, resting in bed, moves well in bed, tolerating fluids. Dressing to R AKA dry and intact. Reports pain comes and goes, will continue to monitor.  Up to BR with one to assist. Tolerated well.  Given HS meds.  Sleeping when checked. Rouses easily. Slept 5-6 hours.

## 2023-10-12 NOTE — Progress Notes (Signed)
 Physical Therapy Treatment Patient Details Name: Donna Fisher MRN: [id number] DOB: 09-05-1965 Today's Date: 10/12/2023   History of Present Illness Donna Fisher is a 57yoF comes to [facility] ED via Dr. Ether Griffins of podiatry after xray showed infection and gas in the left foot. PMH: DM2, diabetic neuropathy, CKD 3,  Left ankle ORIF/fusion. Imaging revealing of evidence of chronic osteomyelitis involving the calcaneus and most of the tibial shaft. 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    PT Comments  Pt finished with lunch agreeable to HEP education with handout. Pt does generally well, is limited by pain when attempting hip extension to neutral, also with active hip flexion but has bette tolerance when assisting self with towel. RN/pt report OOB to [facility] multiple times today with continued success. Sent pt a link to a video for tips on car transfers. Will continue to follow.    If plan is discharge home, recommend the following: A little help with walking and/or transfers;A little help with bathing/dressing/bathroom;Assistance with cooking/housework;Assist for transportation;Help with stairs or ramp for entrance   Can travel by private vehicle        Equipment Recommendations  Rolling walker (2 wheels)    Recommendations for Other Services       Precautions / Restrictions Precautions Precautions: Fall Precaution/Restrictions Comments: L AKA Restrictions LLE Weight Bearing Per Provider Order: Non weight bearing     Mobility  Bed Mobility Overal bed mobility: Modified Independent, Needs Assistance (no mobility at this time) Bed Mobility: Rolling Rolling: Contact guard assist, Used rails         General bed mobility comments: fairly painful, but able to perform with bed rails.    Transfers                         Ambulation/Gait                   Stairs             Wheelchair Mobility     Tilt Bed    Modified Rankin (Stroke Patients Only)       Balance                                            Communication    Cognition Arousal: Alert Behavior During Therapy: WFL for tasks assessed/performed   PT - Cognitive impairments: No apparent impairments                                Cueing    Exercises Amputee Exercises Gluteal Sets: AROM, Left, 10 reps, Supine, Limitations Gluteal Sets Limitations: towel roll x3 secH Towel Squeeze: AROM, Both, 10 reps, Supine Hip ABduction/ADduction: AROM, Sidelying, 10 reps, Left Hip Flexion/Marching: AROM, Left, Supine, 15 reps Other Exercises Other Exercises: Rt Single Leg bridge 1x10 Other Exercises: Rt sidelying Left hip extension P/ROM stretch: 1x60sec (does nto toelrate range to neutral yet, only to ~20 degrees)    General Comments        Pertinent Vitals/Pain Pain Assessment Pain Assessment: 0-10 Pain Score: 4     Home Living  Prior Function            PT Goals (current goals can now be found in the care plan section) Acute Rehab PT Goals Patient Stated Goal: return to home at DC if possible PT Goal Formulation: With patient Potential to Achieve Goals: Good Progress towards PT goals: Progressing toward goals    Frequency    Min 3X/week      PT Plan      Co-evaluation              AM-PAC PT "6 Clicks" Mobility   Outcome Measure  Help needed turning from your back to your side while in a flat bed without using bedrails?: A Little Help needed moving from lying on your back to sitting on the side of a flat bed without using bedrails?: A Little Help needed moving to and from a bed to a chair (including a wheelchair)?: A Little Help needed standing up from a chair using your arms (e.g., wheelchair or bedside chair)?: A  Little Help needed to walk in hospital room?: A Lot Help needed climbing 3-5 steps with a railing? : A Lot 6 Click Score: 16    End of Session   Activity Tolerance: Patient tolerated treatment well;Other (comment);Patient limited by pain Patient left: with call bell/phone within reach;in bed;with nursing/sitter in room Nurse Communication: Mobility status PT Visit Diagnosis: Other abnormalities of gait and mobility (R26.89);Difficulty in walking, not elsewhere classified (R26.2);Muscle weakness (generalized) (M62.81) Pain - Right/Left: Left Pain - part of body: Leg     Time: 1340-1408 PT Time Calculation (min) (ACUTE ONLY): 28 min  Charges:    $Therapeutic Exercise: 23-37 mins PT General Charges $$ ACUTE PT VISIT: 1 Visit                    3:57 PM, 10/12/23 Rosamaria Lints, PT, DPT Physical Therapist - [facility]  [phone] (ASCOM)    Tyreck Bell C 10/12/2023, 3:56 PM

## 2023-10-12 NOTE — Progress Notes (Signed)
 Physical Therapy Treatment Patient Details Name: Donna Fisher MRN: [id number] DOB: [birth year] Today's Date: 10/12/2023   History of Present Illness Donna Fisher is a 57yoF comes to [facility] ED via Dr. Ether Griffins of podiatry after xray showed infection and gas in the left foot. PMH: DM2, diabetic neuropathy, CKD 3,  Left ankle ORIF/fusion. Imaging revealing of evidence of chronic osteomyelitis involving the calcaneus and most of the tibial shaft. 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    PT Comments  Pt short sitting in bed on entry, eating some lunch, visiting with family. Pt has a variety of questions for author, all of which are addressed directly in preparation for DC to home. Author gives details on recommendations for successful care transfers with her [employer]. Pt also asks about some ROM exercises she was performing earlier today on her own. Author plans to return after lunchtime to provide HEP education.    If plan is discharge home, recommend the following: A little help with walking and/or transfers;A little help with bathing/dressing/bathroom;Assistance with cooking/housework;Assist for transportation;Help with stairs or ramp for entrance   Can travel by private vehicle        Equipment Recommendations  Rolling walker (2 wheels)    Recommendations for Other Services       Precautions / Restrictions Precautions Precautions: Fall Precaution/Restrictions Comments: L AKA Restrictions LLE Weight Bearing Per Provider Order: Non weight bearing     Mobility  Bed Mobility Overal bed mobility: Modified Independent, Needs Assistance (no mobility at this time) Bed Mobility: Rolling Rolling: Contact guard assist, Used rails         General bed mobility comments: fairly painful, but able to perform with bed rails.    Transfers                         Ambulation/Gait                   Stairs             Wheelchair Mobility     Tilt Bed    Modified Rankin (Stroke Patients Only)       Balance                                            Communication    Cognition Arousal: Alert Behavior During Therapy: WFL for tasks assessed/performed   PT - Cognitive impairments: No apparent impairments                                Cueing    Exercises Amputee Exercises Gluteal Sets: AROM, Left, 10 reps, Supine, Limitations Gluteal Sets Limitations: towel roll x3 secH Towel Squeeze: AROM, Both, 10 reps, Supine Hip ABduction/ADduction: AROM, Sidelying, 10 reps, Left Hip Flexion/Marching: AROM, Left, Supine, 15 reps Other Exercises Other Exercises: Rt Single Leg bridge 1x10 Other Exercises: Rt sidelying Left hip extension P/ROM stretch: 1x60sec (does nto toelrate range to neutral yet, only to ~20 degrees)    General Comments        Pertinent Vitals/Pain Pain Assessment Pain Assessment: 0-10 Pain Score: 4     Home Living  Prior Function            PT Goals (current goals can now be found in the care plan section) Acute Rehab PT Goals Patient Stated Goal: return to home at DC if possible PT Goal Formulation: With patient Potential to Achieve Goals: Good Progress towards PT goals: Progressing toward goals    Frequency    Min 3X/week      PT Plan      Co-evaluation              AM-PAC PT "6 Clicks" Mobility   Outcome Measure  Help needed turning from your back to your side while in a flat bed without using bedrails?: A Little Help needed moving from lying on your back to sitting on the side of a flat bed without using bedrails?: A Little Help needed moving to and from a bed to a chair (including a wheelchair)?: A Little Help needed standing up from a chair using your arms (e.g., wheelchair or bedside  chair)?: A Little Help needed to walk in hospital room?: A Lot Help needed climbing 3-5 steps with a railing? : A Lot 6 Click Score: 16    End of Session   Activity Tolerance: Patient tolerated treatment well;No increased pain Patient left: in bed;with family/visitor present;with call bell/phone within reach Nurse Communication: Mobility status PT Visit Diagnosis: Other abnormalities of gait and mobility (R26.89);Difficulty in walking, not elsewhere classified (R26.2);Muscle weakness (generalized) (M62.81) Pain - Right/Left: Left Pain - part of body: Leg     Time: 1225-1234 PT Time Calculation (min) (ACUTE ONLY): 9 min  Charges:    $Therapeutic Exercise: 23-37 mins $Self Care/Home Management: 8-22 PT General Charges $$ ACUTE PT VISIT: 1 Visit                    4:01 PM, 10/12/23 Rosamaria Lints, PT, DPT Physical Therapist - [facility]  [phone] (ASCOM)     Jamil Castillo C 10/12/2023, 3:59 PM

## 2023-10-12 NOTE — Progress Notes (Signed)
 Progress Note    10/12/2023 9:30 AM 2 Days Post-Op  Subjective:  Donna Fisher is a 58 yo female now POD #2 from left Above the Knee Amputation. Patient is recovering as expected. She endorses some pain this morning but states it is tolerable. No complaints over night and vitals all remain stable.    Vitals:   10/11/23 2109 10/12/23 0900  BP: (!) 94/54 (!) 120/58  Pulse: 98 98  Resp: 18 17  Temp: 98.4 F (36.9 C) 98.2 F (36.8 C)  SpO2: 100% 100%   Physical Exam: Cardiac:  RRR, Normal S1, S2. No murmurs noted.  Lungs:  Clear on auscultation throughout. No rales, rhonchi and wheezing.  Incisions:  Left lower extremity Above the Knee Amputation. Dressing clean dry and intact. No drainage noted.  Extremities:  Left lower extremity with Open Amputation. Warm to touch. Right lower extremity with palpable DP/PT pulses. Warm to touch.  Abdomen:  Positive bowel sounds throughout, soft non tender and non distended.  Neurologic: AAOX3, Answers questions and follows commands.  CBC    Component Value Date/Time   WBC 12.0 (H) 10/12/2023 0531   RBC 2.63 (L) 10/12/2023 0531   HGB 7.8 (L) 10/12/2023 0531   HGB 12.2 05/10/2017 1001   HCT 25.2 (L) 10/12/2023 0531   HCT 35.5 05/10/2017 1001   PLT 272 10/12/2023 0531   PLT 267 05/10/2017 1001   MCV 95.8 10/12/2023 0531   MCV 88 05/10/2017 1001   MCV 90 11/03/2014 0521   MCH 29.7 10/12/2023 0531   MCHC 31.0 10/12/2023 0531   RDW 14.8 10/12/2023 0531   RDW 14.9 05/10/2017 1001   RDW 12.3 11/03/2014 0521   LYMPHSABS 0.6 (L) 11/06/2020 0444   LYMPHSABS 3.3 11/03/2014 0521   MONOABS 0.3 11/06/2020 0444   MONOABS 0.7 11/03/2014 0521   EOSABS 0.0 11/06/2020 0444   EOSABS 0.4 11/03/2014 0521   BASOSABS 0.0 11/06/2020 0444   BASOSABS 0.1 11/03/2014 0521    BMET    Component Value Date/Time   NA 140 10/12/2023 0531   NA 136 11/03/2014 0521   K 4.2 10/12/2023 0531   K 3.9 11/03/2014 0521   CL 111 10/12/2023 0531   CL 105  11/03/2014 0521   CO2 25 10/12/2023 0531   CO2 29 11/03/2014 0521   GLUCOSE 157 (H) 10/12/2023 0531   GLUCOSE 281 (H) 11/03/2014 0521   BUN 22 (H) 10/12/2023 0531   BUN 18 11/03/2014 0521   CREATININE 1.53 (H) 10/12/2023 0531   CREATININE 0.87 11/03/2014 0521   CALCIUM 8.3 (L) 10/12/2023 0531   CALCIUM 8.0 (L) 11/03/2014 0521   GFRNONAA 39 (L) 10/12/2023 0531   GFRNONAA >60 11/03/2014 0521   GFRAA 39 (L) 05/29/2019 0831   GFRAA >60 11/03/2014 0521    INR    Component Value Date/Time   INR 1.1 11/03/2020 1406     Intake/Output Summary (Last 24 hours) at 10/12/2023 0930 Last data filed at 10/12/2023 0400 Gross per 24 hour  Intake 420 ml  Output --  Net 420 ml     Assessment/Plan:  58 y.o. female is s/p  Left Above the knee amputation.  2 Days Post-Op   PLAN OOB to chair  PT/OT consults  Pain medication PRN First Dressing change by Vascular Surgery on Thursday 10/13/23. Okay to discharge to Rehab after first dressing change. Patient wishes to go home.    DVT prophylaxis:  Lovenox 40 mg Sq Q24    Amyre Segundo R Rainen [facility] 10/12/2023 9:30 AM

## 2023-10-12 NOTE — Progress Notes (Signed)
 PROGRESS NOTE    Donna Fisher  HYQ:[id number] DOB: 10-10-1965 DOA: 10/05/2023 PCP: Abram Sander, MD  [id number]  LOS: 7 days   Brief hospital course:  Donna Fisher is a 58 y.o. female with medical history significant of type 2 diabetes, diabetic neuropathy, stage III chronic kidney disease, who was sent to ED by Dr. Ether Griffins of podiatry after xray showed infection and gas in the left foot.  3/22: Hemodynamically stable, AKA on Monday  3/23: Remains stable, improving creatinine.  Going to the OR tomorrow for AKA  3/24: Going for AKA today  3/25: S/p AKA yesterday, tolerated the procedure well.  Hemoglobin decreased to 7.8 and some leukocytosis today which likely is reactive.  Starting on iron supplement. Will continue antibiotics for 1 more day.   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 Wyn Quaker and removal of hardware by Dr. Joice Lofts on 3/19 --MRI to her left lower extremity revealing chronic osteomyelitis of her tibia.  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 --f/u Surgical cx -currently no growth -- Completed the course of antibiotic  Hypotension --cont home midodrine   DM2 --BG mostly in 100's --hold long-acting insulin for now --ACHS and SSI   AKI on CKD 3b --CR 2.04 on presentation, slight worsening of creatinine with initial improvement today -Ordered some gentle IV fluid -Monitor renal function  Diabetic neuropathy --cont home gabapentin  Dizziness and nausea --worse with movement, likely vertigo --trial meclizine PRN  Anemia.  Hemoglobin decreased to 7.8 after the procedure, likely due to blood loss during surgery.  Starting on supplement. -Continue to monitor -Transfuse if below 7  DVT prophylaxis: Lovenox  SQ Code Status: Full code  Family Communication:  Level of care: Med-Surg Dispo:   The patient is from: home Anticipated d/c is to: home Anticipated d/c date is: 1-2 days   Subjective and Interval History:  Patient was seen and examined today.  She was having 6 out of 10 pain and stating that it is bearable with current regimen.  Objective: Vitals:   10/11/23 1638 10/11/23 2109 10/12/23 0900 10/12/23 1503  BP: (!) 95/58 (!) 94/54 (!) 120/58 122/62  Pulse: 94 98 98 100  Resp: 14 18 17 18   Temp: 97.9 F (36.6 C) 98.4 F (36.9 C) 98.2 F (36.8 C)   TempSrc: Oral Oral Oral   SpO2: 99% 100% 100% 99%  Weight:      Height:        Intake/Output Summary (Last 24 hours) at 10/12/2023 1624 Last data filed at 10/12/2023 1046 Gross per 24 hour  Intake 300 ml  Output --  Net 300 ml   Filed Weights   10/05/23 0625 10/05/23 1540 10/10/23 1000  Weight: 73.9 kg 74.8 kg 74.8 kg    Examination:   General.  Well-developed lady, in no acute distress. Pulmonary.  Lungs clear bilaterally, normal respiratory effort. CV.  Regular rate and rhythm, no JVD, rub or murmur. Abdomen.  Soft, nontender, nondistended, BS positive. CNS.  Alert and oriented .  No focal neurologic deficit. Extremities.  Left AKA Psychiatry.  Judgment and insight appears normal.    Data Reviewed: I have personally reviewed labs and imaging studies  Time spent: 40 minutes  This record has been created  using Conservation officer, historic buildings. Errors have been sought and corrected,but may not always be located. Such creation errors do not reflect on the standard of care.   Arnetha Courser, MD [facility] If 7PM-7AM, please contact night-coverage 10/12/2023, 4:24 PM

## 2023-10-12 NOTE — Progress Notes (Signed)
 Noted no BM since 3/17. MD aware. Patient has bowel regimen ordered WCTM.

## 2023-10-12 NOTE — Progress Notes (Signed)
 Occupational Therapy Treatment Patient Details Name: Donna Fisher MRN: [id number] DOB: 1965/09/25 Today's Date: 10/12/2023   History of present illness Donna Fisher is a 57yoF comes [facility] ED via Dr. Ether Griffins of podiatry after xray showed infection and gas in the left foot. PMH: DM2, diabetic neuropathy, CKD 3,  Left ankle ORIF/fusion. Imaging revealing of evidence of chronic osteomyelitis involving the calcaneus and most of the tibial shaft. 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   OT comments  Upon entering the room, pt supine in bed and agreeable to OT intervention. Pt reports some discomfort in residual limb but endorses she has been rubbing and lightly tapping for pain management today. Pt performs supine >sit with increased time but no physical assistance. Pt stands from bed with CGA and transfers to [facility]. Pt then stands and returns to sit on EOB in same manner. Pt discusses home set up and appears to have all needed equipment for safety. OT recommends pt uses skin inspection mirror to look at B LEs once returning home. Pt returns to supine without assistance. Call bell and all needed items within reach upon exiting the room.       If plan is discharge home, recommend the following:  A little help with walking and/or transfers;A little help with bathing/dressing/bathroom;Help with stairs or ramp for entrance;Assistance with cooking/housework   Equipment Recommendations  None recommended by OT       Precautions / Restrictions Precautions Precautions: Fall Recall of Precautions/Restrictions: Intact Precaution/Restrictions Comments: L AKA Restrictions LLE Weight Bearing Per Provider Order: Non weight bearing       Mobility Bed Mobility Overal bed mobility: Modified Independent                  Transfers Overall transfer level:  Needs assistance Equipment used: Rolling walker (2 wheels) Transfers: Sit to/from Stand, Bed to chair/wheelchair/BSC Sit to Stand: Contact guard assist     Step pivot transfers: Contact guard assist           Balance Overall balance assessment: Needs assistance Sitting-balance support: No upper extremity supported, Feet supported Sitting balance-Leahy Scale: Normal     Standing balance support: Bilateral upper extremity supported, Reliant on assistive device for balance Standing balance-Leahy Scale: Fair                             ADL either performed or assessed with clinical judgement   ADL Overall ADL's : Needs assistance/impaired                         Toilet Transfer: Contact guard assist;Cueing for sequencing;Cueing for safety;BSC/3in1;Rolling walker (2 wheels)                   Communication Communication Communication: No apparent difficulties   Cognition Arousal: Alert Behavior During Therapy: WFL for tasks assessed/performed Cognition: No apparent impairments                               Following commands: Intact        Cueing   Cueing Techniques: Verbal cues  Exercises              Pertinent Vitals/ Pain  Pain Assessment Pain Assessment: 0-10 Pain Score: 2  Pain Location: L residual limb Pain Descriptors / Indicators: Discomfort Pain Intervention(s): Limited activity within patient's tolerance, Repositioned  Home Living                                              Frequency  Min 3X/week        Progress Toward Goals  OT Goals(current goals can now be found in the care plan section)  Progress towards OT goals: Progressing toward goals      AM-PAC OT "6 Clicks" Daily Activity     Outcome Measure   Help from another person eating meals?: None Help from another person taking care of personal grooming?: None Help from another person toileting, which includes  using toliet, bedpan, or urinal?: A Little Help from another person bathing (including washing, rinsing, drying)?: A Little Help from another person to put on and taking off regular upper body clothing?: None Help from another person to put on and taking off regular lower body clothing?: A Little 6 Click Score: 21    End of Session Equipment Utilized During Treatment: Rolling walker (2 wheels)  OT Visit Diagnosis: Other abnormalities of gait and mobility (R26.89);Pain   Activity Tolerance Patient tolerated treatment well   Patient Left in bed;with call bell/phone within reach;with bed alarm set   Nurse Communication Mobility status        Time: 1610-9604 OT Time Calculation (min): 21 min  Charges: OT General Charges $OT Visit: 1 Visit OT Treatments $Self Care/Home Management : 8-22 mins  [name], MS, OTR/L , CBIS ascom [phone]  10/12/23, 4:27 PM

## 2023-10-13 ENCOUNTER — Ambulatory Visit (INDEPENDENT_AMBULATORY_CARE_PROVIDER_SITE_OTHER): Payer: Medicare HMO | Admitting: Vascular Surgery

## 2023-10-13 ENCOUNTER — Encounter (INDEPENDENT_AMBULATORY_CARE_PROVIDER_SITE_OTHER): Payer: Medicare HMO

## 2023-10-13 DIAGNOSIS — E118 Type 2 diabetes mellitus with unspecified complications: Secondary | ICD-10-CM | POA: Diagnosis not present

## 2023-10-13 LAB — BASIC METABOLIC PANEL WITH GFR
Anion gap: 5 (ref 5–15)
BUN: 20 mg/dL (ref 6–20)
CO2: 24 mmol/L (ref 22–32)
Calcium: 8.2 mg/dL — ABNORMAL LOW (ref 8.9–10.3)
Chloride: 110 mmol/L (ref 98–111)
Creatinine, Ser: 1.49 mg/dL — ABNORMAL HIGH (ref 0.44–1.00)
GFR, Estimated: 41 mL/min — ABNORMAL LOW (ref >60)
Glucose, Bld: 173 mg/dL — ABNORMAL HIGH (ref 70–99)
Potassium: 4.2 mmol/L (ref 3.5–5.1)
Sodium: 139 mmol/L (ref 135–145)

## 2023-10-13 LAB — GLUCOSE, CAPILLARY
Glucose-Capillary: 146 mg/dL — ABNORMAL HIGH (ref 70–99)
Glucose-Capillary: 136 mg/dL — ABNORMAL HIGH (ref 70–99)
Glucose-Capillary: 162 mg/dL — ABNORMAL HIGH (ref 70–99)
Glucose-Capillary: 146 mg/dL — ABNORMAL HIGH (ref 70–99)

## 2023-10-13 LAB — CBC
HCT: 22 % — ABNORMAL LOW (ref 36.0–46.0)
Hemoglobin: 6.9 g/dL — ABNORMAL LOW (ref 12.0–15.0)
MCH: 29.7 pg (ref 26.0–34.0)
MCHC: 31.4 g/dL (ref 30.0–36.0)
MCV: 94.8 fL (ref 80.0–100.0)
Platelets: 264 10*3/uL (ref 150–400)
RBC: 2.32 MIL/uL — ABNORMAL LOW (ref 3.87–5.11)
RDW: 15.4 % (ref 11.5–15.5)
WBC: 11.7 10*3/uL — ABNORMAL HIGH (ref 4.0–10.5)
nRBC: 0 % (ref 0.0–0.2)

## 2023-10-13 LAB — TYPE AND SCREEN
ABO/RH(D): A POS
Antibody Screen: NEGATIVE

## 2023-10-13 LAB — PREPARE RBC (CROSSMATCH)

## 2023-10-13 MED ORDER — SODIUM CHLORIDE 0.9% IV SOLUTION: Freq: Once | INTRAVENOUS | Status: AC

## 2023-10-13 MED ORDER — OXYCODONE-ACETAMINOPHEN 5-325 MG PO TABS
1.0000 | ORAL_TABLET | Freq: Four times a day (QID) | ORAL | Status: DC | PRN
  Administered 2023-10-13 – 2023-10-14 (×2): 1 via ORAL
  Filled 2023-10-13 (×2): qty 1

## 2023-10-13 NOTE — Progress Notes (Signed)
 Progress Note    10/13/2023 10:12 AM 3 Days Post-Op  Subjective:   Donna Fisher is a 58 yo female now POD #3 from left Above the Knee Amputation. Patient is recovering as expected. She endorses some pain this morning but states it is tolerable.  Patient endorses dressing came off last night and was rewrapped by nursing staff.  No complaints over night and vitals all remain stable.    Vitals:   10/13/23 0430 10/13/23 0838  BP: 103/62 (!) 102/51  Pulse: (!) 109 94  Resp: 18 16  Temp: 98.1 F (36.7 C) 97.7 F (36.5 C)  SpO2: 97% 100%   Physical Exam: Cardiac:  RRR, Normal S1, S2. No murmurs noted.  Lungs:  Clear on auscultation throughout. No rales, rhonchi and wheezing.  Incisions:  Left lower extremity Above the Knee Amputation. Dressing clean dry and intact. No drainage noted.  Extremities:  Left lower extremity with Open Amputation. Warm to touch. Right lower extremity with palpable DP/PT pulses. Warm to touch.  Abdomen:  Positive bowel sounds throughout, soft non tender and non distended.  Neurologic: AAOX3, Answers questions and follows commands.  CBC    Component Value Date/Time   WBC 11.7 (H) 10/13/2023 0524   RBC 2.32 (L) 10/13/2023 0524   HGB 6.9 (L) 10/13/2023 0524   HGB 12.2 05/10/2017 1001   HCT 22.0 (L) 10/13/2023 0524   HCT 35.5 05/10/2017 1001   PLT 264 10/13/2023 0524   PLT 267 05/10/2017 1001   MCV 94.8 10/13/2023 0524   MCV 88 05/10/2017 1001   MCV 90 11/03/2014 0521   MCH 29.7 10/13/2023 0524   MCHC 31.4 10/13/2023 0524   RDW 15.4 10/13/2023 0524   RDW 14.9 05/10/2017 1001   RDW 12.3 11/03/2014 0521   LYMPHSABS 0.6 (L) 11/06/2020 0444   LYMPHSABS 3.3 11/03/2014 0521   MONOABS 0.3 11/06/2020 0444   MONOABS 0.7 11/03/2014 0521   EOSABS 0.0 11/06/2020 0444   EOSABS 0.4 11/03/2014 0521   BASOSABS 0.0 11/06/2020 0444   BASOSABS 0.1 11/03/2014 0521    BMET    Component Value Date/Time   NA 139 10/13/2023 0524   NA 136 11/03/2014 0521   K 4.2  10/13/2023 0524   K 3.9 11/03/2014 0521   CL 110 10/13/2023 0524   CL 105 11/03/2014 0521   CO2 24 10/13/2023 0524   CO2 29 11/03/2014 0521   GLUCOSE 173 (H) 10/13/2023 0524   GLUCOSE 281 (H) 11/03/2014 0521   BUN 20 10/13/2023 0524   BUN 18 11/03/2014 0521   CREATININE 1.49 (H) 10/13/2023 0524   CREATININE 0.87 11/03/2014 0521   CALCIUM 8.2 (L) 10/13/2023 0524   CALCIUM 8.0 (L) 11/03/2014 0521   GFRNONAA 41 (L) 10/13/2023 0524   GFRNONAA >60 11/03/2014 0521   GFRAA 39 (L) 05/29/2019 0831   GFRAA >60 11/03/2014 0521    INR    Component Value Date/Time   INR 1.1 11/03/2020 1406     Intake/Output Summary (Last 24 hours) at 10/13/2023 1012 Last data filed at 10/13/2023 0533 Gross per 24 hour  Intake 956.72 ml  Output 1 ml  Net 955.72 ml     Assessment/Plan:  58 y.o. female is s/p   Left Above the knee amputation.  3 Days Post-Op   PLAN Okay per vascular surgery for patient to discharge home today Patient's first dressing change was done by vascular surgery this morning Thursday, 10/13/2023.  No complications to note staples are in place.  Dressing needs  to be changed daily.  Instructions for dressing change will be in discharge instructions. Patient to follow-up in vein and vascular surgery in 3 weeks for staple removal as scheduled.  DVT prophylaxis:  Lovenox 40 mg Sq Q24    Adaley Kiene R [facility] 10/13/2023 10:12 AM

## 2023-10-13 NOTE — Care Management Important Message (Signed)
 Important Message  Patient Details  Name: Donna Fisher MRN: [id number] Date of Birth: April 10, 1966   Important Message Given:  Yes - Medicare IM     Deby Adger, Stephan Minister 10/13/2023, 2:42 PM

## 2023-10-13 NOTE — Progress Notes (Signed)
  PROGRESS NOTE    Donna Fisher  WGN:[id number] DOB: 02/03/1966 DOA: 10/05/2023 PCP: Donna Sander, MD  [id number]  LOS: 8 days   Brief hospital course:   Assessment & Plan: Donna Fisher is a 58 y.o. female with medical history significant of type 2 diabetes, diabetic neuropathy, stage III chronic kidney disease, who was sent to ED by Donna Fisher of podiatry after xray showed infection and gas in the left foot.    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 Wyn Fisher and removal of hardware by Donna Fisher on 3/19 --MRI to her left lower extremity revealing chronic osteomyelitis of her tibia.  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 --f/u Surgical cx -currently no growth -- Completed the course of antibiotic --dressing change daily per order   Hypotension --cont midodrine   DM2 --BG mostly in 100's --hold long-acting insulin for now --ACHS and SSI   AKI on CKD 3b --CR 2.04 on presentation, slight worsening of creatinine with initial improvement  -s/p some gentle IV fluid --monitor Cr   Diabetic neuropathy --cont home gabapentin   Dizziness and nausea --worse with movement, likely vertigo --trial meclizine PRN   Acute blood loss Anemia --1u pRBC today for Hgb 6.9   DVT prophylaxis: Lovenox SQ Code Status: Full code  Family Communication:  Level of care: Med-Surg Dispo:   The patient is from: home Anticipated d/c is to: home Anticipated d/c date is: tomorrow   Subjective and Interval History:  Pt reported normal oral intake and BM.   Objective: Vitals:   10/13/23 0838 10/13/23 1435 10/13/23 1459 10/13/23 1747  BP: (!) 102/51 125/71 128/66 (!) 113/59  Pulse: 94 92 89 95  Resp: 16 16 16 14   Temp: 97.7 F (36.5 C) 98.2 F (36.8 C) 98.2 F (36.8 C) 98.2 F (36.8 C)  TempSrc: Oral Oral Oral Oral  SpO2: 100% 100% 98% 100%  Weight:      Height:         Intake/Output Summary (Last 24 hours) at 10/13/2023 2006 Last data filed at 10/13/2023 1900 Gross per 24 hour  Intake 1700.22 ml  Output 1 ml  Net 1699.22 ml   Filed Weights   10/05/23 0625 10/05/23 1540 10/10/23 1000  Weight: 73.9 kg 74.8 kg 74.8 kg    Examination:   Constitutional: NAD, AAOx3 HEENT: conjunctivae and lids normal, EOMI CV: No cyanosis.   RESP: normal respiratory effort, on RA Extremities: left AKA Neuro: II - XII grossly intact.   Psych: Normal mood and affect.  Appropriate judgement and reason   Data Reviewed: I have personally reviewed labs and imaging studies  Time spent: 50 minutes  Donna Priestly, MD [facility] If 7PM-7AM, please contact night-coverage 10/13/2023, 8:06 PM

## 2023-10-13 NOTE — Progress Notes (Signed)
 Called to bedside by patient,dressing seen on the floor.Patient stated that it fell off while she was up pulling her underwear up.Left AKA dressed  with kerlix.

## 2023-10-13 NOTE — TOC Progression Note (Addendum)
 Transition of Care [facility]) - Progression Note    Patient Details  Name: Donna Fisher MRN: [id number] Date of Birth: 1966/04/01  [facility]) CM/SW Contact  [name], LCSW Phone Number: 10/13/2023, 9:19 AM  Clinical Narrative:   Ordered RW through [employer].  10:57 am: Per [employer], patient received a rollator in April 2022 so insurance will not pay for a RW. [employer] liaison spoke to patient about private pay price and she declined.  Expected Discharge Plan: Home w Home Health Services Barriers to Discharge: Continued Medical Work up  Expected Discharge Plan and Services   Discharge Planning Services: CM Consult   Living arrangements for the past 2 months: Single Family Home                           HH Arranged: OT, PT HH Agency: Other - See comment Cindie Laroche) Date HH Agency Contacted: 10/10/23 Time HH Agency Contacted: 1517 Representative spoke with at [facility] Agency: [place] [phone]   Social Determinants of Health (SDOH) Interventions SDOH Screenings   Food Insecurity: No Food Insecurity (10/05/2023)  Housing: Low Risk  (10/06/2023)  Transportation Needs: No Transportation Needs (10/05/2023)  Utilities: Not At Risk (10/05/2023)  Depression (PHQ2-9): Low Risk  (11/14/2020)  Financial Resource Strain: Low Risk  (08/11/2023)   Received from [facility]  Social Connections: Unknown (05/28/2019)  Stress: No Stress Concern Present (05/28/2019)  Tobacco Use: High Risk (10/04/2023)    Readmission Risk Interventions     No data to display

## 2023-10-13 NOTE — Plan of Care (Signed)
  Problem: Coping: Goal: Ability to adjust to condition or change in health will improve Outcome: Progressing   Problem: Education: Goal: Ability to describe self-care measures that may prevent or decrease complications (Diabetes Survival Skills Education) will improve Outcome: Progressing Goal: Individualized Educational Video(s) Outcome: Progressing   Problem: Fluid Volume: Goal: Ability to maintain a balanced intake and output will improve Outcome: Progressing   Problem: Health Behavior/Discharge Planning: Goal: Ability to identify and utilize available resources and services will improve Outcome: Progressing Goal: Ability to manage health-related needs will improve Outcome: Progressing

## 2023-10-14 DIAGNOSIS — E118 Type 2 diabetes mellitus with unspecified complications: Secondary | ICD-10-CM | POA: Diagnosis not present

## 2023-10-14 LAB — CBC
HCT: 27.9 % — ABNORMAL LOW (ref 36.0–46.0)
Hemoglobin: 9.1 g/dL — ABNORMAL LOW (ref 12.0–15.0)
MCH: 29.4 pg (ref 26.0–34.0)
MCHC: 32.6 g/dL (ref 30.0–36.0)
MCV: 90.3 fL (ref 80.0–100.0)
Platelets: 245 10*3/uL (ref 150–400)
RBC: 3.09 MIL/uL — ABNORMAL LOW (ref 3.87–5.11)
RDW: 17.5 % — ABNORMAL HIGH (ref 11.5–15.5)
WBC: 10.2 10*3/uL (ref 4.0–10.5)
nRBC: 0 % (ref 0.0–0.2)

## 2023-10-14 LAB — BASIC METABOLIC PANEL WITH GFR
Anion gap: 7 (ref 5–15)
BUN: 17 mg/dL (ref 6–20)
CO2: 26 mmol/L (ref 22–32)
Calcium: 8.2 mg/dL — ABNORMAL LOW (ref 8.9–10.3)
Chloride: 109 mmol/L (ref 98–111)
Creatinine, Ser: 1.17 mg/dL — ABNORMAL HIGH (ref 0.44–1.00)
GFR, Estimated: 54 mL/min — ABNORMAL LOW (ref >60)
Glucose, Bld: 143 mg/dL — ABNORMAL HIGH (ref 70–99)
Potassium: 4.1 mmol/L (ref 3.5–5.1)
Sodium: 142 mmol/L (ref 135–145)

## 2023-10-14 LAB — TYPE AND SCREEN
ABO/RH(D): A POS
Antibody Screen: NEGATIVE
Unit division: 0

## 2023-10-14 LAB — MAGNESIUM: Magnesium: 2.2 mg/dL (ref 1.7–2.4)

## 2023-10-14 LAB — BPAM RBC
Blood Product Expiration Date: 202504262359
ISSUE DATE / TIME: 202503271439
Unit Type and Rh: 6200

## 2023-10-14 LAB — GLUCOSE, CAPILLARY: Glucose-Capillary: 113 mg/dL — ABNORMAL HIGH (ref 70–99)

## 2023-10-14 MED ORDER — OXYCODONE-ACETAMINOPHEN 5-325 MG PO TABS: 1.0000 | ORAL_TABLET | Freq: Four times a day (QID) | ORAL | 0 refills | Status: AC | PRN

## 2023-10-14 MED ORDER — BACITRACIN ZINC 500 UNIT/GM EX OINT: TOPICAL_OINTMENT | Freq: Every day | CUTANEOUS | 1 refills | Status: DC

## 2023-10-14 MED ORDER — BACITRACIN ZINC 500 UNIT/GM EX OINT: TOPICAL_OINTMENT | Freq: Every day | CUTANEOUS | Status: DC

## 2023-10-14 NOTE — Discharge Summary (Signed)
 Physician Discharge Summary   Donna Fisher  female DOB: 08-05-1965  JWJ:[id number]  PCP: Abram Sander, MD  Admit date: 10/05/2023 Discharge date: 10/14/2023  Admitted From: home Disposition:  home Home Health: Yes CODE STATUS: Full code  Discharge Instructions     Diet Carb Modified   Complete by: As directed    Discharge wound care:   Complete by: As directed    Dressing Changes For left Amputation:   DAILY DRESSING CHANGE   Remove old Dressing   Cover staples with Xeroform guaze then   Cover with 4X4's  then   Cover with ABD Pads X2 then   Wrap with Kerliex then   Wrap Snugly with 6 Inch Ace Bandage    Apply Bacitracin to right foot wounds (2) every day, then cover with foam dressings.  Change foam dressing every 3 days or when soiling [facility] Course:  For full details, please see H&P, progress notes, consult notes and ancillary notes.  Briefly,  Donna Fisher is a 58 y.o. female with medical history significant of type 2 diabetes, diabetic neuropathy, stage III chronic kidney disease, who was sent to ED by Dr. Ether Griffins of podiatry after xray showed infection and gas in the left foot.    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 Wyn Quaker and removal of hardware by Dr. 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  --completed 8 days of Vanc, cefepime and flagyl --dressing change daily per order above, husband taught by RN --follow-up in vein and vascular surgery in 3 weeks for staple removal as scheduled.    Hypotension --cont midodrine   DM2 --A1c 7.3.  BG mostly in 100's.   --long-acting insulin held during hospitalization and pt received ACHS and SSI.   --pt reported eating differently at home, and has been working with her PCP to control her diabetes.  Pt has  Josephine Igo and wants to go back to managing her insulin as she did PTA.   AKI on CKD 3b --CR 2.04 on presentation. -s/p some gentle IV fluid --Cr 1.17 prior to discharge.   Diabetic neuropathy --cont home gabapentin   Acute blood loss Anemia --received 1u pRBC for Hgb 6.9   Unless noted above, medications under "STOP" list are ones pt was not taking PTA.  Discharge Diagnoses:  Principal Problem:   Diabetic foot (HCC)   30 Day Unplanned Readmission Risk Score    Flowsheet Row ED to Hosp-Admission (Current) from 10/05/2023 in [facility]  30 Day Unplanned Readmission Risk Score (%) 19.23 Filed at 10/14/2023 0801       This score is the patient's risk of an unplanned readmission within 30 days of being discharged (0 -100%). The score is based on dignosis, age, lab data, medications, orders, and past utilization.   Low:  0-14.9   Medium: 15-21.9   High: 22-29.9   Extreme: 30 and above         Discharge Instructions:  Allergies as of 10/14/2023       Reactions   Atorvastatin Hives   Penicillins Swelling, Other (See Comments), Anaphylaxis   .Has patient had a PCN reaction causing immediate rash, facial/tongue/throat swelling, SOB or lightheadedness with hypotension: No Has patient had a PCN reaction  causing severe rash involving mucus membranes or skin necrosis: No Has patient had a PCN reaction that required hospitalization: No Has patient had a PCN reaction occurring within the last 10 years: No If all of the above answers are "NO", then may proceed with Cephalosporin use. Around 2014 or earlier, she had severe rash and throat swelling to penicillins   Codeine Itching, Other (See Comments)   Ceftriaxone Rash   Linezolid Nausea Only        Medication List     STOP taking these medications    NovoLOG FlexPen 100 UNIT/ML FlexPen Generic drug: insulin aspart       TAKE these medications    aspirin EC 325 MG tablet Take 325  mg by mouth daily.   bacitracin ointment Apply topically daily.   calcitRIOL 0.25 MCG capsule Commonly known as: ROCALTROL Take 0.25 mcg by mouth daily.   empagliflozin 25 MG Tabs tablet Commonly known as: JARDIANCE Take 25 mg by mouth daily.   EPINEPHrine 0.3 mg/0.3 mL Soaj injection Commonly known as: EPI-PEN Inject 0.3 mg into the muscle daily as needed for anaphylaxis.   ezetimibe 10 MG tablet Commonly known as: ZETIA Take 10 mg by mouth daily.   FreeStyle Pecan Gap 2 Reader [employer] Use as directed six times a day Dx E11.52,   FreeStyle Libre 2 Sensor Misc Use 1 device as directed every two weeks to measure blood sugar  DX E11.65   gabapentin 300 MG capsule Commonly known as: NEURONTIN Take 300 mg by mouth 2 (two) times daily.   hydrOXYzine 25 MG tablet Commonly known as: ATARAX Take 25 mg by mouth 3 (three) times daily as needed for anxiety or itching (Hives).   Lantus SoloStar 100 UNIT/ML Solostar Pen Generic drug: insulin glargine Inject 30 Units into the skin at bedtime.   lidocaine-prilocaine cream Commonly known as: EMLA Apply 1 Application topically daily as needed (pain).   metFORMIN 500 MG 24 hr tablet Commonly known as: GLUCOPHAGE-XR Take 2,000 mg by mouth daily with supper.   midodrine 10 MG tablet Commonly known as: PROAMATINE Take 1 tablet (10 mg total) by mouth 3 (three) times daily as needed (low blood pressure).   NovoLOG 70/30 FlexPen (70-30) 100 UNIT/ML FlexPen Generic drug: insulin aspart protamine - aspart Inject 40-45 Units into the skin See admin instructions. 40 units with breakfast, 40 units with lunch, 45 units with supper   ondansetron 4 MG disintegrating tablet Commonly known as: ZOFRAN-ODT Take 4 mg by mouth every 8 (eight) hours as needed for nausea or vomiting.   oxyCODONE-acetaminophen 5-325 MG tablet Commonly known as: Percocet Take 1 tablet by mouth every 6 (six) hours as needed for up to 7 days for severe pain (pain score  7-10). Max 6 tabs per day What changed: how much to take   Ozempic (2 MG/DOSE) 8 MG/3ML Sopn Generic drug: Semaglutide (2 MG/DOSE) Inject 2 mg into the skin once a week.   tolterodine 4 MG 24 hr capsule Commonly known as: DETROL LA Take 1 capsule (4 mg total) by mouth daily.               Durable Medical Equipment  (From admission, onward)           Start     Ordered   10/12/23 1625  For home use only DME Walker rolling  Once       Question Answer Comment  Walker: With 5 Inch Wheels   Patient needs a walker to treat with  the following condition Above knee amputation of left lower extremity (HCC)      10/12/23 1624              Discharge Care Instructions  (From admission, onward)           Start     Ordered   10/14/23 0000  Discharge wound care:       Comments: Dressing Changes For left Amputation:   DAILY DRESSING CHANGE   Remove old Dressing   Cover staples with Xeroform guaze then   Cover with 4X4's  then   Cover with ABD Pads X2 then   Wrap with Kerliex then   Wrap Snugly with 6 Inch Ace Bandage    Apply Bacitracin to right foot wounds (2) every day, then cover with foam dressings.  Change foam dressing every 3 days or when soiling - -   10/14/23 1478             Follow-up Information     Abram Sander, MD. Go on 10/25/2023.   Specialty: Family Medicine Why: Hospital follow up. Go at 10:00am. Contact information: [address] [place] [address] [phone]         Annice Needy, MD. Go on 11/01/2023.   Specialties: Vascular Surgery, Radiology, Interventional Cardiology Why: for staple removal. Go at 3:15pm. Contact information: [address] [facility] [address] [place] [address] [phone]                 Allergies  Allergen Reactions   Atorvastatin Hives   Penicillins Swelling, Other (See Comments) and Anaphylaxis    .Has patient had a PCN reaction causing immediate rash, facial/tongue/throat  swelling, SOB or lightheadedness with hypotension: No  Has patient had a PCN reaction causing severe rash involving mucus membranes or skin necrosis: No  Has patient had a PCN reaction that required hospitalization: No  Has patient had a PCN reaction occurring within the last 10 years: No  If all of the above answers are "NO", then may proceed with Cephalosporin use.  Around 2014 or earlier, she had severe rash and throat swelling to penicillins   Codeine Itching and Other (See Comments)   Ceftriaxone Rash   Linezolid Nausea Only     The results of significant diagnostics from this hospitalization (including imaging, microbiology, ancillary and laboratory) are listed below for reference.   Consultations:   Procedures/Studies: MR TIBIA FIBULA LEFT W WO CONTRAST Result Date: 10/06/2023 CLINICAL DATA:  Soft tissue infection suspected, lower leg, xray done History of poorly controlled diabetes with peripheral neuropathy and retrograde intramedullary TTC nailing of the left hindfoot and tibia for trimalleolar fracture. Patient underwent hardware removal and below the knee amputation yesterday. EXAM: MRI OF LOWER LEFT EXTREMITY WITHOUT AND WITH CONTRAST TECHNIQUE: Multiplanar, multisequence MR imaging of the left lower leg was performed both before and after administration of intravenous contrast. CONTRAST:  7mL GADAVIST GADOBUTROL 1 MMOL/ML IV SOLN COMPARISON:  Radiographs 10/05/2023.  Left ankle CT 10/14/2022. FINDINGS: Bones/Joint/Cartilage Interval removal of the tibial intramedullary nail and amputation through the distal tibia and fibula. There is low signal within the medullary cavity of the left femur at the site of the removed intramedullary nail with surrounding nonspecific bone marrow edema and enhancement. Areas of endosteal thinning anteriorly and medially, as seen on preoperative radiographs. No gross cortical destruction or periosteal fluid collections are identified. There is no  evidence of acute fracture or dislocation. The fibula appears unremarkable. Incomplete visualization of  the left knee without significant findings. Ligaments Not relevant for exam/indication. Muscles and Tendons Nonspecific T2 hyperintensity throughout the left lower leg muscles without focal fluid collection, significant atrophy or focal fluid collection. The patellar tendon appears intact. Soft tissues Postsurgical changes distally related to recent amputation with small amount of gas in the soft tissues. There is mild soft tissue enhancement posteriorly in the operative bed. No drainable fluid collection or unexpected foreign body identified. Incidental imaging of the right lower leg demonstrates no significant findings. IMPRESSION: 1. Postsurgical changes from recent interval removal of the tibial intramedullary nail and amputation through the distal tibia and fibula. 2. Low signal within the medullary cavity of the left femur at the site of the removed intramedullary nail with surrounding nonspecific bone marrow edema and enhancement. This could reflect postoperative change, although is concerning for possible chronic osteomyelitis given the radiographic findings. Correlate with surgical impression and consider short-term follow-up. 3. No gross cortical destruction or periosteal fluid collections identified. 4. Postsurgical changes in the distal soft tissues without drainable fluid collection or unexpected foreign body. 5. 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 Electronically Signed   By: Carey Bullocks M.D.   On: 10/06/2023 16:27   DG Tibia/Fibula Left Result Date: 10/05/2023 CLINICAL DATA:  Chronic osteomyelitis of the left tibia. EXAM: LEFT TIBIA AND FIBULA - 2 VIEW COMPARISON:  Left foot radiograph dated 10/05/2023. FINDINGS: There is no acute fracture or dislocation. The bones are osteopenic.  Moderate arthritic changes of the left knee. Old healed fracture deformities of the distal tibia and fibula. An intramedullary nail within the tibia extends into the talus and calcaneus. Areas of bone demineralization and lucency along the tibial nail may represent loosening. An infectious process is not excluded. Soft tissue gas noted over the medial ankle and posterior to the distal calf concerning for an infectious process. IMPRESSION: 1. No acute fracture or dislocation. 2. Old healed fracture deformities of the distal tibia and fibula. 3. Lucency within the tibial along the intramedullary nail concerning for infection or loosening. 4. Soft tissue gas concerning for an infectious process or necrotizing fasciitis. Electronically Signed   By: Elgie Collard M.D.   On: 10/05/2023 12:34   DG Foot Complete Left Result Date: 10/05/2023 CLINICAL DATA:  Evaluation for osteomyelitis. Ulcer on the bottom of the foot for 2 weeks. History of diabetes. EXAM: LEFT FOOT - COMPLETE 3+ VIEW COMPARISON:  05/27/2019 FINDINGS: Amputation of the 1st-3rd toes at the level of the proximal phalanges. No radiographic evidence of osteomyelitis in the foot. Or if of the ankle and hindfoot. Swelling and plantar soft tissue ulcer superficial to the intramedullary rod. Locules of soft tissue gas about the ankle. Extensive degenerative change about the ankle with loss of cortical definition of the articular surface of the tibia, calcaneus, and talus. This could be degenerative however osteomyelitis/septic arthritis are not excluded. IMPRESSION: 1. Plantar soft tissue ulcer superficial to the intramedullary rod of the ankle. Locules of soft tissue gas about the ankle. Necrotizing infection is not excluded. 2. Extensive degenerative change about the ankle with loss of cortical definition of the articular surface of the tibia, calcaneus, and talus. This could be degenerative or due to osteomyelitis/septic arthritis. MRI of the ankle with  contrast may be helpful for further evaluation. Electronically Signed   By: Minerva Fester M.D.   On: 10/05/2023 03:23      Labs: BNP (last 3  results) No results for input(s): "BNP" in the last 8760 hours. Basic Metabolic Panel: Recent Labs  Lab 10/10/23 0418 10/11/23 0522 10/12/23 0531 10/13/23 0524 10/14/23 0543  NA 140 138 140 139 142  K 3.8 3.9 4.2 4.2 4.1  CL 107 107 111 110 109  CO2 26 25 25 24 26   GLUCOSE 99 147* 157* 173* 143*  BUN 15 18 22* 20 17  CREATININE 1.14* 1.32* 1.53* 1.49* 1.17*  CALCIUM 8.2* 8.1* 8.3* 8.2* 8.2*  MG 2.2  --  2.4  --  2.2   Liver Function Tests: No results for input(s): "AST", "ALT", "ALKPHOS", "BILITOT", "PROT", "ALBUMIN" in the last 168 hours. No results for input(s): "LIPASE", "AMYLASE" in the last 168 hours. No results for input(s): "AMMONIA" in the last 168 hours. CBC: Recent Labs  Lab 10/10/23 0418 10/11/23 0522 10/12/23 0531 10/13/23 0524 10/14/23 0543  WBC 8.5 15.7* 12.0* 11.7* 10.2  HGB 8.2* 7.8* 7.8* 6.9* 9.1*  HCT 25.4* 24.3* 25.2* 22.0* 27.9*  MCV 90.4 92.4 95.8 94.8 90.3  PLT 258 269 272 264 245   Cardiac Enzymes: No results for input(s): "CKTOTAL", "CKMB", "CKMBINDEX", "TROPONINI" in the last 168 hours. BNP: Invalid input(s): "POCBNP" CBG: Recent Labs  Lab 10/13/23 0839 10/13/23 1235 10/13/23 1753 10/13/23 2022 10/14/23 0816  GLUCAP 146* 136* 162* 146* 113*   D-Dimer No results for input(s): "DDIMER" in the last 72 hours. Hgb A1c No results for input(s): "HGBA1C" in the last 72 hours. Lipid Profile No results for input(s): "CHOL", "HDL", "LDLCALC", "TRIG", "CHOLHDL", "LDLDIRECT" in the last 72 hours. Thyroid function studies No results for input(s): "TSH", "T4TOTAL", "T3FREE", "THYROIDAB" in the last 72 hours.  Invalid input(s): "FREET3" Anemia work up No results for input(s): "VITAMINB12", "FOLATE", "FERRITIN", "TIBC", "IRON", "RETICCTPCT" in the last 72 hours. Urinalysis    Component Value  Date/Time   COLORURINE AMBER (A) 11/03/2020 1424   APPEARANCEUR CLOUDY (A) 11/03/2020 1424   APPEARANCEUR Clear 11/02/2014 1505   LABSPEC 1.029 11/03/2020 1424   LABSPEC 1.027 11/02/2014 1505   PHURINE 5.0 11/03/2020 1424   GLUCOSEU >=500 (A) 11/03/2020 1424   GLUCOSEU >=500 11/02/2014 1505   HGBUR NEGATIVE 11/03/2020 1424   BILIRUBINUR NEGATIVE 11/03/2020 1424   BILIRUBINUR neg 12/24/2019 1413   BILIRUBINUR Negative 11/02/2014 1505   KETONESUR NEGATIVE 11/03/2020 1424   PROTEINUR 30 (A) 11/03/2020 1424   UROBILINOGEN 0.2 12/24/2019 1413   NITRITE NEGATIVE 11/03/2020 1424   LEUKOCYTESUR NEGATIVE 11/03/2020 1424   LEUKOCYTESUR Negative 11/02/2014 1505   Sepsis Labs Recent Labs  Lab 10/11/23 0522 10/12/23 0531 10/13/23 0524 10/14/23 0543  WBC 15.7* 12.0* 11.7* 10.2   Microbiology Recent Results (from the past 240 hours)  Aerobic/Anaerobic Culture w Gram Stain (surgical/deep wound)     Status: None   Collection Time: 10/05/23  2:58 AM   Specimen: Heel  Result Value Ref Range Status   Specimen Description   Final    HEEL left foot Performed at [facility], [address], [place], [place] [address]    Special Requests   Final    NONE Performed at [facility], [address] [place], [place] [address]    Gram Stain   Final    FEW WBC PRESENT, PREDOMINANTLY PMN NO ORGANISMS SEEN    Culture   Final    RARE NORMAL SKIN FLORA NO ANAEROBES ISOLATED Performed at [facility], [address], [place], [place] [address]    Report Status 10/10/2023 FINAL  Final  Aerobic/Anaerobic Culture w Gram Stain (surgical/deep wound)     Status: None   Collection Time: 10/05/23  4:55 PM   Specimen: Path Tissue  Result Value Ref Range Status   Specimen Description   Final    TISSUE Performed at [facility], [address] [place], [place] [address]    Special Requests LEFT INTRAMEDULLARY CULTURE OF TIBIA  Final   Gram Stain   Final    FEW  WBC PRESENT,BOTH PMN AND MONONUCLEAR NO ORGANISMS SEEN    Culture   Final    No growth aerobically or anaerobically. Performed at [facility], [address], [place], [place] [address]    Report Status 10/10/2023 FINAL  Final     Total time spend on discharging this patient, including the last patient exam, discussing the hospital stay, instructions for ongoing care as it relates to all pertinent caregivers, as well as preparing the medical discharge records, prescriptions, and/or referrals as applicable, is 35 minutes.    Darlin Priestly, MD  [facility] 10/14/2023, 8:58 AM

## 2023-10-14 NOTE — TOC Transition Note (Signed)
 Transition of Care [facility]) - Discharge Note   Patient Details  Name: Donna Fisher MRN: [id number] Date of Birth: 09/26/1965  Transition of [facility]) CM/SW Contact:  [name], LCSW Phone Number: 10/14/2023, 10:38 AM   Clinical Narrative:  Patient has orders to discharge home today. [facility] liaison is aware. No further concerns. CSW signing off.   Final next level of care: Home w Home Health Services Barriers to Discharge: Barriers Resolved   Patient Goals and CMS Choice            Discharge Placement                Patient to be transferred to facility by: Husband   Patient and family notified of of transfer: 10/14/23  Discharge Plan and Services Additional resources added to the After Visit Summary for     Discharge Planning Services: CM Consult                      HH Arranged: RN, PT, OT [facility] Agency: [facility] Date [facility] Agency Contacted: 10/14/23 Time HH Agency Contacted: 1517 Representative spoke with at [facility] Agency: [place]  Social Drivers of Health (SDOH) Interventions SDOH Screenings   Food Insecurity: No Food Insecurity (10/05/2023)  Housing: Low Risk  (10/06/2023)  Transportation Needs: No Transportation Needs (10/05/2023)  Utilities: Not At Risk (10/05/2023)  Depression (PHQ2-9): Low Risk  (11/14/2020)  Financial Resource Strain: Low Risk  (08/11/2023)   Received from [facility]  Social Connections: Unknown (05/28/2019)  Stress: No Stress Concern Present (05/28/2019)  Tobacco Use: High Risk (10/04/2023)     Readmission Risk Interventions     No data to display

## 2023-10-14 NOTE — Discharge Instructions (Addendum)
 Dressing Changes For Amputation:  DAILY DRESSING CHANGE  Remove old Dressing  Cover staples with Xeroform guaze then  Cover with 4X4's  then  Cover with ABD Pads X2 then  Wrap with Kerliex then  Wrap Snugly with 6 Inch Ace Bandage

## 2023-10-14 NOTE — Plan of Care (Signed)

## 2023-10-14 NOTE — Progress Notes (Signed)
 Progress Note    10/14/2023 9:23 AM 4 Days Post-Op  Subjective:  Donna Fisher is a 58 yo female now POD #3 from left Above the Knee Amputation. Patient is recovering as expected. She endorses some pain this morning but states it is tolerable.  No complaints over night and vitals all remain stable.    Vitals:   10/14/23 0308 10/14/23 0815  BP: 123/70 126/69  Pulse: 88 86  Resp: 16 17  Temp: 98 F (36.7 C) 98.1 F (36.7 C)  SpO2: 98% 100%   Physical Exam: Cardiac:  RRR, Normal S1, S2. No murmurs noted.  Lungs:  Clear on auscultation throughout. No rales, rhonchi and wheezing.  Incisions:  Left lower extremity Above the Knee Amputation. Dressing clean dry and intact. No drainage noted.  Extremities:  Left lower extremity with Open Amputation. Warm to touch. Right lower extremity with palpable DP/PT pulses. Warm to touch.  Abdomen:  Positive bowel sounds throughout, soft non tender and non distended.  Neurologic: AAOX3, Answers questions and follows commands.  CBC    Component Value Date/Time   WBC 10.2 10/14/2023 0543   RBC 3.09 (L) 10/14/2023 0543   HGB 9.1 (L) 10/14/2023 0543   HGB 12.2 05/10/2017 1001   HCT 27.9 (L) 10/14/2023 0543   HCT 35.5 05/10/2017 1001   PLT 245 10/14/2023 0543   PLT 267 05/10/2017 1001   MCV 90.3 10/14/2023 0543   MCV 88 05/10/2017 1001   MCV 90 11/03/2014 0521   MCH 29.4 10/14/2023 0543   MCHC 32.6 10/14/2023 0543   RDW 17.5 (H) 10/14/2023 0543   RDW 14.9 05/10/2017 1001   RDW 12.3 11/03/2014 0521   LYMPHSABS 0.6 (L) 11/06/2020 0444   LYMPHSABS 3.3 11/03/2014 0521   MONOABS 0.3 11/06/2020 0444   MONOABS 0.7 11/03/2014 0521   EOSABS 0.0 11/06/2020 0444   EOSABS 0.4 11/03/2014 0521   BASOSABS 0.0 11/06/2020 0444   BASOSABS 0.1 11/03/2014 0521    BMET    Component Value Date/Time   NA 142 10/14/2023 0543   NA 136 11/03/2014 0521   K 4.1 10/14/2023 0543   K 3.9 11/03/2014 0521   CL 109 10/14/2023 0543   CL 105 11/03/2014 0521    CO2 26 10/14/2023 0543   CO2 29 11/03/2014 0521   GLUCOSE 143 (H) 10/14/2023 0543   GLUCOSE 281 (H) 11/03/2014 0521   BUN 17 10/14/2023 0543   BUN 18 11/03/2014 0521   CREATININE 1.17 (H) 10/14/2023 0543   CREATININE 0.87 11/03/2014 0521   CALCIUM 8.2 (L) 10/14/2023 0543   CALCIUM 8.0 (L) 11/03/2014 0521   GFRNONAA 54 (L) 10/14/2023 0543   GFRNONAA >60 11/03/2014 0521   GFRAA 39 (L) 05/29/2019 0831   GFRAA >60 11/03/2014 0521    INR    Component Value Date/Time   INR 1.1 11/03/2020 1406     Intake/Output Summary (Last 24 hours) at 10/14/2023 9485 Last data filed at 10/13/2023 1900 Gross per 24 hour  Intake 743.5 ml  Output --  Net 743.5 ml     Assessment/Plan:  58 y.o. female is s/p Left Above the knee amputation.  4 Days Post-Op   PLAN Dressing change completed again this morning by vascular the bedside.  Patient's husband was taught how to do the dressing changes at home.  Okay for patient to discharge home today.  DVT prophylaxis:   Lovenox 40 mg Sq Q24    Antuan Limes R Floella Ensz [facility] 10/14/2023 9:23 AM

## 2023-10-14 NOTE — Progress Notes (Signed)
 Physical Therapy Treatment Patient Details Name: Donna Fisher MRN: [id number] DOB: 1966-05-13 Today's Date: 10/14/2023   History of Present Illness Donna Fisher is a 57yoF comes to [facility] ED via Dr. Ether Griffins of podiatry after xray showed infection and gas in the left foot. PMH: DM2, diabetic neuropathy, CKD 3,  Left ankle ORIF/fusion. Imaging revealing of evidence of chronic osteomyelitis involving the calcaneus and most of the tibial shaft. 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    PT Comments  Pt eating breakfast, husband at bedside. Pt reports successful pivot toilet transfers yesterday, ~5x, still moving well in general. Pain control has been at goal. Pt not seen by our services yesterday due to getting blood. Review DC mobility concerns with pt and husband present. Educated pt and spouse on 2 techniques for residual limb wrapping. Provided a handout and video link for these. Pt aware that vascular surgery will be coming later to formally wrap limb for DC. All other DC questions address from PT standpoint. Pt denies any need to work on AMB prior to leaving.    If plan is discharge home, recommend the following: A little help with walking and/or transfers;A little help with bathing/dressing/bathroom;Assistance with cooking/housework;Assist for transportation;Help with stairs or ramp for entrance   Can travel by private vehicle        Equipment Recommendations  Rolling walker (2 wheels)    Recommendations for Other Services       Precautions / Restrictions Precautions Precautions: Fall Recall of Precautions/Restrictions: Intact Precaution/Restrictions Comments: L AKA Restrictions LLE Weight Bearing Per Provider Order: Non weight bearing     Mobility  Bed Mobility                    Transfers                         Ambulation/Gait                   Stairs             Wheelchair Mobility     Tilt Bed    Modified Rankin (Stroke Patients Only)       Balance                                            Communication    Cognition Arousal: Alert Behavior During Therapy: WFL for tasks assessed/performed   PT - Cognitive impairments: No apparent impairments                                Cueing    Exercises Other Exercises Other Exercises: reviewed car transfer information Other Exercises: education on limb wrapping: paper handout and video link sent    General Comments        Pertinent Vitals/Pain Pain Assessment Pain Assessment: Faces Faces Pain Scale: Hurts little more    Home Living                          Prior Function            PT Goals (current goals can  now be found in the care plan section) Acute Rehab PT Goals Patient Stated Goal: return to home at DC if possible PT Goal Formulation: With patient Time For Goal Achievement: 10/25/23 Potential to Achieve Goals: Good Progress towards PT goals: Progressing toward goals    Frequency    Min 3X/week      PT Plan      Co-evaluation              AM-PAC PT "6 Clicks" Mobility   Outcome Measure  Help needed turning from your back to your side while in a flat bed without using bedrails?: A Little Help needed moving from lying on your back to sitting on the side of a flat bed without using bedrails?: A Little Help needed moving to and from a bed to a chair (including a wheelchair)?: A Little Help needed standing up from a chair using your arms (e.g., wheelchair or bedside chair)?: A Little Help needed to walk in hospital room?: A Lot Help needed climbing 3-5 steps with a railing? : A Lot 6 Click Score: 16    End of Session   Activity Tolerance: Patient tolerated treatment well;No increased pain Patient left: in bed;with family/visitor  present;with call bell/phone within reach Nurse Communication: Mobility status PT Visit Diagnosis: Other abnormalities of gait and mobility (R26.89);Difficulty in walking, not elsewhere classified (R26.2);Muscle weakness (generalized) (M62.81)     Time: 8119-1478 PT Time Calculation (min) (ACUTE ONLY): 34 min  Charges:    $Therapeutic Activity: 23-37 mins PT General Charges $$ ACUTE PT VISIT: 1 Visit                    11:49 AM, 10/14/23 Rosamaria Lints, PT, DPT Physical Therapist - [facility]  [phone] (ASCOM)    Marquiz Sotelo C 10/14/2023, 11:40 AM

## 2023-10-19 ENCOUNTER — Ambulatory Visit: Admitting: Obstetrics and Gynecology

## 2023-11-01 ENCOUNTER — Ambulatory Visit (INDEPENDENT_AMBULATORY_CARE_PROVIDER_SITE_OTHER): Admitting: Vascular Surgery

## 2023-11-01 ENCOUNTER — Encounter (INDEPENDENT_AMBULATORY_CARE_PROVIDER_SITE_OTHER): Payer: Self-pay | Admitting: Vascular Surgery

## 2023-11-01 VITALS — BP 96/63 | HR 99 | Resp 16

## 2023-11-01 DIAGNOSIS — M869 Osteomyelitis, unspecified: Secondary | ICD-10-CM

## 2023-11-01 DIAGNOSIS — E118 Type 2 diabetes mellitus with unspecified complications: Secondary | ICD-10-CM

## 2023-11-01 NOTE — Assessment & Plan Note (Signed)
 With significant hardware infection.  Underwent left above-knee amputation about 3 weeks ago.  Healing well.  Half the staples were removed today.  We can go ahead and get her referral to [place] [facility].  Return in 1 to 2 weeks to remove the remainder of staples.

## 2023-11-01 NOTE — Assessment & Plan Note (Signed)
 blood glucose control important in reducing the progression of atherosclerotic disease. Also, involved in wound healing. On appropriate medications.

## 2023-11-01 NOTE — Progress Notes (Signed)
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     Status: Abnormal   Collection Time: 10/08/23  7:30 AM  Result Value Ref Range   Glucose-Capillary 100 (H) 70 - 99 mg/dL    Comment: Glucose reference range applies only to samples taken after fasting for at least 8 hours.  Basic metabolic panel     Status: Abnormal    Collection Time: 10/08/23 10:01 AM  Result Value Ref Range   Sodium 140 135 - 145 mmol/L    Comment: ELECTROLYTES REPEATED TO VERIFY.PMF   Potassium 5.2 (H) 3.5 - 5.1 mmol/L   Chloride 112 (H) 98 - 111 mmol/L   CO2 26 22 - 32 mmol/L   Glucose, Bld 144 (H) 70 - 99 mg/dL    Comment: Glucose reference range applies only to samples taken after fasting for at least 8 hours.   BUN 20 6 - 20 mg/dL   Creatinine, Ser 2.53 (H) 0.44 - 1.00 mg/dL   Calcium 8.5 (L) 8.9 - 10.3 mg/dL   GFR, Estimated 46 (L) >60 mL/min    Comment: (NOTE) Calculated using the CKD-EPI Creatinine Equation (2021)    Anion gap 2 (L) 5 - 15    Comment: Performed at [facility], [address] [place], [place] [address]  Glucose, capillary     Status: Abnormal   Collection Time: 10/08/23 11:27 AM  Result Value Ref Range   Glucose-Capillary 135 (H) 70 - 99 mg/dL    Comment: Glucose reference range applies only to samples taken after fasting for at least 8 hours.  Glucose, capillary     Status: Abnormal   Collection Time: 10/08/23  5:41 PM  Result Value Ref Range   Glucose-Capillary 156 (H) 70 - 99 mg/dL    Comment: Glucose reference range applies only to samples taken after fasting for at least 8 hours.  Glucose, capillary     Status: Abnormal   Collection Time: 10/08/23  8:34 PM  Result Value Ref Range   Glucose-Capillary 137 (H) 70 - 99 mg/dL    Comment: Glucose reference range applies only to samples taken after fasting for at least 8 hours.  Creatinine, serum     Status: Abnormal   Collection Time: 10/09/23  4:30 AM  Result Value Ref Range   Creatinine, Ser 1.26 (H) 0.44 - 1.00 mg/dL   GFR, Estimated 50 (L) >60 mL/min    Comment: (NOTE) Calculated using the CKD-EPI Creatinine Equation (2021) Performed at [facility], [address] [place], [place] [address]   Glucose, capillary     Status: Abnormal   Collection Time: 10/09/23  9:15 AM  Result Value Ref Range   Glucose-Capillary 169  (H) 70 - 99 mg/dL    Comment: Glucose reference range applies only to samples taken after fasting for at least 8 hours.  Glucose, capillary     Status: Abnormal   Collection Time: 10/09/23 11:26 AM  Result Value Ref Range   Glucose-Capillary 117 (H) 70 - 99 mg/dL    Comment:  Glucose reference range applies only to samples taken after fasting for at least 8 hours.  Glucose, capillary     Status: Abnormal   Collection Time: 10/09/23  4:20 PM  Result Value Ref Range   Glucose-Capillary 154 (H) 70 - 99 mg/dL    Comment: Glucose reference range applies only to samples taken after fasting for at least 8 hours.  Glucose, capillary     Status: Abnormal   Collection Time: 10/09/23 10:01 PM  Result Value Ref Range   Glucose-Capillary 156 (H) 70 - 99 mg/dL    Comment: Glucose reference range applies only to samples taken after fasting for at least 8 hours.  Surgical pathology     Status: None   Collection Time: 10/10/23 12:00 AM  Result Value Ref Range   SURGICAL PATHOLOGY      SURGICAL PATHOLOGY [facility] [address], [address] [place], [place] [address] Telephone [phone] or [phone] Fax [phone]  REPORT OF SURGICAL PATHOLOGY   Accession #: [id number] Patient Name: Donna Fisher, Donna Fisher Visit # : [id number]  MRN: [id number] Physician: Kevan Peers 08/24/1965 (Age: 45) Gender: F Collected Date: 10/10/2023 Received Date: 10/10/2023  FINAL DIAGNOSIS       1. Leg, amputation, left above the knee amputation :       - DISTAL STUMP WITH NECROSIS AND ACUTE OSTEOMYELITIS.      - PROXIMAL MARGIN WITH VIABLE SOFT TISSUE AND BONE; NEGATIVE FOR OSTEOMYELITIS.      - ATHEROSCLEROSIS WITH CALCIFICATION.       DATE SIGNED OUT: 10/12/2023 ELECTRONIC SIGNATURE : Brunetta Capes Md, Alexandria Ida , Pathologist, Electronic Signature  MICROSCOPIC DESCRIPTION  CASE COMMENTS STAINS USED IN DIAGNOSIS: H&E H&E H&E H&E H&E H&E    CLINICAL HISTORY  SPECIMEN(S)  OBTAINED 1. Leg, amputation, Left Above The Knee Amputa tion  SPECIMEN COMMENTS: SPECIMEN CLINICAL INFORMATION: 1. Osteomyelitis    Gross Description 1. "Left above knee amputation", received fresh is a left leg above the knee amputation with a smooth cut surface and exposed soft tissue proximally; no foot is present. The specimen is 30 cm from previous resection margin to knee, and 8 cm from knee to new resection margin, with 7 cm of femur extending past the soft tissue margin.      The soft tissue at the distal margin is softened and pale, and does not appear      grossly viable. The surrounding skin is hyperpigmented. Additional discrete skin      lesions or not present. The new bony and soft tissue margins appear grossly      viable. The vasculature is mild to moderately atherosclerotic. Block summary:      1A: Bone marrow from distal (previous) resection margin, following      decalcification      1B: Bone marrow from new resection margin, following decalcification      1C: Soft tissue from distal (previous) margin , perpendicular and inked blue      1D: Femoral artery margin, en face      1E: Posterior tibial artery cross-section      53F: Anterior tibial artery cross-section      SMB      10/10/23        Report signed out from the following location(s) [place] [address], [place] [address] CLIA #: [id number]  [facility] [address] [place], [place] [address] CLIA #: [id number]   Basic metabolic panel  Status: Abnormal   Collection Time: 10/10/23  4:18 AM  Result Value Ref Range   Sodium 140 135 - 145 mmol/L   Potassium 3.8 3.5 - 5.1 mmol/L   Chloride 107 98 - 111 mmol/L   CO2 26 22 - 32 mmol/L   Glucose, Bld 99 70 - 99 mg/dL    Comment: Glucose reference range applies only to samples taken after fasting for at least 8 hours.   BUN 15 6 - 20 mg/dL   Creatinine, Ser 1.61 (H) 0.44 - 1.00 mg/dL   Calcium  8.2 (L) 8.9 - 10.3 mg/dL   GFR, Estimated 56 (L) >60 mL/min    Comment: (NOTE) Calculated using the CKD-EPI Creatinine Equation (2021)    Anion gap 7 5 - 15    Comment: Performed at [facility], [address] [place], [place] [address]  CBC     Status: Abnormal   Collection Time: 10/10/23  4:18 AM  Result Value Ref Range   WBC 8.5 4.0 - 10.5 K/uL   RBC 2.81 (L) 3.87 - 5.11 MIL/uL   Hemoglobin 8.2 (L) 12.0 - 15.0 g/dL   HCT 54.0 (L) 98.1 - 19.1 %   MCV 90.4 80.0 - 100.0 fL   MCH 29.2 26.0 - 34.0 pg   MCHC 32.3 30.0 - 36.0 g/dL   RDW 47.8 29.5 - 62.1 %   Platelets 258 150 - 400 K/uL   nRBC 0.0 0.0 - 0.2 %    Comment: Performed at [facility], [address] [place], [place] [address]  Magnesium     Status: None   Collection Time: 10/10/23  4:18 AM  Result Value Ref Range   Magnesium 2.2 1.7 - 2.4 mg/dL    Comment: Performed at [facility], [address] [place], [place] [address]  Type and screen     Status: None   Collection Time: 10/10/23  4:18 AM  Result Value Ref Range   ABO/RH(D) A POS    Antibody Screen NEG    Sample Expiration 10/13/2023,2359    Unit Number [id number]    Blood Component Type RED CELLS,LR    Unit division 00    Status of Unit ISSUED,FINAL    Transfusion Status OK TO TRANSFUSE    Crossmatch Result      Compatible Performed at [facility], [address] [place], [place] [address]   BPAM RBC     Status: None   Collection Time: 10/10/23  4:18 AM  Result Value Ref Range   ISSUE DATE / TIME [id number]    Blood Product Unit Number [id number]    PRODUCT CODE [id number]    Unit Type and Rh 6200    Blood Product Expiration Date [id number]   Glucose, capillary     Status: Abnormal   Collection Time: 10/10/23  8:36 AM  Result Value Ref Range   Glucose-Capillary 130 (H) 70 - 99 mg/dL    Comment: Glucose reference range applies only to samples taken after fasting for at least 8 hours.  Glucose,  capillary     Status: Abnormal   Collection Time: 10/10/23  9:54 AM  Result Value Ref Range   Glucose-Capillary 129 (H) 70 - 99 mg/dL    Comment: Glucose reference range applies only to samples taken after fasting for at least 8 hours.  Glucose, capillary     Status: Abnormal   Collection Time: 10/10/23 12:22 PM  Result Value Ref Range   Glucose-Capillary 129 (H) 70 -  99 mg/dL    Comment: Glucose reference range applies only to samples taken after fasting for at least 8 hours.  Glucose, capillary     Status: Abnormal   Collection Time: 10/10/23  4:32 PM  Result Value Ref Range   Glucose-Capillary 140 (H) 70 - 99 mg/dL    Comment: Glucose reference range applies only to samples taken after fasting for at least 8 hours.  Glucose, capillary     Status: Abnormal   Collection Time: 10/10/23  9:16 PM  Result Value Ref Range   Glucose-Capillary 106 (H) 70 - 99 mg/dL    Comment: Glucose reference range applies only to samples taken after fasting for at least 8 hours.  CBC     Status: Abnormal   Collection Time: 10/11/23  5:22 AM  Result Value Ref Range   WBC 15.7 (H) 4.0 - 10.5 K/uL   RBC 2.63 (L) 3.87 - 5.11 MIL/uL   Hemoglobin 7.8 (L) 12.0 - 15.0 g/dL   HCT 16.1 (L) 09.6 - 04.5 %   MCV 92.4 80.0 - 100.0 fL   MCH 29.7 26.0 - 34.0 pg   MCHC 32.1 30.0 - 36.0 g/dL   RDW 40.9 81.1 - 91.4 %   Platelets 269 150 - 400 K/uL   nRBC 0.0 0.0 - 0.2 %    Comment: Performed at [facility], [address], [place], [place] [address]  Basic metabolic panel     Status: Abnormal   Collection Time: 10/11/23  5:22 AM  Result Value Ref Range   Sodium 138 135 - 145 mmol/L   Potassium 3.9 3.5 - 5.1 mmol/L   Chloride 107 98 - 111 mmol/L   CO2 25 22 - 32 mmol/L   Glucose, Bld 147 (H) 70 - 99 mg/dL    Comment: Glucose reference range applies only to samples taken after fasting for at least 8 hours.   BUN 18 6 - 20 mg/dL   Creatinine, Ser 6.21 (H) 0.44 - 1.00 mg/dL   Calcium 8.1 (L) 8.9 - 10.3  mg/dL   GFR, Estimated 47 (L) >60 mL/min    Comment: (NOTE) Calculated using the CKD-EPI Creatinine Equation (2021)    Anion gap 6 5 - 15    Comment: Performed at [facility], [address] [place], [place] [address]  Glucose, capillary     Status: Abnormal   Collection Time: 10/11/23  7:53 AM  Result Value Ref Range   Glucose-Capillary 120 (H) 70 - 99 mg/dL    Comment: Glucose reference range applies only to samples taken after fasting for at least 8 hours.   Comment 1 Notify RN    Comment 2 Document in Chart   Glucose, capillary     Status: Abnormal   Collection Time: 10/11/23 11:37 AM  Result Value Ref Range   Glucose-Capillary 164 (H) 70 - 99 mg/dL    Comment: Glucose reference range applies only to samples taken after fasting for at least 8 hours.   Comment 1 Notify RN    Comment 2 Document in Chart   Glucose, capillary     Status: Abnormal   Collection Time: 10/11/23  4:37 PM  Result Value Ref Range   Glucose-Capillary 140 (H) 70 - 99 mg/dL    Comment: Glucose reference range applies only to samples taken after fasting for at least 8 hours.   Comment 1 Notify RN    Comment 2 Document in Chart   Glucose, capillary  Status: Abnormal   Collection Time: 10/11/23  9:40 PM  Result Value Ref Range   Glucose-Capillary 133 (H) 70 - 99 mg/dL    Comment: Glucose reference range applies only to samples taken after fasting for at least 8 hours.  Glucose, capillary     Status: Abnormal   Collection Time: 10/12/23 12:33 AM  Result Value Ref Range   Glucose-Capillary 167 (H) 70 - 99 mg/dL    Comment: Glucose reference range applies only to samples taken after fasting for at least 8 hours.  Basic metabolic panel     Status: Abnormal   Collection Time: 10/12/23  5:31 AM  Result Value Ref Range   Sodium 140 135 - 145 mmol/L   Potassium 4.2 3.5 - 5.1 mmol/L   Chloride 111 98 - 111 mmol/L   CO2 25 22 - 32 mmol/L   Glucose, Bld 157 (H) 70 - 99 mg/dL    Comment: Glucose  reference range applies only to samples taken after fasting for at least 8 hours.   BUN 22 (H) 6 - 20 mg/dL   Creatinine, Ser 8.29 (H) 0.44 - 1.00 mg/dL   Calcium 8.3 (L) 8.9 - 10.3 mg/dL   GFR, Estimated 39 (L) >60 mL/min    Comment: (NOTE) Calculated using the CKD-EPI Creatinine Equation (2021)    Anion gap 4 (L) 5 - 15    Comment: Performed at [facility], [address] [place], [place] [address]  CBC     Status: Abnormal   Collection Time: 10/12/23  5:31 AM  Result Value Ref Range   WBC 12.0 (H) 4.0 - 10.5 K/uL   RBC 2.63 (L) 3.87 - 5.11 MIL/uL   Hemoglobin 7.8 (L) 12.0 - 15.0 g/dL   HCT 08.6 (L) 57.8 - 46.9 %   MCV 95.8 80.0 - 100.0 fL   MCH 29.7 26.0 - 34.0 pg   MCHC 31.0 30.0 - 36.0 g/dL   RDW 62.9 52.8 - 41.3 %   Platelets 272 150 - 400 K/uL   nRBC 0.3 (H) 0.0 - 0.2 %    Comment: Performed at [facility], [address] [place], [place] [address]  Magnesium     Status: None   Collection Time: 10/12/23  5:31 AM  Result Value Ref Range   Magnesium 2.4 1.7 - 2.4 mg/dL    Comment: Performed at [facility], [address] [place], [place] [address]  Glucose, capillary     Status: Abnormal   Collection Time: 10/12/23  8:56 AM  Result Value Ref Range   Glucose-Capillary 105 (H) 70 - 99 mg/dL    Comment: Glucose reference range applies only to samples taken after fasting for at least 8 hours.  Glucose, capillary     Status: Abnormal   Collection Time: 10/12/23 12:05 PM  Result Value Ref Range   Glucose-Capillary 128 (H) 70 - 99 mg/dL    Comment: Glucose reference range applies only to samples taken after fasting for at least 8 hours.  Glucose, capillary     Status: Abnormal   Collection Time: 10/12/23  3:24 PM  Result Value Ref Range   Glucose-Capillary 113 (H) 70 - 99 mg/dL    Comment: Glucose reference range applies only to samples taken after fasting for at least 8 hours.  Glucose, capillary     Status: Abnormal   Collection Time:  10/12/23  9:47 PM  Result Value Ref Range   Glucose-Capillary 129 (H) 70 - 99 mg/dL  Comment: Glucose reference range applies only to samples taken after fasting for at least 8 hours.  CBC     Status: Abnormal   Collection Time: 10/13/23  5:24 AM  Result Value Ref Range   WBC 11.7 (H) 4.0 - 10.5 K/uL   RBC 2.32 (L) 3.87 - 5.11 MIL/uL   Hemoglobin 6.9 (L) 12.0 - 15.0 g/dL   HCT 16.1 (L) 09.6 - 04.5 %   MCV 94.8 80.0 - 100.0 fL   MCH 29.7 26.0 - 34.0 pg   MCHC 31.4 30.0 - 36.0 g/dL   RDW 40.9 81.1 - 91.4 %   Platelets 264 150 - 400 K/uL   nRBC 0.0 0.0 - 0.2 %    Comment: Performed at [facility], [address] [place], [place] [address]  Basic metabolic panel     Status: Abnormal   Collection Time: 10/13/23  5:24 AM  Result Value Ref Range   Sodium 139 135 - 145 mmol/L   Potassium 4.2 3.5 - 5.1 mmol/L   Chloride 110 98 - 111 mmol/L   CO2 24 22 - 32 mmol/L   Glucose, Bld 173 (H) 70 - 99 mg/dL    Comment: Glucose reference range applies only to samples taken after fasting for at least 8 hours.   BUN 20 6 - 20 mg/dL   Creatinine, Ser 6.21 (H) 0.44 - 1.00 mg/dL   Calcium 8.2 (L) 8.9 - 10.3 mg/dL   GFR, Estimated 41 (L) >60 mL/min    Comment: (NOTE) Calculated using the CKD-EPI Creatinine Equation (2021)    Anion gap 5 5 - 15    Comment: Performed at [facility], [address] [place], [place] [address]  Glucose, capillary     Status: Abnormal   Collection Time: 10/13/23  8:39 AM  Result Value Ref Range   Glucose-Capillary 146 (H) 70 - 99 mg/dL    Comment: Glucose reference range applies only to samples taken after fasting for at least 8 hours.  Prepare RBC (crossmatch)     Status: None   Collection Time: 10/13/23 10:46 AM  Result Value Ref Range   Order Confirmation      ORDER PROCESSED BY BLOOD BANK Performed at [facility], [address] [place], [place] [address]   Type and screen [facility]     Status: None    Collection Time: 10/13/23 11:12 AM  Result Value Ref Range   ABO/RH(D) A POS    Antibody Screen NEG    Sample Expiration      10/16/2023,2359 Performed at [facility], [address] [place], [place] [address]   Glucose, capillary     Status: Abnormal   Collection Time: 10/13/23 12:35 PM  Result Value Ref Range   Glucose-Capillary 136 (H) 70 - 99 mg/dL    Comment: Glucose reference range applies only to samples taken after fasting for at least 8 hours.  Glucose, capillary     Status: Abnormal   Collection Time: 10/13/23  5:53 PM  Result Value Ref Range   Glucose-Capillary 162 (H) 70 - 99 mg/dL    Comment: Glucose reference range applies only to samples taken after fasting for at least 8 hours.  Glucose, capillary     Status: Abnormal   Collection Time: 10/13/23  8:22 PM  Result Value Ref Range   Glucose-Capillary 146 (H) 70 - 99 mg/dL    Comment: Glucose reference range applies only to samples taken after fasting for at  least 8 hours.  Basic metabolic panel     Status: Abnormal   Collection Time: 10/14/23  5:43 AM  Result Value Ref Range   Sodium 142 135 - 145 mmol/L   Potassium 4.1 3.5 - 5.1 mmol/L   Chloride 109 98 - 111 mmol/L   CO2 26 22 - 32 mmol/L   Glucose, Bld 143 (H) 70 - 99 mg/dL    Comment: Glucose reference range applies only to samples taken after fasting for at least 8 hours.   BUN 17 6 - 20 mg/dL   Creatinine, Ser 1.61 (H) 0.44 - 1.00 mg/dL   Calcium 8.2 (L) 8.9 - 10.3 mg/dL   GFR, Estimated 54 (L) >60 mL/min    Comment: (NOTE) Calculated using the CKD-EPI Creatinine Equation (2021)    Anion gap 7 5 - 15    Comment: Performed at [facility], [address] [place], [place] [address]  CBC     Status: Abnormal   Collection Time: 10/14/23  5:43 AM  Result Value Ref Range   WBC 10.2 4.0 - 10.5 K/uL   RBC 3.09 (L) 3.87 - 5.11 MIL/uL   Hemoglobin 9.1 (L) 12.0 - 15.0 g/dL    Comment: REPEATED TO VERIFY   HCT 27.9 (L) 36.0 - 46.0 %    MCV 90.3 80.0 - 100.0 fL   MCH 29.4 26.0 - 34.0 pg   MCHC 32.6 30.0 - 36.0 g/dL   RDW 54.0 (H) 98.1 - 19.1 %   Platelets 245 150 - 400 K/uL   nRBC 0.0 0.0 - 0.2 %    Comment: Performed at [facility], [address], [place], [place] [address]  Magnesium     Status: None   Collection Time: 10/14/23  5:43 AM  Result Value Ref Range   Magnesium 2.2 1.7 - 2.4 mg/dL    Comment: Performed at [facility], [address] [place], [place] [address]  Glucose, capillary     Status: Abnormal   Collection Time: 10/14/23  8:16 AM  Result Value Ref Range   Glucose-Capillary 113 (H) 70 - 99 mg/dL    Comment: Glucose reference range applies only to samples taken after fasting for at least 8 hours.    Radiology MR TIBIA FIBULA LEFT W WO CONTRAST Result Date: 10/06/2023 CLINICAL DATA:  Soft tissue infection suspected, lower leg, xray done History of poorly controlled diabetes with peripheral neuropathy and retrograde intramedullary TTC nailing of the left hindfoot and tibia for trimalleolar fracture. Patient underwent hardware removal and below the knee amputation yesterday. EXAM: MRI OF LOWER LEFT EXTREMITY WITHOUT AND WITH CONTRAST TECHNIQUE: Multiplanar, multisequence MR imaging of the left lower leg was performed both before and after administration of intravenous contrast. CONTRAST:  7mL GADAVIST GADOBUTROL 1 MMOL/ML IV SOLN COMPARISON:  Radiographs 10/05/2023.  Left ankle CT 10/14/2022. FINDINGS: Bones/Joint/Cartilage Interval removal of the tibial intramedullary nail and amputation through the distal tibia and fibula. There is low signal within the medullary cavity of the left femur at the site of the removed intramedullary nail with surrounding nonspecific bone marrow edema and enhancement. Areas of endosteal thinning anteriorly and medially, as seen on preoperative radiographs. No gross cortical destruction or periosteal fluid collections are identified. There is no evidence of  acute fracture or dislocation. The fibula appears unremarkable. Incomplete visualization of the left knee without significant findings. Ligaments Not relevant for exam/indication. Muscles and Tendons Nonspecific T2 hyperintensity throughout the left lower leg muscles without focal fluid collection, significant  atrophy or focal fluid collection. The patellar tendon appears intact. Soft tissues Postsurgical changes distally related to recent amputation with small amount of gas in the soft tissues. There is mild soft tissue enhancement posteriorly in the operative bed. No drainable fluid collection or unexpected foreign body identified. Incidental imaging of the right lower leg demonstrates no significant findings. IMPRESSION: 1. Postsurgical changes from recent interval removal of the tibial intramedullary nail and amputation through the distal tibia and fibula. 2. Low signal within the medullary cavity of the left femur at the site of the removed intramedullary nail with surrounding nonspecific bone marrow edema and enhancement. This could reflect postoperative change, although is concerning for possible chronic osteomyelitis given the radiographic findings. Correlate with surgical impression and consider short-term follow-up. 3. No gross cortical destruction or periosteal fluid collections identified. 4. Postsurgical changes in the distal soft tissues without drainable fluid collection or unexpected foreign body. 5. 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 Electronically Signed   By: Elmon Hagedorn M.D.   On: 10/06/2023 16:27   DG Tibia/Fibula Left Result Date: 10/05/2023 CLINICAL DATA:  Chronic osteomyelitis of the left tibia. EXAM: LEFT TIBIA AND FIBULA - 2 VIEW COMPARISON:  Left foot radiograph dated 10/05/2023. FINDINGS: There is no acute fracture or dislocation. The bones are osteopenic. Moderate  arthritic changes of the left knee. Old healed fracture deformities of the distal tibia and fibula. An intramedullary nail within the tibia extends into the talus and calcaneus. Areas of bone demineralization and lucency along the tibial nail may represent loosening. An infectious process is not excluded. Soft tissue gas noted over the medial ankle and posterior to the distal calf concerning for an infectious process. IMPRESSION: 1. No acute fracture or dislocation. 2. Old healed fracture deformities of the distal tibia and fibula. 3. Lucency within the tibial along the intramedullary nail concerning for infection or loosening. 4. Soft tissue gas concerning for an infectious process or necrotizing fasciitis. Electronically Signed   By: Angus Bark M.D.   On: 10/05/2023 12:34   DG Foot Complete Left Result Date: 10/05/2023 CLINICAL DATA:  Evaluation for osteomyelitis. Ulcer on the bottom of the foot for 2 weeks. History of diabetes. EXAM: LEFT FOOT - COMPLETE 3+ VIEW COMPARISON:  05/27/2019 FINDINGS: Amputation of the 1st-3rd toes at the level of the proximal phalanges. No radiographic evidence of osteomyelitis in the foot. Or if of the ankle and hindfoot. Swelling and plantar soft tissue ulcer superficial to the intramedullary rod. Locules of soft tissue gas about the ankle. Extensive degenerative change about the ankle with loss of cortical definition of the articular surface of the tibia, calcaneus, and talus. This could be degenerative however osteomyelitis/septic arthritis are not excluded. IMPRESSION: 1. Plantar soft tissue ulcer superficial to the intramedullary rod of the ankle. Locules of soft tissue gas about the ankle. Necrotizing infection is not excluded. 2. Extensive degenerative change about the ankle with loss of cortical definition of the articular surface of the tibia, calcaneus, and talus. This could be degenerative or due to osteomyelitis/septic arthritis. MRI of the ankle with contrast  may be helpful for further evaluation. Electronically Signed   By: Rozell Cornet M.D.   On: 10/05/2023 03:23    Assessment/Plan  Osteomyelitis (HCC) With significant hardware infection.  Underwent left above-knee amputation about 3 weeks ago.  Healing well.  Half the staples were removed today.  We can go ahead and get  her referral to [facility].  Return in 1 to 2 weeks to remove the remainder of staples.  Diabetes mellitus type 2 with complications (HCC) blood glucose control important in reducing the progression of atherosclerotic disease. Also, involved in wound healing. On appropriate medications.    Mikki Alexander, MD  11/01/2023 5:17 PM    This note was created with Dragon medical transcription system.  Any errors from dictation are purely unintentional

## 2023-11-08 ENCOUNTER — Ambulatory Visit (INDEPENDENT_AMBULATORY_CARE_PROVIDER_SITE_OTHER): Admitting: Vascular Surgery

## 2023-11-08 VITALS — BP 88/61 | HR 96 | Resp 16

## 2023-11-08 DIAGNOSIS — E782 Mixed hyperlipidemia: Secondary | ICD-10-CM

## 2023-11-08 DIAGNOSIS — Z89612 Acquired absence of left leg above knee: Secondary | ICD-10-CM

## 2023-11-08 DIAGNOSIS — Z9889 Other specified postprocedural states: Secondary | ICD-10-CM

## 2023-11-08 DIAGNOSIS — Z720 Tobacco use: Secondary | ICD-10-CM

## 2023-11-08 DIAGNOSIS — I739 Peripheral vascular disease, unspecified: Secondary | ICD-10-CM
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
## 2023-11-15 ENCOUNTER — Telehealth (INDEPENDENT_AMBULATORY_CARE_PROVIDER_SITE_OTHER): Payer: Self-pay

## 2023-11-15 NOTE — Telephone Encounter (Signed)
 Patient was seen in office on 11/08/23 and had staple removal from an amputation. Next office appt will be on 01/09/24. Dee Farber RN from [facility] called to say that the patient was seen and staples removed, Dee Farber states that two staples were still in and were a little deep. Dee Farber was asking if it would be okay to remove them as the patient's next appt is 2 months away. Dee Farber was advised that it would be okay for the staples to be removed.

## 2023-12-06 ENCOUNTER — Encounter (INDEPENDENT_AMBULATORY_CARE_PROVIDER_SITE_OTHER): Payer: Self-pay

## 2024-01-05 IMAGING — MG MM DIGITAL SCREENING BILAT W/ TOMO AND CAD
8 series · 8 of 24 positions shown · non-contrast
Comparison: Previous exam(s).

CLINICAL DATA: Screening.

EXAM:
DIGITAL SCREENING BILATERAL MAMMOGRAM WITH TOMOSYNTHESIS AND CAD
TECHNIQUE: Bilateral screening digital craniocaudal and mediolateral oblique
mammograms were obtained. Bilateral screening digital breast
tomosynthesis was performed. The images were evaluated with
computer-aided detection.

[R CC synth-2D]
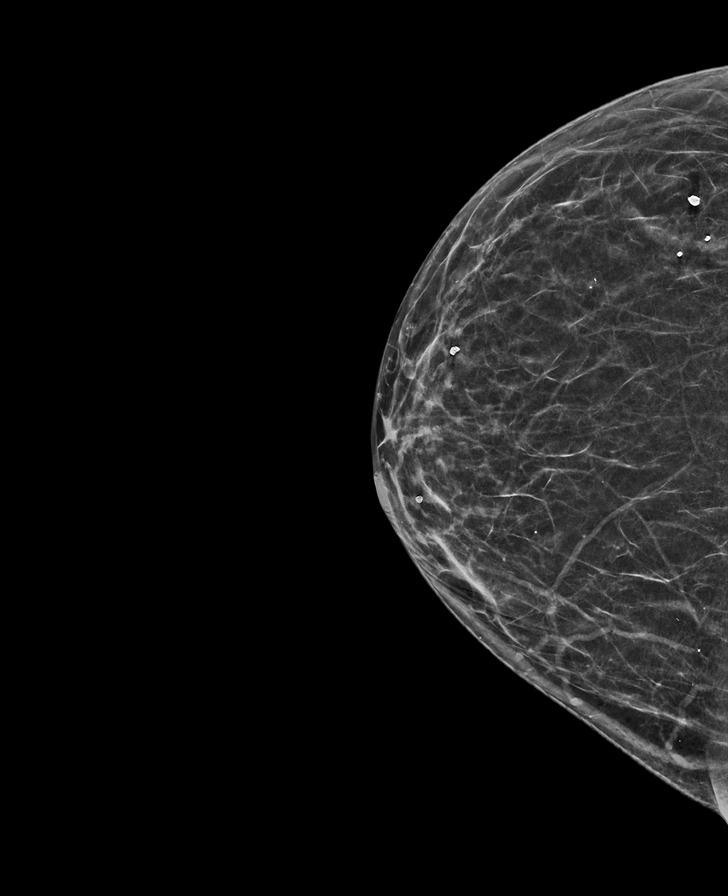

[L MLO synth-2D]
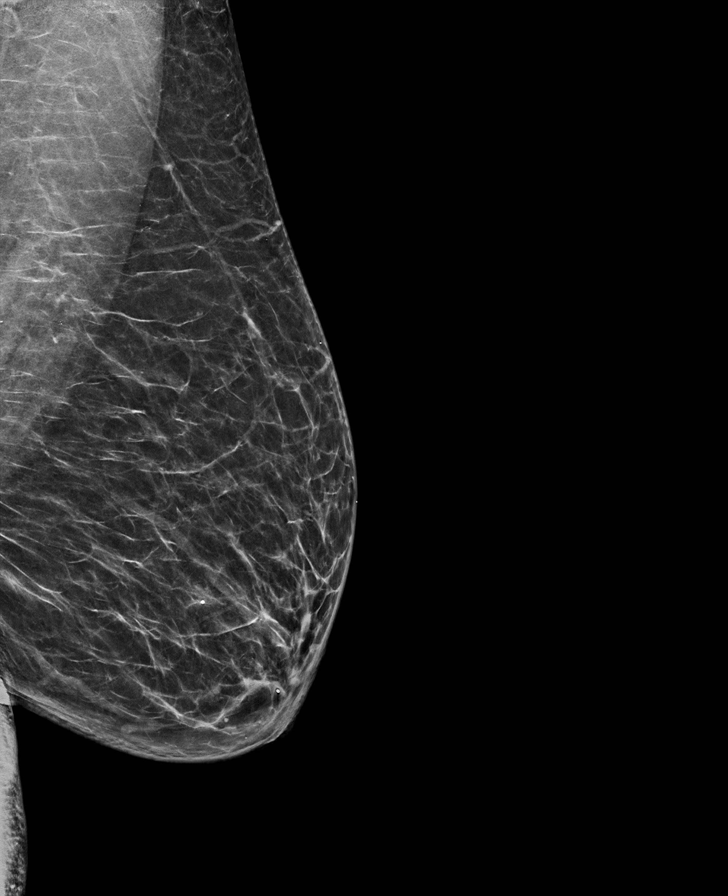

[R MLO synth-2D]
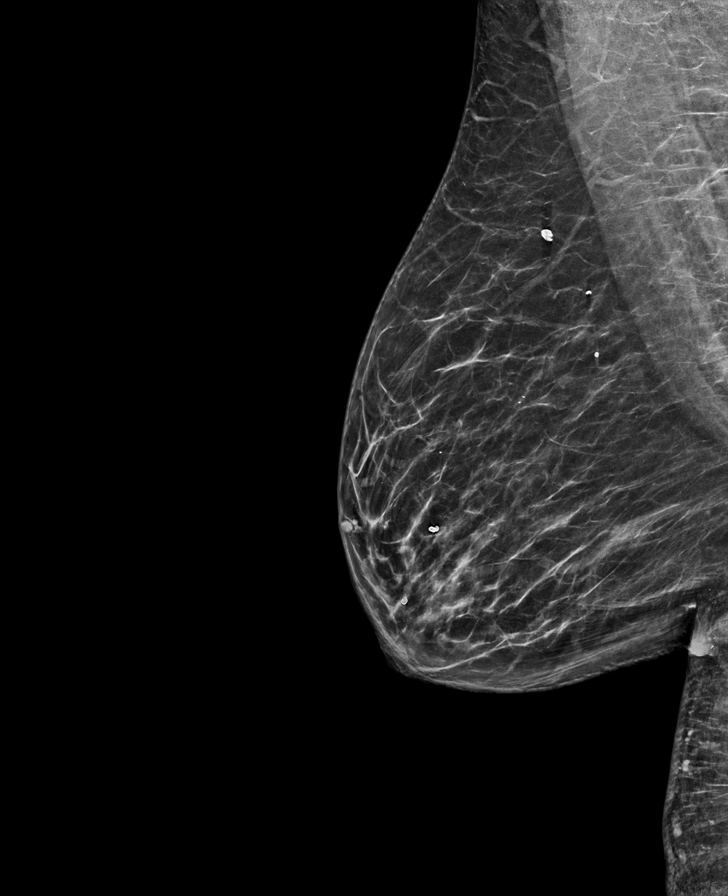

[L CC synth-2D]
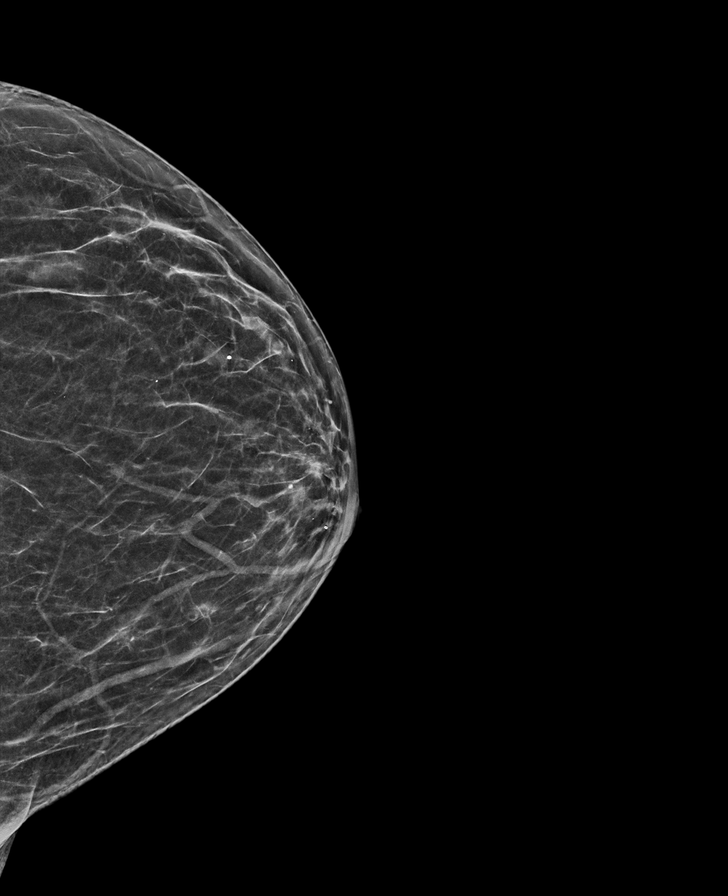

[R MLO tomo · tomo slice 33/66.0]
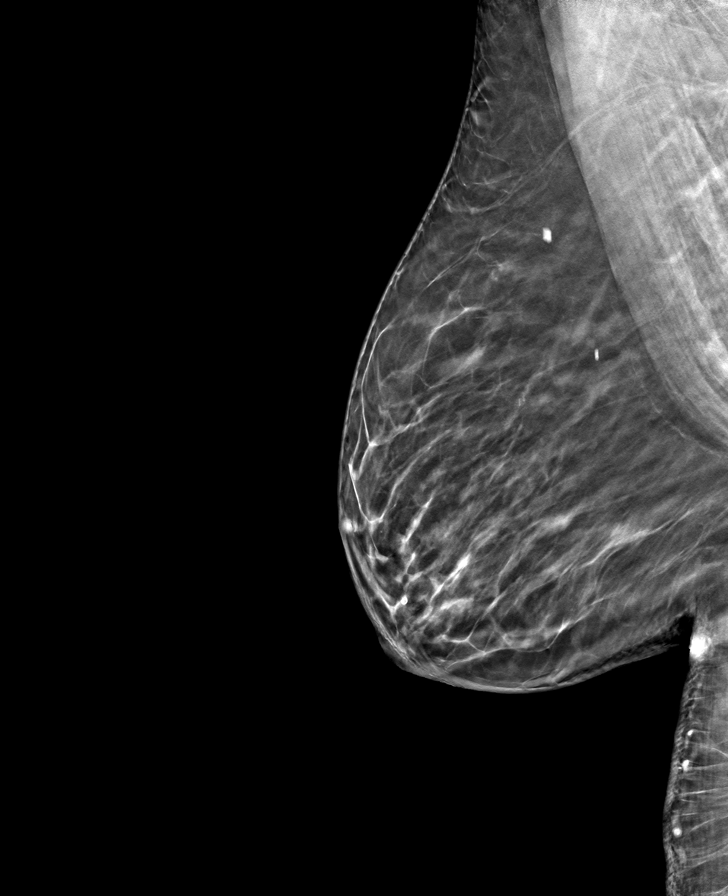

[L MLO tomo · tomo slice 34/67.0]
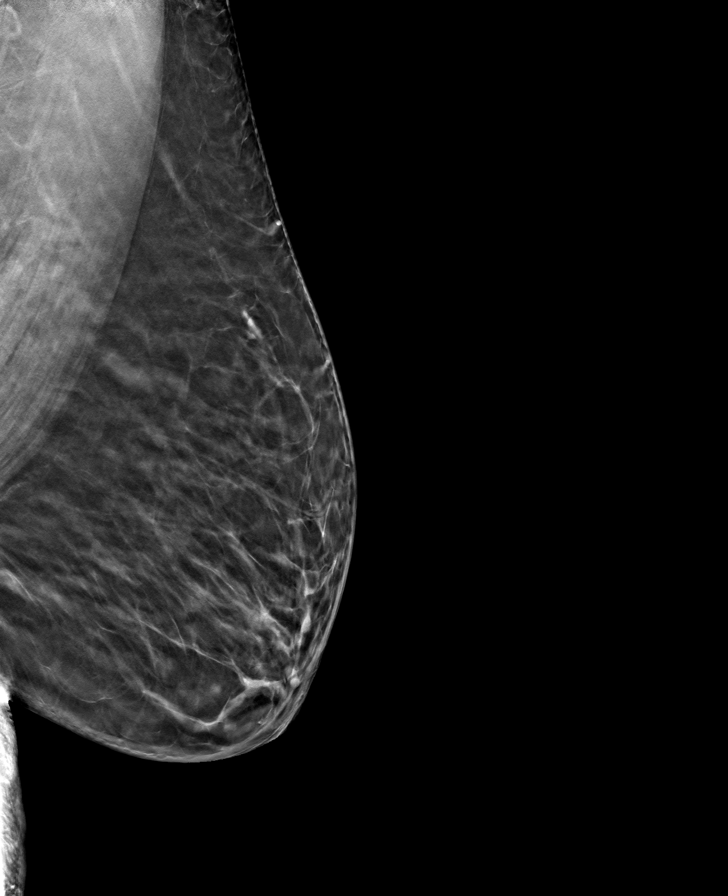

[L CC tomo · tomo slice 29/56.0]
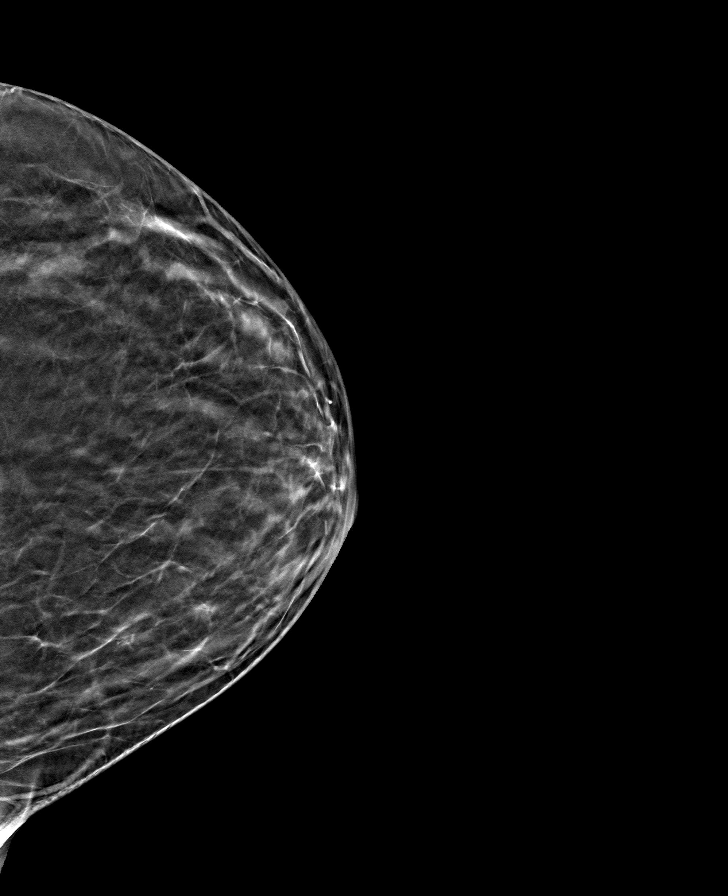

[R CC tomo · tomo slice 26/51.0]
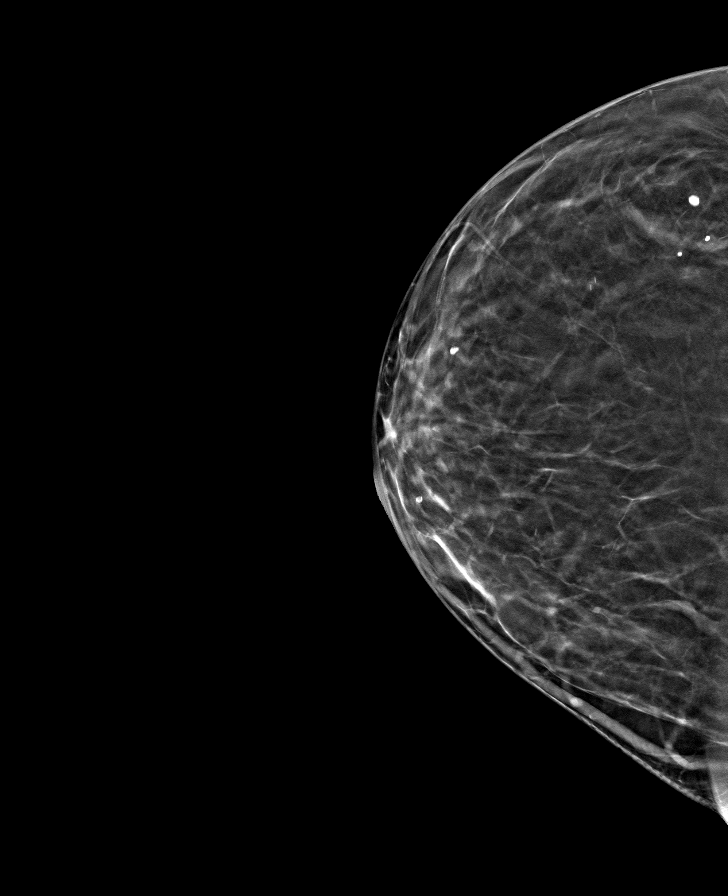

[8 of 24 positions shown; findings below may reference images not displayed]

ACR Breast Density Category b: There are scattered areas of
fibroglandular density.
FINDINGS: There are no findings suspicious for malignancy.
IMPRESSION: No mammographic evidence of malignancy. A result letter of this
screening mammogram will be mailed directly to the patient.

RECOMMENDATION:
Screening mammogram in one year. (Code:51-O-LD2)

BI-RADS CATEGORY  1: Negative.

## 2024-01-09 ENCOUNTER — Ambulatory Visit (INDEPENDENT_AMBULATORY_CARE_PROVIDER_SITE_OTHER): Admitting: Vascular Surgery

## 2024-01-09 ENCOUNTER — Encounter (INDEPENDENT_AMBULATORY_CARE_PROVIDER_SITE_OTHER): Payer: Self-pay | Admitting: Vascular Surgery

## 2024-01-09 VITALS — BP 95/62 | HR 99

## 2024-01-09 DIAGNOSIS — Z89612 Acquired absence of left leg above knee: Secondary | ICD-10-CM | POA: Diagnosis not present
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
## 2024-01-10 ENCOUNTER — Ambulatory Visit
Admission: RE | Admit: 2024-01-10 | Discharge: 2024-01-10 | Disposition: A | Discharge status: Home / Self Care | Source: Ambulatory Visit | Attending: Family Medicine | Admitting: Family Medicine

## 2024-01-10 DIAGNOSIS — Z1231 Encounter for screening mammogram for malignant neoplasm of breast: Secondary | ICD-10-CM | POA: Insufficient documentation

## 2024-01-17 ENCOUNTER — Other Ambulatory Visit: Payer: Self-pay | Admitting: Family Medicine

## 2024-01-17 DIAGNOSIS — R928 Other abnormal and inconclusive findings on diagnostic imaging of breast: Secondary | ICD-10-CM

## 2024-01-18 ENCOUNTER — Ambulatory Visit
Admission: RE | Admit: 2024-01-18 | Discharge: 2024-01-18 | Disposition: A | Discharge status: Home / Self Care | Source: Ambulatory Visit | Attending: Family Medicine | Admitting: Family Medicine

## 2024-01-18 DIAGNOSIS — R928 Other abnormal and inconclusive findings on diagnostic imaging of breast: Secondary | ICD-10-CM | POA: Insufficient documentation

## 2024-04-02 ENCOUNTER — Encounter (INDEPENDENT_AMBULATORY_CARE_PROVIDER_SITE_OTHER): Payer: Self-pay | Admitting: Vascular Surgery

## 2024-04-02 NOTE — Telephone Encounter (Signed)
 error
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
## 2024-05-21 ENCOUNTER — Encounter: Payer: Self-pay | Admitting: Cardiovascular Disease

## 2024-05-21 ENCOUNTER — Ambulatory Visit: Attending: Cardiovascular Disease | Admitting: Cardiovascular Disease

## 2024-05-21 VITALS — BP 120/70 | HR 96 | Ht 62.5 in | Wt 163.0 lb

## 2024-05-21 DIAGNOSIS — I70229 Atherosclerosis of native arteries of extremities with rest pain, unspecified extremity: Secondary | ICD-10-CM

## 2024-05-21 DIAGNOSIS — E1152 Type 2 diabetes mellitus with diabetic peripheral angiopathy with gangrene: Secondary | ICD-10-CM

## 2024-05-21 DIAGNOSIS — E782 Mixed hyperlipidemia: Secondary | ICD-10-CM | POA: Diagnosis not present

## 2024-05-21 DIAGNOSIS — I739 Peripheral vascular disease, unspecified: Secondary | ICD-10-CM

## 2024-05-21 DIAGNOSIS — Z794 Long term (current) use of insulin: Secondary | ICD-10-CM

## 2024-05-21 DIAGNOSIS — I70211 Atherosclerosis of native arteries of extremities with intermittent claudication, right leg: Secondary | ICD-10-CM | POA: Diagnosis not present

## 2024-05-21 DIAGNOSIS — T466X5S Adverse effect of antihyperlipidemic and antiarteriosclerotic drugs, sequela: Secondary | ICD-10-CM

## 2024-05-21 DIAGNOSIS — Z720 Tobacco use: Secondary | ICD-10-CM

## 2024-05-21 DIAGNOSIS — M791 Myalgia, unspecified site: Secondary | ICD-10-CM

## 2024-05-21 MED ORDER — REPATHA SURECLICK 140 MG/ML ~~LOC~~ SOAJ: 140.0000 mg | SUBCUTANEOUS | 3 refills | Status: AC

## 2024-05-21 NOTE — Patient Instructions (Addendum)
 Medication Instructions:   Repatha  140 mg sq once every 2 weeks  If you need a refill on your cardiac medications before your next appointment, please call your pharmacy.   Lab work: No new labs needed  Testing/Procedures: No new testing needed  Follow-Up: At [facility], you and your health needs are our priority.  As part of our continuing mission to provide you with exceptional heart care, we have created designated Provider Care Teams.  These Care Teams include your primary Cardiologist (physician) and Advanced Practice Providers (APPs -  Physician Assistants and Nurse Practitioners) who all work together to provide you with the care you need, when you need it.  You will need a follow up appointment in 12 months  Providers on your designated Care Team:   Lonni Meager, NP Bernardino Bring, PA-C Cadence [place], [place]  COVID-19 Vaccine Information can be found at: [email or web address] For questions related to vaccine distribution or appointments, please email [email or web address] or call [phone].

## 2024-05-22 ENCOUNTER — Telehealth: Payer: Self-pay | Admitting: Pharmacy Technician

## 2024-05-22 ENCOUNTER — Other Ambulatory Visit (HOSPITAL_COMMUNITY): Payer: Self-pay

## 2024-05-22 NOTE — Telephone Encounter (Signed)
   Pharmacy Patient Advocate Encounter   Received notification from CoverMyMeds that prior authorization for repatha  is required/requested.   Insurance verification completed.   The patient is insured through [place].   Per test claim: PA required; PA submitted to above mentioned insurance via Latent Key/confirmation #/EOC [id number] Status is pending

## 2024-05-22 NOTE — Telephone Encounter (Signed)
 Pharmacy Patient Advocate Encounter  Received notification from [employer] that Prior Authorization for repatha  has been APPROVED from 07/20/23 to 07/18/25. Per test claim copay is 0.00   PA #/Case ID/Reference #: [id number]

## 2024-07-23 ENCOUNTER — Other Ambulatory Visit: Payer: Self-pay | Admitting: Family Medicine

## 2024-07-23 DIAGNOSIS — R921 Mammographic calcification found on diagnostic imaging of breast: Secondary | ICD-10-CM

## 2024-08-03 ENCOUNTER — Ambulatory Visit
Admission: RE | Admit: 2024-08-03 | Discharge: 2024-08-03 | Disposition: A | Discharge status: Home / Self Care | Source: Ambulatory Visit | Attending: Family Medicine | Admitting: Family Medicine

## 2024-08-03 DIAGNOSIS — R921 Mammographic calcification found on diagnostic imaging of breast: Secondary | ICD-10-CM | POA: Diagnosis present

## 2024-08-09 ENCOUNTER — Other Ambulatory Visit: Payer: Self-pay | Admitting: Medical Genetics
